# Patient Record
Sex: Female | Born: 1935 | Race: Black or African American | Hispanic: No | Smoking: Never smoker
Health system: Southern US, Community
[De-identification: ages and names within clinical notes are randomized; demographics above are authoritative.]

## PROBLEM LIST (undated history)

## (undated) ENCOUNTER — Emergency Department (HOSPITAL_COMMUNITY): Discharge status: Against Medical Advice | Payer: PRIVATE HEALTH INSURANCE

## (undated) DIAGNOSIS — I10 Essential (primary) hypertension: Secondary | ICD-10-CM

## (undated) DIAGNOSIS — I82409 Acute embolism and thrombosis of unspecified deep veins of unspecified lower extremity: Secondary | ICD-10-CM

## (undated) DIAGNOSIS — D638 Anemia in other chronic diseases classified elsewhere: Secondary | ICD-10-CM

## (undated) DIAGNOSIS — F0390 Unspecified dementia without behavioral disturbance: Secondary | ICD-10-CM

## (undated) DIAGNOSIS — Z992 Dependence on renal dialysis: Secondary | ICD-10-CM

## (undated) DIAGNOSIS — M109 Gout, unspecified: Secondary | ICD-10-CM

## (undated) DIAGNOSIS — J45909 Unspecified asthma, uncomplicated: Secondary | ICD-10-CM

## (undated) DIAGNOSIS — M1990 Unspecified osteoarthritis, unspecified site: Secondary | ICD-10-CM

## (undated) DIAGNOSIS — E785 Hyperlipidemia, unspecified: Secondary | ICD-10-CM

## (undated) DIAGNOSIS — N186 End stage renal disease: Secondary | ICD-10-CM

## (undated) HISTORY — PX: AV FISTULA PLACEMENT: SHX1204

## (undated) HISTORY — DX: Essential (primary) hypertension: I10

## (undated) HISTORY — PX: CATARACT EXTRACTION W/ INTRAOCULAR LENS  IMPLANT, BILATERAL: SHX1307

## (undated) HISTORY — PX: TUBAL LIGATION: SHX77

## (undated) HISTORY — DX: Hyperlipidemia, unspecified: E78.5

---

## 1994-02-08 DIAGNOSIS — I517 Cardiomegaly: Secondary | ICD-10-CM

## 2000-06-27 ENCOUNTER — Emergency Department (HOSPITAL_COMMUNITY): Admission: EM | Admit: 2000-06-27 | Discharge: 2000-06-27 | Payer: Self-pay | Admitting: Emergency Medicine

## 2001-03-27 ENCOUNTER — Encounter: Payer: Self-pay | Admitting: Family Medicine

## 2001-03-27 ENCOUNTER — Ambulatory Visit (HOSPITAL_COMMUNITY): Admission: RE | Admit: 2001-03-27 | Discharge: 2001-03-27 | Payer: Self-pay | Admitting: Family Medicine

## 2001-03-30 ENCOUNTER — Ambulatory Visit (HOSPITAL_COMMUNITY): Admission: RE | Admit: 2001-03-30 | Discharge: 2001-03-30 | Payer: Self-pay | Admitting: Family Medicine

## 2001-04-24 ENCOUNTER — Other Ambulatory Visit: Admission: RE | Admit: 2001-04-24 | Discharge: 2001-04-24 | Payer: Self-pay | Admitting: Family Medicine

## 2001-11-02 ENCOUNTER — Emergency Department (HOSPITAL_COMMUNITY): Admission: EM | Admit: 2001-11-02 | Discharge: 2001-11-02 | Payer: Self-pay | Admitting: Emergency Medicine

## 2002-10-15 ENCOUNTER — Encounter: Payer: Self-pay | Admitting: Nephrology

## 2002-10-15 ENCOUNTER — Encounter: Admission: RE | Admit: 2002-10-15 | Discharge: 2002-10-15 | Payer: Self-pay | Admitting: Nephrology

## 2002-11-09 ENCOUNTER — Encounter: Payer: Self-pay | Admitting: Emergency Medicine

## 2002-11-10 ENCOUNTER — Inpatient Hospital Stay (HOSPITAL_COMMUNITY): Admission: EM | Admit: 2002-11-10 | Discharge: 2002-11-12 | Payer: Self-pay | Admitting: Emergency Medicine

## 2002-11-10 ENCOUNTER — Encounter: Payer: Self-pay | Admitting: Cardiology

## 2003-09-29 ENCOUNTER — Encounter (HOSPITAL_COMMUNITY): Admission: RE | Admit: 2003-09-29 | Discharge: 2003-12-28 | Payer: Self-pay | Admitting: Nephrology

## 2004-01-02 ENCOUNTER — Encounter (HOSPITAL_COMMUNITY): Admission: RE | Admit: 2004-01-02 | Discharge: 2004-04-01 | Payer: Self-pay | Admitting: Nephrology

## 2004-04-09 ENCOUNTER — Encounter (HOSPITAL_COMMUNITY): Admission: RE | Admit: 2004-04-09 | Discharge: 2004-07-08 | Payer: Self-pay | Admitting: Nephrology

## 2004-08-13 ENCOUNTER — Encounter (HOSPITAL_COMMUNITY): Admission: RE | Admit: 2004-08-13 | Discharge: 2004-11-11 | Payer: Self-pay | Admitting: Nephrology

## 2004-10-27 ENCOUNTER — Ambulatory Visit: Payer: Self-pay | Admitting: Family Medicine

## 2004-11-19 ENCOUNTER — Encounter (HOSPITAL_COMMUNITY): Admission: RE | Admit: 2004-11-19 | Discharge: 2005-02-17 | Payer: Self-pay | Admitting: Nephrology

## 2005-02-18 ENCOUNTER — Encounter: Admission: RE | Admit: 2005-02-18 | Discharge: 2005-05-19 | Payer: Self-pay | Admitting: Nephrology

## 2005-05-24 ENCOUNTER — Encounter (HOSPITAL_COMMUNITY): Admission: RE | Admit: 2005-05-24 | Discharge: 2005-08-22 | Payer: Self-pay | Admitting: Nephrology

## 2005-07-01 ENCOUNTER — Ambulatory Visit: Payer: Self-pay | Admitting: Family Medicine

## 2005-08-01 ENCOUNTER — Ambulatory Visit (HOSPITAL_COMMUNITY): Admission: RE | Admit: 2005-08-01 | Discharge: 2005-08-01 | Payer: Self-pay | Admitting: Internal Medicine

## 2005-11-04 ENCOUNTER — Encounter (HOSPITAL_COMMUNITY): Admission: RE | Admit: 2005-11-04 | Discharge: 2006-02-02 | Payer: Self-pay | Admitting: Nephrology

## 2006-01-02 ENCOUNTER — Ambulatory Visit: Payer: Self-pay | Admitting: Internal Medicine

## 2006-02-17 ENCOUNTER — Encounter (HOSPITAL_COMMUNITY): Admission: RE | Admit: 2006-02-17 | Discharge: 2006-03-29 | Payer: Self-pay | Admitting: Nephrology

## 2006-03-02 ENCOUNTER — Ambulatory Visit: Payer: Self-pay | Admitting: Family Medicine

## 2006-03-08 ENCOUNTER — Ambulatory Visit: Payer: Self-pay | Admitting: Family Medicine

## 2006-04-05 ENCOUNTER — Ambulatory Visit: Payer: Self-pay | Admitting: Internal Medicine

## 2006-04-13 ENCOUNTER — Ambulatory Visit: Payer: Self-pay | Admitting: Family Medicine

## 2006-04-14 ENCOUNTER — Encounter (HOSPITAL_COMMUNITY): Admission: RE | Admit: 2006-04-14 | Discharge: 2006-07-13 | Payer: Self-pay | Admitting: Nephrology

## 2006-04-17 ENCOUNTER — Encounter (INDEPENDENT_AMBULATORY_CARE_PROVIDER_SITE_OTHER): Payer: Self-pay | Admitting: Specialist

## 2006-04-17 ENCOUNTER — Inpatient Hospital Stay (HOSPITAL_COMMUNITY): Admission: EM | Admit: 2006-04-17 | Discharge: 2006-04-20 | Payer: Self-pay | Admitting: Emergency Medicine

## 2006-04-17 ENCOUNTER — Ambulatory Visit: Payer: Self-pay | Admitting: Hospitalist

## 2006-04-18 ENCOUNTER — Encounter (INDEPENDENT_AMBULATORY_CARE_PROVIDER_SITE_OTHER): Payer: Self-pay | Admitting: *Deleted

## 2006-05-08 ENCOUNTER — Ambulatory Visit: Payer: Self-pay | Admitting: Family Medicine

## 2006-06-12 ENCOUNTER — Ambulatory Visit: Payer: Self-pay | Admitting: Family Medicine

## 2006-06-20 ENCOUNTER — Ambulatory Visit: Payer: Self-pay | Admitting: Family Medicine

## 2006-07-17 ENCOUNTER — Ambulatory Visit: Payer: Self-pay | Admitting: Family Medicine

## 2006-07-21 ENCOUNTER — Encounter (HOSPITAL_COMMUNITY): Admission: RE | Admit: 2006-07-21 | Discharge: 2006-10-19 | Payer: Self-pay | Admitting: Nephrology

## 2006-09-04 ENCOUNTER — Ambulatory Visit: Payer: Self-pay | Admitting: Vascular Surgery

## 2006-09-10 ENCOUNTER — Inpatient Hospital Stay (HOSPITAL_COMMUNITY): Admission: EM | Admit: 2006-09-10 | Discharge: 2006-09-15 | Payer: Self-pay | Admitting: Emergency Medicine

## 2006-11-07 ENCOUNTER — Encounter (HOSPITAL_COMMUNITY): Admission: RE | Admit: 2006-11-07 | Discharge: 2007-02-05 | Payer: Self-pay | Admitting: Nephrology

## 2006-11-18 DIAGNOSIS — J45901 Unspecified asthma with (acute) exacerbation: Secondary | ICD-10-CM | POA: Insufficient documentation

## 2006-11-18 DIAGNOSIS — N19 Unspecified kidney failure: Secondary | ICD-10-CM | POA: Insufficient documentation

## 2006-11-18 DIAGNOSIS — M109 Gout, unspecified: Secondary | ICD-10-CM

## 2006-11-18 DIAGNOSIS — I10 Essential (primary) hypertension: Secondary | ICD-10-CM

## 2006-11-18 DIAGNOSIS — D649 Anemia, unspecified: Secondary | ICD-10-CM | POA: Insufficient documentation

## 2006-11-18 DIAGNOSIS — K5730 Diverticulosis of large intestine without perforation or abscess without bleeding: Secondary | ICD-10-CM | POA: Insufficient documentation

## 2006-11-28 ENCOUNTER — Ambulatory Visit (HOSPITAL_COMMUNITY): Admission: RE | Admit: 2006-11-28 | Discharge: 2006-11-28 | Payer: Self-pay | Admitting: Vascular Surgery

## 2006-11-28 ENCOUNTER — Ambulatory Visit: Payer: Self-pay | Admitting: Vascular Surgery

## 2007-01-08 ENCOUNTER — Ambulatory Visit: Payer: Self-pay | Admitting: Vascular Surgery

## 2007-03-02 ENCOUNTER — Encounter (HOSPITAL_COMMUNITY): Admission: RE | Admit: 2007-03-02 | Discharge: 2007-05-16 | Payer: Self-pay | Admitting: Nephrology

## 2007-03-19 ENCOUNTER — Encounter (INDEPENDENT_AMBULATORY_CARE_PROVIDER_SITE_OTHER): Payer: Self-pay | Admitting: Family Medicine

## 2007-04-30 ENCOUNTER — Encounter (INDEPENDENT_AMBULATORY_CARE_PROVIDER_SITE_OTHER): Payer: Self-pay | Admitting: Family Medicine

## 2007-05-21 ENCOUNTER — Ambulatory Visit: Payer: Self-pay | Admitting: Family Medicine

## 2007-05-21 DIAGNOSIS — K219 Gastro-esophageal reflux disease without esophagitis: Secondary | ICD-10-CM

## 2007-07-02 ENCOUNTER — Encounter (INDEPENDENT_AMBULATORY_CARE_PROVIDER_SITE_OTHER): Payer: Self-pay | Admitting: Family Medicine

## 2007-07-13 ENCOUNTER — Telehealth (INDEPENDENT_AMBULATORY_CARE_PROVIDER_SITE_OTHER): Payer: Self-pay | Admitting: Internal Medicine

## 2007-07-16 ENCOUNTER — Telehealth (INDEPENDENT_AMBULATORY_CARE_PROVIDER_SITE_OTHER): Payer: Self-pay | Admitting: Family Medicine

## 2007-07-18 ENCOUNTER — Ambulatory Visit: Payer: Self-pay | Admitting: Family Medicine

## 2007-07-18 DIAGNOSIS — J040 Acute laryngitis: Secondary | ICD-10-CM | POA: Insufficient documentation

## 2007-09-07 ENCOUNTER — Ambulatory Visit: Payer: Self-pay | Admitting: Vascular Surgery

## 2007-11-21 ENCOUNTER — Ambulatory Visit: Payer: Self-pay | Admitting: Family Medicine

## 2007-11-21 DIAGNOSIS — J309 Allergic rhinitis, unspecified: Secondary | ICD-10-CM | POA: Insufficient documentation

## 2008-02-26 ENCOUNTER — Encounter: Admission: RE | Admit: 2008-02-26 | Discharge: 2008-02-26 | Payer: Self-pay | Admitting: Nephrology

## 2008-04-29 ENCOUNTER — Ambulatory Visit: Payer: Self-pay | Admitting: Family Medicine

## 2008-09-08 ENCOUNTER — Ambulatory Visit: Payer: Self-pay | Admitting: Family Medicine

## 2008-09-11 ENCOUNTER — Encounter (INDEPENDENT_AMBULATORY_CARE_PROVIDER_SITE_OTHER): Payer: Self-pay | Admitting: Family Medicine

## 2008-09-25 ENCOUNTER — Encounter (HOSPITAL_COMMUNITY): Admission: RE | Admit: 2008-09-25 | Discharge: 2008-12-24 | Payer: Self-pay | Admitting: Nephrology

## 2008-10-09 ENCOUNTER — Ambulatory Visit: Payer: Self-pay | Admitting: Vascular Surgery

## 2008-10-14 ENCOUNTER — Ambulatory Visit (HOSPITAL_COMMUNITY): Admission: RE | Admit: 2008-10-14 | Discharge: 2008-10-14 | Payer: Self-pay | Admitting: Vascular Surgery

## 2008-10-14 ENCOUNTER — Ambulatory Visit: Payer: Self-pay | Admitting: Vascular Surgery

## 2009-02-04 ENCOUNTER — Encounter (INDEPENDENT_AMBULATORY_CARE_PROVIDER_SITE_OTHER): Payer: Self-pay | Admitting: Internal Medicine

## 2009-02-27 ENCOUNTER — Encounter (HOSPITAL_COMMUNITY): Admission: RE | Admit: 2009-02-27 | Discharge: 2009-05-28 | Payer: Self-pay | Admitting: Nephrology

## 2009-03-23 ENCOUNTER — Encounter: Payer: Self-pay | Admitting: Physician Assistant

## 2009-05-01 DIAGNOSIS — E785 Hyperlipidemia, unspecified: Secondary | ICD-10-CM

## 2009-05-18 ENCOUNTER — Encounter (HOSPITAL_COMMUNITY): Admission: RE | Admit: 2009-05-18 | Discharge: 2009-05-19 | Payer: Self-pay | Admitting: Nephrology

## 2009-06-11 ENCOUNTER — Ambulatory Visit: Payer: Self-pay | Admitting: Vascular Surgery

## 2009-07-15 ENCOUNTER — Ambulatory Visit: Payer: Self-pay | Admitting: Vascular Surgery

## 2010-01-15 ENCOUNTER — Encounter: Payer: Self-pay | Admitting: Physician Assistant

## 2010-05-07 ENCOUNTER — Ambulatory Visit: Payer: Self-pay | Admitting: Vascular Surgery

## 2010-08-01 ENCOUNTER — Encounter: Payer: Self-pay | Admitting: Occupational Therapy

## 2010-08-01 ENCOUNTER — Encounter: Payer: Self-pay | Admitting: Family Medicine

## 2010-08-12 NOTE — Letter (Signed)
Summary: MAILED REQUESTED RECORDS TO [facility]  MAILED REQUESTED RECORDS TO [facility]   Imported By: Arta Bruce 01/15/2010 12:13:45  _____________________________________________________________________  External Attachment:    Type:   Image     Comment:   External Document

## 2010-08-12 NOTE — Letter (Signed)
Summary: [facility]  [facility]   Imported By: Arta Bruce 07/19/2010 16:39:47  _____________________________________________________________________  External Attachment:    Type:   Image     Comment:   External Document

## 2010-10-19 LAB — FERRITIN: Ferritin: 337 ng/mL — ABNORMAL HIGH (ref 10–291)

## 2010-10-19 LAB — POCT HEMOGLOBIN-HEMACUE: Hemoglobin: 11.4 g/dL — ABNORMAL LOW (ref 12.0–15.0)

## 2010-10-19 LAB — IRON AND TIBC: Iron: 58 ug/dL (ref 42–135)

## 2010-10-20 LAB — POCT I-STAT 4, (NA,K, GLUC, HGB,HCT)
Glucose, Bld: 91 mg/dL (ref 70–99)
Potassium: 3.7 mEq/L (ref 3.5–5.1)

## 2010-10-21 LAB — FERRITIN: Ferritin: 466 ng/mL — ABNORMAL HIGH (ref 10–291)

## 2010-10-21 LAB — POCT HEMOGLOBIN-HEMACUE: Hemoglobin: 10.7 g/dL — ABNORMAL LOW (ref 12.0–15.0)

## 2010-10-21 LAB — IRON AND TIBC: UIBC: 163 ug/dL

## 2010-11-11 ENCOUNTER — Ambulatory Visit (INDEPENDENT_AMBULATORY_CARE_PROVIDER_SITE_OTHER): Payer: PRIVATE HEALTH INSURANCE | Admitting: Vascular Surgery

## 2010-11-11 DIAGNOSIS — N186 End stage renal disease: Secondary | ICD-10-CM

## 2010-11-12 NOTE — Assessment & Plan Note (Signed)
OFFICE VISIT  [name], [name] DOB:  05-27-1933                                       11/11/2010 [id number]#:[id number]  CHIEF COMPLAINT:  Tortuosity of left upper arm AV fistula.  HISTORY OF PRESENT ILLNESS:  The patient is a 77 year old female who initially had a left brachiocephalic AV fistula placed by Dr. Arbie Cookey in May of 2008.  She has had side branches ligated on this in April of 2010.  She currently is not on dialysis.  She was recently seen by Dr. Arrie Aran and he wanted further evaluation of the fistula due to its tortuosity.  The patient denies any numbness or tingling in her hand.  REVIEW OF SYSTEMS:  She has no itchiness in her skin suggesting uremia. PULMONARY:  She has no shortness of breath but does have occasional wheezing. CARDIAC:  She denies any chest pain.  PHYSICAL EXAM:  Blood pressure is 144/80 in the right arm, heart rate 67 and regular, oxygen saturation is 98% on room air.  Left upper arm, there is an easily palpable thrill and audible bruit in the fistula.  It does have some tortuosity to it.  The fistula is approximately 10-12 mm in diameter.  Left hand is pink, warm and well-perfused.  In summary, the patient has a mature left brachiocephalic AV fistula. At this point it does have some tortuosity to it.  However, I do not believe that this should prevent them from cannulating the fistula.  It should be ready for use at any time.  She will follow up on an as-needed basis.  If the tortuosity is a problem there is not really any procedure we can do to straighten the fistula.  We would have to consider a different access.  However, I believe this should be a durable access for her.    Janetta Hora. Ashni Lonzo, MD Electronically Signed  CEF/MEDQ  D:  11/11/2010  T:  11/12/2010  Job:  [id number]  cc:   Terrial Rhodes, M.D.

## 2010-11-23 NOTE — Procedures (Signed)
VASCULAR LAB EXAM   INDICATION:  Evaluate stenosis versus curve in left AVF.   HISTORY:  Diabetes:  No  Cardiac:  No  Hypertension:  Yes   EXAM:  Duplex left upper extremity AVF.   See attached worksheet for all measurements.   IMPRESSION:  1. Patent arteriovenous fistula with elevated velocities of 633 cm/s.      at the anastomosis.  2. Diameter measurements in the cephalic vein change from 0.49 cm near      anastomosis to 1.28 cm slightly further from the anastomosis.  3. Cephalic vein appears somewhat tortuous.   Results were discussed with Dr. Edilia Bo and an appointment scheduled to  see Dr. Darrick Penna.   ___________________________________________  Di Kindle. Edilia Bo, M.D.   AS/MEDQ  D:  06/11/2009  T:  06/11/2009  Job:  [id number]

## 2010-11-23 NOTE — Assessment & Plan Note (Signed)
OFFICE VISIT   [name], [name]  DOB:  October 12, 1932                                       10/09/2008  HWEXH#:[id number]   The patient is a 78 year old female who had a left brachiocephalic AV  fistula placed by Dr. Arbie Cookey in May 2008.  She is currently not on  hemodialysis but is approaching need for that.  She is sent by Dr.  Arrie Aran for evaluation of her left AV fistula and consideration for  ligation of a competing branch.   The fistula has never been used previously.  On exam today blood  pressure 143/77 in the right arm, pulse is 65 and regular.  Left upper  extremity shows an easily palpable thrill in the fistula.  There is a  large competing side branch in the proximal third, and this branch is  visible extending down the left forearm.  On compression of the branch,  the thrill diminishes slightly in the fistula and becomes slightly more  pulsatile.  This large collateral may have developed from some disease  within the vein itself.  However, this was not visualized on duplex  ultrasound today.  I believe the best option for her is to ligate this  branch and hopefully the fistula will continue to mature and be ready  for use if she needs it.  However, it could be possible that if we  ligate this branch she could end up losing the fistula.  Since it is not  adequate at this point for use for cannulation, I believe the best  option is to ligate the branch and we will see what happens.  This is  scheduled for October 14, 2008.   Janetta Hora. Fields, MD  Electronically Signed   CEF/MEDQ  D:  10/09/2008  T:  10/13/2008  Job:  [id number]   cc:   Terrial Rhodes, M.D.

## 2010-11-23 NOTE — Op Note (Signed)
[name], [name]              ACCOUNT NO.:  [id number]   MEDICAL RECORD NO.:  [id number]          PATIENT TYPE:  AMB   LOCATION:  SDS                          FACILITY:  [facility]   PHYSICIAN:  Larina Earthly, M.D.    DATE OF BIRTH:  16-Jan-1933   DATE OF PROCEDURE:  11/28/2006  DATE OF DISCHARGE:                               OPERATIVE REPORT   PREOPERATIVE DIAGNOSIS:  Renal insufficiency.   POSTOPERATIVE DIAGNOSIS:  Renal insufficiency.   PROCEDURE:  Creation of left upper arm AV fistula.   SURGEON:  Larina Earthly, M.D.   ASSISTANT:  Nurse.   ANESTHESIA:  MAC.   COMPLICATIONS:  None.   DISPOSITION:  Recovery room stable.   PROCEDURE IN DETAIL:  The patient was taken operating room placed supine  position where the area the left arm prepped and draped usual sterile  fashion.  Incision made over the antecubital space, carried down to  isolate the cephalic vein which was of good caliber.  The brachial  artery was also good caliber.  Cephalic vein was ligated distally,  divided, mobilized level of the brachial artery.  The brachial artery  was occluded proximally and distally, opened with 11 blade extended  longitudinally with Potts scissors.  The vein was brought into  approximation brachial artery.  The artery was opened with 11 blade  extended longitudinally with Potts scissors.  The vein was divided  spatulated sewn end-to-side the artery with running 6-0 Prolene suture.  Clamps removed.  Good thrill was noted.  Wound was irrigated with  saline, hemostasis electrocautery.  Wound was closed 3-0 Vicryl in  subcutaneous subcuticular tissue.  Benzoin and Steri-Strips were  applied.      Larina Earthly, M.D.  Electronically Signed     TFE/MEDQ  D:  11/28/2006  T:  11/28/2006  Job:  [id number]

## 2010-11-23 NOTE — Assessment & Plan Note (Signed)
OFFICE VISIT   SERRA, YOUNAN  DOB:  05-06-1933                                       09/07/2007  QIONG#:[id number]   The patient presents today for followup of her left upper arm AV  fistula.  She had upper arm AV fistula creation by me on 11/28/2006.  She is still not on hemodialysis.  She reported a tingling feeling in  her upper arm and Dr. Arrie Aran has referred her for evaluation.  She  has no steal symptoms.  Her hand is well-perfused and she does not have  any aching or numbness in her left hand.  Her renal function, according  to the patient, does remain stable, and she is not approaching need for  dialysis.   PHYSICAL EXAM:  Well-developed, well-nourished black female appearing  stated age of 48.  Blood pressure is 145/76, pulse 78, respirations 18.  Her radial pulse on the left is 2+.  She does have a nicely maturing  left upper arm AV fistula.  This is quite large at the antecubital space  and is large up to the upper arm.  It does run slightly deep in the  proximal upper arm, and has some tortuosity in the mid upper arm.  This  does appear that it will be quite accessible for access if she  approaches hemodialysis.  She does have 1 prominent tributary branch  over the radial aspect of her left forearm below the antecubital space.  I explained that this would be typical and does not appear to be causing  any competitive flow.  She was reassured with this discussion and will  see [place] again on an as needed basis.   Larina Earthly, M.D.  Electronically Signed   TFE/MEDQ  D:  09/07/2007  T:  09/10/2007  Job:  [id number]   cc:   Terrial Rhodes, M.D.

## 2010-11-23 NOTE — Procedures (Signed)
VASCULAR LAB EXAM   INDICATION:  Not maturing AV fistula, history of renal insufficiency.   HISTORY:  Diabetes:  Cardiac:  Yes.  Hypertension:  Yes.   EXAM:  Duplex of the left arm AV fistula.   IMPRESSION:  Patent left arm AV fistula with a competing branch noted at  the proximal AV fistula.   ___________________________________________  Janetta Hora. Fields, MD   MG/MEDQ  D:  10/09/2008  T:  10/09/2008  Job:  [id number]

## 2010-11-23 NOTE — Assessment & Plan Note (Signed)
OFFICE VISIT   [name], [name]  DOB:  28-Oct-1932                                       01/08/2007  QQVZD#:[id number]   The patient presents today for followup of her left upper arm AV fistula  creation on 11/28/2006.  Her incision has healed quite nicely and she  has an excellent thrill and good early maturation of her upper arm  cephalic vein.  She does report some fatigue in her hand with a great  deal of use of this, but does not appear to be a severe steal.  She will  monitor this and let us know if she has any progressive problems.  I am  quite pleased with her early result after AV fistula creation.  She is  to continue to be monitored by Dr. Arrie Aran for her renal  insufficiency.  Hopefully, we will have an adequate maturation if and  when time comes for hemodialysis.  She will see [place] on a p.r.n. basis.   Larina Earthly, M.D.  Electronically Signed   TFE/MEDQ  D:  01/08/2007  T:  01/09/2007  Job:  [id number]   cc:   Terrial Rhodes, M.D.

## 2010-11-23 NOTE — Assessment & Plan Note (Signed)
OFFICE VISIT   SHELIAH, FIORILLO  DOB:  07-19-1932                                       07/15/2009  FAOZH#:[id number]   The patient is a 77 year old female referred by Dr. Arrie Aran for  evaluation of a left upper arm AV fistula for possible stenosis.  The  patient is currently not on dialysis.  She originally had the fistula  placed in May of 2008.  She underwent a branch ligation of this fistula  for competing branches in April of 2010.  She states she has an  occasional episode of numbness in her thumb but with rubbing her hand  this resolves.  She otherwise reports no episodes of steal.  She states  that she can hear audible thrill in it when she is sleeping at night.   CHRONIC MEDICAL PROBLEMS:  Include hypertension and elevated cholesterol  both of which are controlled and followed by Dr. Arrie Aran.   REVIEW OF SYSTEMS:  Remarkable for occasional episodes of asthma and her  renal dysfunction.   PHYSICAL EXAM:  Blood pressure is 132/79 in the right arm, heart rate is  73 and regular.  Temperature is 97.8.  Left upper extremity reveals a  well-healed antecubital incision.  There is a palpable thrill in the  left upper arm fistula.  The fistula is quite tortuous but has dilated  up significantly.  The branch ligation sites are all well-healed as  well.   She had a duplex ultrasound performed on December 2 which showed a  velocity of 633 cm/s across the anastomosis, however, the thrill across  this is easily palpable and this is most likely due to high flow through  the fistula rather than a narrowing.  The diameter of the fistula in the  area that would be used for cannulation is between 1.2 cm and 1 cm in  diameter.   Currently the patient is not on hemodialysis.  After branch ligation the  fistula has increased in size and is now essentially 1 cm or greater in  diameter throughout its course.  There is no obvious evidence of mid  fistula or  distal fistula narrowing by duplex.  There was increased  velocity proximally, however, I do not believe that this represents a  narrowing based on her clinical exam.  She is currently not on dialysis.  She will follow up on an as-needed basis.     Janetta Hora. Fields, MD  Electronically Signed   CEF/MEDQ  D:  07/15/2009  T:  07/16/2009  Job:  [id number]   cc:   Terrial Rhodes, M.D.

## 2010-11-23 NOTE — Op Note (Signed)
NAMEBLESSEN, [name]              ACCOUNT NO.:  [id number]   MEDICAL RECORD NO.:  [id number]          PATIENT TYPE:  AMB   LOCATION:  SDS                          FACILITY:  [facility]   PHYSICIAN:  Charles E. Fields, MD  DATE OF BIRTH:  Nov 14, 1932   DATE OF PROCEDURE:  10/14/2008  DATE OF DISCHARGE:  10/14/2008                               OPERATIVE REPORT   PROCEDURE:  Ligation of competing branches, left upper arm arteriovenous  fistula.   PREOPERATIVE DIAGNOSIS:  Nonmaturing arteriovenous fistula, left arm.   POSTOPERATIVE DIAGNOSIS:  Nonmaturing arteriovenous fistula, left arm.   ANESTHESIA:  Local with IV sedation.   ASSISTANT:  Nurse.   OPERATIVE FINDINGS:  Ligation of 2 competing branches, one in the  proximal third of the fistula and one in the middle third of the  fistula.   OPERATIVE DETAILS:  After obtaining informed consent, the patient was  taken to the operating room.  The patient was placed in supine position  on the operating room table.  After adequate sedation, the patient's  entire left upper extremity was prepped and draped in usual sterile  fashion.  Ultrasound was used to determine the precise location of 2  competing branches.  One of these was in the proximal third of the  fistula and one was in the middle third of the fistula.  These were  marked for identification.  Local anesthesia was then infiltrated over  the area of both of these.  A longitudinal incision was made in this  location and carried down through subcutaneous tissues and the side  branch was dissected free circumferentially.  This was then ligated and  between silk ties.  In the upper arm, middle third of the fistula, an  additional longitudinal incision was made and carried down through the  subcutaneous tissues down to level of the fistula.  Side branch was  dissected free circumferentially and ligated and divided between two 2-0  silk ties.  Wounds were thoroughly irrigated with normal  saline  solution.  Subcutaneous tissues were reapproximated and skin  reapproximated using a running 4-0 Vicryl subcuticular stitch.  Benzoin  and Steri-Strips were applied.  The patient tolerated the procedure  well, and there were no complications.  Instrument, sponge, and needle  counts were correct at the end of the case.  The patient had palpable  thrill in the fistula at the end of the case.      Janetta Hora. Fields, MD  Electronically Signed     CEF/MEDQ  D:  10/14/2008  T:  10/15/2008  Job:  [id number]

## 2010-11-23 NOTE — Procedures (Signed)
VASCULAR LAB EXAM   INDICATION:  Duplex of AV fistula, rule out venous outflow stenosis.   HISTORY:  Diabetes:  No.  Cardiac:  No.  Hypertension:  Yes.   EXAM:  Left upper extremity AV fistula duplex.   IMPRESSION:  1. Patent left brachial cephalic arteriovenous fistula with a velocity      of 656 cm/sec noted in the antecubital fossa level of the outflow      vein.  This increase in velocity appears to be due to the angle of      vessel takeoff.  2. Marked tortuosity of the left outflow vein with diffuse mild      chronic post-thrombotic scarring noted in the proximal to distal      brachium level outflow vein.  3. 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  The left radial artery is      antegrade.  4. No significant change in the Doppler velocities noted when compared      to the previous exam on 06/11/2009.   ___________________________________________  Larina Earthly, M.D.   CH/MEDQ  D:  05/10/2010  T:  05/10/2010  Job:  [id number]

## 2010-11-26 NOTE — Discharge Summary (Signed)
[name], [name]              ACCOUNT NO.:  [id number]   MEDICAL RECORD NO.:  [id number]          PATIENT TYPE:  INP   LOCATION:  6708                         FACILITY:  [facility]   PHYSICIAN:  Thereasa Solo, M.D.  DATE OF BIRTH:  09-02-1932   DATE OF ADMISSION:  04/17/2006  DATE OF DISCHARGE:  04/20/2006                                 DISCHARGE SUMMARY   DISCHARGE DIAGNOSES:  1. Acute on chronic renal failure - likely secondary to hypotension, with      ACE inhibitor.  2. Hypertension.  3. Anemia of chronic disease.  4. Chronic renal insufficiency.  5. Status post tubal ligation ETO 1973.  6. Status post cath in 2004 showed no significant coronary artery disease      with EF of 45-50%.   DISCHARGE MEDICATIONS:  1. Hectoral 1 mg p.o. q. a.m.  2. Tramadol 50 mg p.o. b.i.d. p.r.n. pain.  3. Prilosec 20 mg every day p.o.  4. Phenergan 25 mg p.o. b.i.d. p.r.n. nausea.  5. Albuterol inhaler 1-2 puffs p.r.n. dyspnea.   FOLLOWUP APPOINTMENTS:  1. With primary care Deepak Bless Dr. Luciana Axe at [facility].  The patient is      to follow up October 29th at 9:45 a.m.  2. With Dr. Arrie Aran at [facility], to follow up October 18th at      3:45 p.m.  On these follow up visits, please check a BMET and CBC.  The patient had  some marked creatinine increase during this admission, which was most likely  due to hypertension in the face of an ACE inhibitor.  Please also check  blood pressure to determine if it is stable at this time.  Please also check  hemoglobin.  The patient has some anemia of chronic disease.   PROCEDURES PERFORMED:  1. On October 8th, 2007, there was an ultrasound of the kidneys performed,      which showed:      a.     multiple bilateral renal cyst with hyperechoic kidneys.  If this       patient is on dialysis, this may represent change of dialysis.      b.     No hydronephrosis.  2. X-ray of bilateral feet taken October 8th, 2007.      a.     Mild soft tissue  prominence of both feet, without focal       collection to suggest abscess.  This may represent lower extremity       edema.      b.     Small bilateral plantar calcaneal spurs.  3. Cytology examination of urine for eosinophils.  No eosinophils were      noted.  4. October 9th, 2007 - 2D echocardiogram.  Summary:      a.     Overall left ventricular systolic function was normal.  Although       no diagnostic left ventricular regional wall motion abnormality was       identified, this possibly cannot be clearly excluded on the basis of       this study.  b.     The aortic valve was mildly calcified.      c.     There was mild-to-moderate pulmonic regurgitation.  (There was       no ejection fraction reported on this echocardiogram report.)   CONSULTATIONS:   Kidney with Dr. Arrie Aran and Dr. Darrick Penna.   ADMITTING HMP:  This is a 77 year old female with past medical history  significant for hypertension, anemia, and chronic renal insufficiency  presents to the Emergency Department complaining of bilateral foot pain,  with the left greater than right for the past 2 weeks.  The pain was  immediate onset upon awakening 2 weeks prior to admission, with worsening  pain.  The patient has been seen by her PCP, who prescribed a gout medicine  (which was later determined to be allopurinol), which did not seem to  decrease the patient's pain over the 3 days she took the pill.  The patient  is ambulating with a cane, which is normal for her, but she is having some  difficulty due to the bilateral feet pain.  The patient has also noticed  this pain before, 2 years prior, which self-resolved after 2 days.   The patient has also noticed some increased edema in her bilateral feet that  began 2 weeks prior to arrival and has improved slightly, but still worse  than baseline.  Some friends told her to decrease her fluid intake, which  would help her get rid of some of the fluid on her  lower extremities, and  she has decreased her fluid intake over the past 2 weeks.   No chest pain or shortness of breath.  No fever or chills.  No nausea,  vomiting, diarrhea, constipation.  No history of trauma.  No dysuria.  No  change in urination, except a slight decrease in frequency.   ALLERGIES:  NO KNOWN DRUG ALLERGIES.   HOME MEDICATIONS:  1. Tramadol 50 mg p.o. every 8 p.r.n.  2. Hectoral 1 mg p.o. q. a.m.  3. Prilosec 20 mg p.o. every day.  4. Phenergan 25 mg p.o. every 8 hours p.r.n.  5. Albuterol 1-2 puffs q.h.s. p.r.n.  6. Augmentin 875 mg every 8 hours for 10 days, which the patient claims      was for a upset stomach.  (This was later determined to be a possible      bout of diverticulosis.)  7. Epogen every month per renal.   SUBSTANCE HISTORY:  No smoking history.  No alcohol.  No cocaine.  No IV  drug use.   SOCIAL HISTORY:  The patient is divorced, with Medicaid.  Lives with her  daughter and 3 grandchildren here in [place].   FAMILY HISTORY:  The patient is unsure of family history, although there is  no history of heart disease, no history of cancer, no history of kidney  disease, per patient.   PHYSICAL EXAM:  VITAL SIGNS:  Temperature 97.4, blood pressure 82/56, pulse  95, respiratory 16, saturating 97% on room air.  GENERAL:  No acute distress.  The patient is comfortable and mentating  appropriating.  EYES:  Extraocular muscles intact.  Pupils equally round and reactive to  light and accommodation.  Sclerae anicteric.  ENT:  Oropharynx clear.  Moist mucous membranes.  NECK:  No JVD.  Full range of motion.  Supple.  RESPIRATORY:  Clear to auscultation bilaterally with good breath sounds.  No  rubs, rhonchi or wheezes.  CARDIOVASCULAR:  Regular rate and rhythm,  without murmur.  No JVD.  No  carotid bruits.  GI:  Soft, nontender, nondistended.  Positive bowel sounds.  No  hepatosplenomegaly. EXTREMITIES:  1+ edema bilaterally, 2+ pulses bilateral  low extremities.  Mild bruising on the dorsal surface of bilateral feet.  No warmth or  erythema noted on either of the first metatarsals.  SKIN:  No rash.  Skin is warm and dry.  LYMPH:  No supraclavicular or clavicular lymphadenopathy.  MUSCULOSKELETAL:  Full range of motion times all 4 extremities, except a  mild decrease in dorsiflexion on the left foot.  NEURO:  Cranial nerves II-XII clearly intact.  Sensation intact to light  touch in all 4 extremities, 5/5 strength in all 4 extremities.  Finger-to-  nose okay.  Gait untested due to low blood pressure.  PSYCH:  Alert and oriented to 3/3.  No suicidal or homicidal ideations.   LABS:  Sodium 136, potassium 4.0, chloride 108, BUN 82, creatinine 10.6,  glucose 82, white blood cell 8.3, hemoglobin 12.2, hematocrit 36, platelets  322.  Initial chest x-ray showed a mild cardiomegaly.  No edema.  Minor  bilateral basilar linear scarring/atelectasis.  Initial troponin was less  than 0.05.  UA:  Specific gravity 1.013, protein of 30, otherwise negative.  BNP 104, ferritin increased to 594, decreased iron at 41, decreased TIBC of  167.   PROBLEM:  1. Is the patient's chief complaint, bilateral foot pain, with the left      greater than right.  The pain has been lasting approximately 2 weeks      and worsening in severity without relief.  The patient was told by      primary care Reon Hunley that it may be gout.  Over the short duration of      medication that the patient did take, was not helpful in relieving the      patient's pain.  Her neuro exam is completely intact.  However,      musculoskeletal wise, there is some decreased range of motion in the      left foot as noted on physical exam.  There is no history of trauma,      and she does have 1+ edema in bilateral lower extremities.   We performed an x-ray of both of her feet and worked up edema on her as can  be seen on problem number 2.  For the x-ray of the feet, there was no   fracture or any other sign of trauma.  There was some signs of bone spurs,  which may be related to the patient's pain.   1. Elevated creatinine of 10.6.  Last known creatinine we have at time of      admission was 2.1, and another value of 2.5 on the last clinic visit      with [facility].  The patient is known to have a diagnosis of chronic      renal insufficiency due to the baseline creatinine in the mid to      superior.  She does Dr. Arrie Aran at [facility] and is followed      for this.  She does take Epogen once a month and is also on Hectoral      for secondary hypoparathyroidism.  We checked a FENA, urinalysis, urine      culture, ABG and BNP on this patient.  BNP was 98.  TSH 1.169.      Cortisol 11.7.  FENA was calculated to be 2.5.  There was also an      increased uric acid noted at 8.4.  At this point on admission, we are      considering several different options in our diagnosis.  However, the     most likely was a combination of the hypovolemia/hypotension, as      observed by the patient on admission, in combination with a recent use      of an ACE inhibitor, benazepril.  The combination of these 2 may have      caused an acute renal failure or acute tubal necrosis in this patient.      We also considered several other factors, even including AIN, acute      interstitial nephritis secondary to beta lactones due to the fact that      the patient was recently placed on amoxicillin for possible      diverticulosis.  We do not see any other causes as likely as these at      this time.   For treatment, we decided to hydrate the patient gently, after we found out  that the patient was not obstructed.  She did not have hydronephrosis  because we had considered the possibility of obstruction.  Therefore, we  started the patient on 75 mL an hour of D5 with 3 ounce bicarb.  This was  continued for approximately 24 hours, while the patient was maintaining good  p.o.  intake.  By the end of the second day of admission, the patient's  creatinine had dropped subsequently from 10.6 to 8.4 to 6.4 to 4.7 on the  day of discharge.  She was taking good p.o. intake, drinking plenty of  fluids, and she was feeling much better.   Thanks to [facility] for helping to see this patient while she was in the  hospital.  There were issues brought up about possible need for dialysis in  the future, and these will need to be considered on future follow up  appointments.   1. Hypotension.  Blood pressure was 82/56 on admission.  This subsequently      rose to 88/53, while in the Emergency Department.  By the next morning,      her blood pressure was 94/58, and by the time of discharge, the      patient's blood pressure had gone up to 114/62.  The patient was seen      by her primary care Verginia Toohey 4 days prior to admission and found to be      hypotensive there also, with approximately 80/50 blood pressure.  At      this time, she was discontinued on her benazepril.  We decided to wait      for renal ultrasound before hydrating this patient, to make sure she      was not obstructed, and once this was cleared, we began adequate      hydration on this patient, which also helped with her underlying kidney      disease that we were treating as well as noticed in problem number 2.      The echo did not reveal any abnormalities; however, it was not reported      to have an ejection fraction on the dictation.  With the patient's ABG,      she was slightly acidotic; however, this was most likely due to the      impaired kidney function, and it resolved subsequently over the course  of her stay with adequate hydration.   We did place the patient on stress dose cortisone at 50 mg IV every 6, which  was decreased gradually over the patient's stay.  She did have cortisol  measured at 11.7, which was a consideration considering there was a past use of steroids, namely prednisone, for  the gout episode.  We did not consider  that the patient was in any kind of septic shock at the moment and did not  place her on any antibiotics.   1. Hemoglobin of 12.2 on admission.  The patient was known to receive      Epogen once a month from her kidney doctors.  We did check iron      studies, which showed a decreased iron and decreased total iron-binding      capacity, with decreased ferritin and normal MCV, which was consistent      with anemia of chronic disease, most likely secondary to the patient's      impaired renal function.  Her hemoglobin did decrease during the course      of this stay to 10.4, then 10.0 and then back up to 10.2 on the day of      discharge.  We decided to follow her hemoglobin throughout her stay,      and this could be assessed more appropriately with the kidney doctors      on follow up soon.   1. Increased CBGs.  The patient did have some increased blood glucose      during this stay, with some in the 160s and 180s.  However, we believed      this was secondary to steroids, mainly the hydrocortisone, which was      administered during this stay for a stress dose.  She does not have a      history of diabetes; however, this may be something to follow up with      as an outpatient to determine if the patient does have elevated blood      sugars on a normal basis.   DISCHARGE LABS AND VITALS:  Sodium 143, potassium 3.4, (this was repleted  prior to patient discharge), chloride 108, bicarb 24, BUN 64, creatinine  4.7, glucose 96, white blood cell 13.5, (increased due to steroids),  hemoglobin 10.3, hematocrit 30.5, platelets 332, micro albumin and urine was  2.24, ferritin 612, PTH 132.4, calcium 9.5, potassium 5.1.  Urine culture  was negative.  Blood culture - I show no growth no date at time of  discharge.  Echo can be seen as above.      Thereasa Solo, M.D.  Electronically Signed     AS/MEDQ  D:  04/20/2006  T:  04/20/2006  Job:   [id number]   cc:   Jillyn Hidden A. Rankin, M.D.  Terrial Rhodes, M.D.

## 2010-11-26 NOTE — Discharge Summary (Signed)
[name], DELISI              ACCOUNT NO.:  [id number]   MEDICAL RECORD NO.:  [id number]          PATIENT TYPE:  INP   LOCATION:  6733                         FACILITY:  [facility]   PHYSICIAN:  Altha Harm, MDDATE OF BIRTH:  21-May-1933   DATE OF ADMISSION:  09/10/2006  DATE OF DISCHARGE:  09/15/2006                               DISCHARGE SUMMARY   DISPOSITION:  Home.   DISCHARGE DIAGNOSES:  1. Escherichia coli pyelonephritis.  2. Acute on chronic renal insufficiency.  3. End stage renal disease.  4. History of hypertension.  5. Anemia of chronic disease.  6. Status post tubal ligation.  7. Status post cardiac catheterization, with no intervention in 2004.   MEDICATIONS ON DISCHARGE:  1. Ciprofloxacin 500 mg daily x five days.  2. Hectorol 1 mcg p.o. daily.  3. Multivitamin one tablet p.o. daily.  4. Vicodin 5/500 one-half tablet p.o. every 4-6 hours p.r.n.  5. Lopressor 100 mg p.o. twice per day.   Please note that the patient's admission was on Prilosec; but, due to  her renal dysfunction, the Prilosec was discontinued by her admitting  physician.   CONSULTATIONS:  None.   PROCEDURES PERFORMED:  PICC line placement.   DIAGNOSTIC STUDIES:  Chest x-ray, portable, shows a PICC line tip in the  lower SVC.  No pneumothorax.   PRIMARY CARE PHYSICIAN:  Dr. Beverley Fiedler; nephrologist, Dr.  Arrie Aran.   CHIEF COMPLAINT:  Abdominal and back pain.   HISTORY OF PRESENT ILLNESS:  This is a 77 year old female with a history  of chronic renal insufficiency who presents with a one-day history of  right abdominal and back pain.  The patient also had fever, dysuria and  frequency of urination.  Please see history and physical dictated by Dr.  Luciana Axe for details of the History of the Present Illness.   HOSPITAL COURSE:  1. Pyelonephritis. The patient was admitted on started on      ciprofloxacin.  A urine was sent off for culture which revealed a      E. coli urinary  tract infection which was sensitive to      ciprofloxacin.  The patient will be continued on ciprofloxacin for      a total of 10 days for treatment of pyelonephritis.  2. Acute on chronic renal insufficiency:  The patient had an increase      in her creatinine from a baseline of 2.8 to a high of 4.95.  The      patient had her fluid restricted; and her creatinine is trending      downward progressively today, to a discharge creatinine of 2.61.      The patient is to continue her fluid restrictions and to follow up      with her nephrologist next week for further management of her renal      insufficiency.  Please note that the patient states that she had      been referred for placement of dialysis access, but did not follow      up with her appointment.  3. Arthritic pain:  The patient was  originally on Vicodin; however,      due to her increase of renal function, the Vicodin was      discontinued, and she was placed on Ultram.  The patient is being      discharged home with her usual dose of Vicodin one-half tablet      every 8 hours p.r.n.  4. Hypertension:  The patient had been on Prilosec for management of      hypertension.  However, it was discontinued by the admitting      physician.  During her hospitalization, the patient has had blood      pressures ranging from 129-136/73-75.  The patient's Prinivil will      be discontinued for now; and the patient will be started on      Lopressor 100 mg p.o. twice per day.   LABORATORY INVESTIGATIONS:  Pertinent laboratory studies on the day of  discharge:  The patient had a sodium of 141, potassium of 3.5, chloride  of 119, bicarbonate of 18, BUN of 26, creatinine of 2.61.  Please note  that her highest creatinine was 4.95 on March 3rd.  The patient had a  white blood cell count of 8.5, hemoglobin of 10.4, hematocrit of 30.5,  platelet count of 166,000.  The calcium was 8.7.   CONDITION ON DISCHARGE:  At the time of discharge, the  patient was  stable, vital signs were as follows:  Temperature 98, heart rate 79,  blood pressure 136/75, O2 saturations 97% on room air, respirations 15.  The patient's condition on discharge in stable.   FOLLOW-UP:  The patient is to follow up with her primary care physician  within seven days of discharge; and the patient is to follow up with her  nephrologist, Dr. Arrie Aran, early next week, either on Monday or  Tuesday.  I have recommended that the patient have her metabolic profile  reevaluated within four days to evaluate the trend of her renal  function.      Altha Harm, MD  Electronically Signed     MAM/MEDQ  D:  09/15/2006  T:  09/16/2006  Job:  [id number]   cc:   Fanny Dance. Rankins, M.D.

## 2010-11-26 NOTE — H&P (Signed)
[name], [name] NO.:  [id number]   MEDICAL RECORD NO.:  [id number]          PATIENT TYPE:  INP   LOCATION:  5731                         FACILITY:  [facility]   PHYSICIAN:  Gardiner Barefoot, MD    DATE OF BIRTH:  1933-05-22   DATE OF ADMISSION:  09/10/2006  DATE OF DISCHARGE:                              HISTORY & PHYSICAL   PRIMARY CARE PHYSICIAN:  Turkey R. Rankins, M.D.   CHIEF COMPLAINT:  Abdominal pain and back pain.   HISTORY OF PRESENT ILLNESS:  [name] is a 77 year old female with a  history of chronic renal insufficiency who presents here with a one day  history of right abdominal pain and back pain.  The patient states that  she has also had a fever at home, dysuria and increased frequency of  urination.  She also reports a decreased p.o. intake and decreased  appetite.  She was last hospitalized in October for acute renal failure  and her baseline creatinine has been in the 2 to 2.5 range.   PAST MEDICAL HISTORY:  1. Chronic renal insufficiency possibly secondary to ace inhibitor and      hypertension.  2. Hypertension.  3. Anemia of chronic disease.  4. Status post tubal ligation.  5. Status post cath with no intervention in 2004.   MEDICATIONS:  Hectorol 1 mg p.o. q.a.m.  Tramadol 50 mg p.o. b.i.d.  p.r.n.  Prilosec 20 mg p.o. daily.  Phenergan 25 mg p.o. b.i.d. p.r.n.  and Albuterol inhaler 1-2 puffs p.r.n.   ALLERGIES:  No known drug allergies.   SOCIAL HISTORY:  The patient has no history of alcohol, smoking or drug  use.   FAMILY HISTORY:  No known history of heart disease or kidney disease.   REVIEW OF SYSTEMS:  A complete 12 point review of systems was obtained  and was negative on the presenting history of present illness.   PHYSICAL EXAMINATION:  VITAL SIGNS:  Temperature 102.6, pulse 80,  respirations 16, blood pressure 80/55.  GENERAL:  The patient is awake, alert and oriented times 3 and appears  in no acute  distress.  CARDIOVASCULAR:  Regular rate and rhythm with no murmurs, rubs or  gallops.  LUNGS: Clear to auscultation bilaterally.  ABDOMEN:  Obese, mildly tender on the right side and flank CVA  tenderness and positive bowel sounds and no hepatosplenomegaly.  EXTREMITIES:  No cyanosis, clubbing or edema.  SKIN:  No rashes.   LABORATORY DATA:  Urinalysis is notable for positive leukocytes, 11/20  WBCs and many bacteria.  Negative nitrites.  Urine culture is pending.  CBC with a WBC of 9.9, neutrophils 92%.  Hemoglobin 12.2, platelets 231.  Sodium 143, potassium 3.4, chloride 114, glucose 165, BUN 36, creatinine  is 2.8.   ASSESSMENT:  1. Pyelonephritis, will have the patient admitted and start her on      Cipro and watch her blood pressure carefully.  We will follow up on      the urine sensitivities.  2. Hypotension, will continue the patient on intravenous fluids and      monitor  her blood pressure closely.  Will also hold any blood      pressure medicines for now.  3. Renal insufficiency.  We will gently hydrate her for now and      monitor her I&Os closely.  4. Anemia of chronic disease and the patient does get Epo injections      by her Nephrologist.   DISPOSITION:  The patient is a full code.      Gardiner Barefoot, MD  Electronically Signed     RWC/MEDQ  D:  09/10/2006  T:  09/11/2006  Job:  [id number]

## 2010-11-26 NOTE — Consult Note (Signed)
[name], [name]              ACCOUNT NO.:  [id number]   MEDICAL RECORD NO.:  [id number]          PATIENT TYPE:  INP   LOCATION:  6708                         FACILITY:  [facility]   PHYSICIAN:  James L. [name], M.D.DATE OF BIRTH:  1932-09-20   DATE OF CONSULTATION:  DATE OF DISCHARGE:                                   CONSULTATION   REQUESTING PHYSICIAN:  Teaching service.   REASON FOR CONSULTATION:  Acute on top of chronic kidney disease.   HISTORY OF PRESENT ILLNESS:  This is a 77 year old female with past medical  history significant for hypertension about 20-25 years, chronic kidney  disease with creatinine 2004 being 1.9-2.1, and in June 2007 was 2.6, and  there was a reported verbal value of 2.3 on April 06, 2006, but I do not  have that documented.  She has a history of feeling weak, and decreased  appetite about 2 weeks.  She had a flare up of foot pain, thought to be gout  on the left and was treated with a gout medicine for that.  Last Tuesday,  was not feeling good, that was stopped and benazepril was also stopped.  Her  blood pressure was relatively low.  Creatinine was 10 on Friday and was 10.6  today.  She stated she has felt weak, poor appetite, hungry but just does  not want to eat.  She has some chills and fevers but she has not taken her  temperatures.  She has noticed no itchings, she does have some muscle  cramping, she sleeps on 2 pillows, __________ PND, nocturia x1-2, no chest  pain, cough, back pain, hematuria, dysuria, no history of stones, history of  urinary tract infections, and denies non steroidal ingestion.   She has had no shortness of breath or cough.  No diarrhea or vomiting.  She  has no known drug allergies.   PAST MEDICAL HISTORY:  Her past medical history includes the above with  hypertension 20-25 years, asthma, gout, chronic kidney disease.   MEDICATIONS:  Medications include:  1. Albuterol 2 puffs p.r.n.  2. Tramadol 50 mg  p.r.n.  3. Benazepril 10 mg a day.  4. Skin cream each day.  5. Omeprazole 20 mg a day.  6. Hectorol 1 mcg a day.  7. Aranesp 100 mcg every other week.  8. Ketoconazole cream b.i.d.  9. Nystatin cream b.i.d.  10.Tandem 1 a day.  11.Triamcinolone 0.1% b.i.d. also.   FAMILY HISTORY:  Father died at age 51 of bone cancer.  Mother died at age  46 from skin cancer.  Has 2 brothers alive, 3 brothers who died, 1 from  cancer, 1 from motor vehicle accident, 1 shortly after World War II of  unclear etiology.  5 sisters, all alive.  Family history is significant for  hypertension, diabetes, and cancer.  No family history of renal disease.  She has 11 children, she is divorced.  One of her children was murdered.  She has no tobacco abuse history, some alcohol ingestion.  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  REVIEW OF SYSTEMS:  As listed above.  Just not feeling good, decreased  appetite, no headaches, visual difficulties, sores or dried mouth.  PULMONARY:  No coughs, sputum production, or wheezing.  GI:  No indigestion, heartburn, diarrhea.  No history of hepatitis, yellow  jaundice.  CARDIAC:  As above.  Negative.  SKIN:  She denies any problems.  MUSCULOSKELETAL:  She had pain over her left foot, on the top of her left  foot, over the 3rd and 4th toes.  NEUROLOGICAL:  Negative.   OBJECTIVE:  Physical exam:  Very pleasant, elderly female who appears  healthier than stated problems.  Blood pressure 82/56, temp 97.4, heart rate  95, respiratory rate of 16.  In general, she is no acute distress.  HEENT:  Is benign.  NECK:  Unremarkable.  No masses or thyromegaly.  LUNGS:  Reveal no rales, rhonchi, or wheezes.  No percussional __________ ,  normal breath sounds.  CARDIOVASCULAR:  Regular rhythm, grade 2/6 holosystolic murmur best heart at  left sternal border, PMI is 10 cm __________ .  ABDOMEN:  Positive bowel sounds.  Liver is down 2 centers, non  tender.  No  masses felt.  Somewhat obese.  No significant __________ , axillary,  supraclavicular or cervical adenopathy.  SKIN:  Without lesions.  MUSCULOSKELETAL:  Left foot is tender with 3rd and 4th metatarsals, some  edema in that area, and mild erythema and warmth.  NEUROLOGICAL EXAM:  Cranial nerves 2-12 grossly intact.  Motor is 5/5  symmetric, deep ten reflexes are 2+/4+.   LABORATORY DATA:  Hemoglobin 11.3, white count of 8300, platelets 322,000,  2% eosinophils in her differential.  Urinalysis 30 mg with __________ %  protein, 0-2 red cells, 0-2 white cells.  Chemistry:  Sodium 136, potassium  4.0, chloride 108, bi carb of 18, creatinine 10.6, BUN of 82, glucose of 82.   ASSESSMENT:  1. Chronic kidney disease with acute kidney injury of top of it:  The      etiology of the acute kidney injury is most likely hemodynamic.      Decreased blood pressure causing decreased renal blood flow.      Questionable other insults such as myocardial infarction, infection,      obstruction, or other types of insult.  Why the blood pressure is      decreased now is not real clear.  Need to increase the blood pressure      at this time, volume expander, see if her creatinine responds.  And      evaluate her for further causes.  Close to needing dialysis at this      point.  2. Anemia:  Getting EPO once a month.  3. __________ hyperparathyroidism:  On vitamin D.  4. Hypertension:  She is actually hypotensive.  5. History of gout:  Check uric acid.   PLAN:  1. Ultrasound.  2. Urine to sodium creatinine.  3. IV fluids.  4. EPO on iron.  5. Uric acid.          ______________________________  [name]. [name], M.D.    JLD/MEDQ  D:  04/17/2006  T:  04/18/2006  Job:  [id number]

## 2010-11-26 NOTE — Cardiovascular Report (Signed)
NAME:  [name], [name]                        ACCOUNT NO.:  [id number]   MEDICAL RECORD NO.:  [id number]                   PATIENT TYPE:  INP   LOCATION:  3743                                 FACILITY:  [facility]   PHYSICIAN:  Darlin Priestly, M.D.             DATE OF BIRTH:  [birth year]   DATE OF PROCEDURE:  11/11/2002  DATE OF DISCHARGE:                              CARDIAC CATHETERIZATION   PROCEDURES PERFORMED:  1. Left heart catheterization.  2. Coronary angiography.  3. Left ventriculogram.   CARDIOLOGIST:  Darlin Priestly, M.D.   COMPLICATIONS:  None.   INDICATIONS FOR PROCEDURE:  The patient is a 77 year old female, patient of  Dr. Yates Decamp and [facility] with a history of asthma and renal  insufficiency followed by Dr. Arrie Aran who was admitted on Nov 10, 2002  with chest pain and shortness of breath.  She is now referred for cardiac  catheterization to assess her coronary status.   DESCRIPTION OF PROCEDURE:  After obtaining informed consent the patient was  brought to the cardiac cath lab.  The right and left groins were shaved,  prepped and draped in the usual sterile fashion.  ECG monitoring was  established.  Using the modified Seldinger technique a #6 French arterial  sheath was inserted into the right femoral artery.  Six French diagnostic  catheters were used to perform diagnostic angiography.   RESULTS:  Left Main Coronary Artery:  The left main coronary artery showed  no significant disease.   Left Anterior Descending:  The LAD is a large vessel, which courses the apex  and becomes a small tapered vessel at the apex.  There is no significant in  the LAD.  The is one medium size diagonal branch with no significant  disease.   Circumflex Artery:  The left circumflex is a medium size vessel, which  courses the AV groove and gives rise to without obtuse marginal branches.  The AV groove circ has no significant disease.  The first and second obtuse  marginals are medium size vessels, which bifurcate in the mid segment and  has no significant disease.   Right Coronary Artery:  The right coronary artery is a medium size vessel,  which gives rise to the PDA and RV marginal branch.  There is catheter  dampening at the ostium of the RCA, but no visible lesion is noted and this  is probably secondary to small vessel size.  There is no significant disease  in the RCA or PDA.   LEFT VENTRICULOGRAM:  Left ventriculogram was performed using 8 mL of  contrast via hand injection secondary to the renal insufficiency.  This  reveals mildly depressed EF of 45-50%.   HEMODYNAMIC DATA:  Systemic arterial pressure 129/66.  LV systemic pressure  120/7.  LVEDP of 17.    CONCLUSION:  1. No significant coronary artery disease.  2. Low normal ejection fraction.  Darlin Priestly, M.D.    RHM/MEDQ  D:  11/11/2002  T:  11/11/2002  Job:  [id number]   cc:   Cristy Hilts. Jacinto Halim, M.D.  [address]  [place]  [place] [address]  Fax: [phone]   [facility]

## 2010-11-26 NOTE — Discharge Summary (Signed)
   NAME:  [name], [name]                        ACCOUNT NO.:  [id number]   MEDICAL RECORD NO.:  [id number]                   PATIENT TYPE:  INP   LOCATION:  3743                                 FACILITY:  [facility]   PHYSICIAN:  Cristy Hilts. Jacinto Halim, M.D.                  DATE OF BIRTH:  November 02, 1932   DATE OF ADMISSION:  11/10/2002  DATE OF DISCHARGE:  11/12/2002                                 DISCHARGE SUMMARY   DISCHARGE DIAGNOSES:  1. Chest pain consistent with unstable angina on admission, normal     coronaries at catheterization this admission.  2. Mild left ventricular dysfunction with an ejection fraction of 45-50%.  3. Renal insufficiency, creatinine 2.1 at discharge.  4. Anemia, hemoglobin 9.9 at discharge.  5. History of asthma.   HOSPITAL COURSE:  The patient is a 77 year old female, followed at  [facility], who was admitted, Nov 10, 2002, with chest pain.  She had been  on Lotensin 5 mg a day.  She has a history of asthma.  The patient was  admitted to telemetry and started on Lovenox.  CK-MB and troponins were  negative for an MI.  She was set up for diagnostic cath, Nov 11, 2002.  This  revealed essentially normal coronaries with an EF of 45-50%.  We feel she  can be discharged Nov 12, 2002.  She will need a BMP and CBC on Friday after  discharge.  She will see Dr. Cristy Hilts. Ganji in followup.  Dr. Darlin Priestly also suggested that at some point we may want to consider lower  extremity arterial Dopplers as an outpatient.  She was anemic.  B12, folate  and iron levels were obtained prior to discharge.   DISCHARGE MEDICATIONS:  Nexium 40 mg a day.  We did not discharge her on  beta blocker because of her asthma.  We did suggest she take a multivitamin  with iron.   FOLLOWUP:  She will follow up with Dr. Jacinto Halim, Nov 26, 2002, at 10:45.   LABORATORY DATA AT DISCHARGE:  White count 5.9, hemoglobin 9.9, hematocrit  28.8, platelets 204,000.  Sodium 138, potassium 4.1, BUN 24,  creatinine 2.0.  INR 1.1.  Liver functions are normal.  Total cholesterol 169, HDL 53, LDL  105.   EKG reveals sinus rhythm, right bundle branch block.   Chest x-ray shows cardiomegaly without evidence of acute disease.     Abelino Derrick, P.A.                      Cristy Hilts. Jacinto Halim, M.D.    Lenard Lance  D:  11/12/2002  T:  11/13/2002  Job:  [id number]   cc:   Dala Dock

## 2011-04-22 LAB — IRON AND TIBC
Iron: 69
TIBC: 204 — ABNORMAL LOW
UIBC: 135

## 2011-04-22 LAB — CBC
RBC: 4.2
WBC: 5.7

## 2011-04-22 LAB — FERRITIN: Ferritin: 509 — ABNORMAL HIGH (ref 10–291)

## 2011-04-25 LAB — CBC
HCT: 35.5 — ABNORMAL LOW
Platelets: 178
RDW: 14.9 — ABNORMAL HIGH

## 2011-04-27 LAB — HEMOGLOBIN AND HEMATOCRIT, BLOOD
HCT: 35.1 — ABNORMAL LOW
Hemoglobin: 11.9 — ABNORMAL LOW

## 2011-04-27 LAB — IRON AND TIBC
Saturation Ratios: 31
TIBC: 221 — ABNORMAL LOW

## 2011-11-03 ENCOUNTER — Other Ambulatory Visit (HOSPITAL_COMMUNITY): Payer: Self-pay | Admitting: *Deleted

## 2011-11-08 ENCOUNTER — Encounter (HOSPITAL_COMMUNITY)
Admission: RE | Admit: 2011-11-08 | Discharge: 2011-11-08 | Disposition: A | Discharge status: Home / Self Care | Payer: PRIVATE HEALTH INSURANCE | Source: Ambulatory Visit | Attending: Nephrology | Admitting: Nephrology

## 2011-11-08 ENCOUNTER — Encounter (HOSPITAL_COMMUNITY): Payer: PRIVATE HEALTH INSURANCE

## 2011-11-08 DIAGNOSIS — D649 Anemia, unspecified: Secondary | ICD-10-CM | POA: Insufficient documentation

## 2011-11-08 MED ORDER — SODIUM CHLORIDE 0.9 % IV SOLN: INTRAVENOUS | Status: DC

## 2011-11-08 MED ORDER — FERUMOXYTOL INJECTION 510 MG/17 ML
510.0000 mg | INTRAVENOUS | Status: DC
  Administered 2011-11-08: 510 mg via INTRAVENOUS
  Filled 2011-11-08: qty 17

## 2011-11-14 ENCOUNTER — Encounter (HOSPITAL_COMMUNITY)
Admission: RE | Admit: 2011-11-14 | Discharge: 2011-11-14 | Disposition: A | Discharge status: Home / Self Care | Payer: PRIVATE HEALTH INSURANCE | Source: Ambulatory Visit | Attending: Nephrology | Admitting: Nephrology

## 2011-11-14 DIAGNOSIS — D649 Anemia, unspecified: Secondary | ICD-10-CM | POA: Insufficient documentation

## 2011-11-14 MED ORDER — FERUMOXYTOL INJECTION 510 MG/17 ML
510.0000 mg | INTRAVENOUS | Status: AC
  Administered 2011-11-14: 510 mg via INTRAVENOUS
  Filled 2011-11-14: qty 17

## 2011-11-14 MED ORDER — SODIUM CHLORIDE 0.9 % IV SOLN
INTRAVENOUS | Status: AC
  Administered 2011-11-14: 10:00:00 via INTRAVENOUS

## 2012-02-07 ENCOUNTER — Other Ambulatory Visit (HOSPITAL_COMMUNITY): Payer: Self-pay | Admitting: *Deleted

## 2012-02-08 ENCOUNTER — Inpatient Hospital Stay (HOSPITAL_COMMUNITY): Admission: RE | Admit: 2012-02-08 | Payer: PRIVATE HEALTH INSURANCE | Source: Ambulatory Visit

## 2012-02-21 ENCOUNTER — Encounter (HOSPITAL_COMMUNITY)
Admission: RE | Admit: 2012-02-21 | Discharge: 2012-02-21 | Disposition: A | Discharge status: Home / Self Care | Payer: PRIVATE HEALTH INSURANCE | Source: Ambulatory Visit | Attending: Nephrology | Admitting: Nephrology

## 2012-02-21 DIAGNOSIS — D649 Anemia, unspecified: Secondary | ICD-10-CM | POA: Insufficient documentation

## 2012-02-21 LAB — POCT HEMOGLOBIN-HEMACUE: Hemoglobin: 10 g/dL — ABNORMAL LOW (ref 12.0–15.0)

## 2012-02-21 MED ORDER — EPOETIN ALFA 20000 UNIT/ML IJ SOLN
20000.0000 [IU] | INTRAMUSCULAR | Status: DC
  Administered 2012-02-21: 20000 [IU] via SUBCUTANEOUS

## 2012-02-21 MED ORDER — EPOETIN ALFA 20000 UNIT/ML IJ SOLN
INTRAMUSCULAR | Status: AC
  Filled 2012-02-21: qty 1

## 2012-03-05 ENCOUNTER — Other Ambulatory Visit (HOSPITAL_COMMUNITY): Payer: Self-pay | Admitting: *Deleted

## 2012-03-06 ENCOUNTER — Encounter (HOSPITAL_COMMUNITY)
Admission: RE | Admit: 2012-03-06 | Discharge: 2012-03-06 | Disposition: A | Discharge status: Home / Self Care | Payer: PRIVATE HEALTH INSURANCE | Source: Ambulatory Visit | Attending: Nephrology | Admitting: Nephrology

## 2012-03-06 LAB — POCT HEMOGLOBIN-HEMACUE: Hemoglobin: 9.9 g/dL — ABNORMAL LOW (ref 12.0–15.0)

## 2012-03-06 MED ORDER — EPOETIN ALFA 20000 UNIT/ML IJ SOLN
20000.0000 [IU] | INTRAMUSCULAR | Status: DC
  Administered 2012-03-06: 20000 [IU] via SUBCUTANEOUS

## 2012-03-06 MED ORDER — EPOETIN ALFA 20000 UNIT/ML IJ SOLN
INTRAMUSCULAR | Status: AC
  Filled 2012-03-06: qty 1

## 2012-03-20 ENCOUNTER — Encounter (HOSPITAL_COMMUNITY)
Admission: RE | Admit: 2012-03-20 | Discharge: 2012-03-20 | Disposition: A | Discharge status: Home / Self Care | Payer: PRIVATE HEALTH INSURANCE | Source: Ambulatory Visit | Attending: Nephrology | Admitting: Nephrology

## 2012-03-20 DIAGNOSIS — D649 Anemia, unspecified: Secondary | ICD-10-CM | POA: Insufficient documentation

## 2012-03-20 LAB — RENAL FUNCTION PANEL
Albumin: 3.9 g/dL (ref 3.5–5.2)
Chloride: 103 mEq/L (ref 96–112)
GFR calc Af Amer: 10 mL/min — ABNORMAL LOW (ref >90)
GFR calc non Af Amer: 9 mL/min — ABNORMAL LOW (ref >90)
Phosphorus: 4.2 mg/dL (ref 2.3–4.6)
Potassium: 3.3 mEq/L — ABNORMAL LOW (ref 3.5–5.1)
Sodium: 141 mEq/L (ref 135–145)

## 2012-03-20 LAB — URINALYSIS, MICROSCOPIC ONLY
Glucose, UA: NEGATIVE mg/dL
Ketones, ur: NEGATIVE mg/dL
Leukocytes, UA: NEGATIVE
Nitrite: NEGATIVE
Protein, ur: NEGATIVE mg/dL
Urobilinogen, UA: 1 mg/dL (ref 0.0–1.0)

## 2012-03-20 LAB — HEPATITIS B SURFACE ANTIGEN: Hepatitis B Surface Ag: NEGATIVE

## 2012-03-20 LAB — CBC
HCT: 31.9 % — ABNORMAL LOW (ref 36.0–46.0)
Hemoglobin: 10.7 g/dL — ABNORMAL LOW (ref 12.0–15.0)
MCH: 29.2 pg (ref 26.0–34.0)
RBC: 3.67 MIL/uL — ABNORMAL LOW (ref 3.87–5.11)

## 2012-03-20 MED ORDER — EPOETIN ALFA 20000 UNIT/ML IJ SOLN
20000.0000 [IU] | INTRAMUSCULAR | Status: DC
  Administered 2012-03-20: 20000 [IU] via SUBCUTANEOUS
  Filled 2012-03-20: qty 1

## 2012-04-03 ENCOUNTER — Encounter (HOSPITAL_COMMUNITY)
Admission: RE | Admit: 2012-04-03 | Discharge: 2012-04-03 | Disposition: A | Discharge status: Home / Self Care | Payer: PRIVATE HEALTH INSURANCE | Source: Ambulatory Visit | Attending: Nephrology | Admitting: Nephrology

## 2012-04-03 MED ORDER — EPOETIN ALFA 20000 UNIT/ML IJ SOLN
INTRAMUSCULAR | Status: AC
  Filled 2012-04-03: qty 1

## 2012-04-03 MED ORDER — EPOETIN ALFA 20000 UNIT/ML IJ SOLN
20000.0000 [IU] | INTRAMUSCULAR | Status: DC
  Administered 2012-04-03: 20000 [IU] via SUBCUTANEOUS

## 2012-04-17 ENCOUNTER — Encounter (HOSPITAL_COMMUNITY)
Admission: RE | Admit: 2012-04-17 | Discharge: 2012-04-17 | Disposition: A | Discharge status: Home / Self Care | Payer: PRIVATE HEALTH INSURANCE | Source: Ambulatory Visit | Attending: Nephrology | Admitting: Nephrology

## 2012-04-17 DIAGNOSIS — D649 Anemia, unspecified: Secondary | ICD-10-CM | POA: Insufficient documentation

## 2012-04-17 LAB — CBC
MCH: 28.9 pg (ref 26.0–34.0)
MCHC: 32.7 g/dL (ref 30.0–36.0)
MCV: 88.1 fL (ref 78.0–100.0)
Platelets: 206 10*3/uL (ref 150–400)
RDW: 15.4 % (ref 11.5–15.5)

## 2012-04-17 LAB — IRON AND TIBC: TIBC: 243 ug/dL — ABNORMAL LOW (ref 250–470)

## 2012-04-17 LAB — URINALYSIS, MICROSCOPIC ONLY
Hgb urine dipstick: NEGATIVE
Leukocytes, UA: NEGATIVE
Nitrite: NEGATIVE
Specific Gravity, Urine: 1.01 (ref 1.005–1.030)
Urobilinogen, UA: 0.2 mg/dL (ref 0.0–1.0)

## 2012-04-17 LAB — RENAL FUNCTION PANEL
Albumin: 4 g/dL (ref 3.5–5.2)
Chloride: 104 mEq/L (ref 96–112)
Creatinine, Ser: 3.54 mg/dL — ABNORMAL HIGH (ref 0.50–1.10)
GFR calc non Af Amer: 11 mL/min — ABNORMAL LOW (ref >90)

## 2012-04-17 MED ORDER — EPOETIN ALFA 20000 UNIT/ML IJ SOLN
20000.0000 [IU] | INTRAMUSCULAR | Status: DC
  Administered 2012-04-17: 20000 [IU] via SUBCUTANEOUS

## 2012-04-17 MED ORDER — EPOETIN ALFA 20000 UNIT/ML IJ SOLN
INTRAMUSCULAR | Status: AC
  Filled 2012-04-17: qty 1

## 2012-04-18 LAB — PTH, INTACT AND CALCIUM
Calcium, Total (PTH): 10.7 mg/dL — ABNORMAL HIGH (ref 8.4–10.5)
PTH: 235.9 pg/mL — ABNORMAL HIGH (ref 14.0–72.0)

## 2012-05-02 ENCOUNTER — Encounter (HOSPITAL_COMMUNITY)
Admission: RE | Admit: 2012-05-02 | Discharge: 2012-05-02 | Disposition: A | Discharge status: Home / Self Care | Payer: PRIVATE HEALTH INSURANCE | Source: Ambulatory Visit | Attending: Nephrology | Admitting: Nephrology

## 2012-05-02 MED ORDER — EPOETIN ALFA 20000 UNIT/ML IJ SOLN
INTRAMUSCULAR | Status: AC
  Filled 2012-05-02: qty 1

## 2012-05-02 MED ORDER — EPOETIN ALFA 20000 UNIT/ML IJ SOLN
20000.0000 [IU] | INTRAMUSCULAR | Status: DC
  Administered 2012-05-02: 20000 [IU] via SUBCUTANEOUS

## 2012-05-16 ENCOUNTER — Encounter (HOSPITAL_COMMUNITY): Payer: PRIVATE HEALTH INSURANCE

## 2012-05-17 ENCOUNTER — Encounter (HOSPITAL_COMMUNITY)
Admission: RE | Admit: 2012-05-17 | Discharge: 2012-05-17 | Disposition: A | Discharge status: Home / Self Care | Payer: PRIVATE HEALTH INSURANCE | Source: Ambulatory Visit | Attending: Nephrology | Admitting: Nephrology

## 2012-05-17 DIAGNOSIS — D649 Anemia, unspecified: Secondary | ICD-10-CM | POA: Insufficient documentation

## 2012-05-17 LAB — CBC
HCT: 35.8 % — ABNORMAL LOW (ref 36.0–46.0)
MCV: 88.6 fL (ref 78.0–100.0)
RBC: 4.04 MIL/uL (ref 3.87–5.11)
RDW: 15.7 % — ABNORMAL HIGH (ref 11.5–15.5)
WBC: 5.4 10*3/uL (ref 4.0–10.5)

## 2012-05-17 LAB — URINALYSIS, MICROSCOPIC ONLY
Bilirubin Urine: NEGATIVE
Glucose, UA: NEGATIVE mg/dL
Hgb urine dipstick: NEGATIVE
Ketones, ur: NEGATIVE mg/dL
Protein, ur: NEGATIVE mg/dL
Urobilinogen, UA: 0.2 mg/dL (ref 0.0–1.0)

## 2012-05-17 LAB — RENAL FUNCTION PANEL
BUN: 42 mg/dL — ABNORMAL HIGH (ref 6–23)
Calcium: 10.3 mg/dL (ref 8.4–10.5)
Glucose, Bld: 83 mg/dL (ref 70–99)
Phosphorus: 4 mg/dL (ref 2.3–4.6)

## 2012-05-17 MED ORDER — EPOETIN ALFA 20000 UNIT/ML IJ SOLN
20000.0000 [IU] | INTRAMUSCULAR | Status: DC
  Administered 2012-05-17: 20000 [IU] via SUBCUTANEOUS

## 2012-05-17 MED ORDER — EPOETIN ALFA 20000 UNIT/ML IJ SOLN
INTRAMUSCULAR | Status: AC
  Filled 2012-05-17: qty 1

## 2012-05-18 LAB — POCT HEMOGLOBIN-HEMACUE: Hemoglobin: 11.3 g/dL — ABNORMAL LOW (ref 12.0–15.0)

## 2012-05-30 ENCOUNTER — Other Ambulatory Visit (HOSPITAL_COMMUNITY): Payer: Self-pay | Admitting: *Deleted

## 2012-05-31 ENCOUNTER — Encounter (HOSPITAL_COMMUNITY)
Admission: RE | Admit: 2012-05-31 | Discharge: 2012-05-31 | Disposition: A | Discharge status: Home / Self Care | Payer: PRIVATE HEALTH INSURANCE | Source: Ambulatory Visit | Attending: Nephrology | Admitting: Nephrology

## 2012-05-31 LAB — POCT HEMOGLOBIN-HEMACUE: Hemoglobin: 12 g/dL (ref 12.0–15.0)

## 2012-05-31 MED ORDER — EPOETIN ALFA 20000 UNIT/ML IJ SOLN: 20000.0000 [IU] | INTRAMUSCULAR | Status: DC

## 2012-06-14 ENCOUNTER — Encounter (HOSPITAL_COMMUNITY): Payer: PRIVATE HEALTH INSURANCE

## 2012-06-15 ENCOUNTER — Encounter (HOSPITAL_COMMUNITY)
Admission: RE | Admit: 2012-06-15 | Discharge: 2012-06-15 | Disposition: A | Discharge status: Home / Self Care | Payer: PRIVATE HEALTH INSURANCE | Source: Ambulatory Visit | Attending: Nephrology | Admitting: Nephrology

## 2012-06-15 DIAGNOSIS — D649 Anemia, unspecified: Secondary | ICD-10-CM | POA: Insufficient documentation

## 2012-06-15 LAB — POCT HEMOGLOBIN-HEMACUE: Hemoglobin: 11.5 g/dL — ABNORMAL LOW (ref 12.0–15.0)

## 2012-06-15 MED ORDER — EPOETIN ALFA 20000 UNIT/ML IJ SOLN
INTRAMUSCULAR | Status: AC
  Administered 2012-06-15: 20000 [IU] via SUBCUTANEOUS
  Filled 2012-06-15: qty 1

## 2012-06-15 MED ORDER — EPOETIN ALFA 20000 UNIT/ML IJ SOLN
20000.0000 [IU] | INTRAMUSCULAR | Status: DC
  Administered 2012-06-15: 20000 [IU] via SUBCUTANEOUS

## 2012-06-29 ENCOUNTER — Encounter (HOSPITAL_COMMUNITY)
Admission: RE | Admit: 2012-06-29 | Discharge: 2012-06-29 | Disposition: A | Discharge status: Home / Self Care | Payer: PRIVATE HEALTH INSURANCE | Source: Ambulatory Visit | Attending: Nephrology | Admitting: Nephrology

## 2012-06-29 LAB — CBC
Hemoglobin: 11.8 g/dL — ABNORMAL LOW (ref 12.0–15.0)
MCH: 28.1 pg (ref 26.0–34.0)
MCV: 88.1 fL (ref 78.0–100.0)
RBC: 4.2 MIL/uL (ref 3.87–5.11)

## 2012-06-29 LAB — RENAL FUNCTION PANEL
Albumin: 3.6 g/dL (ref 3.5–5.2)
BUN: 53 mg/dL — ABNORMAL HIGH (ref 6–23)
Chloride: 105 mEq/L (ref 96–112)
Creatinine, Ser: 4.05 mg/dL — ABNORMAL HIGH (ref 0.50–1.10)
GFR calc non Af Amer: 10 mL/min — ABNORMAL LOW (ref >90)
Phosphorus: 4.9 mg/dL — ABNORMAL HIGH (ref 2.3–4.6)
Potassium: 3.1 mEq/L — ABNORMAL LOW (ref 3.5–5.1)

## 2012-06-29 LAB — URINALYSIS, MICROSCOPIC ONLY
Glucose, UA: NEGATIVE mg/dL
Leukocytes, UA: NEGATIVE
Nitrite: NEGATIVE
Specific Gravity, Urine: 1.012 (ref 1.005–1.030)
pH: 5.5 (ref 5.0–8.0)

## 2012-06-29 LAB — IRON AND TIBC
Iron: 61 ug/dL (ref 42–135)
TIBC: 231 ug/dL — ABNORMAL LOW (ref 250–470)

## 2012-06-29 LAB — FERRITIN: Ferritin: 436 ng/mL — ABNORMAL HIGH (ref 10–291)

## 2012-06-29 MED ORDER — EPOETIN ALFA 20000 UNIT/ML IJ SOLN
20000.0000 [IU] | INTRAMUSCULAR | Status: DC
  Administered 2012-06-29: 20000 [IU] via SUBCUTANEOUS

## 2012-06-29 MED ORDER — EPOETIN ALFA 20000 UNIT/ML IJ SOLN
INTRAMUSCULAR | Status: AC
  Filled 2012-06-29: qty 1

## 2012-07-13 ENCOUNTER — Encounter (HOSPITAL_COMMUNITY): Payer: PRIVATE HEALTH INSURANCE

## 2012-08-03 ENCOUNTER — Encounter (HOSPITAL_COMMUNITY)
Admission: RE | Admit: 2012-08-03 | Discharge: 2012-08-03 | Disposition: A | Discharge status: Home / Self Care | Payer: PRIVATE HEALTH INSURANCE | Source: Ambulatory Visit | Attending: Nephrology | Admitting: Nephrology

## 2012-08-03 DIAGNOSIS — D649 Anemia, unspecified: Secondary | ICD-10-CM | POA: Insufficient documentation

## 2012-08-03 LAB — CBC
HCT: 36 % (ref 36.0–46.0)
MCHC: 34.7 g/dL (ref 30.0–36.0)
RDW: 15 % (ref 11.5–15.5)

## 2012-08-03 LAB — URINALYSIS, MICROSCOPIC ONLY
Nitrite: NEGATIVE
Specific Gravity, Urine: 1.013 (ref 1.005–1.030)
pH: 5.5 (ref 5.0–8.0)

## 2012-08-03 LAB — RENAL FUNCTION PANEL
BUN: 54 mg/dL — ABNORMAL HIGH (ref 6–23)
CO2: 24 mEq/L (ref 19–32)
Chloride: 103 mEq/L (ref 96–112)
Creatinine, Ser: 3.82 mg/dL — ABNORMAL HIGH (ref 0.50–1.10)

## 2012-08-03 LAB — HEPATITIS B SURFACE ANTIGEN: Hepatitis B Surface Ag: NEGATIVE

## 2012-08-03 MED ORDER — EPOETIN ALFA 20000 UNIT/ML IJ SOLN: 20000.0000 [IU] | INTRAMUSCULAR | Status: DC

## 2012-08-04 LAB — URINE CULTURE: Colony Count: 10000

## 2012-08-06 LAB — PTH, INTACT AND CALCIUM: Calcium, Total (PTH): 10.3 mg/dL (ref 8.4–10.5)

## 2012-08-17 ENCOUNTER — Encounter (HOSPITAL_COMMUNITY)
Admission: RE | Admit: 2012-08-17 | Discharge: 2012-08-17 | Disposition: A | Discharge status: Home / Self Care | Payer: PRIVATE HEALTH INSURANCE | Source: Ambulatory Visit | Attending: Nephrology | Admitting: Nephrology

## 2012-08-17 DIAGNOSIS — D649 Anemia, unspecified: Secondary | ICD-10-CM | POA: Insufficient documentation

## 2012-08-17 MED ORDER — EPOETIN ALFA 20000 UNIT/ML IJ SOLN
INTRAMUSCULAR | Status: AC
  Administered 2012-08-17: 20000 [IU] via SUBCUTANEOUS
  Filled 2012-08-17: qty 1

## 2012-08-17 MED ORDER — EPOETIN ALFA 20000 UNIT/ML IJ SOLN
20000.0000 [IU] | INTRAMUSCULAR | Status: DC
  Administered 2012-08-17: 20000 [IU] via SUBCUTANEOUS

## 2012-08-30 ENCOUNTER — Other Ambulatory Visit (HOSPITAL_COMMUNITY): Payer: Self-pay | Admitting: *Deleted

## 2012-08-31 ENCOUNTER — Encounter (HOSPITAL_COMMUNITY)
Admission: RE | Admit: 2012-08-31 | Discharge: 2012-08-31 | Disposition: A | Discharge status: Home / Self Care | Payer: PRIVATE HEALTH INSURANCE | Source: Ambulatory Visit | Attending: Nephrology | Admitting: Nephrology

## 2012-08-31 LAB — RENAL FUNCTION PANEL
GFR calc Af Amer: 14 mL/min — ABNORMAL LOW (ref >90)
GFR calc non Af Amer: 12 mL/min — ABNORMAL LOW (ref >90)
Glucose, Bld: 92 mg/dL (ref 70–99)
Phosphorus: 4.3 mg/dL (ref 2.3–4.6)
Potassium: 3.6 mEq/L (ref 3.5–5.1)
Sodium: 140 mEq/L (ref 135–145)

## 2012-08-31 LAB — FERRITIN: Ferritin: 538 ng/mL — ABNORMAL HIGH (ref 10–291)

## 2012-08-31 LAB — URINALYSIS, MICROSCOPIC ONLY
Ketones, ur: NEGATIVE mg/dL
Leukocytes, UA: NEGATIVE
Nitrite: NEGATIVE
Protein, ur: NEGATIVE mg/dL

## 2012-08-31 LAB — CBC
HCT: 33.8 % — ABNORMAL LOW (ref 36.0–46.0)
Hemoglobin: 11.1 g/dL — ABNORMAL LOW (ref 12.0–15.0)
MCH: 29.1 pg (ref 26.0–34.0)
MCHC: 32.8 g/dL (ref 30.0–36.0)

## 2012-08-31 LAB — IRON AND TIBC
Iron: 77 ug/dL (ref 42–135)
UIBC: 162 ug/dL (ref 125–400)

## 2012-08-31 MED ORDER — EPOETIN ALFA 20000 UNIT/ML IJ SOLN
INTRAMUSCULAR | Status: AC
  Filled 2012-08-31: qty 1

## 2012-08-31 MED ORDER — EPOETIN ALFA 20000 UNIT/ML IJ SOLN
20000.0000 [IU] | INTRAMUSCULAR | Status: DC
  Administered 2012-08-31: 20000 [IU] via SUBCUTANEOUS

## 2012-09-14 ENCOUNTER — Inpatient Hospital Stay (HOSPITAL_COMMUNITY): Admission: RE | Admit: 2012-09-14 | Payer: PRIVATE HEALTH INSURANCE | Source: Ambulatory Visit

## 2012-09-28 ENCOUNTER — Encounter (HOSPITAL_COMMUNITY)
Admission: RE | Admit: 2012-09-28 | Discharge: 2012-09-28 | Disposition: A | Discharge status: Home / Self Care | Payer: PRIVATE HEALTH INSURANCE | Source: Ambulatory Visit | Attending: Nephrology | Admitting: Nephrology

## 2012-09-28 DIAGNOSIS — D638 Anemia in other chronic diseases classified elsewhere: Secondary | ICD-10-CM | POA: Insufficient documentation

## 2012-09-28 DIAGNOSIS — N184 Chronic kidney disease, stage 4 (severe): Secondary | ICD-10-CM | POA: Insufficient documentation

## 2012-09-28 LAB — RENAL FUNCTION PANEL
CO2: 24 mEq/L (ref 19–32)
Calcium: 10.4 mg/dL (ref 8.4–10.5)
GFR calc Af Amer: 11 mL/min — ABNORMAL LOW (ref >90)
Glucose, Bld: 94 mg/dL (ref 70–99)
Sodium: 141 mEq/L (ref 135–145)

## 2012-09-28 LAB — CBC
MCH: 29.4 pg (ref 26.0–34.0)
MCHC: 34.3 g/dL (ref 30.0–36.0)
Platelets: 188 10*3/uL (ref 150–400)
RBC: 3.77 MIL/uL — ABNORMAL LOW (ref 3.87–5.11)
RDW: 15.1 % (ref 11.5–15.5)

## 2012-09-28 LAB — URINALYSIS, MICROSCOPIC ONLY
Bilirubin Urine: NEGATIVE
Nitrite: NEGATIVE
Specific Gravity, Urine: 1.013 (ref 1.005–1.030)
pH: 5.5 (ref 5.0–8.0)

## 2012-09-28 LAB — HEPATITIS B SURFACE ANTIGEN: Hepatitis B Surface Ag: NEGATIVE

## 2012-09-28 MED ORDER — EPOETIN ALFA 20000 UNIT/ML IJ SOLN: 20000.0000 [IU] | INTRAMUSCULAR | Status: DC

## 2012-09-28 MED ORDER — EPOETIN ALFA 20000 UNIT/ML IJ SOLN
INTRAMUSCULAR | Status: AC
  Administered 2012-09-28: 20000 [IU] via SUBCUTANEOUS
  Filled 2012-09-28: qty 1

## 2012-10-12 ENCOUNTER — Encounter (HOSPITAL_COMMUNITY): Payer: PRIVATE HEALTH INSURANCE

## 2012-10-19 ENCOUNTER — Encounter (HOSPITAL_COMMUNITY)
Admission: RE | Admit: 2012-10-19 | Discharge: 2012-10-19 | Disposition: A | Discharge status: Home / Self Care | Payer: PRIVATE HEALTH INSURANCE | Source: Ambulatory Visit | Attending: Nephrology | Admitting: Nephrology

## 2012-10-19 DIAGNOSIS — D649 Anemia, unspecified: Secondary | ICD-10-CM | POA: Insufficient documentation

## 2012-10-19 DIAGNOSIS — I120 Hypertensive chronic kidney disease with stage 5 chronic kidney disease or end stage renal disease: Secondary | ICD-10-CM | POA: Insufficient documentation

## 2012-10-19 DIAGNOSIS — Z5181 Encounter for therapeutic drug level monitoring: Secondary | ICD-10-CM | POA: Insufficient documentation

## 2012-10-19 DIAGNOSIS — N186 End stage renal disease: Secondary | ICD-10-CM | POA: Insufficient documentation

## 2012-10-19 MED ORDER — EPOETIN ALFA 20000 UNIT/ML IJ SOLN
INTRAMUSCULAR | Status: AC
  Administered 2012-10-19: 20000 [IU] via SUBCUTANEOUS
  Filled 2012-10-19: qty 1

## 2012-10-19 MED ORDER — EPOETIN ALFA 20000 UNIT/ML IJ SOLN: 20000.0000 [IU] | INTRAMUSCULAR | Status: DC

## 2012-11-02 ENCOUNTER — Encounter (HOSPITAL_COMMUNITY)
Admission: RE | Admit: 2012-11-02 | Discharge: 2012-11-02 | Disposition: A | Discharge status: Home / Self Care | Payer: PRIVATE HEALTH INSURANCE | Source: Ambulatory Visit | Attending: Nephrology | Admitting: Nephrology

## 2012-11-02 LAB — CBC
MCH: 29 pg (ref 26.0–34.0)
MCHC: 33.8 g/dL (ref 30.0–36.0)
MCV: 85.8 fL (ref 78.0–100.0)
Platelets: 191 10*3/uL (ref 150–400)
RDW: 15 % (ref 11.5–15.5)
WBC: 5.8 10*3/uL (ref 4.0–10.5)

## 2012-11-02 LAB — URINALYSIS, MICROSCOPIC ONLY
Hgb urine dipstick: NEGATIVE
Specific Gravity, Urine: 1.013 (ref 1.005–1.030)
Urobilinogen, UA: 1 mg/dL (ref 0.0–1.0)

## 2012-11-02 LAB — RENAL FUNCTION PANEL
Calcium: 10.5 mg/dL (ref 8.4–10.5)
GFR calc Af Amer: 12 mL/min — ABNORMAL LOW (ref >90)
GFR calc non Af Amer: 11 mL/min — ABNORMAL LOW (ref >90)
Phosphorus: 3.6 mg/dL (ref 2.3–4.6)
Sodium: 140 mEq/L (ref 135–145)

## 2012-11-02 LAB — IRON AND TIBC: UIBC: 181 ug/dL (ref 125–400)

## 2012-11-02 MED ORDER — EPOETIN ALFA 20000 UNIT/ML IJ SOLN
INTRAMUSCULAR | Status: AC
  Filled 2012-11-02: qty 1

## 2012-11-02 MED ORDER — EPOETIN ALFA 20000 UNIT/ML IJ SOLN
20000.0000 [IU] | INTRAMUSCULAR | Status: DC
  Administered 2012-11-02: 20000 [IU] via SUBCUTANEOUS

## 2012-11-03 LAB — URINE CULTURE: Culture: NO GROWTH

## 2012-11-16 ENCOUNTER — Encounter (HOSPITAL_COMMUNITY)
Admission: RE | Admit: 2012-11-16 | Discharge: 2012-11-16 | Disposition: A | Discharge status: Home / Self Care | Payer: PRIVATE HEALTH INSURANCE | Source: Ambulatory Visit | Attending: Nephrology | Admitting: Nephrology

## 2012-11-16 DIAGNOSIS — N184 Chronic kidney disease, stage 4 (severe): Secondary | ICD-10-CM | POA: Insufficient documentation

## 2012-11-16 DIAGNOSIS — D638 Anemia in other chronic diseases classified elsewhere: Secondary | ICD-10-CM | POA: Insufficient documentation

## 2012-11-16 LAB — POCT HEMOGLOBIN-HEMACUE: Hemoglobin: 10.7 g/dL — ABNORMAL LOW (ref 12.0–15.0)

## 2012-11-16 MED ORDER — EPOETIN ALFA 20000 UNIT/ML IJ SOLN
INTRAMUSCULAR | Status: AC
  Filled 2012-11-16: qty 1

## 2012-11-16 MED ORDER — EPOETIN ALFA 20000 UNIT/ML IJ SOLN
20000.0000 [IU] | INTRAMUSCULAR | Status: DC
  Administered 2012-11-16: 20000 [IU] via SUBCUTANEOUS

## 2012-11-21 ENCOUNTER — Emergency Department (INDEPENDENT_AMBULATORY_CARE_PROVIDER_SITE_OTHER)
Admission: EM | Admit: 2012-11-21 | Discharge: 2012-11-21 | Disposition: A | Discharge status: Home / Self Care | Payer: PRIVATE HEALTH INSURANCE | Source: Home / Self Care | Attending: Emergency Medicine | Admitting: Emergency Medicine

## 2012-11-21 ENCOUNTER — Encounter (HOSPITAL_COMMUNITY): Payer: Self-pay | Admitting: Emergency Medicine

## 2012-11-21 DIAGNOSIS — M1711 Unilateral primary osteoarthritis, right knee: Secondary | ICD-10-CM

## 2012-11-21 DIAGNOSIS — M19079 Primary osteoarthritis, unspecified ankle and foot: Secondary | ICD-10-CM

## 2012-11-21 HISTORY — DX: Gout, unspecified: M10.9

## 2012-11-21 MED ORDER — CAPSAICIN 0.1 % EX CREA: TOPICAL_CREAM | CUTANEOUS | Status: DC

## 2012-11-21 NOTE — ED Notes (Signed)
Pt c/o right knee pain and right ankle pain onset 1 week Sx include: swelling and localized fever;  Pain increases when walking/pressure Denies: inj/truama; Hx of Gout  She is alert and oriented w/no signs of acute distress.

## 2012-11-21 NOTE — ED Provider Notes (Signed)
History     CSN: [id number]  Arrival date & time 11/21/12  1025   First MD Initiated Contact with Patient 11/21/12 1102      Chief Complaint  Patient presents with  . Knee Pain    (Consider location/radiation/quality/duration/timing/severity/associated sxs/prior treatment) HPI Comments: Patient presents urgent care describing for about a week she's been having pain and swelling on the anterior aspect of her right knee. And also right ankle. She suspected this is gout and she has had gout as well on her right foot and ankle before. Denies any recent injury trauma or falls. She has attempted to call her primary care doctor's office with has been unable to get responses for a appointment. She is aware that she should not take any over-the-counter medicines such as ibuprofen or Aleve as she is being monitored for her kidney function by her doctor and a local nephrologist.  Pain exacerbates with movement and when she walks on her right knee. In the past she has seen an orthopedic  provider and had an injection to her right knee that was very painful so she opted not to return.    Denies pain or swelling of her calf area.   Patient is a 79 y.o. female presenting with knee pain. The history is provided by the patient.  Knee Pain Location:  Knee Injury: no   Knee location:  R knee Pain details:    Quality:  Aching and sharp   Radiates to:  Does not radiate   Severity:  Moderate   Onset quality:  Gradual   Timing:  Constant   Progression:  Waxing and waning Chronicity:  Recurrent Dislocation: no   Tetanus status:  Up to date Prior injury to area:  No Relieved by:  Nothing Worsened by:  Activity, bearing weight, adduction and rotation Associated symptoms: decreased ROM and swelling   Associated symptoms: no back pain, no fatigue, no fever, no itching, no muscle weakness, no neck pain, no stiffness and no tingling   Risk factors: no frequent fractures     Past Medical History    Diagnosis Date  . Gout     Past Surgical History  Procedure Laterality Date  . Tubal ligation      No family history on file.  History  Substance Use Topics  . Smoking status: Never Smoker   . Smokeless tobacco: Not on file  . Alcohol Use: No    OB History   Grav Para Term Preterm Abortions TAB SAB Ect Mult Living                  Review of Systems  Constitutional: Negative for fever, chills, activity change, appetite change and fatigue.  HENT: Negative for neck pain.   Respiratory: Negative for shortness of breath.   Musculoskeletal: Negative for back pain, joint swelling, arthralgias, gait problem and stiffness.  Skin: Negative for color change, itching, pallor, rash and wound.  Neurological: Negative for dizziness.    Allergies  Nsaids  Home Medications   Current Outpatient Rx  Name  Route  Sig  Dispense  Refill  . aspirin 81 MG tablet   Oral   Take 81 mg by mouth daily.         . Capsaicin 0.1 % CREA      Apply to affected area 3 times per day x 1 -2 weeks   43 g   0   . doxercalciferol (HECTOROL) 0.5 MCG capsule   Oral   Take  1 mcg by mouth daily.         . furosemide (LASIX) 20 MG tablet   Oral   Take 20 mg by mouth 2 (two) times daily.         . metoprolol succinate (TOPROL-XL) 25 MG 24 hr tablet   Oral   Take 25 mg by mouth daily.         . Nitroglycerin (NITROSTAT SL)   Sublingual   Place under the tongue.         . Rosuvastatin Calcium (CRESTOR PO)   Oral   Take by mouth.           BP 130/69  Pulse 75  Temp(Src) 97.8 F (36.6 C) (Oral)  Resp 18  SpO2 99%  Physical Exam  Vitals reviewed. Constitutional: She appears well-developed and well-nourished.  Eyes: Conjunctivae are normal.  Neck: Neck supple. No JVD present.  Pulmonary/Chest: Effort normal and breath sounds normal.  Abdominal: She exhibits no distension. There is no tenderness.  Musculoskeletal: She exhibits tenderness. She exhibits no edema.        Legs: Lymphadenopathy:    She has no cervical adenopathy.  Neurological: She is alert.  Skin: No rash noted. No erythema. No pallor.    ED Course  Procedures (including critical care time)  Labs Reviewed - No data to display No results found.   1. Arthritis of knee, right   2. Ankle arthritis       MDM  Severe knee arthritis-with degenerative changes. History of gout. Patient to be monitored for her kidney function will be prescribed a local treatment have advised her to talk to her primary care Dr. to return to her orthopedic doctor to consider local treatment such as localized injections with steroids and anesthesia. Patient has been prescribed CAPSAICIN to use in the interim, she agrees with treatment plan have also discussed with her to use ice packs applications for 20 minutes at a time 2-3 times a day. Elevation at night with mild compression or an Ace wrap at home      Jimmie Molly, MD 11/21/12 1200

## 2012-11-29 ENCOUNTER — Other Ambulatory Visit (HOSPITAL_COMMUNITY): Payer: Self-pay | Admitting: *Deleted

## 2012-11-30 ENCOUNTER — Encounter (HOSPITAL_COMMUNITY)
Admission: RE | Admit: 2012-11-30 | Discharge: 2012-11-30 | Disposition: A | Discharge status: Home / Self Care | Payer: PRIVATE HEALTH INSURANCE | Source: Ambulatory Visit | Attending: Nephrology | Admitting: Nephrology

## 2012-11-30 DIAGNOSIS — D638 Anemia in other chronic diseases classified elsewhere: Secondary | ICD-10-CM | POA: Diagnosis not present

## 2012-11-30 LAB — RENAL FUNCTION PANEL
Albumin: 3.5 g/dL (ref 3.5–5.2)
Chloride: 104 mEq/L (ref 96–112)
GFR calc Af Amer: 12 mL/min — ABNORMAL LOW (ref >90)
GFR calc non Af Amer: 11 mL/min — ABNORMAL LOW (ref >90)
Phosphorus: 4.1 mg/dL (ref 2.3–4.6)
Potassium: 3.4 mEq/L — ABNORMAL LOW (ref 3.5–5.1)
Sodium: 141 mEq/L (ref 135–145)

## 2012-11-30 LAB — URINALYSIS W MICROSCOPIC + REFLEX CULTURE
Glucose, UA: NEGATIVE mg/dL
Hgb urine dipstick: NEGATIVE
Ketones, ur: NEGATIVE mg/dL
Protein, ur: NEGATIVE mg/dL

## 2012-11-30 LAB — CBC
Platelets: 333 10*3/uL (ref 150–400)
RBC: 3.67 MIL/uL — ABNORMAL LOW (ref 3.87–5.11)
RDW: 14.8 % (ref 11.5–15.5)
WBC: 6.7 10*3/uL (ref 4.0–10.5)

## 2012-11-30 MED ORDER — EPOETIN ALFA 20000 UNIT/ML IJ SOLN: 20000.0000 [IU] | INTRAMUSCULAR | Status: DC

## 2012-11-30 MED ORDER — EPOETIN ALFA 20000 UNIT/ML IJ SOLN
INTRAMUSCULAR | Status: AC
  Administered 2012-11-30: 20000 [IU] via SUBCUTANEOUS
  Filled 2012-11-30: qty 1

## 2012-12-01 LAB — URINE CULTURE: Colony Count: 85000

## 2012-12-14 ENCOUNTER — Encounter (HOSPITAL_COMMUNITY)
Admission: RE | Admit: 2012-12-14 | Discharge: 2012-12-14 | Disposition: A | Discharge status: Home / Self Care | Payer: PRIVATE HEALTH INSURANCE | Source: Ambulatory Visit | Attending: Nephrology | Admitting: Nephrology

## 2012-12-14 DIAGNOSIS — D649 Anemia, unspecified: Secondary | ICD-10-CM | POA: Insufficient documentation

## 2012-12-14 MED ORDER — EPOETIN ALFA 20000 UNIT/ML IJ SOLN
INTRAMUSCULAR | Status: AC
  Filled 2012-12-14: qty 1

## 2012-12-14 MED ORDER — EPOETIN ALFA 20000 UNIT/ML IJ SOLN
20000.0000 [IU] | INTRAMUSCULAR | Status: DC
  Administered 2012-12-14: 20000 [IU] via SUBCUTANEOUS

## 2012-12-28 ENCOUNTER — Encounter (HOSPITAL_COMMUNITY)
Admission: RE | Admit: 2012-12-28 | Discharge: 2012-12-28 | Disposition: A | Discharge status: Home / Self Care | Payer: PRIVATE HEALTH INSURANCE | Source: Ambulatory Visit | Attending: Nephrology | Admitting: Nephrology

## 2012-12-28 LAB — URINALYSIS W MICROSCOPIC + REFLEX CULTURE
Bilirubin Urine: NEGATIVE
Nitrite: NEGATIVE
Protein, ur: NEGATIVE mg/dL
Specific Gravity, Urine: 1.013 (ref 1.005–1.030)
Urobilinogen, UA: 1 mg/dL (ref 0.0–1.0)

## 2012-12-28 LAB — RENAL FUNCTION PANEL
CO2: 22 mEq/L (ref 19–32)
Calcium: 10.2 mg/dL (ref 8.4–10.5)
Creatinine, Ser: 4.08 mg/dL — ABNORMAL HIGH (ref 0.50–1.10)
Glucose, Bld: 98 mg/dL (ref 70–99)

## 2012-12-28 LAB — CBC
MCH: 28.4 pg (ref 26.0–34.0)
Platelets: 231 10*3/uL (ref 150–400)
RBC: 4.02 MIL/uL (ref 3.87–5.11)
RDW: 15.4 % (ref 11.5–15.5)
WBC: 5.2 10*3/uL (ref 4.0–10.5)

## 2012-12-28 LAB — IRON AND TIBC
Saturation Ratios: 32 % (ref 20–55)
UIBC: 152 ug/dL (ref 125–400)

## 2012-12-28 LAB — HEPATITIS B SURFACE ANTIGEN: Hepatitis B Surface Ag: NEGATIVE

## 2012-12-28 MED ORDER — EPOETIN ALFA 20000 UNIT/ML IJ SOLN
INTRAMUSCULAR | Status: AC
  Filled 2012-12-28: qty 1

## 2012-12-28 MED ORDER — EPOETIN ALFA 20000 UNIT/ML IJ SOLN
20000.0000 [IU] | INTRAMUSCULAR | Status: DC
  Administered 2012-12-28 (×2): 20000 [IU] via SUBCUTANEOUS

## 2012-12-29 LAB — URINE CULTURE: Colony Count: >=100000

## 2013-01-10 ENCOUNTER — Encounter (HOSPITAL_COMMUNITY)
Admission: RE | Admit: 2013-01-10 | Discharge: 2013-01-10 | Disposition: A | Discharge status: Home / Self Care | Payer: PRIVATE HEALTH INSURANCE | Source: Ambulatory Visit | Attending: Nephrology | Admitting: Nephrology

## 2013-01-10 DIAGNOSIS — D649 Anemia, unspecified: Secondary | ICD-10-CM | POA: Insufficient documentation

## 2013-01-10 LAB — POCT HEMOGLOBIN-HEMACUE: Hemoglobin: 10.1 g/dL — ABNORMAL LOW (ref 12.0–15.0)

## 2013-01-10 MED ORDER — EPOETIN ALFA 20000 UNIT/ML IJ SOLN
INTRAMUSCULAR | Status: AC
  Administered 2013-01-10: 20000 [IU] via SUBCUTANEOUS
  Filled 2013-01-10: qty 1

## 2013-01-10 MED ORDER — EPOETIN ALFA 20000 UNIT/ML IJ SOLN: 20000.0000 [IU] | INTRAMUSCULAR | Status: DC

## 2013-01-14 ENCOUNTER — Encounter: Payer: Self-pay | Admitting: Gastroenterology

## 2013-01-25 ENCOUNTER — Encounter (HOSPITAL_COMMUNITY)
Admission: RE | Admit: 2013-01-25 | Discharge: 2013-01-25 | Disposition: A | Discharge status: Home / Self Care | Payer: PRIVATE HEALTH INSURANCE | Source: Ambulatory Visit | Attending: Nephrology | Admitting: Nephrology

## 2013-01-25 LAB — RENAL FUNCTION PANEL
CO2: 24 mEq/L (ref 19–32)
Chloride: 104 mEq/L (ref 96–112)
GFR calc Af Amer: 11 mL/min — ABNORMAL LOW (ref >90)
GFR calc non Af Amer: 10 mL/min — ABNORMAL LOW (ref >90)
Glucose, Bld: 107 mg/dL — ABNORMAL HIGH (ref 70–99)
Sodium: 141 mEq/L (ref 135–145)

## 2013-01-25 LAB — URINALYSIS, ROUTINE W REFLEX MICROSCOPIC
Ketones, ur: NEGATIVE mg/dL
Nitrite: NEGATIVE
Specific Gravity, Urine: 1.012 (ref 1.005–1.030)
pH: 5.5 (ref 5.0–8.0)

## 2013-01-25 LAB — CBC
HCT: 30.6 % — ABNORMAL LOW (ref 36.0–46.0)
Hemoglobin: 10 g/dL — ABNORMAL LOW (ref 12.0–15.0)
MCHC: 32.7 g/dL (ref 30.0–36.0)
RBC: 3.53 MIL/uL — ABNORMAL LOW (ref 3.87–5.11)
WBC: 6.3 10*3/uL (ref 4.0–10.5)

## 2013-01-25 LAB — HEPATITIS B SURFACE ANTIGEN: Hepatitis B Surface Ag: NEGATIVE

## 2013-01-25 LAB — URINE MICROSCOPIC-ADD ON

## 2013-01-25 MED ORDER — EPOETIN ALFA 20000 UNIT/ML IJ SOLN: 20000.0000 [IU] | INTRAMUSCULAR | Status: DC

## 2013-01-25 MED ORDER — EPOETIN ALFA 20000 UNIT/ML IJ SOLN
INTRAMUSCULAR | Status: AC
  Administered 2013-01-25: 20000 [IU] via SUBCUTANEOUS
  Filled 2013-01-25: qty 1

## 2013-01-26 LAB — URINE CULTURE: Culture: NO GROWTH

## 2013-01-30 ENCOUNTER — Ambulatory Visit: Payer: PRIVATE HEALTH INSURANCE | Admitting: Gastroenterology

## 2013-02-08 ENCOUNTER — Encounter (HOSPITAL_COMMUNITY)
Admission: RE | Admit: 2013-02-08 | Discharge: 2013-02-08 | Disposition: A | Discharge status: Home / Self Care | Payer: PRIVATE HEALTH INSURANCE | Source: Ambulatory Visit | Attending: Nephrology | Admitting: Nephrology

## 2013-02-08 DIAGNOSIS — N189 Chronic kidney disease, unspecified: Secondary | ICD-10-CM | POA: Insufficient documentation

## 2013-02-08 DIAGNOSIS — D638 Anemia in other chronic diseases classified elsewhere: Secondary | ICD-10-CM | POA: Insufficient documentation

## 2013-02-08 LAB — POCT HEMOGLOBIN-HEMACUE: Hemoglobin: 9.2 g/dL — ABNORMAL LOW (ref 12.0–15.0)

## 2013-02-08 MED ORDER — EPOETIN ALFA 20000 UNIT/ML IJ SOLN
INTRAMUSCULAR | Status: AC
  Filled 2013-02-08: qty 1

## 2013-02-08 MED ORDER — EPOETIN ALFA 10000 UNIT/ML IJ SOLN
INTRAMUSCULAR | Status: AC
  Filled 2013-02-08: qty 1

## 2013-02-08 MED ORDER — EPOETIN ALFA 20000 UNIT/ML IJ SOLN
20000.0000 [IU] | INTRAMUSCULAR | Status: DC
  Administered 2013-02-08: 20000 [IU] via SUBCUTANEOUS

## 2013-02-21 ENCOUNTER — Other Ambulatory Visit (HOSPITAL_COMMUNITY): Payer: Self-pay

## 2013-02-22 ENCOUNTER — Encounter (HOSPITAL_COMMUNITY)
Admission: RE | Admit: 2013-02-22 | Discharge: 2013-02-22 | Disposition: A | Discharge status: Home / Self Care | Payer: PRIVATE HEALTH INSURANCE | Source: Ambulatory Visit | Attending: Nephrology | Admitting: Nephrology

## 2013-02-22 LAB — POCT HEMOGLOBIN-HEMACUE: Hemoglobin: 9.7 g/dL — ABNORMAL LOW (ref 12.0–15.0)

## 2013-02-22 MED ORDER — EPOETIN ALFA 20000 UNIT/ML IJ SOLN
INTRAMUSCULAR | Status: AC
  Filled 2013-02-22: qty 1

## 2013-02-22 MED ORDER — EPOETIN ALFA 20000 UNIT/ML IJ SOLN
20000.0000 [IU] | INTRAMUSCULAR | Status: DC
  Administered 2013-02-22: 20000 [IU] via SUBCUTANEOUS

## 2013-03-08 ENCOUNTER — Encounter (HOSPITAL_COMMUNITY)
Admission: RE | Admit: 2013-03-08 | Discharge: 2013-03-08 | Disposition: A | Discharge status: Home / Self Care | Payer: PRIVATE HEALTH INSURANCE | Source: Ambulatory Visit | Attending: Nephrology | Admitting: Nephrology

## 2013-03-08 LAB — IRON AND TIBC
Saturation Ratios: 28 % (ref 20–55)
UIBC: 156 ug/dL (ref 125–400)

## 2013-03-08 MED ORDER — EPOETIN ALFA 20000 UNIT/ML IJ SOLN
INTRAMUSCULAR | Status: AC
  Administered 2013-03-08: 20000 [IU] via SUBCUTANEOUS
  Filled 2013-03-08: qty 1

## 2013-03-08 MED ORDER — EPOETIN ALFA 20000 UNIT/ML IJ SOLN: 20000.0000 [IU] | INTRAMUSCULAR | Status: DC

## 2013-03-13 ENCOUNTER — Ambulatory Visit (INDEPENDENT_AMBULATORY_CARE_PROVIDER_SITE_OTHER): Payer: PRIVATE HEALTH INSURANCE | Admitting: Gastroenterology

## 2013-03-13 ENCOUNTER — Other Ambulatory Visit (INDEPENDENT_AMBULATORY_CARE_PROVIDER_SITE_OTHER): Payer: PRIVATE HEALTH INSURANCE

## 2013-03-13 ENCOUNTER — Encounter: Payer: Self-pay | Admitting: Gastroenterology

## 2013-03-13 VITALS — BP 118/60 | HR 60 | Ht 64.0 in | Wt 154.0 lb

## 2013-03-13 DIAGNOSIS — R634 Abnormal weight loss: Secondary | ICD-10-CM

## 2013-03-13 LAB — CBC WITH DIFFERENTIAL/PLATELET
Eosinophils Absolute: 0.1 10*3/uL (ref 0.0–0.7)
Eosinophils Relative: 1.3 % (ref 0.0–5.0)
MCHC: 33.5 g/dL (ref 30.0–36.0)
MCV: 86.5 fl (ref 78.0–100.0)
Monocytes Absolute: 0.4 10*3/uL (ref 0.1–1.0)
Neutrophils Relative %: 54 % (ref 43.0–77.0)
Platelets: 225 10*3/uL (ref 150.0–400.0)
WBC: 5.2 10*3/uL (ref 4.5–10.5)

## 2013-03-13 LAB — COMPREHENSIVE METABOLIC PANEL
AST: 13 U/L (ref 0–37)
Albumin: 4 g/dL (ref 3.5–5.2)
Alkaline Phosphatase: 30 U/L — ABNORMAL LOW (ref 39–117)
Potassium: 3.4 mEq/L — ABNORMAL LOW (ref 3.5–5.1)
Sodium: 139 mEq/L (ref 135–145)
Total Protein: 7.1 g/dL (ref 6.0–8.3)

## 2013-03-13 NOTE — Progress Notes (Signed)
History of Present Illness: 79 year old Afro-American female with history of renal insufficiency and hypertension referred for evaluation a weight loss.  Over the past year she has lost approximately 70 pounds.  She complains of anorexia and early satiety.  She denies abdominal pain, nausea, pyrosis or change in bowel habits.  There is no history of rectal bleeding or melena.  Recent hemoglobin was 9.9 and ferritin 494.  In April, 2014 creatinine was 3.71    Past Medical History  Diagnosis Date  . Gout   . Renal insufficiency   . Hypertension   . Hyperlipemia    Past Surgical History  Procedure Laterality Date  . Tubal ligation     family history is negative for Colon cancer. Current Outpatient Prescriptions  Medication Sig Dispense Refill  . amLODipine (NORVASC) 5 MG tablet Take 5 mg by mouth daily.      Marland Kitchen aspirin 81 MG tablet Take 81 mg by mouth daily.      . Capsaicin 0.1 % CREA Apply to affected area 3 times per day x 1 -2 weeks  43 g  0  . doxercalciferol (HECTOROL) 0.5 MCG capsule Take 1 mcg by mouth daily.      . furosemide (LASIX) 20 MG tablet Take 20 mg by mouth 2 (two) times daily.      Marland Kitchen HYDROcodone-acetaminophen (NORCO/VICODIN) 5-325 MG per tablet Take 1 tablet by mouth every 6 (six) hours as needed for pain.      . metoprolol succinate (TOPROL-XL) 25 MG 24 hr tablet Take 25 mg by mouth daily.      . Nitroglycerin (NITROSTAT SL) Place under the tongue.      . Rosuvastatin Calcium (CRESTOR PO) Take by mouth.       No current facility-administered medications for this visit.   Allergies as of 03/13/2013 - Review Complete 03/13/2013  Allergen Reaction Noted  . Nsaids  04/29/2008    reports that she has never smoked. Her smokeless tobacco use includes Snuff. She reports that she does not drink alcohol or use illicit drugs.     Review of Systems: Pertinent positive and negative review of systems were noted in the above HPI section. All other review of systems were  otherwise negative.  Vital signs were reviewed in today's medical record Physical Exam: General: Well developed , well nourished, no acute distress Skin: anicteric Head: Normocephalic and atraumatic Eyes:  sclerae anicteric, EOMI Ears: Normal auditory acuity Mouth: No deformity or lesions Neck: Supple, no masses or thyromegaly Lungs: Clear throughout to auscultation Heart: Regular rate and rhythm; no murmurs, rubs or bruits Abdomen: Soft, non tender and non distended. No masses, hepatosplenomegaly or hernias noted. Normal Bowel sounds Rectal:deferred Musculoskeletal: Symmetrical with no gross deformities  Skin: No lesions on visible extremities Pulses:  Normal pulses noted Extremities: No clubbing, cyanosis, edema or deformities noted Neurological: Alert oriented x 4, grossly nonfocal Cervical Nodes:  No significant cervical adenopathy Inguinal Nodes: No significant inguinal adenopathy Psychological:  Alert and cooperative. Normal mood and affect

## 2013-03-13 NOTE — Patient Instructions (Addendum)
Your physician has requested that you go to the basement for the following lab work before leaving today:  It has been recommended to you by your physician that you have a(n) EGD completed. Per your request, we did not schedule the procedure(s) today. Please contact our office at [phone] should you decide to have the procedure completed.

## 2013-03-13 NOTE — Assessment & Plan Note (Addendum)
One year history of profound weight loss, anorexia and early satiety.  Upper GI malignancy should be ruled out.  Occult malignancy, anorexia  related to worsening renal function are other considerations.  There are no symptoms suggestive of hyperthyroidism.  Recommendations #1 check comprehensive metabolic profile and Hemoccults #2 upper endoscopy #3 CT of the abdomen and pelvis if endoscopy is not diagnostic #4 patient has never had a colonoscopy.  Would consider this pending results of above

## 2013-03-22 ENCOUNTER — Encounter (HOSPITAL_COMMUNITY)
Admission: RE | Admit: 2013-03-22 | Discharge: 2013-03-22 | Disposition: A | Discharge status: Home / Self Care | Payer: PRIVATE HEALTH INSURANCE | Source: Ambulatory Visit | Attending: Nephrology | Admitting: Nephrology

## 2013-03-22 DIAGNOSIS — I129 Hypertensive chronic kidney disease with stage 1 through stage 4 chronic kidney disease, or unspecified chronic kidney disease: Secondary | ICD-10-CM | POA: Insufficient documentation

## 2013-03-22 DIAGNOSIS — N184 Chronic kidney disease, stage 4 (severe): Secondary | ICD-10-CM | POA: Insufficient documentation

## 2013-03-22 DIAGNOSIS — D638 Anemia in other chronic diseases classified elsewhere: Secondary | ICD-10-CM | POA: Insufficient documentation

## 2013-03-22 MED ORDER — EPOETIN ALFA 20000 UNIT/ML IJ SOLN
INTRAMUSCULAR | Status: AC
  Administered 2013-03-22: 20000 [IU] via SUBCUTANEOUS
  Filled 2013-03-22: qty 1

## 2013-03-22 MED ORDER — EPOETIN ALFA 20000 UNIT/ML IJ SOLN: 20000.0000 [IU] | INTRAMUSCULAR | Status: DC

## 2013-04-05 ENCOUNTER — Encounter (HOSPITAL_COMMUNITY)
Admission: RE | Admit: 2013-04-05 | Discharge: 2013-04-05 | Disposition: A | Discharge status: Home / Self Care | Payer: PRIVATE HEALTH INSURANCE | Source: Ambulatory Visit | Attending: Nephrology | Admitting: Nephrology

## 2013-04-05 LAB — IRON AND TIBC
Iron: 65 ug/dL (ref 42–135)
TIBC: 235 ug/dL — ABNORMAL LOW (ref 250–470)

## 2013-04-05 MED ORDER — EPOETIN ALFA 20000 UNIT/ML IJ SOLN
INTRAMUSCULAR | Status: AC
  Administered 2013-04-05: 20000 [IU] via SUBCUTANEOUS
  Filled 2013-04-05: qty 1

## 2013-04-05 MED ORDER — EPOETIN ALFA 20000 UNIT/ML IJ SOLN: 20000.0000 [IU] | INTRAMUSCULAR | Status: DC

## 2013-04-15 ENCOUNTER — Other Ambulatory Visit: Payer: Self-pay | Admitting: *Deleted

## 2013-04-15 DIAGNOSIS — T82598A Other mechanical complication of other cardiac and vascular devices and implants, initial encounter: Secondary | ICD-10-CM

## 2013-04-19 ENCOUNTER — Encounter (HOSPITAL_COMMUNITY)
Admission: RE | Admit: 2013-04-19 | Discharge: 2013-04-19 | Disposition: A | Discharge status: Home / Self Care | Payer: PRIVATE HEALTH INSURANCE | Source: Ambulatory Visit | Attending: Nephrology | Admitting: Nephrology

## 2013-04-19 DIAGNOSIS — D638 Anemia in other chronic diseases classified elsewhere: Secondary | ICD-10-CM | POA: Insufficient documentation

## 2013-04-19 DIAGNOSIS — N184 Chronic kidney disease, stage 4 (severe): Secondary | ICD-10-CM | POA: Insufficient documentation

## 2013-04-19 LAB — POCT HEMOGLOBIN-HEMACUE: Hemoglobin: 11.3 g/dL — ABNORMAL LOW (ref 12.0–15.0)

## 2013-04-19 MED ORDER — EPOETIN ALFA 20000 UNIT/ML IJ SOLN
INTRAMUSCULAR | Status: AC
  Filled 2013-04-19: qty 1

## 2013-04-19 MED ORDER — EPOETIN ALFA 20000 UNIT/ML IJ SOLN
20000.0000 [IU] | INTRAMUSCULAR | Status: DC
  Administered 2013-04-19: 09:00:00 20000 [IU] via SUBCUTANEOUS

## 2013-05-03 ENCOUNTER — Encounter (HOSPITAL_COMMUNITY)
Admission: RE | Admit: 2013-05-03 | Discharge: 2013-05-03 | Disposition: A | Discharge status: Home / Self Care | Payer: PRIVATE HEALTH INSURANCE | Source: Ambulatory Visit | Attending: Nephrology | Admitting: Nephrology

## 2013-05-03 LAB — RENAL FUNCTION PANEL
Albumin: 3.8 g/dL (ref 3.5–5.2)
BUN: 65 mg/dL — ABNORMAL HIGH (ref 6–23)
CO2: 22 mEq/L (ref 19–32)
Chloride: 105 mEq/L (ref 96–112)
GFR calc Af Amer: 10 mL/min — ABNORMAL LOW (ref >90)
Glucose, Bld: 88 mg/dL (ref 70–99)
Potassium: 2.9 mEq/L — ABNORMAL LOW (ref 3.5–5.1)
Sodium: 143 mEq/L (ref 135–145)

## 2013-05-03 LAB — FERRITIN: Ferritin: 332 ng/mL — ABNORMAL HIGH (ref 10–291)

## 2013-05-03 LAB — IRON AND TIBC
Iron: 42 ug/dL (ref 42–135)
TIBC: 201 ug/dL — ABNORMAL LOW (ref 250–470)
UIBC: 159 ug/dL (ref 125–400)

## 2013-05-03 MED ORDER — EPOETIN ALFA 20000 UNIT/ML IJ SOLN: 20000.0000 [IU] | INTRAMUSCULAR | Status: DC

## 2013-05-06 ENCOUNTER — Encounter: Payer: Self-pay | Admitting: Vascular Surgery

## 2013-05-07 ENCOUNTER — Ambulatory Visit (INDEPENDENT_AMBULATORY_CARE_PROVIDER_SITE_OTHER): Payer: PRIVATE HEALTH INSURANCE | Admitting: Vascular Surgery

## 2013-05-07 ENCOUNTER — Encounter (INDEPENDENT_AMBULATORY_CARE_PROVIDER_SITE_OTHER): Payer: Self-pay

## 2013-05-07 ENCOUNTER — Encounter: Payer: Self-pay | Admitting: Vascular Surgery

## 2013-05-07 ENCOUNTER — Ambulatory Visit (HOSPITAL_COMMUNITY)
Admission: RE | Admit: 2013-05-07 | Discharge: 2013-05-07 | Disposition: A | Discharge status: Home / Self Care | Payer: PRIVATE HEALTH INSURANCE | Source: Ambulatory Visit | Attending: Vascular Surgery | Admitting: Vascular Surgery

## 2013-05-07 VITALS — BP 113/55 | HR 57 | Resp 16 | Ht 64.0 in | Wt 151.0 lb

## 2013-05-07 DIAGNOSIS — N186 End stage renal disease: Secondary | ICD-10-CM

## 2013-05-07 DIAGNOSIS — T82598A Other mechanical complication of other cardiac and vascular devices and implants, initial encounter: Secondary | ICD-10-CM

## 2013-05-07 DIAGNOSIS — Y849 Medical procedure, unspecified as the cause of abnormal reaction of the patient, or of later complication, without mention of misadventure at the time of the procedure: Secondary | ICD-10-CM | POA: Insufficient documentation

## 2013-05-07 NOTE — Progress Notes (Signed)
The patient is seen today for evaluation of left upper arm AV fistula. She has a long history of chronic renal insufficiency and has never had hemodialysis. She had a left upper arm AV fistula placed by myself in 2008. This has had the patency and a good size maturation over the course of 6 years. Recently she was noted to have a more pulsatile nature of her fistula then a thrill and she is seen today for further evaluation. The patient does report some occasional soreness in the area of the fistula. There has been no erythema or inflammation.  Past Medical History  Diagnosis Date  . Gout   . Renal insufficiency   . Hypertension   . Hyperlipemia     History  Substance Use Topics  . Smoking status: Never Smoker   . Smokeless tobacco: Never Used  . Alcohol Use: No    Family History  Problem Relation Age of Onset  . Colon cancer Neg Hx     Allergies  Allergen Reactions  . Nsaids     REACTION: Avoid NSAIDS(renal failure)    Current outpatient prescriptions:amLODipine (NORVASC) 5 MG tablet, Take 5 mg by mouth daily., Disp: , Rfl: ;  aspirin 81 MG tablet, Take 81 mg by mouth daily., Disp: , Rfl: ;  Capsaicin 0.1 % CREA, Apply to affected area 3 times per day x 1 -2 weeks, Disp: 43 g, Rfl: 0;  doxercalciferol (HECTOROL) 0.5 MCG capsule, Take 1 mcg by mouth daily., Disp: , Rfl: ;  furosemide (LASIX) 20 MG tablet, Take 20 mg by mouth 2 (two) times daily., Disp: , Rfl:  HYDROcodone-acetaminophen (NORCO/VICODIN) 5-325 MG per tablet, Take 1 tablet by mouth every 6 (six) hours as needed for pain., Disp: , Rfl: ;  metoprolol succinate (TOPROL-XL) 25 MG 24 hr tablet, Take 25 mg by mouth daily., Disp: , Rfl: ;  Nitroglycerin (NITROSTAT SL), Place under the tongue., Disp: , Rfl: ;  Rosuvastatin Calcium (CRESTOR PO), Take by mouth., Disp: , Rfl:   BP 113/55  Pulse 57  Resp 16  Ht 5\' 4"  (1.626 m)  Wt 151 lb (68.493 kg)  BMI 25.91 kg/m2  Body mass index is 25.91 kg/(m^2).       Review of  systems positive for asthma and some numbness in her legs otherwise review of systems are negative  Visible exam well-developed well-nourished female in no acute distress Neurologically she is grossly intact 2+ radial pulses bilaterally  Skin without ulcers or rashes She does have a very large upper arm cephalic vein fistula on the left with some tortuosity. This is more pulsatile but she does have a bruit present. Duplex of this area in our office today reveal patency of the fistula. There is suggestion of a stenosis as the cephalic vein enters the subclavian vein with a markedly elevated velocities.  Impression and plan left upper arm AV fistula is patent for 6 years. Has had not been used for hemodialysis today. She does have stable stage 4-5 renal insufficiency. I have recommended a shuntogram with a minimal contrast used to image this and probable angioplasty of her stenosis at the confluence of her cephalic vein and subclavian vein. I do feel that this puts her fistula at risk if it is not treated. She understands we will schedule this as an outpatient at her convenience

## 2013-05-17 ENCOUNTER — Encounter (HOSPITAL_COMMUNITY)
Admission: RE | Admit: 2013-05-17 | Discharge: 2013-05-17 | Disposition: A | Discharge status: Home / Self Care | Payer: PRIVATE HEALTH INSURANCE | Source: Ambulatory Visit | Attending: Nephrology | Admitting: Nephrology

## 2013-05-17 DIAGNOSIS — I120 Hypertensive chronic kidney disease with stage 5 chronic kidney disease or end stage renal disease: Secondary | ICD-10-CM | POA: Insufficient documentation

## 2013-05-17 DIAGNOSIS — D649 Anemia, unspecified: Secondary | ICD-10-CM | POA: Insufficient documentation

## 2013-05-17 DIAGNOSIS — N186 End stage renal disease: Secondary | ICD-10-CM | POA: Insufficient documentation

## 2013-05-17 MED ORDER — EPOETIN ALFA 20000 UNIT/ML IJ SOLN: 20000.0000 [IU] | INTRAMUSCULAR | Status: DC

## 2013-05-17 MED ORDER — EPOETIN ALFA 20000 UNIT/ML IJ SOLN
INTRAMUSCULAR | Status: AC
  Administered 2013-05-17: 09:00:00 20000 [IU] via SUBCUTANEOUS
  Filled 2013-05-17: qty 1

## 2013-05-20 LAB — PTH, INTACT AND CALCIUM: Calcium, Total (PTH): 10.5 mg/dL (ref 8.4–10.5)

## 2013-05-30 ENCOUNTER — Other Ambulatory Visit (HOSPITAL_COMMUNITY): Payer: Self-pay | Admitting: *Deleted

## 2013-05-31 ENCOUNTER — Encounter (HOSPITAL_COMMUNITY)
Admission: RE | Admit: 2013-05-31 | Discharge: 2013-05-31 | Disposition: A | Discharge status: Home / Self Care | Payer: PRIVATE HEALTH INSURANCE | Source: Ambulatory Visit | Attending: Nephrology | Admitting: Nephrology

## 2013-05-31 LAB — POCT HEMOGLOBIN-HEMACUE: Hemoglobin: 12 g/dL (ref 12.0–15.0)

## 2013-05-31 LAB — IRON AND TIBC
Saturation Ratios: 21 % (ref 20–55)
TIBC: 218 ug/dL — ABNORMAL LOW (ref 250–470)
UIBC: 172 ug/dL (ref 125–400)

## 2013-05-31 LAB — FERRITIN: Ferritin: 339 ng/mL — ABNORMAL HIGH (ref 10–291)

## 2013-05-31 MED ORDER — EPOETIN ALFA 20000 UNIT/ML IJ SOLN: 20000.0000 [IU] | INTRAMUSCULAR | Status: DC

## 2013-06-14 ENCOUNTER — Encounter (HOSPITAL_COMMUNITY)
Admission: RE | Admit: 2013-06-14 | Discharge: 2013-06-14 | Disposition: A | Discharge status: Home / Self Care | Payer: PRIVATE HEALTH INSURANCE | Source: Ambulatory Visit | Attending: Nephrology | Admitting: Nephrology

## 2013-06-14 DIAGNOSIS — N186 End stage renal disease: Secondary | ICD-10-CM | POA: Insufficient documentation

## 2013-06-14 DIAGNOSIS — D631 Anemia in chronic kidney disease: Secondary | ICD-10-CM | POA: Diagnosis not present

## 2013-06-14 DIAGNOSIS — D649 Anemia, unspecified: Secondary | ICD-10-CM | POA: Diagnosis present

## 2013-06-14 DIAGNOSIS — I120 Hypertensive chronic kidney disease with stage 5 chronic kidney disease or end stage renal disease: Secondary | ICD-10-CM | POA: Insufficient documentation

## 2013-06-14 LAB — POCT HEMOGLOBIN-HEMACUE: Hemoglobin: 12.1 g/dL (ref 12.0–15.0)

## 2013-06-14 MED ORDER — EPOETIN ALFA 20000 UNIT/ML IJ SOLN: 20000.0000 [IU] | INTRAMUSCULAR | Status: DC

## 2013-06-28 ENCOUNTER — Encounter (HOSPITAL_COMMUNITY)
Admission: RE | Admit: 2013-06-28 | Discharge: 2013-06-28 | Disposition: A | Discharge status: Home / Self Care | Payer: PRIVATE HEALTH INSURANCE | Source: Ambulatory Visit | Attending: Nephrology | Admitting: Nephrology

## 2013-06-28 DIAGNOSIS — D631 Anemia in chronic kidney disease: Secondary | ICD-10-CM | POA: Diagnosis not present

## 2013-06-28 LAB — RENAL FUNCTION PANEL
Albumin: 3.9 g/dL (ref 3.5–5.2)
BUN: 63 mg/dL — ABNORMAL HIGH (ref 6–23)
Calcium: 10 mg/dL (ref 8.4–10.5)
Chloride: 103 mEq/L (ref 96–112)
Creatinine, Ser: 4.34 mg/dL — ABNORMAL HIGH (ref 0.50–1.10)
GFR calc non Af Amer: 9 mL/min — ABNORMAL LOW (ref >90)

## 2013-06-28 LAB — FERRITIN: Ferritin: 453 ng/mL — ABNORMAL HIGH (ref 10–291)

## 2013-06-28 LAB — POCT HEMOGLOBIN-HEMACUE: Hemoglobin: 10.8 g/dL — ABNORMAL LOW (ref 12.0–15.0)

## 2013-06-28 LAB — IRON AND TIBC: TIBC: 248 ug/dL — ABNORMAL LOW (ref 250–470)

## 2013-06-28 MED ORDER — EPOETIN ALFA 20000 UNIT/ML IJ SOLN
20000.0000 [IU] | INTRAMUSCULAR | Status: DC
  Administered 2013-06-28: 20000 [IU] via SUBCUTANEOUS

## 2013-06-28 MED ORDER — EPOETIN ALFA 20000 UNIT/ML IJ SOLN
INTRAMUSCULAR | Status: AC
  Filled 2013-06-28: qty 1

## 2013-07-12 ENCOUNTER — Encounter (HOSPITAL_COMMUNITY): Payer: PRIVATE HEALTH INSURANCE

## 2013-07-18 ENCOUNTER — Encounter (HOSPITAL_COMMUNITY)
Admission: RE | Admit: 2013-07-18 | Discharge: 2013-07-18 | Disposition: A | Discharge status: Home / Self Care | Payer: PRIVATE HEALTH INSURANCE | Source: Ambulatory Visit | Attending: Nephrology | Admitting: Nephrology

## 2013-07-18 DIAGNOSIS — N184 Chronic kidney disease, stage 4 (severe): Secondary | ICD-10-CM | POA: Insufficient documentation

## 2013-07-18 DIAGNOSIS — D638 Anemia in other chronic diseases classified elsewhere: Secondary | ICD-10-CM | POA: Insufficient documentation

## 2013-07-18 LAB — POCT HEMOGLOBIN-HEMACUE: Hemoglobin: 10.1 g/dL — ABNORMAL LOW (ref 12.0–15.0)

## 2013-07-18 MED ORDER — EPOETIN ALFA 20000 UNIT/ML IJ SOLN
20000.0000 [IU] | INTRAMUSCULAR | Status: DC
  Administered 2013-07-18: 20000 [IU] via SUBCUTANEOUS

## 2013-07-18 MED ORDER — EPOETIN ALFA 20000 UNIT/ML IJ SOLN
INTRAMUSCULAR | Status: AC
  Filled 2013-07-18: qty 1

## 2013-08-01 ENCOUNTER — Encounter (HOSPITAL_COMMUNITY)
Admission: RE | Admit: 2013-08-01 | Discharge: 2013-08-01 | Disposition: A | Discharge status: Home / Self Care | Payer: PRIVATE HEALTH INSURANCE | Source: Ambulatory Visit | Attending: Nephrology | Admitting: Nephrology

## 2013-08-01 LAB — IRON AND TIBC
Iron: 69 ug/dL (ref 42–135)
Saturation Ratios: 31 % (ref 20–55)
TIBC: 224 ug/dL — ABNORMAL LOW (ref 250–470)
UIBC: 155 ug/dL (ref 125–400)

## 2013-08-01 LAB — POCT HEMOGLOBIN-HEMACUE: HEMOGLOBIN: 10.8 g/dL — AB (ref 12.0–15.0)

## 2013-08-01 LAB — FERRITIN: Ferritin: 737 ng/mL — ABNORMAL HIGH (ref 10–291)

## 2013-08-01 MED ORDER — EPOETIN ALFA 20000 UNIT/ML IJ SOLN
INTRAMUSCULAR | Status: AC
  Filled 2013-08-01: qty 1

## 2013-08-01 MED ORDER — EPOETIN ALFA 20000 UNIT/ML IJ SOLN
20000.0000 [IU] | INTRAMUSCULAR | Status: DC
  Administered 2013-08-01: 20000 [IU] via SUBCUTANEOUS

## 2013-08-15 ENCOUNTER — Encounter (HOSPITAL_COMMUNITY): Payer: PRIVATE HEALTH INSURANCE

## 2013-08-16 ENCOUNTER — Ambulatory Visit (HOSPITAL_COMMUNITY)
Admission: RE | Admit: 2013-08-16 | Discharge: 2013-08-16 | Disposition: A | Discharge status: Home / Self Care | Payer: PRIVATE HEALTH INSURANCE | Source: Ambulatory Visit | Attending: Internal Medicine | Admitting: Internal Medicine

## 2013-08-16 ENCOUNTER — Other Ambulatory Visit (HOSPITAL_COMMUNITY): Payer: Self-pay | Admitting: Internal Medicine

## 2013-08-16 DIAGNOSIS — M810 Age-related osteoporosis without current pathological fracture: Secondary | ICD-10-CM | POA: Insufficient documentation

## 2013-08-16 DIAGNOSIS — T148XXA Other injury of unspecified body region, initial encounter: Secondary | ICD-10-CM

## 2013-08-16 DIAGNOSIS — S92009A Unspecified fracture of unspecified calcaneus, initial encounter for closed fracture: Secondary | ICD-10-CM | POA: Insufficient documentation

## 2013-08-16 DIAGNOSIS — W19XXXA Unspecified fall, initial encounter: Secondary | ICD-10-CM | POA: Insufficient documentation

## 2013-08-22 ENCOUNTER — Emergency Department (HOSPITAL_COMMUNITY)
Admission: EM | Admit: 2013-08-22 | Discharge: 2013-08-23 | Disposition: A | Discharge status: Home / Self Care | Payer: PRIVATE HEALTH INSURANCE | Attending: Emergency Medicine | Admitting: Emergency Medicine

## 2013-08-22 ENCOUNTER — Other Ambulatory Visit (HOSPITAL_COMMUNITY): Payer: Self-pay | Admitting: *Deleted

## 2013-08-22 ENCOUNTER — Encounter (HOSPITAL_COMMUNITY): Payer: Self-pay | Admitting: Emergency Medicine

## 2013-08-22 DIAGNOSIS — Z862 Personal history of diseases of the blood and blood-forming organs and certain disorders involving the immune mechanism: Secondary | ICD-10-CM | POA: Insufficient documentation

## 2013-08-22 DIAGNOSIS — K5900 Constipation, unspecified: Secondary | ICD-10-CM | POA: Insufficient documentation

## 2013-08-22 DIAGNOSIS — I10 Essential (primary) hypertension: Secondary | ICD-10-CM | POA: Insufficient documentation

## 2013-08-22 DIAGNOSIS — Z79899 Other long term (current) drug therapy: Secondary | ICD-10-CM | POA: Insufficient documentation

## 2013-08-22 DIAGNOSIS — E785 Hyperlipidemia, unspecified: Secondary | ICD-10-CM | POA: Insufficient documentation

## 2013-08-22 DIAGNOSIS — K5641 Fecal impaction: Secondary | ICD-10-CM | POA: Insufficient documentation

## 2013-08-22 DIAGNOSIS — Z7982 Long term (current) use of aspirin: Secondary | ICD-10-CM | POA: Insufficient documentation

## 2013-08-22 DIAGNOSIS — Z87448 Personal history of other diseases of urinary system: Secondary | ICD-10-CM | POA: Insufficient documentation

## 2013-08-22 DIAGNOSIS — K5649 Other impaction of intestine: Secondary | ICD-10-CM

## 2013-08-22 DIAGNOSIS — Z8639 Personal history of other endocrine, nutritional and metabolic disease: Secondary | ICD-10-CM | POA: Insufficient documentation

## 2013-08-22 DIAGNOSIS — R011 Cardiac murmur, unspecified: Secondary | ICD-10-CM | POA: Insufficient documentation

## 2013-08-22 MED ORDER — FLEET ENEMA 7-19 GM/118ML RE ENEM
1.0000 | ENEMA | Freq: Once | RECTAL | Status: AC
  Administered 2013-08-22: 1 via RECTAL
  Filled 2013-08-22: qty 1

## 2013-08-22 NOTE — ED Notes (Signed)
Pt reports she does not have to have bowel movement right now. Will re-assess shortly. Pt reports her pain is gone.

## 2013-08-22 NOTE — ED Notes (Signed)
Pt reports having constipation x 2 days. Called dr and was told to drink laxatives, which she did x 2. Now reports having small amounts of diarrhea, which is constant but still feels like she is possibly impacted.

## 2013-08-22 NOTE — ED Provider Notes (Signed)
Medical screening examination/treatment/procedure(s) were conducted as a shared visit with non-physician practitioner(s) and myself.  I personally evaluated the patient during the encounter.  EKG Interpretation   None       [name] with CC of constipation.  She has had no abdominal pain, but reports some rectal pain when straining to have BM.  On my exam, well appearing, nontoxic, normal respiratory effort, normal mentation and perfusion.  Abdomen soft and nontender.  No r/r/g.  PA Duke SalviaLackey has disimpacted a large fecal impaction.  I do not think she needs any abdominal imaging given her history and exam.  Plan continued outpatient management.    Clinical Impression: 1. Constipation   2. Impaction of the bowels       [name] ChurnJohn David Myer Bohlman III, MD 08/23/13 [phone]

## 2013-08-22 NOTE — ED Provider Notes (Signed)
CSN: [id number]     Arrival date & time 08/22/13  1544 History   First MD Initiated Contact with Patient 08/22/13 2033     Chief Complaint  Patient presents with  . Constipation     (Consider location/radiation/quality/duration/timing/severity/associated sxs/prior Treatment) HPI 80 yo female presents with constipation that started on Monday. Patient started having lower abdomen pain this morning. Patient states pain comes in waves. Pain described as crampy. Pain radiates to rectum. Admits to pain with BM. Denies fever, N/V/D, bloody stools, or any urinary sxs. Patient admits to "large" BM  this morning but feels like she is still constipated. Patient states that her "butt hurts when I sit down".  Past Medical History  Diagnosis Date  . Gout   . Renal insufficiency   . Hypertension   . Hyperlipemia    Past Surgical History  Procedure Laterality Date  . Tubal ligation    . Av fistula placement     Family History  Problem Relation Age of Onset  . Colon cancer Neg Hx    History  Substance Use Topics  . Smoking status: Never Smoker   . Smokeless tobacco: Never Used  . Alcohol Use: No   OB History   Grav Para Term Preterm Abortions TAB SAB Ect Mult Living                 Review of Systems  All other systems reviewed and are negative.      Allergies  Nsaids  Home Medications   Current Outpatient Rx  Name  Route  Sig  Dispense  Refill  . albuterol (PROAIR HFA) 108 (90 BASE) MCG/ACT inhaler   Inhalation   Inhale 1 puff into the lungs at bedtime.         Marland Kitchen. amLODipine (NORVASC) 5 MG tablet   Oral   Take 5 mg by mouth daily.         Marland Kitchen. aspirin EC 81 MG tablet   Oral   Take 81 mg by mouth daily.         . furosemide (LASIX) 20 MG tablet   Oral   Take 20 mg by mouth daily.          Marland Kitchen. HYDROcodone-acetaminophen (NORCO/VICODIN) 5-325 MG per tablet   Oral   Take 1 tablet by mouth daily as needed (pain).          . nitroGLYCERIN (NITROSTAT) 0.4 MG SL  tablet   Sublingual   Place 0.4 mg under the tongue every 5 (five) minutes as needed for chest pain.         . rosuvastatin (CRESTOR) 10 MG tablet   Oral   Take 10 mg by mouth at bedtime.         . polyethylene glycol powder (GLYCOLAX/MIRALAX) powder   Oral   Take 17 g by mouth 2 (two) times daily. Until daily soft stools  OTC   255 g   0    BP 120/58  Pulse 52  Temp(Src) 97.8 F (36.6 C) (Oral)  Resp 20  SpO2 99% Physical Exam  Nursing note and vitals reviewed. Constitutional: She is oriented to person, place, and time. She appears well-developed and well-nourished. No distress.  HENT:  Head: Normocephalic and atraumatic.  Eyes: Conjunctivae are normal.  Neck: No tracheal deviation present.  Cardiovascular: Normal rate.   Murmur (Patient has hx of pulmonic and mitral regurgitation.) heard. Pulmonary/Chest: Effort normal. No respiratory distress.  Abdominal: Soft. Normal appearance and bowel sounds are  normal. She exhibits no distension. There is no hepatosplenomegaly. There is no tenderness. There is no rigidity, no rebound, no guarding, no tenderness at McBurney's point and negative Murphy's sign. No hernia.  Genitourinary: Rectal exam shows no internal hemorrhoid and no fissure.  No gross blood noted on exam.  No obvious hemorrhoids noted.  Rectum impacted with stool.      Musculoskeletal: Normal range of motion. She exhibits no edema.  Neurological: She is alert and oriented to person, place, and time.  Skin: Skin is warm and dry. She is not diaphoretic.  Psychiatric: She has a normal mood and affect. Her behavior is normal.    ED Course  Fecal disimpaction Date/Time: 08/23/2013 1:10 PM Performed by: Rudene Anda Authorized by: Rudene Anda Consent: Verbal consent obtained. Risks and benefits: risks, benefits and alternatives were discussed Consent given by: patient Patient understanding: patient states understanding of the procedure being  performed Patient identity confirmed: verbally with patient and arm band Time out: Immediately prior to procedure a "time out" was called to verify the correct patient, procedure, equipment, support staff and site/side marked as required. Preparation: Patient was prepped and draped in the usual sterile fashion. Local anesthesia used: no Patient sedated: no Patient tolerance: Patient tolerated the procedure well with no immediate complications. Comments: Patient states her rectal pain has resolved after manual disimpaction of rectum. No evidence of bleeding was noted throughout procedure.    (including critical care time) Labs Review Labs Reviewed - No data to display Imaging Review No results found.  EKG Interpretation   None       MDM   Final diagnoses:  Constipation  Impaction of the bowels   Patient pain improved post fecal disimpaction procedure. Patient states she is ready to go home. Plan to have patient take Miralax twice daily until patient has normal soft regular BMs. Patient advised follow up with PCP in 2 days. Return precautions given should abdominal pain worsen, develops fever/chills, or rectal bleeding. Patient confirms understanding. Discharged in good condition.   Meds given in ED:  Medications  sodium phosphate (FLEET) 7-19 GM/118ML enema 1 enema (1 enema Rectal Given 08/22/13 2317)    Discharge Medication List as of 08/23/2013 12:20 AM    START taking these medications   Details  polyethylene glycol powder (GLYCOLAX/MIRALAX) powder Take 17 g by mouth 2 (two) times daily. Until daily soft stools  OTC, Starting 08/23/2013, Until Discontinued, Print           Rudene Anda, [place] 08/23/13 1318

## 2013-08-23 ENCOUNTER — Encounter (HOSPITAL_COMMUNITY)
Admission: RE | Admit: 2013-08-23 | Discharge: 2013-08-23 | Disposition: A | Discharge status: Home / Self Care | Payer: PRIVATE HEALTH INSURANCE | Source: Ambulatory Visit | Attending: Nephrology | Admitting: Nephrology

## 2013-08-23 DIAGNOSIS — N184 Chronic kidney disease, stage 4 (severe): Secondary | ICD-10-CM | POA: Insufficient documentation

## 2013-08-23 DIAGNOSIS — D638 Anemia in other chronic diseases classified elsewhere: Secondary | ICD-10-CM | POA: Insufficient documentation

## 2013-08-23 LAB — POCT HEMOGLOBIN-HEMACUE: Hemoglobin: 9.3 g/dL — ABNORMAL LOW (ref 12.0–15.0)

## 2013-08-23 LAB — RENAL FUNCTION PANEL
ALBUMIN: 3.3 g/dL — AB (ref 3.5–5.2)
BUN: 68 mg/dL — ABNORMAL HIGH (ref 6–23)
CALCIUM: 9.7 mg/dL (ref 8.4–10.5)
CO2: 28 mEq/L (ref 19–32)
Chloride: 97 mEq/L (ref 96–112)
Creatinine, Ser: 3.92 mg/dL — ABNORMAL HIGH (ref 0.50–1.10)
GFR, EST AFRICAN AMERICAN: 12 mL/min — AB (ref >90)
GFR, EST NON AFRICAN AMERICAN: 10 mL/min — AB (ref >90)
Glucose, Bld: 167 mg/dL — ABNORMAL HIGH (ref 70–99)
PHOSPHORUS: 2.9 mg/dL (ref 2.3–4.6)
Potassium: 3.3 mEq/L — ABNORMAL LOW (ref 3.7–5.3)
SODIUM: 139 meq/L (ref 137–147)

## 2013-08-23 MED ORDER — CLONIDINE HCL 0.1 MG PO TABS: 0.1000 mg | ORAL_TABLET | ORAL | Status: DC | PRN

## 2013-08-23 MED ORDER — EPOETIN ALFA 20000 UNIT/ML IJ SOLN: 20000.0000 [IU] | INTRAMUSCULAR | Status: DC

## 2013-08-23 MED ORDER — POLYETHYLENE GLYCOL 3350 17 GM/SCOOP PO POWD: 17.0000 g | Freq: Two times a day (BID) | ORAL | Status: DC

## 2013-08-23 MED ORDER — EPOETIN ALFA 20000 UNIT/ML IJ SOLN
INTRAMUSCULAR | Status: AC
  Administered 2013-08-23: 10:00:00 20000 [IU] via SUBCUTANEOUS
  Filled 2013-08-23: qty 1

## 2013-08-23 NOTE — ED Notes (Signed)
PA at bedside performing decompaction.

## 2013-08-23 NOTE — ED Provider Notes (Signed)
Medical screening examination/treatment/procedure(s) were conducted as a shared visit with non-physician practitioner(s) and myself.  I personally evaluated the patient during the encounter.   Please see my separate note.     [name] ChurnJohn David Elorah Dewing III, MD 08/23/13 [phone]

## 2013-08-23 NOTE — Discharge Instructions (Signed)
Follow up with your primary care provider in 2 days. Take Miralax 2 times daily until you have regular soft bowel movements. Eat plenty of fruits and vegetable and other high fiber foods. Drink plenty of fluids to maintain hydration status. Return to Emergency department if you develop worsening abdominal pain or fever/chills.

## 2013-09-06 ENCOUNTER — Encounter (HOSPITAL_COMMUNITY)
Admission: RE | Admit: 2013-09-06 | Discharge: 2013-09-06 | Disposition: A | Discharge status: Home / Self Care | Payer: PRIVATE HEALTH INSURANCE | Source: Ambulatory Visit | Attending: Nephrology | Admitting: Nephrology

## 2013-09-06 LAB — IRON AND TIBC
IRON: 73 ug/dL (ref 42–135)
SATURATION RATIOS: 28 % (ref 20–55)
TIBC: 259 ug/dL (ref 250–470)
UIBC: 186 ug/dL (ref 125–400)

## 2013-09-06 LAB — POCT HEMOGLOBIN-HEMACUE: Hemoglobin: 9.9 g/dL — ABNORMAL LOW (ref 12.0–15.0)

## 2013-09-06 LAB — FERRITIN: Ferritin: 599 ng/mL — ABNORMAL HIGH (ref 10–291)

## 2013-09-06 MED ORDER — EPOETIN ALFA 20000 UNIT/ML IJ SOLN
INTRAMUSCULAR | Status: AC
  Filled 2013-09-06: qty 1

## 2013-09-06 MED ORDER — EPOETIN ALFA 20000 UNIT/ML IJ SOLN
20000.0000 [IU] | INTRAMUSCULAR | Status: DC
  Administered 2013-09-06: 20000 [IU] via SUBCUTANEOUS

## 2013-09-06 MED ORDER — CLONIDINE HCL 0.1 MG PO TABS: 0.1000 mg | ORAL_TABLET | ORAL | Status: DC | PRN

## 2013-09-20 ENCOUNTER — Encounter (HOSPITAL_COMMUNITY)
Admission: RE | Admit: 2013-09-20 | Discharge: 2013-09-20 | Disposition: A | Discharge status: Home / Self Care | Payer: PRIVATE HEALTH INSURANCE | Source: Ambulatory Visit | Attending: Nephrology | Admitting: Nephrology

## 2013-09-20 DIAGNOSIS — N184 Chronic kidney disease, stage 4 (severe): Secondary | ICD-10-CM | POA: Diagnosis not present

## 2013-09-20 DIAGNOSIS — D638 Anemia in other chronic diseases classified elsewhere: Secondary | ICD-10-CM | POA: Insufficient documentation

## 2013-09-20 LAB — POCT HEMOGLOBIN-HEMACUE: Hemoglobin: 9.1 g/dL — ABNORMAL LOW (ref 12.0–15.0)

## 2013-09-20 MED ORDER — CLONIDINE HCL 0.1 MG PO TABS: 0.1000 mg | ORAL_TABLET | ORAL | Status: DC | PRN

## 2013-09-20 MED ORDER — EPOETIN ALFA 20000 UNIT/ML IJ SOLN
INTRAMUSCULAR | Status: AC
  Filled 2013-09-20: qty 1

## 2013-09-20 MED ORDER — EPOETIN ALFA 20000 UNIT/ML IJ SOLN
20000.0000 [IU] | INTRAMUSCULAR | Status: DC
  Administered 2013-09-20: 20000 [IU] via SUBCUTANEOUS

## 2013-10-04 ENCOUNTER — Encounter (HOSPITAL_COMMUNITY)
Admission: RE | Admit: 2013-10-04 | Discharge: 2013-10-04 | Disposition: A | Discharge status: Home / Self Care | Payer: PRIVATE HEALTH INSURANCE | Source: Ambulatory Visit | Attending: Nephrology | Admitting: Nephrology

## 2013-10-04 DIAGNOSIS — D638 Anemia in other chronic diseases classified elsewhere: Secondary | ICD-10-CM | POA: Diagnosis not present

## 2013-10-04 LAB — IRON AND TIBC
Iron: 59 ug/dL (ref 42–135)
Saturation Ratios: 29 % (ref 20–55)
TIBC: 202 ug/dL — AB (ref 250–470)
UIBC: 143 ug/dL (ref 125–400)

## 2013-10-04 LAB — POCT HEMOGLOBIN-HEMACUE: HEMOGLOBIN: 9.9 g/dL — AB (ref 12.0–15.0)

## 2013-10-04 LAB — FERRITIN: FERRITIN: 447 ng/mL — AB (ref 10–291)

## 2013-10-04 MED ORDER — EPOETIN ALFA 20000 UNIT/ML IJ SOLN
INTRAMUSCULAR | Status: AC
  Filled 2013-10-04: qty 1

## 2013-10-04 MED ORDER — EPOETIN ALFA 20000 UNIT/ML IJ SOLN
20000.0000 [IU] | INTRAMUSCULAR | Status: DC
  Administered 2013-10-04: 20000 [IU] via SUBCUTANEOUS

## 2013-10-18 ENCOUNTER — Encounter (HOSPITAL_COMMUNITY)
Admission: RE | Admit: 2013-10-18 | Discharge: 2013-10-18 | Disposition: A | Discharge status: Home / Self Care | Payer: PRIVATE HEALTH INSURANCE | Source: Ambulatory Visit | Attending: Nephrology | Admitting: Nephrology

## 2013-10-18 DIAGNOSIS — D649 Anemia, unspecified: Secondary | ICD-10-CM | POA: Insufficient documentation

## 2013-10-18 LAB — RENAL FUNCTION PANEL
Albumin: 3.5 g/dL (ref 3.5–5.2)
BUN: 64 mg/dL — AB (ref 6–23)
CHLORIDE: 105 meq/L (ref 96–112)
CO2: 20 mEq/L (ref 19–32)
Calcium: 10.3 mg/dL (ref 8.4–10.5)
Creatinine, Ser: 3.9 mg/dL — ABNORMAL HIGH (ref 0.50–1.10)
GFR calc Af Amer: 12 mL/min — ABNORMAL LOW (ref >90)
GFR calc non Af Amer: 10 mL/min — ABNORMAL LOW (ref >90)
GLUCOSE: 83 mg/dL (ref 70–99)
POTASSIUM: 3.3 meq/L — AB (ref 3.7–5.3)
Phosphorus: 4.3 mg/dL (ref 2.3–4.6)
Sodium: 144 mEq/L (ref 137–147)

## 2013-10-18 LAB — POCT HEMOGLOBIN-HEMACUE: HEMOGLOBIN: 9.4 g/dL — AB (ref 12.0–15.0)

## 2013-10-18 MED ORDER — EPOETIN ALFA 20000 UNIT/ML IJ SOLN
INTRAMUSCULAR | Status: AC
  Filled 2013-10-18: qty 1

## 2013-10-18 MED ORDER — CLONIDINE HCL 0.1 MG PO TABS: 0.1000 mg | ORAL_TABLET | ORAL | Status: DC | PRN

## 2013-10-18 MED ORDER — EPOETIN ALFA 20000 UNIT/ML IJ SOLN
20000.0000 [IU] | INTRAMUSCULAR | Status: DC
  Administered 2013-10-18: 20000 [IU] via SUBCUTANEOUS

## 2013-10-21 LAB — PTH, INTACT AND CALCIUM
CALCIUM TOTAL (PTH): 10 mg/dL (ref 8.4–10.5)
PTH: 492.9 pg/mL — AB (ref 14.0–72.0)

## 2013-11-01 ENCOUNTER — Encounter (HOSPITAL_COMMUNITY)
Admission: RE | Admit: 2013-11-01 | Discharge: 2013-11-01 | Disposition: A | Discharge status: Home / Self Care | Payer: PRIVATE HEALTH INSURANCE | Source: Ambulatory Visit | Attending: Nephrology | Admitting: Nephrology

## 2013-11-01 LAB — IRON AND TIBC
IRON: 55 ug/dL (ref 42–135)
SATURATION RATIOS: 24 % (ref 20–55)
TIBC: 226 ug/dL — AB (ref 250–470)
UIBC: 171 ug/dL (ref 125–400)

## 2013-11-01 LAB — FERRITIN: Ferritin: 464 ng/mL — ABNORMAL HIGH (ref 10–291)

## 2013-11-01 LAB — POCT HEMOGLOBIN-HEMACUE: Hemoglobin: 9.8 g/dL — ABNORMAL LOW (ref 12.0–15.0)

## 2013-11-01 MED ORDER — EPOETIN ALFA 20000 UNIT/ML IJ SOLN: 20000.0000 [IU] | INTRAMUSCULAR | Status: DC

## 2013-11-01 MED ORDER — EPOETIN ALFA 20000 UNIT/ML IJ SOLN
INTRAMUSCULAR | Status: AC
  Administered 2013-11-01: 20000 [IU] via SUBCUTANEOUS
  Filled 2013-11-01: qty 1

## 2013-11-01 MED ORDER — CLONIDINE HCL 0.1 MG PO TABS: 0.1000 mg | ORAL_TABLET | ORAL | Status: DC | PRN

## 2013-11-15 ENCOUNTER — Encounter (HOSPITAL_COMMUNITY)
Admission: RE | Admit: 2013-11-15 | Discharge: 2013-11-15 | Disposition: A | Discharge status: Home / Self Care | Payer: PRIVATE HEALTH INSURANCE | Source: Ambulatory Visit | Attending: Nephrology | Admitting: Nephrology

## 2013-11-15 DIAGNOSIS — D649 Anemia, unspecified: Secondary | ICD-10-CM | POA: Insufficient documentation

## 2013-11-15 LAB — POCT HEMOGLOBIN-HEMACUE: Hemoglobin: 9.3 g/dL — ABNORMAL LOW (ref 12.0–15.0)

## 2013-11-15 MED ORDER — EPOETIN ALFA 20000 UNIT/ML IJ SOLN
20000.0000 [IU] | INTRAMUSCULAR | Status: DC
  Administered 2013-11-15: 20000 [IU] via SUBCUTANEOUS

## 2013-11-15 MED ORDER — EPOETIN ALFA 20000 UNIT/ML IJ SOLN
INTRAMUSCULAR | Status: AC
  Administered 2013-11-15: 20000 [IU] via SUBCUTANEOUS
  Filled 2013-11-15: qty 1

## 2013-11-29 ENCOUNTER — Encounter (HOSPITAL_COMMUNITY)
Admission: RE | Admit: 2013-11-29 | Discharge: 2013-11-29 | Disposition: A | Discharge status: Home / Self Care | Payer: PRIVATE HEALTH INSURANCE | Source: Ambulatory Visit | Attending: Nephrology | Admitting: Nephrology

## 2013-11-29 LAB — IRON AND TIBC
IRON: 37 ug/dL — AB (ref 42–135)
Saturation Ratios: 17 % — ABNORMAL LOW (ref 20–55)
TIBC: 219 ug/dL — ABNORMAL LOW (ref 250–470)
UIBC: 182 ug/dL (ref 125–400)

## 2013-11-29 LAB — POCT HEMOGLOBIN-HEMACUE: Hemoglobin: 10 g/dL — ABNORMAL LOW (ref 12.0–15.0)

## 2013-11-29 LAB — FERRITIN: FERRITIN: 375 ng/mL — AB (ref 10–291)

## 2013-11-29 MED ORDER — CLONIDINE HCL 0.1 MG PO TABS: 0.1000 mg | ORAL_TABLET | ORAL | Status: DC | PRN

## 2013-11-29 MED ORDER — EPOETIN ALFA 20000 UNIT/ML IJ SOLN
INTRAMUSCULAR | Status: AC
  Filled 2013-11-29: qty 1

## 2013-11-29 MED ORDER — EPOETIN ALFA 20000 UNIT/ML IJ SOLN
20000.0000 [IU] | INTRAMUSCULAR | Status: DC
  Administered 2013-11-29: 20000 [IU] via SUBCUTANEOUS

## 2013-12-11 ENCOUNTER — Other Ambulatory Visit (HOSPITAL_COMMUNITY): Payer: Self-pay | Admitting: *Deleted

## 2013-12-12 ENCOUNTER — Encounter (HOSPITAL_COMMUNITY)
Admission: RE | Admit: 2013-12-12 | Discharge: 2013-12-12 | Disposition: A | Discharge status: Home / Self Care | Payer: PRIVATE HEALTH INSURANCE | Source: Ambulatory Visit | Attending: Nephrology | Admitting: Nephrology

## 2013-12-12 DIAGNOSIS — D649 Anemia, unspecified: Secondary | ICD-10-CM | POA: Insufficient documentation

## 2013-12-19 ENCOUNTER — Encounter (HOSPITAL_COMMUNITY)
Admission: RE | Admit: 2013-12-19 | Discharge: 2013-12-19 | Disposition: A | Discharge status: Home / Self Care | Payer: PRIVATE HEALTH INSURANCE | Source: Ambulatory Visit | Attending: Nephrology | Admitting: Nephrology

## 2013-12-19 MED ORDER — CLONIDINE HCL 0.1 MG PO TABS: 0.1000 mg | ORAL_TABLET | ORAL | Status: DC | PRN

## 2013-12-19 MED ORDER — EPOETIN ALFA 20000 UNIT/ML IJ SOLN
20000.0000 [IU] | INTRAMUSCULAR | Status: DC
  Administered 2013-12-19: 20000 [IU] via SUBCUTANEOUS

## 2013-12-19 MED ORDER — EPOETIN ALFA 20000 UNIT/ML IJ SOLN
INTRAMUSCULAR | Status: AC
  Administered 2013-12-19: 20000 [IU] via SUBCUTANEOUS
  Filled 2013-12-19: qty 1

## 2013-12-20 LAB — POCT HEMOGLOBIN-HEMACUE: Hemoglobin: 10.4 g/dL — ABNORMAL LOW (ref 12.0–15.0)

## 2014-01-02 ENCOUNTER — Encounter (HOSPITAL_COMMUNITY)
Admission: RE | Admit: 2014-01-02 | Discharge: 2014-01-02 | Disposition: A | Discharge status: Home / Self Care | Payer: PRIVATE HEALTH INSURANCE | Source: Ambulatory Visit | Attending: Nephrology | Admitting: Nephrology

## 2014-01-02 LAB — IRON AND TIBC
Iron: 56 ug/dL (ref 42–135)
Saturation Ratios: 26 % (ref 20–55)
TIBC: 217 ug/dL — AB (ref 250–470)
UIBC: 161 ug/dL (ref 125–400)

## 2014-01-02 LAB — FERRITIN: FERRITIN: 359 ng/mL — AB (ref 10–291)

## 2014-01-02 LAB — POCT HEMOGLOBIN-HEMACUE: Hemoglobin: 10.8 g/dL — ABNORMAL LOW (ref 12.0–15.0)

## 2014-01-02 MED ORDER — CLONIDINE HCL 0.1 MG PO TABS: 0.1000 mg | ORAL_TABLET | ORAL | Status: DC | PRN

## 2014-01-02 MED ORDER — EPOETIN ALFA 20000 UNIT/ML IJ SOLN
20000.0000 [IU] | INTRAMUSCULAR | Status: DC
  Administered 2014-01-02: 20000 [IU] via SUBCUTANEOUS

## 2014-01-02 MED ORDER — EPOETIN ALFA 20000 UNIT/ML IJ SOLN
INTRAMUSCULAR | Status: AC
  Filled 2014-01-02: qty 1

## 2014-01-16 ENCOUNTER — Encounter (HOSPITAL_COMMUNITY)
Admission: RE | Admit: 2014-01-16 | Discharge: 2014-01-16 | Disposition: A | Discharge status: Home / Self Care | Payer: PRIVATE HEALTH INSURANCE | Source: Ambulatory Visit | Attending: Nephrology | Admitting: Nephrology

## 2014-01-16 DIAGNOSIS — D649 Anemia, unspecified: Secondary | ICD-10-CM | POA: Insufficient documentation

## 2014-01-16 LAB — RENAL FUNCTION PANEL
ANION GAP: 19 — AB (ref 5–15)
Albumin: 3.8 g/dL (ref 3.5–5.2)
BUN: 57 mg/dL — AB (ref 6–23)
CHLORIDE: 103 meq/L (ref 96–112)
CO2: 21 mEq/L (ref 19–32)
Calcium: 10.2 mg/dL (ref 8.4–10.5)
Creatinine, Ser: 3.83 mg/dL — ABNORMAL HIGH (ref 0.50–1.10)
GFR calc Af Amer: 12 mL/min — ABNORMAL LOW (ref >90)
GFR calc non Af Amer: 10 mL/min — ABNORMAL LOW (ref >90)
Glucose, Bld: 82 mg/dL (ref 70–99)
Phosphorus: 4.3 mg/dL (ref 2.3–4.6)
Potassium: 3.5 mEq/L — ABNORMAL LOW (ref 3.7–5.3)
Sodium: 143 mEq/L (ref 137–147)

## 2014-01-16 LAB — POCT HEMOGLOBIN-HEMACUE: Hemoglobin: 10.5 g/dL — ABNORMAL LOW (ref 12.0–15.0)

## 2014-01-16 MED ORDER — CLONIDINE HCL 0.1 MG PO TABS: 0.1000 mg | ORAL_TABLET | ORAL | Status: DC | PRN

## 2014-01-16 MED ORDER — EPOETIN ALFA 20000 UNIT/ML IJ SOLN
20000.0000 [IU] | INTRAMUSCULAR | Status: DC
  Administered 2014-01-16: 20000 [IU] via SUBCUTANEOUS

## 2014-01-16 MED ORDER — EPOETIN ALFA 20000 UNIT/ML IJ SOLN
INTRAMUSCULAR | Status: AC
  Filled 2014-01-16: qty 1

## 2014-01-30 ENCOUNTER — Encounter (HOSPITAL_COMMUNITY)
Admission: RE | Admit: 2014-01-30 | Discharge: 2014-01-30 | Disposition: A | Discharge status: Home / Self Care | Payer: PRIVATE HEALTH INSURANCE | Source: Ambulatory Visit | Attending: Nephrology | Admitting: Nephrology

## 2014-01-30 DIAGNOSIS — D649 Anemia, unspecified: Secondary | ICD-10-CM | POA: Diagnosis not present

## 2014-01-30 LAB — IRON AND TIBC
Iron: 26 ug/dL — ABNORMAL LOW (ref 42–135)
Saturation Ratios: 12 % — ABNORMAL LOW (ref 20–55)
TIBC: 213 ug/dL — ABNORMAL LOW (ref 250–470)
UIBC: 187 ug/dL (ref 125–400)

## 2014-01-30 LAB — FERRITIN: Ferritin: 454 ng/mL — ABNORMAL HIGH (ref 10–291)

## 2014-01-30 LAB — POCT HEMOGLOBIN-HEMACUE: HEMOGLOBIN: 10.9 g/dL — AB (ref 12.0–15.0)

## 2014-01-30 MED ORDER — EPOETIN ALFA 20000 UNIT/ML IJ SOLN
20000.0000 [IU] | INTRAMUSCULAR | Status: DC
  Administered 2014-01-30: 20000 [IU] via SUBCUTANEOUS

## 2014-01-30 MED ORDER — EPOETIN ALFA 20000 UNIT/ML IJ SOLN
INTRAMUSCULAR | Status: AC
  Filled 2014-01-30: qty 1

## 2014-01-30 MED ORDER — CLONIDINE HCL 0.1 MG PO TABS: 0.1000 mg | ORAL_TABLET | ORAL | Status: DC | PRN

## 2014-02-12 ENCOUNTER — Other Ambulatory Visit (HOSPITAL_COMMUNITY): Payer: Self-pay

## 2014-02-13 ENCOUNTER — Encounter (HOSPITAL_COMMUNITY)
Admission: RE | Admit: 2014-02-13 | Discharge: 2014-02-13 | Disposition: A | Discharge status: Home / Self Care | Payer: PRIVATE HEALTH INSURANCE | Source: Ambulatory Visit | Attending: Nephrology | Admitting: Nephrology

## 2014-02-13 DIAGNOSIS — D649 Anemia, unspecified: Secondary | ICD-10-CM | POA: Diagnosis not present

## 2014-02-13 LAB — POCT HEMOGLOBIN-HEMACUE: HEMOGLOBIN: 10.9 g/dL — AB (ref 12.0–15.0)

## 2014-02-13 MED ORDER — SODIUM CHLORIDE 0.9 % IV SOLN
1020.0000 mg | Freq: Once | INTRAVENOUS | Status: AC
  Administered 2014-02-13: 1020 mg via INTRAVENOUS
  Filled 2014-02-13: qty 34

## 2014-02-13 MED ORDER — EPOETIN ALFA 20000 UNIT/ML IJ SOLN
20000.0000 [IU] | INTRAMUSCULAR | Status: DC
  Administered 2014-02-13: 20000 [IU] via SUBCUTANEOUS

## 2014-02-13 MED ORDER — CLONIDINE HCL 0.1 MG PO TABS: 0.1000 mg | ORAL_TABLET | ORAL | Status: DC | PRN

## 2014-02-13 MED ORDER — EPOETIN ALFA 20000 UNIT/ML IJ SOLN
INTRAMUSCULAR | Status: AC
  Filled 2014-02-13: qty 1

## 2014-02-27 ENCOUNTER — Encounter (HOSPITAL_COMMUNITY)
Admission: RE | Admit: 2014-02-27 | Discharge: 2014-02-27 | Disposition: A | Discharge status: Home / Self Care | Payer: PRIVATE HEALTH INSURANCE | Source: Ambulatory Visit | Attending: Nephrology | Admitting: Nephrology

## 2014-02-27 DIAGNOSIS — D649 Anemia, unspecified: Secondary | ICD-10-CM | POA: Diagnosis not present

## 2014-02-27 LAB — RENAL FUNCTION PANEL
Albumin: 3.6 g/dL (ref 3.5–5.2)
Anion gap: 18 — ABNORMAL HIGH (ref 5–15)
BUN: 74 mg/dL — ABNORMAL HIGH (ref 6–23)
CO2: 19 mEq/L (ref 19–32)
Calcium: 10.2 mg/dL (ref 8.4–10.5)
Chloride: 105 mEq/L (ref 96–112)
Creatinine, Ser: 4.21 mg/dL — ABNORMAL HIGH (ref 0.50–1.10)
GFR, EST AFRICAN AMERICAN: 11 mL/min — AB (ref >90)
GFR, EST NON AFRICAN AMERICAN: 9 mL/min — AB (ref >90)
GLUCOSE: 86 mg/dL (ref 70–99)
PHOSPHORUS: 4.4 mg/dL (ref 2.3–4.6)
Potassium: 3.2 mEq/L — ABNORMAL LOW (ref 3.7–5.3)
SODIUM: 142 meq/L (ref 137–147)

## 2014-02-27 LAB — FERRITIN: Ferritin: 823 ng/mL — ABNORMAL HIGH (ref 10–291)

## 2014-02-27 LAB — IRON AND TIBC
Iron: 75 ug/dL (ref 42–135)
Saturation Ratios: 36 % (ref 20–55)
TIBC: 207 ug/dL — AB (ref 250–470)
UIBC: 132 ug/dL (ref 125–400)

## 2014-02-27 LAB — POCT HEMOGLOBIN-HEMACUE: Hemoglobin: 11.2 g/dL — ABNORMAL LOW (ref 12.0–15.0)

## 2014-02-27 MED ORDER — EPOETIN ALFA 20000 UNIT/ML IJ SOLN
20000.0000 [IU] | INTRAMUSCULAR | Status: DC
  Administered 2014-02-27: 20000 [IU] via SUBCUTANEOUS

## 2014-02-27 MED ORDER — EPOETIN ALFA 20000 UNIT/ML IJ SOLN
INTRAMUSCULAR | Status: AC
  Filled 2014-02-27: qty 1

## 2014-02-27 MED ORDER — CLONIDINE HCL 0.1 MG PO TABS: 0.1000 mg | ORAL_TABLET | ORAL | Status: DC | PRN

## 2014-03-13 ENCOUNTER — Encounter (HOSPITAL_COMMUNITY)
Admission: RE | Admit: 2014-03-13 | Discharge: 2014-03-13 | Disposition: A | Discharge status: Home / Self Care | Payer: PRIVATE HEALTH INSURANCE | Source: Ambulatory Visit | Attending: Nephrology | Admitting: Nephrology

## 2014-03-13 DIAGNOSIS — D649 Anemia, unspecified: Secondary | ICD-10-CM | POA: Diagnosis not present

## 2014-03-13 LAB — POCT HEMOGLOBIN-HEMACUE: HEMOGLOBIN: 10.6 g/dL — AB (ref 12.0–15.0)

## 2014-03-13 MED ORDER — EPOETIN ALFA 20000 UNIT/ML IJ SOLN
INTRAMUSCULAR | Status: AC
  Administered 2014-03-13: 20000 [IU] via SUBCUTANEOUS
  Filled 2014-03-13: qty 1

## 2014-03-13 MED ORDER — CLONIDINE HCL 0.1 MG PO TABS: 0.1000 mg | ORAL_TABLET | ORAL | Status: DC | PRN

## 2014-03-13 MED ORDER — EPOETIN ALFA 20000 UNIT/ML IJ SOLN
20000.0000 [IU] | INTRAMUSCULAR | Status: DC
  Administered 2014-03-13: 20000 [IU] via SUBCUTANEOUS

## 2014-03-27 ENCOUNTER — Encounter (HOSPITAL_COMMUNITY)
Admission: RE | Admit: 2014-03-27 | Discharge: 2014-03-27 | Disposition: A | Discharge status: Home / Self Care | Payer: PRIVATE HEALTH INSURANCE | Source: Ambulatory Visit | Attending: Nephrology | Admitting: Nephrology

## 2014-03-27 DIAGNOSIS — D649 Anemia, unspecified: Secondary | ICD-10-CM | POA: Diagnosis not present

## 2014-03-27 LAB — IRON AND TIBC
IRON: 42 ug/dL (ref 42–135)
Saturation Ratios: 22 % (ref 20–55)
TIBC: 194 ug/dL — ABNORMAL LOW (ref 250–470)
UIBC: 152 ug/dL (ref 125–400)

## 2014-03-27 LAB — POCT HEMOGLOBIN-HEMACUE: Hemoglobin: 10.2 g/dL — ABNORMAL LOW (ref 12.0–15.0)

## 2014-03-27 MED ORDER — EPOETIN ALFA 20000 UNIT/ML IJ SOLN
INTRAMUSCULAR | Status: AC
  Administered 2014-03-27: 20000 [IU] via SUBCUTANEOUS
  Filled 2014-03-27: qty 1

## 2014-03-27 MED ORDER — EPOETIN ALFA 20000 UNIT/ML IJ SOLN: 20000.0000 [IU] | INTRAMUSCULAR | Status: DC

## 2014-03-27 MED ORDER — CLONIDINE HCL 0.1 MG PO TABS: 0.1000 mg | ORAL_TABLET | ORAL | Status: DC | PRN

## 2014-03-28 LAB — FERRITIN: FERRITIN: 633 ng/mL — AB (ref 10–291)

## 2014-04-09 ENCOUNTER — Other Ambulatory Visit (HOSPITAL_COMMUNITY): Payer: Self-pay | Admitting: *Deleted

## 2014-04-10 ENCOUNTER — Encounter (HOSPITAL_COMMUNITY)
Admission: RE | Admit: 2014-04-10 | Discharge: 2014-04-10 | Disposition: A | Discharge status: Home / Self Care | Payer: PRIVATE HEALTH INSURANCE | Source: Ambulatory Visit | Attending: Nephrology | Admitting: Nephrology

## 2014-04-10 DIAGNOSIS — N184 Chronic kidney disease, stage 4 (severe): Secondary | ICD-10-CM | POA: Diagnosis not present

## 2014-04-10 DIAGNOSIS — D631 Anemia in chronic kidney disease: Secondary | ICD-10-CM | POA: Insufficient documentation

## 2014-04-10 LAB — POCT HEMOGLOBIN-HEMACUE: Hemoglobin: 10.3 g/dL — ABNORMAL LOW (ref 12.0–15.0)

## 2014-04-10 MED ORDER — SODIUM CHLORIDE 0.9 % IV SOLN
1020.0000 mg | Freq: Once | INTRAVENOUS | Status: AC
  Administered 2014-04-10: 1020 mg via INTRAVENOUS
  Filled 2014-04-10: qty 34

## 2014-04-10 MED ORDER — EPOETIN ALFA 20000 UNIT/ML IJ SOLN: 20000.0000 [IU] | INTRAMUSCULAR | Status: DC

## 2014-04-10 MED ORDER — EPOETIN ALFA 20000 UNIT/ML IJ SOLN
INTRAMUSCULAR | Status: AC
  Administered 2014-04-10: 20000 [IU] via SUBCUTANEOUS
  Filled 2014-04-10: qty 1

## 2014-04-10 MED ORDER — CLONIDINE HCL 0.1 MG PO TABS: 0.1000 mg | ORAL_TABLET | ORAL | Status: DC | PRN

## 2014-04-11 LAB — PTH, INTACT AND CALCIUM
Calcium, Total (PTH): 9.6 mg/dL (ref 8.4–10.5)
PTH: 474 pg/mL — ABNORMAL HIGH (ref 14–64)

## 2014-04-24 ENCOUNTER — Encounter (HOSPITAL_COMMUNITY): Payer: PRIVATE HEALTH INSURANCE

## 2014-05-01 ENCOUNTER — Encounter (HOSPITAL_COMMUNITY)
Admission: RE | Admit: 2014-05-01 | Discharge: 2014-05-01 | Disposition: A | Discharge status: Home / Self Care | Payer: PRIVATE HEALTH INSURANCE | Source: Ambulatory Visit | Attending: Nephrology | Admitting: Nephrology

## 2014-05-01 DIAGNOSIS — D631 Anemia in chronic kidney disease: Secondary | ICD-10-CM | POA: Diagnosis not present

## 2014-05-01 LAB — POCT HEMOGLOBIN-HEMACUE: Hemoglobin: 10.7 g/dL — ABNORMAL LOW (ref 12.0–15.0)

## 2014-05-01 LAB — IRON AND TIBC
IRON: 35 ug/dL — AB (ref 42–135)
Saturation Ratios: 19 % — ABNORMAL LOW (ref 20–55)
TIBC: 189 ug/dL — AB (ref 250–470)
UIBC: 154 ug/dL (ref 125–400)

## 2014-05-01 LAB — FERRITIN: Ferritin: 776 ng/mL — ABNORMAL HIGH (ref 10–291)

## 2014-05-01 MED ORDER — EPOETIN ALFA 20000 UNIT/ML IJ SOLN
INTRAMUSCULAR | Status: AC
  Filled 2014-05-01: qty 1

## 2014-05-01 MED ORDER — EPOETIN ALFA 20000 UNIT/ML IJ SOLN
20000.0000 [IU] | INTRAMUSCULAR | Status: DC
  Administered 2014-05-01: 20000 [IU] via SUBCUTANEOUS

## 2014-05-01 MED ORDER — CLONIDINE HCL 0.1 MG PO TABS: 0.1000 mg | ORAL_TABLET | ORAL | Status: DC | PRN

## 2014-05-09 ENCOUNTER — Emergency Department (HOSPITAL_COMMUNITY)
Admission: EM | Admit: 2014-05-09 | Discharge: 2014-05-10 | Disposition: A | Discharge status: Home / Self Care | Payer: PRIVATE HEALTH INSURANCE | Attending: Emergency Medicine | Admitting: Emergency Medicine

## 2014-05-09 ENCOUNTER — Encounter (HOSPITAL_COMMUNITY): Payer: Self-pay | Admitting: Emergency Medicine

## 2014-05-09 DIAGNOSIS — Z7982 Long term (current) use of aspirin: Secondary | ICD-10-CM | POA: Diagnosis not present

## 2014-05-09 DIAGNOSIS — I10 Essential (primary) hypertension: Secondary | ICD-10-CM | POA: Insufficient documentation

## 2014-05-09 DIAGNOSIS — M2550 Pain in unspecified joint: Secondary | ICD-10-CM | POA: Diagnosis present

## 2014-05-09 DIAGNOSIS — M109 Gout, unspecified: Secondary | ICD-10-CM | POA: Insufficient documentation

## 2014-05-09 DIAGNOSIS — Z79899 Other long term (current) drug therapy: Secondary | ICD-10-CM | POA: Insufficient documentation

## 2014-05-09 DIAGNOSIS — Z8639 Personal history of other endocrine, nutritional and metabolic disease: Secondary | ICD-10-CM | POA: Insufficient documentation

## 2014-05-09 DIAGNOSIS — N19 Unspecified kidney failure: Secondary | ICD-10-CM | POA: Insufficient documentation

## 2014-05-09 LAB — COMPREHENSIVE METABOLIC PANEL
ALT: 13 U/L (ref 0–35)
AST: 16 U/L (ref 0–37)
Albumin: 3.1 g/dL — ABNORMAL LOW (ref 3.5–5.2)
Alkaline Phosphatase: 95 U/L (ref 39–117)
Anion gap: 25 — ABNORMAL HIGH (ref 5–15)
BILIRUBIN TOTAL: 0.4 mg/dL (ref 0.3–1.2)
BUN: 84 mg/dL — ABNORMAL HIGH (ref 6–23)
CALCIUM: 9.9 mg/dL (ref 8.4–10.5)
CO2: 12 meq/L — AB (ref 19–32)
CREATININE: 6.18 mg/dL — AB (ref 0.50–1.10)
Chloride: 99 mEq/L (ref 96–112)
GFR calc non Af Amer: 6 mL/min — ABNORMAL LOW (ref >90)
GFR, EST AFRICAN AMERICAN: 7 mL/min — AB (ref >90)
Glucose, Bld: 145 mg/dL — ABNORMAL HIGH (ref 70–99)
Potassium: 3.1 mEq/L — ABNORMAL LOW (ref 3.7–5.3)
SODIUM: 136 meq/L — AB (ref 137–147)
Total Protein: 7.8 g/dL (ref 6.0–8.3)

## 2014-05-09 LAB — CBC WITH DIFFERENTIAL/PLATELET
BASOS PCT: 0 % (ref 0–1)
Basophils Absolute: 0 10*3/uL (ref 0.0–0.1)
EOS ABS: 0 10*3/uL (ref 0.0–0.7)
Eosinophils Relative: 0 % (ref 0–5)
HEMATOCRIT: 35 % — AB (ref 36.0–46.0)
HEMOGLOBIN: 11.7 g/dL — AB (ref 12.0–15.0)
Lymphocytes Relative: 7 % — ABNORMAL LOW (ref 12–46)
Lymphs Abs: 1 10*3/uL (ref 0.7–4.0)
MCH: 29.5 pg (ref 26.0–34.0)
MCHC: 33.4 g/dL (ref 30.0–36.0)
MCV: 88.2 fL (ref 78.0–100.0)
MONO ABS: 0.7 10*3/uL (ref 0.1–1.0)
Monocytes Relative: 5 % (ref 3–12)
Neutro Abs: 13.1 10*3/uL — ABNORMAL HIGH (ref 1.7–7.7)
Neutrophils Relative %: 88 % — ABNORMAL HIGH (ref 43–77)
Platelets: 301 10*3/uL (ref 150–400)
RBC: 3.97 MIL/uL (ref 3.87–5.11)
RDW: 15.6 % — ABNORMAL HIGH (ref 11.5–15.5)
WBC: 14.8 10*3/uL — ABNORMAL HIGH (ref 4.0–10.5)

## 2014-05-09 NOTE — ED Notes (Signed)
Pt. reports lower legs swelling for several days ,  Joints pain ( wrists and knees ) with swelling onset 2 days ago , denies fall or injury/ respirations unlabored .

## 2014-05-10 ENCOUNTER — Emergency Department (HOSPITAL_COMMUNITY): Payer: PRIVATE HEALTH INSURANCE

## 2014-05-10 DIAGNOSIS — M109 Gout, unspecified: Secondary | ICD-10-CM | POA: Diagnosis not present

## 2014-05-10 LAB — URIC ACID: Uric Acid, Serum: 12.4 mg/dL — ABNORMAL HIGH (ref 2.4–7.0)

## 2014-05-10 MED ORDER — PREDNISONE 20 MG PO TABS: 40.0000 mg | ORAL_TABLET | Freq: Every day | ORAL | Status: DC

## 2014-05-10 MED ORDER — PREDNISONE 20 MG PO TABS
40.0000 mg | ORAL_TABLET | Freq: Once | ORAL | Status: AC
Start: 2014-05-10 — End: 2014-05-10
  Administered 2014-05-10: 40 mg via ORAL
  Filled 2014-05-10: qty 2

## 2014-05-10 MED ORDER — HYDROCODONE-ACETAMINOPHEN 5-325 MG PO TABS
1.0000 | ORAL_TABLET | ORAL | Status: DC | PRN
  Administered 2014-05-10: 1 via ORAL
  Filled 2014-05-10: qty 1

## 2014-05-10 MED ORDER — HYDROCODONE-ACETAMINOPHEN 5-325 MG PO TABS: 1.0000 | ORAL_TABLET | ORAL | Status: DC | PRN

## 2014-05-10 NOTE — ED Provider Notes (Signed)
CSN: [id number]     Arrival date & time 05/09/14  1910 History   First MD Initiated Contact with Patient 05/10/14 0001     Chief Complaint  Patient presents with  . Joint Pain  . Leg Swelling     (Consider location/radiation/quality/duration/timing/severity/associated sxs/prior Treatment) HPI 80 year old female presents to the emergency department with complaint of multiple joint swelling and pain.  Patient reports she has history of gout, and swelling/pain feels like a gout flare.  Patient also has history of renal insufficiency, receives Neupogen shots and is followed monthly by nephrology.  She denies any current chest pain or shortness of breath.  She is not currently on any pain medications.  Daughter's report the patient has had difficulties getting around secondary to pain. Past Medical History  Diagnosis Date  . Gout   . Renal insufficiency   . Hypertension   . Hyperlipemia    Past Surgical History  Procedure Laterality Date  . Tubal ligation    . Av fistula placement     Family History  Problem Relation Age of Onset  . Colon cancer Neg Hx    History  Substance Use Topics  . Smoking status: Never Smoker   . Smokeless tobacco: Never Used  . Alcohol Use: No   OB History   Grav Para Term Preterm Abortions TAB SAB Ect Mult Living                 Review of Systems   See History of Present Illness; otherwise all other systems are reviewed and negative  Allergies  Nsaids  Home Medications   Prior to Admission medications   Medication Sig Start Date End Date Taking? Authorizing Provider  albuterol (PROAIR HFA) 108 (90 BASE) MCG/ACT inhaler Inhale 1 puff into the lungs at bedtime.   Yes Historical Provider, MD  amLODipine (NORVASC) 5 MG tablet Take 5 mg by mouth daily.   Yes Historical Provider, MD  aspirin EC 81 MG tablet Take 81 mg by mouth daily.   Yes Historical Provider, MD  cilostazol (PLETAL) 50 MG tablet Take 50 mg by mouth 2 (two) times daily.   Yes  Historical Provider, MD  furosemide (LASIX) 20 MG tablet Take 20 mg by mouth daily.    Yes Historical Provider, MD  metoprolol succinate (TOPROL-XL) 25 MG 24 hr tablet Take 25 mg by mouth daily.   Yes Historical Provider, MD  nitroGLYCERIN (NITROSTAT) 0.4 MG SL tablet Place 0.4 mg under the tongue every 5 (five) minutes as needed for chest pain.   Yes Historical Provider, MD   BP 104/62  Pulse 78  Temp(Src) 98.3 F (36.8 C) (Oral)  Resp 14  SpO2 100% Physical Exam  Nursing note and vitals reviewed. Constitutional: She is oriented to person, place, and time. She appears well-developed and well-nourished.  HENT:  Head: Normocephalic and atraumatic.  Nose: Nose normal.  Mouth/Throat: Oropharynx is clear and moist.  Eyes: Conjunctivae and EOM are normal. Pupils are equal, round, and reactive to light.  Neck: Normal range of motion. Neck supple. No JVD present. No tracheal deviation present. No thyromegaly present.  Cardiovascular: Normal rate, regular rhythm, normal heart sounds and intact distal pulses.  Exam reveals no gallop and no friction rub.   No murmur heard. Pulmonary/Chest: Effort normal and breath sounds normal. No stridor. No respiratory distress. She has no wheezes. She has no rales. She exhibits no tenderness.  Abdominal: Soft. Bowel sounds are normal. She exhibits no distension and no mass. There  is no tenderness. There is no rebound and no guarding.  Musculoskeletal: Normal range of motion. She exhibits edema and tenderness.  Patient has edema and warmth to right wrist and bilateral knees  Lymphadenopathy:    She has no cervical adenopathy.  Neurological: She is alert and oriented to person, place, and time. She displays normal reflexes. She exhibits normal muscle tone. Coordination normal.  Skin: Skin is warm and dry. No rash noted. No erythema. No pallor.  Psychiatric: She has a normal mood and affect. Her behavior is normal. Judgment and thought content normal.    ED  Course  Procedures (including critical care time) Labs Review Labs Reviewed  CBC WITH DIFFERENTIAL - Abnormal; Notable for the following:    WBC 14.8 (*)    Hemoglobin 11.7 (*)    HCT 35.0 (*)    RDW 15.6 (*)    Neutrophils Relative % 88 (*)    Lymphocytes Relative 7 (*)    Neutro Abs 13.1 (*)    All other components within normal limits  COMPREHENSIVE METABOLIC PANEL - Abnormal; Notable for the following:    Sodium 136 (*)    Potassium 3.1 (*)    CO2 12 (*)    Glucose, Bld 145 (*)    BUN 84 (*)    Creatinine, Ser 6.18 (*)    Albumin 3.1 (*)    GFR calc non Af Amer 6 (*)    GFR calc Af Amer 7 (*)    Anion gap 25 (*)    All other components within normal limits  URIC ACID - Abnormal; Notable for the following:    Uric Acid, Serum 12.4 (*)    All other components within normal limits  URINALYSIS, ROUTINE W REFLEX MICROSCOPIC    Imaging Review No results found.   EKG Interpretation None      MDM   Final diagnoses:  Renal failure  Acute gout of multiple sites, unspecified cause    80 year old female with what appears to be a gout flare of multiple joints.  Patient's creatinine is 6.18 which is elevated from her last ED value of 4.21.  Patient does not appear to be fluid overloaded.  We'll check a chest x-ray.  We'll touch base with [facility].  Will treat for gout flare.  We'll have her follow-up with her primary care doctor on Monday.  3:07 AM Case discussed with oncall nephrology who recommends close f/u in office this week for lab recheck and possible initiation of dialysis.  They will let Dr Arrie Aranoladonato know of pt's status.  Olivia Mackielga M Madalene Mickler, MD 05/10/14 [phone]

## 2014-05-10 NOTE — Discharge Instructions (Signed)
Increase your fluid intake over the weekend.  Take medications as prescribed.  Contact your kidney doctor on Monday.  Your kidney function is worsening, and you may need to start on dialysis soon.  Return to the ER for worsening condition or new concerning symptoms.     End-Stage Kidney Disease The kidneys are two organs that lie on either side of the spine between the middle of the back and the front of the abdomen. The kidneys:   Remove wastes and extra water from the blood.   Produce important hormones. These help keep bones strong, regulate blood pressure, and help create red blood cells.   Balance the fluids and chemicals in the blood and tissues. End-stage kidney disease occurs when the kidneys are so damaged that they cannot do their job. When the kidneys cannot do their job, life-threatening problems occur. The body cannot stay clean and strong without the help of the kidneys. In end-stage kidney disease, the kidneys cannot get better.You need a new kidney or treatments to do some of the work healthy kidneys do in order to stay alive. CAUSES  End-stage kidney disease usually occurs when a long-lasting (chronic) kidney disease gets worse. It may also occur after the kidneys are suddenly damaged (acute kidney injury).  SYMPTOMS   Swelling (edema) of the legs, ankles, or feet.   Tiredness (lethargy).   Nausea or vomiting.   Confusion.   Problems with urination, such as:   Decreased urine production.   Frequent urination, especially at night.   Frequent accidents in children who are potty trained.   Muscle twitches and cramps.   Persistent itchiness.   Loss of appetite.   Headaches.   Abnormally dark or light skin.   Numbness in the hands or feet.   Easy bruising.   Frequent hiccups.   Menstruation stops. DIAGNOSIS  Your health care provider will measure your blood pressure and take some tests. These may include:   Urine tests.   Blood  tests.   Imaging tests, such as:   An ultrasound exam.   Computed tomography (CT).  A kidney biopsy. TREATMENT  There are two treatments for end-stage kidney disease:   A procedure that removes toxic wastes from the body (dialysis).   Receiving a new kidney (kidney transplant). Both of these treatments have serious risks and consequences. Your health care provider will help you determine which treatment is best for you based on your health, age, and other factors. In addition to having dialysis or a kidney transplant, you may need to take medicines to control high blood pressure (hypertension) and cholesterol and to decrease phosphorus levels in your blood.  HOME CARE INSTRUCTIONS  Follow your prescribed diet.   Take medicines only as directed by your health care provider.   Do not take any new medicines (prescription, over-the-counter, or nutritional supplements) unless approved by your health care provider. Many medicines can worsen your kidney damage or need to have the dose adjusted.   Keep all follow-up visits as directed by your health care provider. MAKE SURE YOU:  Understand these instructions.  Will watch your condition.  Will get help right away if you are not doing well or get worse. Document Released: 09/17/2003 Document Revised: 11/11/2013 Document Reviewed: 02/24/2012 [facility] Patient Information 2015 [place], [place]. This information is not intended to replace advice given to you by your health care provider. Make sure you discuss any questions you have with your health care provider.  Gout Gout is an inflammatory arthritis  caused by a buildup of uric acid crystals in the joints. Uric acid is a chemical that is normally present in the blood. When the level of uric acid in the blood is too high it can form crystals that deposit in your joints and tissues. This causes joint redness, soreness, and swelling (inflammation). Repeat attacks are common. Over time,  uric acid crystals can form into masses (tophi) near a joint, destroying bone and causing disfigurement. Gout is treatable and often preventable. CAUSES  The disease begins with elevated levels of uric acid in the blood. Uric acid is produced by your body when it breaks down a naturally found substance called purines. Certain foods you eat, such as meats and fish, contain high amounts of purines. Causes of an elevated uric acid level include:  Being passed down from parent to child (heredity).  Diseases that cause increased uric acid production (such as obesity, psoriasis, and certain cancers).  Excessive alcohol use.  Diet, especially diets rich in meat and seafood.  Medicines, including certain cancer-fighting medicines (chemotherapy), water pills (diuretics), and aspirin.  Chronic kidney disease. The kidneys are no longer able to remove uric acid well.  Problems with metabolism. Conditions strongly associated with gout include:  Obesity.  High blood pressure.  High cholesterol.  Diabetes. Not everyone with elevated uric acid levels gets gout. It is not understood why some people get gout and others do not. Surgery, joint injury, and eating too much of certain foods are some of the factors that can lead to gout attacks. SYMPTOMS   An attack of gout comes on quickly. It causes intense pain with redness, swelling, and warmth in a joint.  Fever can occur.  Often, only one joint is involved. Certain joints are more commonly involved:  Base of the big toe.  Knee.  Ankle.  Wrist.  Finger. Without treatment, an attack usually goes away in a few days to weeks. Between attacks, you usually will not have symptoms, which is different from many other forms of arthritis. DIAGNOSIS  Your caregiver will suspect gout based on your symptoms and exam. In some cases, tests may be recommended. The tests may include:  Blood tests.  Urine tests.  X-rays.  Joint fluid exam. This exam  requires a needle to remove fluid from the joint (arthrocentesis). Using a microscope, gout is confirmed when uric acid crystals are seen in the joint fluid. TREATMENT  There are two phases to gout treatment: treating the sudden onset (acute) attack and preventing attacks (prophylaxis).  Treatment of an Acute Attack.  Medicines are used. These include anti-inflammatory medicines or steroid medicines.  An injection of steroid medicine into the affected joint is sometimes necessary.  The painful joint is rested. Movement can worsen the arthritis.  You may use warm or cold treatments on painful joints, depending which works best for you.  Treatment to Prevent Attacks.  If you suffer from frequent gout attacks, your caregiver may advise preventive medicine. These medicines are started after the acute attack subsides. These medicines either help your kidneys eliminate uric acid from your body or decrease your uric acid production. You may need to stay on these medicines for a very long time.  The early phase of treatment with preventive medicine can be associated with an increase in acute gout attacks. For this reason, during the first few months of treatment, your caregiver may also advise you to take medicines usually used for acute gout treatment. Be sure you understand your caregiver's directions. Your  caregiver may make several adjustments to your medicine dose before these medicines are effective.  Discuss dietary treatment with your caregiver or dietitian. Alcohol and drinks high in sugar and fructose and foods such as meat, poultry, and seafood can increase uric acid levels. Your caregiver or dietitian can advise you on drinks and foods that should be limited. HOME CARE INSTRUCTIONS   Do not take aspirin to relieve pain. This raises uric acid levels.  Only take over-the-counter or prescription medicines for pain, discomfort, or fever as directed by your caregiver.  Rest the joint as  much as possible. When in bed, keep sheets and blankets off painful areas.  Keep the affected joint raised (elevated).  Apply warm or cold treatments to painful joints. Use of warm or cold treatments depends on which works best for you.  Use crutches if the painful joint is in your leg.  Drink enough fluids to keep your urine clear or pale yellow. This helps your body get rid of uric acid. Limit alcohol, sugary drinks, and fructose drinks.  Follow your dietary instructions. Pay careful attention to the amount of protein you eat. Your daily diet should emphasize fruits, vegetables, whole grains, and fat-free or low-fat milk products. Discuss the use of coffee, vitamin C, and cherries with your caregiver or dietitian. These may be helpful in lowering uric acid levels.  Maintain a healthy body weight. SEEK MEDICAL CARE IF:   You develop diarrhea, vomiting, or any side effects from medicines.  You do not feel better in 24 hours, or you are getting worse. SEEK IMMEDIATE MEDICAL CARE IF:   Your joint becomes suddenly more tender, and you have chills or a fever. MAKE SURE YOU:   Understand these instructions.  Will watch your condition.  Will get help right away if you are not doing well or get worse. Document Released: 06/24/2000 Document Revised: 11/11/2013 Document Reviewed: 02/08/2012 [facility] Patient Information 2015 [place], [place]. This information is not intended to replace advice given to you by your health care provider. Make sure you discuss any questions you have with your health care provider.   Food Basics for Chronic Kidney Disease When your kidneys are not working well, they cannot remove waste and excess substances from your blood as effectively as they did before. This can lead to a buildup and imbalance of these substances, which can affect how your body functions. This buildup can also make your kidneys work harder, causing even more damage. You may need to eat less of  certain foods that can lead to the buildup of these substances in your body. By making the changes to your diet that are recommended by your dietitian or health care provider, you could possibly help prevent further kidney damage and delay or prevent the need for dialysis. The following information can help give you a basic understanding of these substances and how they affect your bodily functions. The information also gives examples of foods that contain the highest amounts of these substances. WHAT DO I NEED TO KNOW ABOUT SUBSTANCES IN MY FOOD THAT I MAY NEED TO ADJUST? Food adjustments will be different for each person with chronic kidney disease. It is important that you see a dietitian who can help you determine the specific adjustments that you will need to make for each of the following substances: Potassium Potassium affects how steadily your heart beats. If too much potassium builds up in your blood, it can cause an irregular heartbeat or even a heart attack. Examples of  foods rich in potassium include:  Milk.  Fruits.  Vegetables. Phosphorus Phosphorus is a mineral found in your bones. A balance between calcium and phosphorous is needed to build and maintain healthy bones. Too much phosphorus pulls calcium from your bones. This can make your bones weak and more likely to break. Too much phosphorus can also make your skin itch. Examples of foods rich in phosphorus include:  Milk and cheese.  Dried beans.  Peas.  Colas.  Nuts and peanut butter. Animal Protein Animal protein helps you make and keep muscle. It also helps in the repair of your body's cells and tissues. One of the natural breakdown products of protein is a waste product called urea. When your kidneys are not working properly, they cannot remove wastes such as urea like they did before you developed chronic kidney disease. You will likely need to limit the amount of protein you eat to help prevent a buildup of urea in  your blood. Examples of animal protein include:  Meat (all types).  Fish and seafood.  Poultry.  Eggs. Sodium Sodium, which is found in salt, helps maintain a healthy balance of fluids in your body. Too much sodium can increase your blood pressure level and have a negative affect on the function of your heart and lungs. Too much sodium also can cause your body to retain too much fluid, making your kidneys work harder. Examples of foods with high levels of sodium include:  Salt seasonings.  Soy sauce.  Cured and processed meats.  Salted crackers and snack foods.  Fast food.  Canned soups and most canned foods. Glucose Glucose provides energy for your body. If you have diabetes mellitus that is not properly controlled, you have too much glucose in your blood. Too much glucose in your blood can worsen the function of your kidneys by damaging small blood vessels. This prevents enough blood flow to your kidneys to give them what they need to work. If you have diabetes mellitus and chronic kidney disease, it is important to maintain your blood glucose at a level recommended by your health care provider. SHOULD I TAKE A VITAMIN AND MINERAL SUPPLEMENT? Because you may need to avoid eating certain foods, you may not get all of the vitamins and minerals that would normally come from those foods. Your health care provider or dietitian may recommend that you take a supplement to ensure that you get all of the vitamins and minerals that your body needs.  Document Released: 09/17/2002 Document Revised: 11/11/2013 Document Reviewed: 05/24/2013 [facility] Patient Information 2015 [place], [place]. This information is not intended to replace advice given to you by your health care provider. Make sure you discuss any questions you have with your health care provider.

## 2014-05-15 ENCOUNTER — Encounter (HOSPITAL_COMMUNITY)
Admission: RE | Admit: 2014-05-15 | Discharge: 2014-05-15 | Disposition: A | Discharge status: Home / Self Care | Payer: PRIVATE HEALTH INSURANCE | Source: Ambulatory Visit | Attending: Nephrology | Admitting: Nephrology

## 2014-05-15 DIAGNOSIS — D631 Anemia in chronic kidney disease: Secondary | ICD-10-CM | POA: Diagnosis not present

## 2014-05-15 DIAGNOSIS — N184 Chronic kidney disease, stage 4 (severe): Secondary | ICD-10-CM | POA: Diagnosis not present

## 2014-05-15 LAB — RENAL FUNCTION PANEL
Albumin: 3 g/dL — ABNORMAL LOW (ref 3.5–5.2)
Anion gap: 24 — ABNORMAL HIGH (ref 5–15)
BUN: 129 mg/dL — ABNORMAL HIGH (ref 6–23)
CO2: 14 mEq/L — ABNORMAL LOW (ref 19–32)
Calcium: 10.3 mg/dL (ref 8.4–10.5)
Chloride: 101 mEq/L (ref 96–112)
Creatinine, Ser: 6.1 mg/dL — ABNORMAL HIGH (ref 0.50–1.10)
GFR calc non Af Amer: 6 mL/min — ABNORMAL LOW (ref >90)
GFR, EST AFRICAN AMERICAN: 7 mL/min — AB (ref >90)
GLUCOSE: 116 mg/dL — AB (ref 70–99)
PHOSPHORUS: 7.8 mg/dL — AB (ref 2.3–4.6)
Potassium: 3.6 mEq/L — ABNORMAL LOW (ref 3.7–5.3)
SODIUM: 139 meq/L (ref 137–147)

## 2014-05-15 LAB — POCT HEMOGLOBIN-HEMACUE: HEMOGLOBIN: 12.5 g/dL (ref 12.0–15.0)

## 2014-05-15 MED ORDER — CLONIDINE HCL 0.1 MG PO TABS: 0.1000 mg | ORAL_TABLET | ORAL | Status: DC | PRN

## 2014-05-15 MED ORDER — EPOETIN ALFA 20000 UNIT/ML IJ SOLN: 20000.0000 [IU] | INTRAMUSCULAR | Status: DC

## 2014-05-29 ENCOUNTER — Inpatient Hospital Stay (HOSPITAL_COMMUNITY): Admission: RE | Admit: 2014-05-29 | Payer: PRIVATE HEALTH INSURANCE | Source: Ambulatory Visit

## 2014-06-04 ENCOUNTER — Inpatient Hospital Stay (HOSPITAL_COMMUNITY)
Admission: EM | Admit: 2014-06-04 | Discharge: 2014-06-09 | DRG: 314 | Disposition: A | Discharge status: Home Health | Payer: PRIVATE HEALTH INSURANCE | Attending: Internal Medicine | Admitting: Internal Medicine

## 2014-06-04 ENCOUNTER — Encounter (HOSPITAL_COMMUNITY): Payer: Self-pay | Admitting: *Deleted

## 2014-06-04 ENCOUNTER — Emergency Department (HOSPITAL_COMMUNITY): Payer: PRIVATE HEALTH INSURANCE

## 2014-06-04 DIAGNOSIS — F0390 Unspecified dementia without behavioral disturbance: Secondary | ICD-10-CM | POA: Insufficient documentation

## 2014-06-04 DIAGNOSIS — D649 Anemia, unspecified: Secondary | ICD-10-CM

## 2014-06-04 DIAGNOSIS — F0391 Unspecified dementia with behavioral disturbance: Secondary | ICD-10-CM | POA: Diagnosis present

## 2014-06-04 DIAGNOSIS — Z79899 Other long term (current) drug therapy: Secondary | ICD-10-CM

## 2014-06-04 DIAGNOSIS — Z7982 Long term (current) use of aspirin: Secondary | ICD-10-CM

## 2014-06-04 DIAGNOSIS — Z992 Dependence on renal dialysis: Secondary | ICD-10-CM

## 2014-06-04 DIAGNOSIS — N2581 Secondary hyperparathyroidism of renal origin: Secondary | ICD-10-CM | POA: Diagnosis present

## 2014-06-04 DIAGNOSIS — B9620 Unspecified Escherichia coli [E. coli] as the cause of diseases classified elsewhere: Secondary | ICD-10-CM | POA: Diagnosis present

## 2014-06-04 DIAGNOSIS — F411 Generalized anxiety disorder: Secondary | ICD-10-CM

## 2014-06-04 DIAGNOSIS — E785 Hyperlipidemia, unspecified: Secondary | ICD-10-CM | POA: Diagnosis present

## 2014-06-04 DIAGNOSIS — R0602 Shortness of breath: Secondary | ICD-10-CM

## 2014-06-04 DIAGNOSIS — I82409 Acute embolism and thrombosis of unspecified deep veins of unspecified lower extremity: Secondary | ICD-10-CM | POA: Diagnosis present

## 2014-06-04 DIAGNOSIS — J45909 Unspecified asthma, uncomplicated: Secondary | ICD-10-CM | POA: Diagnosis present

## 2014-06-04 DIAGNOSIS — I959 Hypotension, unspecified: Secondary | ICD-10-CM

## 2014-06-04 DIAGNOSIS — Z23 Encounter for immunization: Secondary | ICD-10-CM

## 2014-06-04 DIAGNOSIS — R197 Diarrhea, unspecified: Secondary | ICD-10-CM | POA: Diagnosis present

## 2014-06-04 DIAGNOSIS — I82403 Acute embolism and thrombosis of unspecified deep veins of lower extremity, bilateral: Secondary | ICD-10-CM | POA: Diagnosis present

## 2014-06-04 DIAGNOSIS — I824Z2 Acute embolism and thrombosis of unspecified deep veins of left distal lower extremity: Secondary | ICD-10-CM | POA: Diagnosis present

## 2014-06-04 DIAGNOSIS — N19 Unspecified kidney failure: Secondary | ICD-10-CM

## 2014-06-04 DIAGNOSIS — Z7952 Long term (current) use of systemic steroids: Secondary | ICD-10-CM

## 2014-06-04 DIAGNOSIS — N390 Urinary tract infection, site not specified: Secondary | ICD-10-CM

## 2014-06-04 DIAGNOSIS — N186 End stage renal disease: Secondary | ICD-10-CM | POA: Diagnosis present

## 2014-06-04 DIAGNOSIS — D631 Anemia in chronic kidney disease: Secondary | ICD-10-CM | POA: Diagnosis present

## 2014-06-04 DIAGNOSIS — G9340 Encephalopathy, unspecified: Secondary | ICD-10-CM | POA: Diagnosis present

## 2014-06-04 DIAGNOSIS — I120 Hypertensive chronic kidney disease with stage 5 chronic kidney disease or end stage renal disease: Secondary | ICD-10-CM | POA: Diagnosis present

## 2014-06-04 DIAGNOSIS — Z79891 Long term (current) use of opiate analgesic: Secondary | ICD-10-CM

## 2014-06-04 DIAGNOSIS — M109 Gout, unspecified: Secondary | ICD-10-CM | POA: Diagnosis present

## 2014-06-04 DIAGNOSIS — R410 Disorientation, unspecified: Secondary | ICD-10-CM

## 2014-06-04 LAB — COMPREHENSIVE METABOLIC PANEL
ALK PHOS: 60 U/L (ref 39–117)
ALT: 9 U/L (ref 0–35)
AST: 17 U/L (ref 0–37)
Albumin: 2.2 g/dL — ABNORMAL LOW (ref 3.5–5.2)
Anion gap: 14 (ref 5–15)
BUN: 5 mg/dL — ABNORMAL LOW (ref 6–23)
CALCIUM: 8.7 mg/dL (ref 8.4–10.5)
CO2: 25 mEq/L (ref 19–32)
Chloride: 96 mEq/L (ref 96–112)
Creatinine, Ser: 1.66 mg/dL — ABNORMAL HIGH (ref 0.50–1.10)
GFR calc non Af Amer: 28 mL/min — ABNORMAL LOW (ref >90)
GFR, EST AFRICAN AMERICAN: 33 mL/min — AB (ref >90)
GLUCOSE: 84 mg/dL (ref 70–99)
Potassium: 4 mEq/L (ref 3.7–5.3)
SODIUM: 135 meq/L — AB (ref 137–147)
TOTAL PROTEIN: 6.2 g/dL (ref 6.0–8.3)
Total Bilirubin: 0.6 mg/dL (ref 0.3–1.2)

## 2014-06-04 LAB — I-STAT CHEM 8, ED
CALCIUM ION: 1.03 mmol/L — AB (ref 1.13–1.30)
Chloride: 95 mEq/L — ABNORMAL LOW (ref 96–112)
Creatinine, Ser: 1.7 mg/dL — ABNORMAL HIGH (ref 0.50–1.10)
GLUCOSE: 85 mg/dL (ref 70–99)
HCT: 32 % — ABNORMAL LOW (ref 36.0–46.0)
Hemoglobin: 10.9 g/dL — ABNORMAL LOW (ref 12.0–15.0)
POTASSIUM: 3.8 meq/L (ref 3.7–5.3)
Sodium: 133 mEq/L — ABNORMAL LOW (ref 137–147)
TCO2: 27 mmol/L (ref 0–100)

## 2014-06-04 LAB — CBC
HCT: 30.3 % — ABNORMAL LOW (ref 36.0–46.0)
HEMOGLOBIN: 9.4 g/dL — AB (ref 12.0–15.0)
MCH: 28.3 pg (ref 26.0–34.0)
MCHC: 31 g/dL (ref 30.0–36.0)
MCV: 91.3 fL (ref 78.0–100.0)
PLATELETS: 199 10*3/uL (ref 150–400)
RBC: 3.32 MIL/uL — AB (ref 3.87–5.11)
RDW: 15 % (ref 11.5–15.5)
WBC: 6.7 10*3/uL (ref 4.0–10.5)

## 2014-06-04 LAB — I-STAT TROPONIN, ED: Troponin i, poc: 0.01 ng/mL (ref 0.00–0.08)

## 2014-06-04 LAB — I-STAT CG4 LACTIC ACID, ED: Lactic Acid, Venous: 2.17 mmol/L (ref 0.5–2.2)

## 2014-06-04 MED ORDER — SODIUM CHLORIDE 0.9 % IV BOLUS (SEPSIS)
500.0000 mL | Freq: Once | INTRAVENOUS | Status: AC
  Administered 2014-06-04: 500 mL via INTRAVENOUS

## 2014-06-04 NOTE — ED Provider Notes (Signed)
CSN: [id number]     Arrival date & time 06/04/14  2237 History   First MD Initiated Contact with Patient 06/04/14 2254     Chief Complaint  Patient presents with  . Hypotension  . Shortness of Breath     (Consider location/radiation/quality/duration/timing/severity/associated sxs/prior Treatment) HPI Comments: The patient is a 80 year old female, she has a history of worsening renal failure requiring dialysis which she started over the last 2 weeks, she has had for sessions of dialysis. She presents with altered mental status and increase shortness of breath. The family states that she has been persistently short of breath since starting dialysis but in the evening hours after dialysis she becomes very confused, disoriented, cries out but they're unsure what she is asking for and when paramedics found the patient tonight she had a blood pressure of 60/40 and was acutely confused. She did have dialysis today. The symptoms are intermittent, they seem to be gradually worsening, they are not associated with fevers, vomiting or diarrhea area the patient still makes urine.  Patient is a 80 y.o. female presenting with shortness of breath. The history is provided by a relative and medical records.  Shortness of Breath   Past Medical History  Diagnosis Date  . Gout   . Renal insufficiency   . Hypertension   . Hyperlipemia    Past Surgical History  Procedure Laterality Date  . Tubal ligation    . Av fistula placement     Family History  Problem Relation Age of Onset  . Colon cancer Neg Hx    History  Substance Use Topics  . Smoking status: Never Smoker   . Smokeless tobacco: Never Used  . Alcohol Use: No   OB History    No data available     Review of Systems  Respiratory: Positive for shortness of breath.   All other systems reviewed and are negative.     Allergies  Nsaids  Home Medications   Prior to Admission medications   Medication Sig Start Date End Date Taking?  Authorizing Provider  cilostazol (PLETAL) 50 MG tablet Take 50 mg by mouth 2 (two) times daily.   Yes Historical Provider, MD  multivitamin (RENA-VIT) TABS tablet Take 1 tablet by mouth daily.   Yes Historical Provider, MD  nitroGLYCERIN (NITROSTAT) 0.4 MG SL tablet Place 0.4 mg under the tongue every 5 (five) minutes as needed for chest pain.   Yes Historical Provider, MD  albuterol (PROAIR HFA) 108 (90 BASE) MCG/ACT inhaler Inhale 1 puff into the lungs at bedtime.    Historical Provider, MD  amLODipine (NORVASC) 5 MG tablet Take 5 mg by mouth daily.    Historical Provider, MD  aspirin EC 81 MG tablet Take 81 mg by mouth daily.    Historical Provider, MD  furosemide (LASIX) 20 MG tablet Take 20 mg by mouth daily.     Historical Provider, MD  HYDROcodone-acetaminophen (NORCO/VICODIN) 5-325 MG per tablet Take 1-2 tablets by mouth every 4 (four) hours as needed for moderate pain or severe pain. Patient not taking: Reported on 06/04/2014 05/10/14   Olivia Mackie, MD  metoprolol succinate (TOPROL-XL) 25 MG 24 hr tablet Take 25 mg by mouth daily.    Historical Provider, MD  predniSONE (DELTASONE) 20 MG tablet Take 2 tablets (40 mg total) by mouth daily. Patient not taking: Reported on 06/04/2014 05/10/14   Olivia Mackie, MD   BP 91/49 mmHg  Pulse 106  Temp(Src) 97.7 F (36.5 C) (Oral)  Resp 22  Wt 125 lb (56.7 kg)  SpO2 100% Physical Exam  Constitutional: She appears well-developed and well-nourished. She appears distressed.  HENT:  Head: Normocephalic and atraumatic.  Mouth/Throat: Oropharynx is clear and moist. No oropharyngeal exudate.  Eyes: Conjunctivae and EOM are normal. Pupils are equal, round, and reactive to light. Right eye exhibits no discharge. Left eye exhibits no discharge. No scleral icterus.  Neck: Normal range of motion. Neck supple. No JVD present. No thyromegaly present.  Cardiovascular: Regular rhythm, normal heart sounds and intact distal pulses.  Exam reveals no gallop and  no friction rub.   No murmur heard. Tachycardic to 110  Pulmonary/Chest: Breath sounds normal. No respiratory distress. She has no wheezes. She has no rales.  Mild tachypnea, decreased lung sounds at the bases  Abdominal: Soft. Bowel sounds are normal. She exhibits no distension and no mass. There is no tenderness.  Musculoskeletal: Normal range of motion. She exhibits no edema or tenderness.  Lymphadenopathy:    She has no cervical adenopathy.  Neurological: She is alert. Coordination normal.  She follows occasional commands, she does not answer questions appropriately, she moves all 4 extremities to painful stimuli, she is able to grip to command but is unable to lift her legs to command. She denies back pain or leg pain  Skin: Skin is warm and dry. No rash noted. No erythema.  Psychiatric: She has a normal mood and affect. Her behavior is normal.  Nursing note and vitals reviewed.   ED Course  Procedures (including critical care time) Labs Review Labs Reviewed  CBC - Abnormal; Notable for the following:    RBC 3.32 (*)    Hemoglobin 9.4 (*)    HCT 30.3 (*)    All other components within normal limits  COMPREHENSIVE METABOLIC PANEL - Abnormal; Notable for the following:    Sodium 135 (*)    BUN 5 (*)    Creatinine, Ser 1.66 (*)    Albumin 2.2 (*)    GFR calc non Af Amer 28 (*)    GFR calc Af Amer 33 (*)    All other components within normal limits  URINALYSIS, ROUTINE W REFLEX MICROSCOPIC - Abnormal; Notable for the following:    APPearance CLOUDY (*)    Hgb urine dipstick SMALL (*)    Protein, ur 100 (*)    Leukocytes, UA LARGE (*)    All other components within normal limits  URINE MICROSCOPIC-ADD ON - Abnormal; Notable for the following:    Squamous Epithelial / LPF FEW (*)    Bacteria, UA FEW (*)    All other components within normal limits  I-STAT CHEM 8, ED - Abnormal; Notable for the following:    Sodium 133 (*)    Chloride 95 (*)    BUN <3 (*)    Creatinine,  Ser 1.70 (*)    Calcium, Ion 1.03 (*)    Hemoglobin 10.9 (*)    HCT 32.0 (*)    All other components within normal limits  CULTURE, BLOOD (ROUTINE X 2)  CULTURE, BLOOD (ROUTINE X 2)  URINE CULTURE  I-STAT CG4 LACTIC ACID, ED  Rosezena SensorI-STAT TROPOININ, ED    Imaging Review Dg Chest Port 1 View  06/04/2014   CLINICAL DATA:  Asthma and hypertension.  Shortness of breath  EXAM: PORTABLE CHEST - 1 VIEW  COMPARISON:  05/10/2014  FINDINGS: Normal heart size. No pleural effusion or edema. Lung volumes are low. No airspace consolidation. Coarsened interstitial markings are identified bilaterally.  IMPRESSION: 1. Low lung volumes. 2. No acute findings noted.   Electronically Signed   By: Signa Kellaylor  Stroud M.D.   On: 06/04/2014 23:45      MDM   Final diagnoses:  SOB (shortness of breath)  Hypotension, unspecified hypotension type  Anemia, unspecified anemia type  Renal failure  UTI (lower urinary tract infection)    The patient has vital signs which are persistently hypotensive. Initially she had a blood pressure of 60/40 at paramedic arrival, she is now 95 systolic. Her heart rate is 110, she has no peripheral edema or asymmetry, she doesn't have any significant abnormal lung sounds but given her symptoms are related to dialysis suspect that she is having postdialysis hypotensive episodes. We'll check other causes including EKG, labs to rule out anemia, urinary tract infection or pneumonia.  The patient has had persistent hypotension despite fluid resuscitation, she will receive antibiotics for urinary tract infection, I have discussed the patient's care with the hospitalist who will admit to the hospital. The patient will go to a stepdown bed due to persistent hypotension, critical care delivered for this patient likely has early sepsis. That being said I would consider that for dialysis is taking off too much fluid as the patient is still making urine and able to diuresis small amount.  CRITICAL  CARE Performed by: Vida RollerMILLER,Kayton Ripp D Total critical care time: 30 Critical care time was exclusive of separately billable procedures and treating other patients. Critical care was necessary to treat or prevent imminent or life-threatening deterioration. Critical care was time spent personally by me on the following activities: development of treatment plan with patient and/or surrogate as well as nursing, discussions with consultants, evaluation of patient's response to treatment, examination of patient, obtaining history from patient or surrogate, ordering and performing treatments and interventions, ordering and review of laboratory studies, ordering and review of radiographic studies, pulse oximetry and re-evaluation of patient's condition.  Meds given in ED:  Medications  cefTRIAXone (ROCEPHIN) 1 g in dextrose 5 % 50 mL IVPB (1 g Intravenous New Bag/Given 06/05/14 0101)  sodium chloride 0.9 % bolus 500 mL (0 mLs Intravenous Stopped 06/05/14 0101)      Vida RollerBrian D Scott Fix, MD 06/05/14 0107

## 2014-06-04 NOTE — ED Notes (Signed)
MD at bedside. 

## 2014-06-04 NOTE — ED Notes (Signed)
Pt arrives via [facility] from home c/o SOB every night x last week. Pt has been experiencing intermittent confusion at night for past month. Pt arrived home from dialysis and became SOB upon EMS arrival she was hypotensive. 60/40.

## 2014-06-05 ENCOUNTER — Encounter (HOSPITAL_COMMUNITY): Payer: Self-pay | Admitting: Internal Medicine

## 2014-06-05 ENCOUNTER — Inpatient Hospital Stay (HOSPITAL_COMMUNITY): Payer: PRIVATE HEALTH INSURANCE

## 2014-06-05 DIAGNOSIS — N390 Urinary tract infection, site not specified: Secondary | ICD-10-CM | POA: Diagnosis present

## 2014-06-05 DIAGNOSIS — Z79899 Other long term (current) drug therapy: Secondary | ICD-10-CM | POA: Diagnosis not present

## 2014-06-05 DIAGNOSIS — B9620 Unspecified Escherichia coli [E. coli] as the cause of diseases classified elsewhere: Secondary | ICD-10-CM | POA: Diagnosis present

## 2014-06-05 DIAGNOSIS — R197 Diarrhea, unspecified: Secondary | ICD-10-CM | POA: Diagnosis present

## 2014-06-05 DIAGNOSIS — N186 End stage renal disease: Secondary | ICD-10-CM | POA: Diagnosis present

## 2014-06-05 DIAGNOSIS — I82403 Acute embolism and thrombosis of unspecified deep veins of lower extremity, bilateral: Secondary | ICD-10-CM | POA: Diagnosis present

## 2014-06-05 DIAGNOSIS — I959 Hypotension, unspecified: Principal | ICD-10-CM

## 2014-06-05 DIAGNOSIS — N2581 Secondary hyperparathyroidism of renal origin: Secondary | ICD-10-CM | POA: Diagnosis present

## 2014-06-05 DIAGNOSIS — M109 Gout, unspecified: Secondary | ICD-10-CM | POA: Diagnosis present

## 2014-06-05 DIAGNOSIS — I824Z2 Acute embolism and thrombosis of unspecified deep veins of left distal lower extremity: Secondary | ICD-10-CM | POA: Diagnosis present

## 2014-06-05 DIAGNOSIS — Z7982 Long term (current) use of aspirin: Secondary | ICD-10-CM | POA: Diagnosis not present

## 2014-06-05 DIAGNOSIS — Z992 Dependence on renal dialysis: Secondary | ICD-10-CM | POA: Diagnosis not present

## 2014-06-05 DIAGNOSIS — Z23 Encounter for immunization: Secondary | ICD-10-CM | POA: Diagnosis not present

## 2014-06-05 DIAGNOSIS — I120 Hypertensive chronic kidney disease with stage 5 chronic kidney disease or end stage renal disease: Secondary | ICD-10-CM | POA: Diagnosis present

## 2014-06-05 DIAGNOSIS — R0602 Shortness of breath: Secondary | ICD-10-CM | POA: Diagnosis present

## 2014-06-05 DIAGNOSIS — E785 Hyperlipidemia, unspecified: Secondary | ICD-10-CM | POA: Diagnosis present

## 2014-06-05 DIAGNOSIS — I369 Nonrheumatic tricuspid valve disorder, unspecified: Secondary | ICD-10-CM

## 2014-06-05 DIAGNOSIS — F0391 Unspecified dementia with behavioral disturbance: Secondary | ICD-10-CM | POA: Diagnosis present

## 2014-06-05 DIAGNOSIS — F411 Generalized anxiety disorder: Secondary | ICD-10-CM | POA: Diagnosis present

## 2014-06-05 DIAGNOSIS — Z7952 Long term (current) use of systemic steroids: Secondary | ICD-10-CM | POA: Diagnosis not present

## 2014-06-05 DIAGNOSIS — J45909 Unspecified asthma, uncomplicated: Secondary | ICD-10-CM | POA: Diagnosis present

## 2014-06-05 DIAGNOSIS — Z79891 Long term (current) use of opiate analgesic: Secondary | ICD-10-CM | POA: Diagnosis not present

## 2014-06-05 DIAGNOSIS — D631 Anemia in chronic kidney disease: Secondary | ICD-10-CM | POA: Diagnosis present

## 2014-06-05 DIAGNOSIS — G9340 Encephalopathy, unspecified: Secondary | ICD-10-CM | POA: Diagnosis present

## 2014-06-05 LAB — COMPREHENSIVE METABOLIC PANEL
ALT: 8 U/L (ref 0–35)
AST: 16 U/L (ref 0–37)
Albumin: 2.1 g/dL — ABNORMAL LOW (ref 3.5–5.2)
Alkaline Phosphatase: 55 U/L (ref 39–117)
Anion gap: 10 (ref 5–15)
BUN: 7 mg/dL (ref 6–23)
CALCIUM: 8.6 mg/dL (ref 8.4–10.5)
CO2: 29 mEq/L (ref 19–32)
Chloride: 96 mEq/L (ref 96–112)
Creatinine, Ser: 1.87 mg/dL — ABNORMAL HIGH (ref 0.50–1.10)
GFR calc Af Amer: 28 mL/min — ABNORMAL LOW (ref >90)
GFR calc non Af Amer: 24 mL/min — ABNORMAL LOW (ref >90)
Glucose, Bld: 79 mg/dL (ref 70–99)
Potassium: 3.9 mEq/L (ref 3.7–5.3)
Sodium: 135 mEq/L — ABNORMAL LOW (ref 137–147)
TOTAL PROTEIN: 5.8 g/dL — AB (ref 6.0–8.3)
Total Bilirubin: 0.4 mg/dL (ref 0.3–1.2)

## 2014-06-05 LAB — CBC WITH DIFFERENTIAL/PLATELET
Basophils Absolute: 0 10*3/uL (ref 0.0–0.1)
Basophils Relative: 0 % (ref 0–1)
Eosinophils Absolute: 0 10*3/uL (ref 0.0–0.7)
Eosinophils Relative: 0 % (ref 0–5)
HCT: 30.3 % — ABNORMAL LOW (ref 36.0–46.0)
HEMOGLOBIN: 9.6 g/dL — AB (ref 12.0–15.0)
LYMPHS ABS: 1 10*3/uL (ref 0.7–4.0)
Lymphocytes Relative: 15 % (ref 12–46)
MCH: 29.8 pg (ref 26.0–34.0)
MCHC: 31.7 g/dL (ref 30.0–36.0)
MCV: 94.1 fL (ref 78.0–100.0)
MONOS PCT: 11 % (ref 3–12)
Monocytes Absolute: 0.7 10*3/uL (ref 0.1–1.0)
NEUTROS PCT: 74 % (ref 43–77)
Neutro Abs: 4.7 10*3/uL (ref 1.7–7.7)
Platelets: 180 10*3/uL (ref 150–400)
RBC: 3.22 MIL/uL — AB (ref 3.87–5.11)
RDW: 14.7 % (ref 11.5–15.5)
WBC: 6.4 10*3/uL (ref 4.0–10.5)

## 2014-06-05 LAB — URINALYSIS, ROUTINE W REFLEX MICROSCOPIC
BILIRUBIN URINE: NEGATIVE
GLUCOSE, UA: NEGATIVE mg/dL
Ketones, ur: NEGATIVE mg/dL
Nitrite: NEGATIVE
PH: 8 (ref 5.0–8.0)
Protein, ur: 100 mg/dL — AB
SPECIFIC GRAVITY, URINE: 1.007 (ref 1.005–1.030)
UROBILINOGEN UA: 1 mg/dL (ref 0.0–1.0)

## 2014-06-05 LAB — TSH: TSH: 1.35 u[IU]/mL (ref 0.350–4.500)

## 2014-06-05 LAB — PROCALCITONIN: Procalcitonin: 0.75 ng/mL

## 2014-06-05 LAB — TROPONIN I

## 2014-06-05 LAB — LACTIC ACID, PLASMA
LACTIC ACID, VENOUS: 1.8 mmol/L (ref 0.5–2.2)
LACTIC ACID, VENOUS: 1.2 mmol/L (ref 0.5–2.2)

## 2014-06-05 LAB — URINE MICROSCOPIC-ADD ON

## 2014-06-05 LAB — MRSA PCR SCREENING: MRSA BY PCR: NEGATIVE

## 2014-06-05 LAB — D-DIMER, QUANTITATIVE: D-Dimer, Quant: 7.97 ug/mL-FEU — ABNORMAL HIGH (ref 0.00–0.48)

## 2014-06-05 LAB — AMMONIA: AMMONIA: 13 umol/L (ref 11–60)

## 2014-06-05 LAB — CORTISOL: Cortisol, Plasma: 16.4 ug/dL

## 2014-06-05 MED ORDER — SODIUM CHLORIDE 0.9 % IV BOLUS (SEPSIS)
200.0000 mL | Freq: Once | INTRAVENOUS | Status: AC
  Administered 2014-06-05: 200 mL via INTRAVENOUS

## 2014-06-05 MED ORDER — CINACALCET HCL 30 MG PO TABS
30.0000 mg | ORAL_TABLET | Freq: Every day | ORAL | Status: DC
  Administered 2014-06-05 – 2014-06-09 (×4): 30 mg via ORAL
  Filled 2014-06-05 (×6): qty 1

## 2014-06-05 MED ORDER — ACETAMINOPHEN 325 MG PO TABS: 650.0000 mg | ORAL_TABLET | Freq: Four times a day (QID) | ORAL | Status: DC | PRN

## 2014-06-05 MED ORDER — CALCITRIOL 0.5 MCG PO CAPS
0.5000 ug | ORAL_CAPSULE | ORAL | Status: DC
  Filled 2014-06-05: qty 1

## 2014-06-05 MED ORDER — SEVELAMER CARBONATE 800 MG PO TABS
800.0000 mg | ORAL_TABLET | Freq: Three times a day (TID) | ORAL | Status: DC
  Administered 2014-06-05 – 2014-06-09 (×10): 800 mg via ORAL
  Filled 2014-06-05 (×15): qty 1

## 2014-06-05 MED ORDER — CETYLPYRIDINIUM CHLORIDE 0.05 % MT LIQD
7.0000 mL | Freq: Two times a day (BID) | OROMUCOSAL | Status: DC
Start: 2014-06-05 — End: 2014-06-09
  Administered 2014-06-05 – 2014-06-09 (×8): 7 mL via OROMUCOSAL

## 2014-06-05 MED ORDER — ONDANSETRON HCL 4 MG/2ML IJ SOLN: 4.0000 mg | Freq: Four times a day (QID) | INTRAMUSCULAR | Status: DC | PRN

## 2014-06-05 MED ORDER — MIDODRINE HCL 5 MG PO TABS
5.0000 mg | ORAL_TABLET | Freq: Three times a day (TID) | ORAL | Status: DC
  Administered 2014-06-05 – 2014-06-06 (×4): 5 mg via ORAL
  Filled 2014-06-05 (×7): qty 1

## 2014-06-05 MED ORDER — SODIUM CHLORIDE 0.9 % IV BOLUS (SEPSIS)
250.0000 mL | Freq: Once | INTRAVENOUS | Status: AC
  Administered 2014-06-05: 250 mL via INTRAVENOUS

## 2014-06-05 MED ORDER — RENA-VITE PO TABS
1.0000 | ORAL_TABLET | Freq: Every day | ORAL | Status: DC
  Administered 2014-06-05 – 2014-06-08 (×4): 1 via ORAL
  Filled 2014-06-05 (×5): qty 1

## 2014-06-05 MED ORDER — ALBUTEROL SULFATE (2.5 MG/3ML) 0.083% IN NEBU
3.0000 mL | INHALATION_SOLUTION | Freq: Every day | RESPIRATORY_TRACT | Status: DC
  Administered 2014-06-05 – 2014-06-08 (×4): 3 mL via RESPIRATORY_TRACT
  Filled 2014-06-05 (×4): qty 3

## 2014-06-05 MED ORDER — ACETAMINOPHEN 650 MG RE SUPP: 650.0000 mg | Freq: Four times a day (QID) | RECTAL | Status: DC | PRN

## 2014-06-05 MED ORDER — PNEUMOCOCCAL VAC POLYVALENT 25 MCG/0.5ML IJ INJ
0.5000 mL | INJECTION | INTRAMUSCULAR | Status: DC
  Filled 2014-06-05: qty 0.5

## 2014-06-05 MED ORDER — RENA-VITE PO TABS: 1.0000 | ORAL_TABLET | Freq: Every day | ORAL | Status: DC

## 2014-06-05 MED ORDER — NITROGLYCERIN 0.4 MG SL SUBL: 0.4000 mg | SUBLINGUAL_TABLET | SUBLINGUAL | Status: DC | PRN

## 2014-06-05 MED ORDER — DEXTROSE 5 % IV SOLN
1.0000 g | Freq: Once | INTRAVENOUS | Status: AC
  Administered 2014-06-05: 1 g via INTRAVENOUS
  Filled 2014-06-05: qty 10

## 2014-06-05 MED ORDER — SODIUM CHLORIDE 0.9 % IV SOLN
125.0000 mg | INTRAVENOUS | Status: DC
  Administered 2014-06-07: 125 mg via INTRAVENOUS
  Filled 2014-06-05 (×2): qty 10

## 2014-06-05 MED ORDER — CILOSTAZOL 50 MG PO TABS
50.0000 mg | ORAL_TABLET | Freq: Two times a day (BID) | ORAL | Status: DC
  Administered 2014-06-05: 50 mg via ORAL
  Filled 2014-06-05 (×2): qty 1

## 2014-06-05 MED ORDER — ENOXAPARIN SODIUM 30 MG/0.3ML ~~LOC~~ SOLN
30.0000 mg | SUBCUTANEOUS | Status: DC
  Administered 2014-06-05 – 2014-06-06 (×2): 30 mg via SUBCUTANEOUS
  Filled 2014-06-05 (×2): qty 0.3

## 2014-06-05 MED ORDER — DARBEPOETIN ALFA 40 MCG/0.4ML IJ SOSY
40.0000 ug | PREFILLED_SYRINGE | INTRAMUSCULAR | Status: DC
  Administered 2014-06-07: 40 ug via INTRAVENOUS
  Filled 2014-06-05: qty 0.4

## 2014-06-05 MED ORDER — HYDROCODONE-ACETAMINOPHEN 5-325 MG PO TABS: 1.0000 | ORAL_TABLET | ORAL | Status: DC | PRN

## 2014-06-05 MED ORDER — CEFTRIAXONE SODIUM IN DEXTROSE 20 MG/ML IV SOLN
1.0000 g | INTRAVENOUS | Status: DC
  Administered 2014-06-05 – 2014-06-07 (×3): 1 g via INTRAVENOUS
  Filled 2014-06-05 (×5): qty 50

## 2014-06-05 MED ORDER — ASPIRIN EC 81 MG PO TBEC
81.0000 mg | DELAYED_RELEASE_TABLET | Freq: Every day | ORAL | Status: DC
  Administered 2014-06-05 – 2014-06-06 (×2): 81 mg via ORAL
  Filled 2014-06-05 (×2): qty 1

## 2014-06-05 MED ORDER — ONDANSETRON HCL 4 MG PO TABS: 4.0000 mg | ORAL_TABLET | Freq: Four times a day (QID) | ORAL | Status: DC | PRN

## 2014-06-05 MED ORDER — SODIUM CHLORIDE 0.9 % IJ SOLN
3.0000 mL | Freq: Two times a day (BID) | INTRAMUSCULAR | Status: DC
  Administered 2014-06-05 – 2014-06-08 (×5): 3 mL via INTRAVENOUS

## 2014-06-05 NOTE — Consult Note (Addendum)
HPI: [id number] F with hx of ESRD, [facility]. Pt unable to provide much meaningful history due to confusion. PCCM asked to evaluate for hypotension. There has been no documented fever. She presently denies dyspnea and all specific symptoms. She is oriented to person but not date, place or circumstance  PMH: Past Medical History  Diagnosis Date  . Gout   . Renal insufficiency   . Hypertension   . Hyperlipemia     No current facility-administered medications on file prior to encounter.   Current Outpatient Prescriptions on File Prior to Encounter  Medication Sig Dispense Refill  . cilostazol (PLETAL) 50 MG tablet Take 50 mg by mouth 2 (two) times daily.    . nitroGLYCERIN (NITROSTAT) 0.4 MG SL tablet Place 0.4 mg under the tongue every 5 (five) minutes as needed for chest pain.    Marland Kitchen. albuterol (PROAIR HFA) 108 (90 BASE) MCG/ACT inhaler Inhale 1 puff into the lungs at bedtime.    Marland Kitchen. amLODipine (NORVASC) 5 MG tablet Take 5 mg by mouth daily.    Marland Kitchen. aspirin EC 81 MG tablet Take 81 mg by mouth daily.    . furosemide (LASIX) 20 MG tablet Take 20 mg by mouth daily.     Marland Kitchen. HYDROcodone-acetaminophen (NORCO/VICODIN) 5-325 MG per tablet Take 1-2 tablets by mouth every 4 (four) hours as needed for moderate pain or severe pain. (Patient not taking: Reported on 06/04/2014) 30 tablet 0  . metoprolol succinate (TOPROL-XL) 25 MG 24 hr tablet Take 25 mg by mouth daily.    . predniSONE (DELTASONE) 20 MG tablet Take 2 tablets (40 mg total) by mouth daily. (Patient not taking: Reported on 06/04/2014) 10 tablet 0   History   Social History  . Marital Status: Divorced    Spouse Name: N/A    Number of Children: N/A  . Years of Education: N/A   Occupational History  . Not on file.   Social History Main Topics  . Smoking status: Never Smoker   . Smokeless tobacco: Never Used  . Alcohol Use: No  . Drug Use: No  . Sexual Activity: Not on file   Other Topics Concern  . Not on  file   Social History Narrative   Family History  Problem Relation Age of Onset  . Colon cancer Neg Hx   . Diabetes Mellitus II Neg Hx     Physical Exam  Constitutional: No distress.  HENT:  Head: Normocephalic and atraumatic.  Eyes: Conjunctivae and EOM are normal. Pupils are equal, round, and reactive to light.  Neck: Normal range of motion. Neck supple. No JVD present. No thyromegaly present.  Cardiovascular: Normal rate, regular rhythm and normal heart sounds.   No murmur heard. Pulmonary/Chest: Effort normal and breath sounds normal. No respiratory distress.  Abdominal: Soft. Bowel sounds are normal. She exhibits no distension. There is no tenderness. There is no guarding.  Musculoskeletal: Normal range of motion. She exhibits no edema or tenderness.  Lymphadenopathy:    She has no cervical adenopathy.  Neurological: She is alert. She has normal reflexes. No cranial nerve deficit.  Poorly oriented  Skin: Skin is warm and dry. No rash noted. No erythema.  Psychiatric: She has a normal mood and affect.    I have reviewed all of today's lab results. Relevant abnormalities are discussed in the A/P section  CXR: NACPD  IMPRESSION/PLAN: Asymptomatic hypotension  Amlodipine, metoprolol PTA - note long T1/2 of amlodipine  Also on Pletal which  has side effect of hypotension listed  Recent course of prednisone but does not appear to be on chronically  Might be relatively hypovolemic (No findings on exam or CXR to suspect volume overload)  Dyspnea - appears resolved as she is very comfortable on RA. CXR entirely normal. Doubt PE  Confusion of one month's duration - appears to have dementia  Pyuria - doubt this is septic shock   REC: Check serum cortisol   If low, consider formal testing for adrenal insuff or empiric hydrocortisone Check procalcitonin DC Pletal  Cont holding anti-hypertensives Agree with Echocardiogram V/Q scan and LE duplex scanning ordered by primary  team Antibiotics per primary team  Billy Fischeravid Neizan Debruhl, MD ; [facility] service Mobile [phone].  After 5:30 PM or weekends, call [phone]

## 2014-06-05 NOTE — Progress Notes (Signed)
PATIENT DETAILS Name: [name] Age: 80 y.o. Sex: female Date of Birth: 23-Apr-1933 Admit Date: 06/04/2014 Admitting Physician Eduard Clos, MD ZOX:[id number],UJWJX A, MD  Subjective: Confused. Answers only a few questions appropriately  Assessment/Plan: Principal Problem:  Hypotension:suspect multifactorial-recently started on Dialysis-not sure what her dry weight is supposed to be, does have a UTI that could be contributing. Lactate within normal limits. Received 500 cc bolus on admit, BP still low-apart from encephalopathy-no other symptoms. Will start Midodrine, continue to hold all anti-hypertensive medications. If continues to be hypotensive-will bolus 200 cc. May need to transient pressor support if this continues. Will check Echo-but doubt cardiogenic etiology.  Active Problems:  Reported hx of Shortness of breath:not hypoxic on my exam this am-O2 saturation in the mid 90's on room air. CXR neg for infiltrates. Confusion makes hx of SOB unreliable. Doubt PE-although D-Dimer significantly elevated. Will check Lower ext Doppler, VQ Scan ordered by admitting MD.  BJY:NWGNFAOZ Rocephin. Await Cultures. Apart from encephalopathy-no other symptoms. Afebrile and without any leukocytosis.  Encephalopathy:?hx of mild dementia/cognitive dysfunction. CT head negative for acute abnormalities.Could be from UTI, given just started on Dialysis-?Dialysis Dysequilibrium Syndrome. Await Nephrology eval, continue IV Abx, supportive care  Reported hx of Diarrhea:none witnessed this am. Await Stool C Diff PCR.  Anemia:secondary to CKD. Monitor periodically  HYQ:MVHQI hypotension-all antihypertensives on hold  Asthma:lungs clear on exam.   Disposition: Remain inpatient  Antibiotics:  See below  Anti-infectives    Start     Dose/Rate Route Frequency Ordered Stop   06/05/14 2200  cefTRIAXone (ROCEPHIN) 1 g in dextrose 5 % 50 mL IVPB - Premix     1 g100 mL/hr over 30  Minutes Intravenous Every 24 hours 06/05/14 0225     06/05/14 0100  cefTRIAXone (ROCEPHIN) 1 g in dextrose 5 % 50 mL IVPB     1 g100 mL/hr over 30 Minutes Intravenous  Once 06/05/14 0053 06/05/14 0131      DVT Prophylaxis: Prophylactic Lovenox  Code Status: Full code  Family Communication None at bedside, will await arrival of family members  Procedures:  None  CONSULTS:  nephrology  Time spent 40 minutes-which includes 50% of the time with face-to-face with patient/ family and coordinating care related to the above assessment and plan.  MEDICATIONS: Scheduled Meds: . albuterol  3 mL Inhalation QHS  . antiseptic oral rinse  7 mL Mouth Rinse BID  . aspirin EC  81 mg Oral Daily  . cefTRIAXone (ROCEPHIN)  IV  1 g Intravenous Q24H  . cilostazol  50 mg Oral BID  . enoxaparin (LOVENOX) injection  30 mg Subcutaneous Q24H  . midodrine  5 mg Oral TID WC  . multivitamin  1 tablet Oral QHS  . sodium chloride  200 mL Intravenous Once  . sodium chloride  3 mL Intravenous Q12H   Continuous Infusions:  PRN Meds:.acetaminophen **OR** acetaminophen, HYDROcodone-acetaminophen, nitroGLYCERIN, ondansetron **OR** ondansetron (ZOFRAN) IV    PHYSICAL EXAM: Vital signs in last 24 hours: Filed Vitals:   06/05/14 0615 06/05/14 0630 06/05/14 0645 06/05/14 0700  BP: 83/58 75/35 73/33  74/35  Pulse: 100  91 72  Temp:    98.3 F (36.8 C)  TempSrc:    Oral  Resp: 23 15 13 18   Height:      Weight:      SpO2: 99%  100% 100%    Weight change:  Filed Weights   06/04/14 2245 06/05/14 0217  Weight: 56.7  kg (125 lb) 55.6 kg (122 lb 9.2 oz)   Body mass index is 23.94 kg/(m^2).   Gen Exam: Awake, confused, with clear speech.   Neck: Supple, No JVD.   Chest: B/L Clear.   CVS: S1 S2 Regular, no murmurs.  Abdomen: soft, BS +, non tender, non distended.  Extremities: no edema, lower extremities warm to touch. Neurologic: Non Focal.   Skin: No Rash.   Wounds: N/A.    Intake/Output from  previous day:  Intake/Output Summary (Last 24 hours) at 06/05/14 0919 Last data filed at 06/05/14 0230  Gross per 24 hour  Intake    553 ml  Output      0 ml  Net    553 ml     LAB RESULTS: CBC  Recent Labs Lab 06/04/14 2207 06/04/14 2313 06/05/14 0317  WBC 6.7  --  6.4  HGB 9.4* 10.9* 9.6*  HCT 30.3* 32.0* 30.3*  PLT 199  --  180  MCV 91.3  --  94.1  MCH 28.3  --  29.8  MCHC 31.0  --  31.7  RDW 15.0  --  14.7  LYMPHSABS  --   --  1.0  MONOABS  --   --  0.7  EOSABS  --   --  0.0  BASOSABS  --   --  0.0    Chemistries   Recent Labs Lab 06/04/14 2207 06/04/14 2313 06/05/14 0317  NA 135* 133* 135*  K 4.0 3.8 3.9  CL 96 95* 96  CO2 25  --  29  GLUCOSE 84 85 79  BUN 5* <3* 7  CREATININE 1.66* 1.70* 1.87*  CALCIUM 8.7  --  8.6    CBG: No results for input(s): GLUCAP in the last 168 hours.  GFR Estimated Creatinine Clearance: 19.1 mL/min (by C-G formula based on Cr of 1.87).  Coagulation profile No results for input(s): INR, PROTIME in the last 168 hours.  Cardiac Enzymes  Recent Labs Lab 06/05/14 0317  TROPONINI <0.30    Invalid input(s): POCBNP  Recent Labs  06/05/14 0317  DDIMER 7.97*   No results for input(s): HGBA1C in the last 72 hours. No results for input(s): CHOL, HDL, LDLCALC, TRIG, CHOLHDL, LDLDIRECT in the last 72 hours. No results for input(s): TSH, T4TOTAL, T3FREE, THYROIDAB in the last 72 hours.  Invalid input(s): FREET3 No results for input(s): VITAMINB12, FOLATE, FERRITIN, TIBC, IRON, RETICCTPCT in the last 72 hours. No results for input(s): LIPASE, AMYLASE in the last 72 hours.  Urine Studies No results for input(s): UHGB, CRYS in the last 72 hours.  Invalid input(s): UACOL, UAPR, USPG, UPH, UTP, UGL, UKET, UBIL, UNIT, UROB, ULEU, UEPI, UWBC, URBC, UBAC, CAST, UCOM, BILUA  MICROBIOLOGY: Recent Results (from the past 240 hour(s))  MRSA PCR Screening     Status: None   Collection Time: 06/05/14  2:09 AM  Result Value  Ref Range Status   MRSA by PCR NEGATIVE NEGATIVE Final    Comment:        The GeneXpert MRSA Assay (FDA approved for NASAL specimens only), is one component of a comprehensive MRSA colonization surveillance program. It is not intended to diagnose MRSA infection nor to guide or monitor treatment for MRSA infections.     RADIOLOGY STUDIES/RESULTS: Dg Chest 2 View  05/10/2014   CLINICAL DATA:  Lower extremity swelling for several days, joint pain for 2 days. Chronic shortness of breath. Renal insufficiency.  EXAM: CHEST  2 VIEW  COMPARISON:  Chest radiograph  February 26, 2008  FINDINGS: The cardiac silhouette appears moderately enlarged, unchanged. Tortuous calcified aorta. Mild chronic interstitial changes without pleural effusions or focal consolidation. No pneumothorax. Patient is osteopenic.  IMPRESSION: Stable cardiomegaly, no acute pulmonary process.   Electronically Signed   By: Awilda Metroourtnay  Bloomer   On: 05/10/2014 01:41   Ct Head Wo Contrast  06/05/2014   CLINICAL DATA:  Intermittent confusion associated with hypertension.  EXAM: CT HEAD WITHOUT CONTRAST  TECHNIQUE: Contiguous axial images were obtained from the base of the skull through the vertex without intravenous contrast.  COMPARISON:  None.  FINDINGS: Skull and Sinuses:Negative for fracture or destructive process. The mastoids, middle ears, and imaged paranasal sinuses are clear.  Orbits: No acute abnormality.  Brain: No evidence of acute abnormality, such as acute infarction, hemorrhage, hydrocephalus, or mass lesion/mass effect. There is generalized volume loss consistent with atrophy. Mild chronic small vessel disease with ischemic gliosis noted around the frontal horn of the left lateral ventricle.  IMPRESSION: 1. No acute intracranial disease. 2. Brain atrophy and mild chronic small vessel ischemia.   Electronically Signed   By: Tiburcio PeaJonathan  Watts M.D.   On: 06/05/2014 03:17   Dg Chest Port 1 View  06/04/2014   CLINICAL DATA:   Asthma and hypertension.  Shortness of breath  EXAM: PORTABLE CHEST - 1 VIEW  COMPARISON:  05/10/2014  FINDINGS: Normal heart size. No pleural effusion or edema. Lung volumes are low. No airspace consolidation. Coarsened interstitial markings are identified bilaterally.  IMPRESSION: 1. Low lung volumes. 2. No acute findings noted.   Electronically Signed   By: Signa Kellaylor  Stroud M.D.   On: 06/04/2014 23:45    Jeoffrey MassedGHIMIRE,SHANKER, MD  [facility] Pager:[phone]  If 7PM-7AM, please contact night-coverage [email or web address] Password [email or web address] 06/05/2014, 9:19 AM   LOS: 1 day

## 2014-06-05 NOTE — Plan of Care (Signed)
Problem: Problem: Skin/Wound Progression Goal: HEALING SURGICAL WOUND Outcome: Not Applicable Date Met:  11/94/17 Goal: VAC APPROVAL Outcome: Not Applicable Date Met:  40/81/44 Goal: TRANSFER FOR RECONSTRUCTION/TX AT OTHER FACILITY Outcome: Not Applicable Date Met:  81/85/63 Goal: READY FOR RECONSTRUCTION AT [facility] Outcome: Not Applicable Date Met:  14/97/02 Goal: DECREASED LOS R/T RECONSTRUCTION Outcome: Not Applicable Date Met:  63/78/58

## 2014-06-05 NOTE — Progress Notes (Addendum)
Patient's blood pressure 74/36, paged MD Toniann FailKakrakandy.  No new orders at this time.   Ivery QualeJackson, Joanette Silveria A, RN

## 2014-06-05 NOTE — Progress Notes (Signed)
Pt hyptotensive. BP ranging from 60's-80's. MD made aware. 2 Boluses given for this shift totaling 450cc. Pt current BP at this time is 93/24(48). Patient has been asymptomatic with these hypotensive episodes and states she "feels just fine". Will continue to monitor BP closely and monitor for symptoms. Rise PaganiniURRY, Helayna Dun R, RN

## 2014-06-05 NOTE — Consult Note (Signed)
Indication for Consultation:  Management of ESRD/hemodialysis; anemia, hypertension/volume and secondary hyperparathyroidism  HPI: [name] is a 80 y.o. female who presented to the ED yesterday via EMS with complaints of SOB. She receives HD TTS @ [facility], recently started 11/17, has had 6 outpt treatments, history of HTN and gout. She has been hypotensive outpt, at her first HD she was 108/62 pre HD . She was told to stop her BP meds but per records it appears she continued to take. She is confused and unable to give any history, she reports feeling well, has no complaints. UA was positive for UTI. Primary team is working up hypotension, she has received NS bolus, ECHO pending. Troponin is negative, Ddimer elevated. Will arrange HD on TTS schedule while in the hospital.   Past Medical History  Diagnosis Date  . Gout   . Renal insufficiency   . Hypertension   . Hyperlipemia    Past Surgical History  Procedure Laterality Date  . Tubal ligation    . Av fistula placement     Family History  Problem Relation Age of Onset  . Colon cancer Neg Hx   . Diabetes Mellitus II Neg Hx    Social History:  reports that she has never smoked. She has never used smokeless tobacco. She reports that she does not drink alcohol or use illicit drugs. Allergies  Allergen Reactions  . Nsaids     REACTION: Avoid NSAIDS(renal failure)   Prior to Admission medications   Medication Sig Start Date End Date Taking? Authorizing Provider  cilostazol (PLETAL) 50 MG tablet Take 50 mg by mouth 2 (two) times daily.   Yes Historical Provider, MD  multivitamin (RENA-VIT) TABS tablet Take 1 tablet by mouth daily.   Yes Historical Provider, MD  nitroGLYCERIN (NITROSTAT) 0.4 MG SL tablet Place 0.4 mg under the tongue every 5 (five) minutes as needed for chest pain.   Yes Historical Provider, MD  albuterol (PROAIR HFA) 108 (90 BASE) MCG/ACT inhaler Inhale 1 puff into the lungs at bedtime.    Historical Provider, MD   amLODipine (NORVASC) 5 MG tablet Take 5 mg by mouth daily.    Historical Provider, MD  aspirin EC 81 MG tablet Take 81 mg by mouth daily.    Historical Provider, MD  furosemide (LASIX) 20 MG tablet Take 20 mg by mouth daily.     Historical Provider, MD  HYDROcodone-acetaminophen (NORCO/VICODIN) 5-325 MG per tablet Take 1-2 tablets by mouth every 4 (four) hours as needed for moderate pain or severe pain. Patient not taking: Reported on 06/04/2014 05/10/14   Olivia Mackie, MD  metoprolol succinate (TOPROL-XL) 25 MG 24 hr tablet Take 25 mg by mouth daily.    Historical Provider, MD  predniSONE (DELTASONE) 20 MG tablet Take 2 tablets (40 mg total) by mouth daily. Patient not taking: Reported on 06/04/2014 05/10/14   Olivia Mackie, MD   Current Facility-Administered Medications  Medication Dose Route Frequency Provider Last Rate Last Dose  . acetaminophen (TYLENOL) tablet 650 mg  650 mg Oral Q6H PRN Eduard Clos, MD       Or  . acetaminophen (TYLENOL) suppository 650 mg  650 mg Rectal Q6H PRN Eduard Clos, MD      . albuterol (PROVENTIL) (2.5 MG/3ML) 0.083% nebulizer solution 3 mL  3 mL Inhalation QHS Eduard Clos, MD      . antiseptic oral rinse (CPC / CETYLPYRIDINIUM CHLORIDE 0.05%) solution 7 mL  7 mL Mouth Rinse  BID Eduard Clos, MD   7 mL at 06/05/14 1000  . aspirin EC tablet 81 mg  81 mg Oral Daily Eduard Clos, MD   81 mg at 06/05/14 0850  . cefTRIAXone (ROCEPHIN) 1 g in dextrose 5 % 50 mL IVPB - Premix  1 g Intravenous Q24H Eduard Clos, MD      . cilostazol (PLETAL) tablet 50 mg  50 mg Oral BID Eduard Clos, MD   50 mg at 06/05/14 0850  . enoxaparin (LOVENOX) injection 30 mg  30 mg Subcutaneous Q24H Eduard Clos, MD   30 mg at 06/05/14 0851  . HYDROcodone-acetaminophen (NORCO/VICODIN) 5-325 MG per tablet 1-2 tablet  1-2 tablet Oral Q4H PRN Eduard Clos, MD      . midodrine (PROAMATINE) tablet 5 mg  5 mg Oral TID WC Shanker Levora Dredge, MD   5 mg at 06/05/14 1117  . multivitamin (RENA-VIT) tablet 1 tablet  1 tablet Oral QHS Eduard Clos, MD      . ondansetron [facility]) tablet 4 mg  4 mg Oral Q6H PRN Eduard Clos, MD       Or  . ondansetron [facility]) injection 4 mg  4 mg Intravenous Q6H PRN Eduard Clos, MD      . Melene Muller ON 06/06/2014] pneumococcal 23 valent vaccine (PNU-IMMUNE) injection 0.5 mL  0.5 mL Intramuscular Tomorrow-1000 Shanker Levora Dredge, MD      . sodium chloride 0.9 % injection 3 mL  3 mL Intravenous Q12H Eduard Clos, MD   3 mL at 06/05/14 9604    Review of Systems: Unable to obtain d/t confusion/ Pt has no complaints. Denies sob  Physical Exam: Filed Vitals:   06/05/14 0900 06/05/14 0956 06/05/14 1000 06/05/14 1131  BP: 67/32 80/43 99/47  82/37  Pulse: 101 90 98 80  Temp:    98.2 F (36.8 C)  TempSrc:    Oral  Resp: 18 14 18 16   Height:      Weight:      SpO2: 100% 100% 100% 100%     General: Thin, in no acute distress. Confused and Pleasant  Head: Normocephalic, atraumatic, sclera non-icteric, mucus membranes are moist Neck: Supple. JVD not elevated.  Lungs: Clear bilaterally to auscultation without wheezes, rales, or rhonchi. Breathing is unlabored. Heart: irregular Abdomen: Soft, non-tender, non-distended with normoactive bowel sounds. No rebound/guarding. No obvious abdominal masses. Healed old midline incision M-S:  Strength and tone appear normal for age. Lower extremities:without edema or ischemic changes, no open wounds  Neuro: Alert and oriented to person only. Moves all extremities spontaneously. Psych:  Does not answer questions appropriately  Dialysis Access: L AVF +b/t bruised, s/p infiltrate 11/17   Labs: Basic Metabolic Panel:  Recent Labs Lab 06/04/14 2207 06/04/14 2313 06/05/14 0317  NA 135* 133* 135*  K 4.0 3.8 3.9  CL 96 95* 96  CO2 25  --  29  GLUCOSE 84 85 79  BUN 5* <3* 7  CREATININE 1.66* 1.70* 1.87*  CALCIUM 8.7  --  8.6    Liver Function Tests:  Recent Labs Lab 06/04/14 2207 06/05/14 0317  AST 17 16  ALT 9 8  ALKPHOS 60 55  BILITOT 0.6 0.4  PROT 6.2 5.8*  ALBUMIN 2.2* 2.1*   No results for input(s): LIPASE, AMYLASE in the last 168 hours.  Recent Labs Lab 06/05/14 0317  AMMONIA 13   CBC:  Recent Labs Lab 06/04/14 2207 06/04/14 2313 06/05/14 5409  WBC 6.7  --  6.4  NEUTROABS  --   --  4.7  HGB 9.4* 10.9* 9.6*  HCT 30.3* 32.0* 30.3*  MCV 91.3  --  94.1  PLT 199  --  180   Cardiac Enzymes:  Recent Labs Lab 06/05/14 0317  TROPONINI <0.30    Studies/Results: Ct Head Wo Contrast  06/05/2014   CLINICAL DATA:  Intermittent confusion associated with hypertension.  EXAM: CT HEAD WITHOUT CONTRAST  TECHNIQUE: Contiguous axial images were obtained from the base of the skull through the vertex without intravenous contrast.  COMPARISON:  None.  FINDINGS: Skull and Sinuses:Negative for fracture or destructive process. The mastoids, middle ears, and imaged paranasal sinuses are clear.  Orbits: No acute abnormality.  Brain: No evidence of acute abnormality, such as acute infarction, hemorrhage, hydrocephalus, or mass lesion/mass effect. There is generalized volume loss consistent with atrophy. Mild chronic small vessel disease with ischemic gliosis noted around the frontal horn of the left lateral ventricle.  IMPRESSION: 1. No acute intracranial disease. 2. Brain atrophy and mild chronic small vessel ischemia.   Electronically Signed   By: Tiburcio PeaJonathan  Watts M.D.   On: 06/05/2014 03:17   Dg Chest Port 1 View  06/04/2014   CLINICAL DATA:  Asthma and hypertension.  Shortness of breath  EXAM: PORTABLE CHEST - 1 VIEW  COMPARISON:  05/10/2014  FINDINGS: Normal heart size. No pleural effusion or edema. Lung volumes are low. No airspace consolidation. Coarsened interstitial markings are identified bilaterally.  IMPRESSION: 1. Low lung volumes. 2. No acute findings noted.   Electronically Signed   By: Signa Kellaylor   Stroud M.D.   On: 06/04/2014 23:45   Dialysis Orders:  TTS [facility] 4hr  56kgs   4K/2.25Ca    5700u Heparin  L AVF   400/800    Calcitriol 0.145mcg      No Aranesp    Venofer 100mg s x6 stop 12/10    Assessment/Plan: 1.  hypotension-  Per outpt HD records has been hypotensive since starting HD. Continued to take BP meds so midodrine was not started outpt but started yesterday. ECHO pending. TSH pending Cortisol pending 2. UTI- rocephin per primary. Culture pending. afebrile 3. diarrhea- cdiff pending 4. SOB- d dimer elevated- LE dopplers pending. Chest xray clear. sats 100% on RA 5. AMS- per outpt HD notes pt has been confused. Head CT with no acute findings 6.  ESRD -  TTS [facility]. Next HD sat. K+3.9 On 4K+bath.  7.  Hypertension/volume  - no volume excess, edw just increased 1 kg yesterday 8.  Anemia  - hgb 9.6, no oupt Aranesp, start low dose on saturday. Fe bolus started for tsat 19 (11/19)- watch CBC 9.  Metabolic bone disease -  Ca+8.6/10.1 corrected. Phos 7/pth 839 11/19- cont renvela, sensipar and calcitriol  10.  Nutrition - Alb 2.1 renal diet. renal vit  Jetty DuhamelBridget Whelan, NP [facility] Alvino ChapelBeeper [phone] 06/05/2014, 12:13 PM   I have seen and examined this patient and agree with plan and assessment in the above note. . 80 yo AAF with ESRD. New to dialysis (about 6 treatments).  Came into dialysis her very first treatment with low BP (90-100) and has been low (as low as 70's-80's pre-dialysis) since starting, independent of any volume changes. According to records from her outpt dialysis center she was advised to stop BP meds but continued to take.  BP meds currently not being given, primary team has appropriately given her some fluid boluses (flat neck veins, no  edema) and started midodrine..  Workup should include ECHO, TSH, cortisol, ACTH stim test if cortisol low, holding BP meds, Agree with addition of midodrine.    As for her MS, interview seemed more suggestive of dementia  than a delerium. No family in when I saw patient - will try to get more history from them as to her baseline.This is not dialysis disequilibrium as initiation of dialysis is done via short initial treatments that are slowly increased in terms of time/BFR to avoid that problem.  Her initial treatment times were appropriately reduced.  Will continue to provide HD on her usual TTS schedule.    Her AVF appears to have been infiltrated in upper aspect and has a fairly substantial hematoma and bruit somewhat diminutive in area of hematoma.  May require a fistulagram, particulary if cannulation issues.  Camille Balynthia Xzavion Doswell, MD [facility] [phone] Pager 06/05/2014, 7:27 PM

## 2014-06-05 NOTE — H&P (Signed)
[facility] History and Physical  HARSHITHA FRETZ OZH:[id number] DOB: [birth year] DOA: 06/04/2014  Referring physician: ER physician. PCP: Dorrene German, MD  Chief Complaint: Shortness of breath.  History obtained from patient's family. Patient appears confused.  HPI: [name] is a 80 y.o. female with history of ESRD on hemodialysis, hypertension and asthma was brought to the ER after patient was complaining of shortness of breath. As per patient's family patient has been confused off and on for the last 1 week. Patient was just recently started on hemodialysis last Saturday and has had 3 dialysis sessions so far and the last one was yesterday. Following yesterday's session patient was complaining of shortness of breath and was brought to the ER. In the ER patient was found to be hypotensive but not febrile. UA showing features consistent with UTI. Patient was alert and awake appears mildly confused but follows commands and moves all extremities. Patient's family states that she has been confused over the last week and a half and there was felt to be secondary to patient's hypotensive spells. Patient also has been having diarrhea and was recently on antibiotics for UTI. Patient had at least 2-3 episodes yesterday. On exam patient is not in distress and abdomen appears benign.   Review of Systems: As presented in the history of presenting illness, rest negative.  Past Medical History  Diagnosis Date  . Gout   . Renal insufficiency   . Hypertension   . Hyperlipemia    Past Surgical History  Procedure Laterality Date  . Tubal ligation    . Av fistula placement     Social History:  reports that she has never smoked. She has never used smokeless tobacco. She reports that she does not drink alcohol or use illicit drugs. Where does patient live  lives at home. Can patient participate in ADLs? not sure. Allergies  Allergen Reactions  . Nsaids     REACTION: Avoid  NSAIDS(renal failure)    Family History:  Family History  Problem Relation Age of Onset  . Colon cancer Neg Hx   . Diabetes Mellitus II Neg Hx     Prior to Admission medications   Medication Sig Start Date End Date Taking? Authorizing Provider  cilostazol (PLETAL) 50 MG tablet Take 50 mg by mouth 2 (two) times daily.   Yes Historical Provider, MD  multivitamin (RENA-VIT) TABS tablet Take 1 tablet by mouth daily.   Yes Historical Provider, MD  nitroGLYCERIN (NITROSTAT) 0.4 MG SL tablet Place 0.4 mg under the tongue every 5 (five) minutes as needed for chest pain.   Yes Historical Provider, MD  albuterol (PROAIR HFA) 108 (90 BASE) MCG/ACT inhaler Inhale 1 puff into the lungs at bedtime.    Historical Provider, MD  amLODipine (NORVASC) 5 MG tablet Take 5 mg by mouth daily.    Historical Provider, MD  aspirin EC 81 MG tablet Take 81 mg by mouth daily.    Historical Provider, MD  furosemide (LASIX) 20 MG tablet Take 20 mg by mouth daily.     Historical Provider, MD  HYDROcodone-acetaminophen (NORCO/VICODIN) 5-325 MG per tablet Take 1-2 tablets by mouth every 4 (four) hours as needed for moderate pain or severe pain. Patient not taking: Reported on 06/04/2014 05/10/14   Olivia Mackie, MD  metoprolol succinate (TOPROL-XL) 25 MG 24 hr tablet Take 25 mg by mouth daily.    Historical Provider, MD  predniSONE (DELTASONE) 20 MG tablet Take 2 tablets (40 mg total) by mouth daily.  Patient not taking: Reported on 06/04/2014 05/10/14   [name], MD    Physical Exam: Filed Vitals:   06/04/14 2345 06/05/14 0015 06/05/14 0100 06/05/14 0115  BP: 95/50 91/49 91/70  81/50  Pulse:      Temp:      TempSrc:      Resp:   31 21  Weight:      SpO2:         General:  Poorly developed and nourished.  Eyes: Anicteric mild pallor.  ENT: No discharge from the ears eyes nose and mouth.  Neck: No mass felt.  Cardiovascular: S1-S2 heard.  Respiratory: No rhonchi or crepitations.  Abdomen: Soft  nontender bowel sounds present. No guarding or rigidity.  Skin: No rash.  Musculoskeletal: No edema.  Psychiatric: Appears confused.  Neurologic: Appears confused but follows commands and oriented to her name. Moves all extremities 5 x 5.  Labs on Admission:  Basic Metabolic Panel:  Recent Labs Lab 06/04/14 2207 06/04/14 2313  NA 135* 133*  K 4.0 3.8  CL 96 95*  CO2 25  --   GLUCOSE 84 85  BUN 5* <3*  CREATININE 1.66* 1.70*  CALCIUM 8.7  --    Liver Function Tests:  Recent Labs Lab 06/04/14 2207  AST 17  ALT 9  ALKPHOS 60  BILITOT 0.6  PROT 6.2  ALBUMIN 2.2*   No results for input(s): LIPASE, AMYLASE in the last 168 hours. No results for input(s): AMMONIA in the last 168 hours. CBC:  Recent Labs Lab 06/04/14 2207 06/04/14 2313  WBC 6.7  --   HGB 9.4* 10.9*  HCT 30.3* 32.0*  MCV 91.3  --   PLT 199  --    Cardiac Enzymes: No results for input(s): CKTOTAL, CKMB, CKMBINDEX, TROPONINI in the last 168 hours.  BNP (last 3 results) No results for input(s): PROBNP in the last 8760 hours. CBG: No results for input(s): GLUCAP in the last 168 hours.  Radiological Exams on Admission: Dg Chest Port 1 View  06/04/2014   CLINICAL DATA:  Asthma and hypertension.  Shortness of breath  EXAM: PORTABLE CHEST - 1 VIEW  COMPARISON:  05/10/2014  FINDINGS: Normal heart size. No pleural effusion or edema. Lung volumes are low. No airspace consolidation. Coarsened interstitial markings are identified bilaterally.  IMPRESSION: 1. Low lung volumes. 2. No acute findings noted.   Electronically Signed   By: Signa Kellaylor  Stroud M.D.   On: 06/04/2014 23:45     Assessment/Plan Principal Problem:   SOB (shortness of breath) Active Problems:   ESRD on dialysis   Hypotension   UTI (lower urinary tract infection)   Diarrhea   1. Hypotension - patient does not appear to be septic but does have UTI for which patient has been placed on ceftriaxone. Hypotension probably from fluids  balance and also diarrhea. Patient did receive 500 mL bolus in the ER and will continue to monitor. Hold antihypertensives. 2. Shortness of breath - patient does have a history of asthma but on exam patient is not wheezing and at the time of my exam patient is not in respiratory distress. Since patient is tachycardic we will check d-dimer and if positive will get a VQ scan. 3. Acute encephalopathy - cause not clear. Check CT head. Ammonia levels. Probably could be from UTI and metabolic changes. 4. ESRD on hemodialysis on Monday Wednesday and Friday - patient had dialysis yesterday. Consult nephrologist in a.m. for further dialysis. Plan is to schedule patient for next  week Tuesday Thursday and Saturday as per the family. 5. Asthma - patient is not wheezing. 6. History of hypertension - hold antihypertensives. 7. Diarrhea - check C. difficile as patient was recently on antibiotics. 8. UTI - patient placed on ceftriaxone and follow cultures. 9. Anemia probably from kidney disease - follow CBC.    Code Status: Full code.  Family Communication: Family at the bedside.  Disposition Plan: Admit to inpatient.    Aashritha Miedema N. [facility] Pager [phone].  If 7PM-7AM, please contact night-coverage [email or web address] Password [email or web address] 06/05/2014, 2:26 AM

## 2014-06-06 DIAGNOSIS — R609 Edema, unspecified: Secondary | ICD-10-CM

## 2014-06-06 DIAGNOSIS — R410 Disorientation, unspecified: Secondary | ICD-10-CM

## 2014-06-06 DIAGNOSIS — F0390 Unspecified dementia without behavioral disturbance: Secondary | ICD-10-CM | POA: Insufficient documentation

## 2014-06-06 DIAGNOSIS — I9589 Other hypotension: Secondary | ICD-10-CM

## 2014-06-06 DIAGNOSIS — I959 Hypotension, unspecified: Secondary | ICD-10-CM

## 2014-06-06 LAB — BASIC METABOLIC PANEL
Anion gap: 11 (ref 5–15)
BUN: 13 mg/dL (ref 6–23)
CALCIUM: 8.9 mg/dL (ref 8.4–10.5)
CO2: 27 meq/L (ref 19–32)
Chloride: 97 mEq/L (ref 96–112)
Creatinine, Ser: 3.22 mg/dL — ABNORMAL HIGH (ref 0.50–1.10)
GFR calc Af Amer: 15 mL/min — ABNORMAL LOW (ref >90)
GFR, EST NON AFRICAN AMERICAN: 13 mL/min — AB (ref >90)
Glucose, Bld: 67 mg/dL — ABNORMAL LOW (ref 70–99)
Potassium: 3.7 mEq/L (ref 3.7–5.3)
Sodium: 135 mEq/L — ABNORMAL LOW (ref 137–147)

## 2014-06-06 LAB — GLUCOSE, CAPILLARY: Glucose-Capillary: 76 mg/dL (ref 70–99)

## 2014-06-06 LAB — CBC
HEMATOCRIT: 26.3 % — AB (ref 36.0–46.0)
HEMOGLOBIN: 8.1 g/dL — AB (ref 12.0–15.0)
MCH: 28.7 pg (ref 26.0–34.0)
MCHC: 30.8 g/dL (ref 30.0–36.0)
MCV: 93.3 fL (ref 78.0–100.0)
Platelets: 196 10*3/uL (ref 150–400)
RBC: 2.82 MIL/uL — AB (ref 3.87–5.11)
RDW: 15.4 % (ref 11.5–15.5)
WBC: 6.4 10*3/uL (ref 4.0–10.5)

## 2014-06-06 LAB — CORTISOL: Cortisol, Plasma: 16.9 ug/dL

## 2014-06-06 MED ORDER — SODIUM CHLORIDE 0.9 % IV SOLN: INTRAVENOUS | Status: DC

## 2014-06-06 MED ORDER — MIDODRINE HCL 5 MG PO TABS
10.0000 mg | ORAL_TABLET | Freq: Three times a day (TID) | ORAL | Status: DC
  Administered 2014-06-06 – 2014-06-09 (×9): 10 mg via ORAL
  Filled 2014-06-06 (×12): qty 2

## 2014-06-06 MED ORDER — NEPRO/CARBSTEADY PO LIQD
237.0000 mL | Freq: Two times a day (BID) | ORAL | Status: DC
  Administered 2014-06-06 – 2014-06-09 (×4): 237 mL via ORAL
  Filled 2014-06-06 (×4): qty 237

## 2014-06-06 MED ORDER — HEPARIN (PORCINE) IN NACL 100-0.45 UNIT/ML-% IJ SOLN
1200.0000 [IU]/h | INTRAMUSCULAR | Status: AC
  Administered 2014-06-06: 700 [IU]/h via INTRAVENOUS
  Administered 2014-06-07: 950 [IU]/h via INTRAVENOUS
  Filled 2014-06-06 (×5): qty 250

## 2014-06-06 NOTE — Progress Notes (Signed)
[facility] Rounding Note Subjective:  Remains confused Knows her name  Knows hospital but has no idea which one Wants to know "why she can't put her panties on" Does not have any idea why she is here Does not remember seeing me from yesterday Does not know the current year. Thinks she was born in [id number] (records show [id number])  Objective Vital signs in last 24 hours: Filed Vitals:   06/06/14 0500 06/06/14 0600 06/06/14 0800 06/06/14 0900  BP: 84/40 87/34 75/40  92/41  Pulse: 75 73 86 84  Temp:   98.6 F (37 C)   TempSrc:   Oral   Resp: 14 16 18 18   Height:      Weight:      SpO2: 98% 97% 100% 100%   Weight change: -0.6 kg (-1 lb 5.2 oz)  Intake/Output Summary (Last 24 hours) at 06/06/14 0936 Last data filed at 06/06/14 0900  Gross per 24 hour  Intake    480 ml  Output    650 ml  Net   -170 ml   Physical Exam:  Blood pressure 92/41, pulse 84, temperature 98.6 F (37 C), temperature source Oral, resp. rate 18, height 5' (1.524 m), weight 56.1 kg (123 lb 10.9 oz), SpO2 100 %. General: Thin elderly BF. NAD. Lying in bed. Awake and alert, answers questions, but remains confused Neck: Supple. No JVD   Lungs: Clear bilaterally to auscultation without wheezes, rales, or rhonchi. Heart: Regular today in the 80's  S1S2 No S3   Abdomen: Soft, non-tender, non-distended with normoactive bowel sounds. No rebound/guarding. No obvious abdominal masses. Healed old midline incision "I don't know what that came from" Lower extremities: No edema or ischemic changes  Neuro: Alert and oriented to person only.  Psych: Does not answer questions appropriately  Dialysis Access: L AVF +b/t bruised, s/p infiltrate 11/17 (large hematoma and ecchymosis) Basic Metabolic Panel:  Recent Labs Lab 06/04/14 2207 06/04/14 2313 06/05/14 0317 06/06/14 0225  NA 135* 133* 135* 135*  K 4.0 3.8 3.9 3.7  CL 96 95* 96 97  CO2 25  --  29 27  GLUCOSE 84 85 79 67*  BUN 5* <3* 7 13  CREATININE  1.66* 1.70* 1.87* 3.22*  CALCIUM 8.7  --  8.6 8.9    Recent Labs Lab 06/04/14 2207 06/05/14 0317  AST 17 16  ALT 9 8  ALKPHOS 60 55  BILITOT 0.6 0.4  PROT 6.2 5.8*  ALBUMIN 2.2* 2.1*   No results for input(s): LIPASE, AMYLASE in the last 168 hours.  Recent Labs Lab 06/05/14 0317  AMMONIA 13     Recent Labs Lab 06/04/14 2207 06/04/14 2313 06/05/14 0317 06/06/14 0225  WBC 6.7  --  6.4 6.4  NEUTROABS  --   --  4.7  --   HGB 9.4* 10.9* 9.6* 8.1*  HCT 30.3* 32.0* 30.3* 26.3*  MCV 91.3  --  94.1 93.3  PLT 199  --  180 196   Recent Labs Lab 06/05/14 0317  TROPONINI <0.30  MRSA PCR neg Urine culture + E Coli Sensitivities pending  Recent Labs Lab 06/06/14 0818  GLUCAP 76   Studies/Results: Ct Head Wo Contrast  06/05/2014   CLINICAL DATA:  Intermittent confusion associated with hypertension.  EXAM: CT HEAD WITHOUT CONTRAST  TECHNIQUE: Contiguous axial images were obtained from the base of the skull through the vertex without intravenous contrast.  COMPARISON:  None.  FINDINGS: Skull and Sinuses:Negative for fracture or destructive process. The mastoids,  middle ears, and imaged paranasal sinuses are clear.  Orbits: No acute abnormality.  Brain: No evidence of acute abnormality, such as acute infarction, hemorrhage, hydrocephalus, or mass lesion/mass effect. There is generalized volume loss consistent with atrophy. Mild chronic small vessel disease with ischemic gliosis noted around the frontal horn of the left lateral ventricle.  IMPRESSION: 1. No acute intracranial disease. 2. Brain atrophy and mild chronic small vessel ischemia.   Electronically Signed   By: Tiburcio Pea M.D.   On: 06/05/2014 03:17   Dg Chest Port 1 View  06/04/2014   CLINICAL DATA:  Asthma and hypertension.  Shortness of breath  EXAM: PORTABLE CHEST - 1 VIEW  COMPARISON:  05/10/2014  FINDINGS: Normal heart size. No pleural effusion or edema. Lung volumes are low. No airspace consolidation.  Coarsened interstitial markings are identified bilaterally.  IMPRESSION: 1. Low lung volumes. 2. No acute findings noted.   Electronically Signed   By: Signa Kell M.D.   On: 06/04/2014 23:45   Medications: . sodium chloride 10 mL/hr at 06/05/14 1900   . albuterol  3 mL Inhalation QHS  . antiseptic oral rinse  7 mL Mouth Rinse BID  . aspirin EC  81 mg Oral Daily  . [START ON 06/07/2014] calcitRIOL  0.5 mcg Oral Q T,Th,Sa-HD  . cefTRIAXone (ROCEPHIN)  IV  1 g Intravenous Q24H  . cinacalcet  30 mg Oral Q breakfast  . [START ON 06/07/2014] darbepoetin (ARANESP) injection - DIALYSIS  40 mcg Intravenous Q Sat-HD  . enoxaparin (LOVENOX) injection  30 mg Subcutaneous Q24H  . [START ON 06/07/2014] ferric gluconate (FERRLECIT/NULECIT) IV  125 mg Intravenous Q T,Th,Sa-HD  . midodrine  10 mg Oral TID WC  . multivitamin  1 tablet Oral QHS  . pneumococcal 23 valent vaccine  0.5 mL Intramuscular Tomorrow-1000  . sevelamer carbonate  800 mg Oral TID WC  . sodium chloride  3 mL Intravenous Q12H   I  have reviewed scheduled and prn medications.  Dialysis Orders: TTS [facility] 4hr 56kgs 4K/2.25Ca 5700u Heparin L AVF 400/800  Calcitriol 0.56mcg No Aranesp started this admission 40 mcg QSat Venofer 100mg s x6 stop 12/10   Assessment/Plan: 1. Hypotension- Per outpt HD records has been hypotensive since starting HD (walked in for her very first treatment with low BP and despite being advised to stop, kept taking BP meds so midodrine was not started outpt but has been started since hospital admission).  Off BP meds now. Cortisol 16.9. TSH 1.35 normal. ECHO 11/26 EF not mentioned but does state "no wall motion abnormalities" and no valvular issues, in particular aortic valve OK.  Midodrine now at 10 mg TID. BP's still soft. 2. UTI- rocephin per primary. Culture + for [facility] with pending sensitivities 3. Diarrhea- Cdiff ordered but no stool sent (no diarrhea) 4. AMS- per outpt HD notes pt has  been confused. Head CT with no acute findings.  Seems more like a dementia with decompensation to me. No family here 5. SOB- d dimer elevated- LE dopplers still pending. Chest xray clear. sats 100% on RA 6. ESRD - TTS [facility]. Next HD sat. K+3.7 4K+bath. No volume off (AT EDW of 56 at this time) 7. Anemia - Substantial drop in Hb last 24 hours to 8.1 Fe bolus started for tsat 19 (11/19). Starting Aranesp with Saturday HD. Check stools. 8. Metabolic bone disease - Ca+8.6/10.1 corrected. Phos 7/pth 839 11/19- cont renvela, sensipar and calcitriol  9. Nutrition - Alb 2.1 renal diet. renal vit  Camille Balynthia Tawanna Funk, MD [facility] [phone] pager 06/06/2014, 9:36 AM

## 2014-06-06 NOTE — Progress Notes (Signed)
INITIAL NUTRITION ASSESSMENT  DOCUMENTATION CODES Per approved criteria  -Not Applicable   INTERVENTION: Nepro Shake po BID, each supplement provides 425 kcal and 19 grams protein  NUTRITION DIAGNOSIS: Increased nutrient needs related to wound healing, ESRD on HD as evidenced by estimated needs.   Goal: Pt will meet >90% of estimated nutritional needs  Monitor:  PO/supplement intake, labs, weight changes, I/O's  Reason for Assessment: MST=2  80 y.o. female  Admitting Dx: SOB (shortness of breath)  80 year old female with history of ESRD-HD who had history of hypertension but has been hypotensive ever since starting dialysis as outpatient. Presents to the hospital with SOB and found to be hypotensive. PCCM called on consultation for that. Denies fever or signs of infection.  ASSESSMENT: Pt admitted with SOB.  Pt unable to give reliable hx due to dementia. Nutrition-focused physical exam deferred due to pt bathing at time of visit.  Pt intake has been poor, noted 20% meal completion.  Wt hx reveals a progressive wt loss over the past several years, including a 18.5% wt loss x 1 year.  No supplement orders noted. RD to add supplement due to poor PO intake, wt loss, and increased nutrient needs due to wound healing and HD.  Labs reviewed. Na: 135, Creat: 3.22, Glucose: 67, CBGS: 76.   Height: Ht Readings from Last 1 Encounters:  06/06/14 5' (1.524 m)    Weight: Wt Readings from Last 1 Encounters:  06/06/14 123 lb 10.9 oz (56.1 kg)    Ideal Body Weight: 100#  % Ideal Body Weight: 123%  Wt Readings from Last 10 Encounters:  06/06/14 123 lb 10.9 oz (56.1 kg)  04/10/14 125 lb (56.7 kg)  05/07/13 151 lb (68.493 kg)  03/13/13 154 lb (69.854 kg)  11/14/11 180 lb (81.647 kg)  11/08/11 180 lb (81.647 kg)  09/08/08 208 lb (94.348 kg)  04/29/08 209 lb (94.802 kg)  11/21/07 213 lb (96.616 kg)  07/18/07 217 lb (98.431 kg)    Usual Body Weight: 154#  % Usual Body  Weight: 80%  BMI:  Body mass index is 24.15 kg/(m^2). Normal weight range  Estimated Nutritional Needs: Kcal: 1700-1900 Protein: 84-94 grams Fluid: 1.7-1.9 L  Skin: stage II pressure ulcer on sacrum  Diet Order: Diet renal W/123900mL fluid restriction  EDUCATION NEEDS: -Education not appropriate at this time   Intake/Output Summary (Last 24 hours) at 06/06/14 0934 Last data filed at 06/06/14 0900  Gross per 24 hour  Intake    480 ml  Output    650 ml  Net   -170 ml    Last BM: 06/03/14  Labs:   Recent Labs Lab 06/04/14 2207 06/04/14 2313 06/05/14 0317 06/06/14 0225  NA 135* 133* 135* 135*  K 4.0 3.8 3.9 3.7  CL 96 95* 96 97  CO2 25  --  29 27  BUN 5* <3* 7 13  CREATININE 1.66* 1.70* 1.87* 3.22*  CALCIUM 8.7  --  8.6 8.9  GLUCOSE 84 85 79 67*    CBG (last 3)   Recent Labs  06/06/14 0818  GLUCAP 76    Scheduled Meds: . albuterol  3 mL Inhalation QHS  . antiseptic oral rinse  7 mL Mouth Rinse BID  . aspirin EC  81 mg Oral Daily  . [START ON 06/07/2014] calcitRIOL  0.5 mcg Oral Q T,Th,Sa-HD  . cefTRIAXone (ROCEPHIN)  IV  1 g Intravenous Q24H  . cinacalcet  30 mg Oral Q breakfast  . [START ON  06/07/2014] darbepoetin (ARANESP) injection - DIALYSIS  40 mcg Intravenous Q Sat-HD  . enoxaparin (LOVENOX) injection  30 mg Subcutaneous Q24H  . [START ON 06/07/2014] ferric gluconate (FERRLECIT/NULECIT) IV  125 mg Intravenous Q T,Th,Sa-HD  . midodrine  10 mg Oral TID WC  . multivitamin  1 tablet Oral QHS  . pneumococcal 23 valent vaccine  0.5 mL Intramuscular Tomorrow-1000  . sevelamer carbonate  800 mg Oral TID WC  . sodium chloride  3 mL Intravenous Q12H    Continuous Infusions: . sodium chloride 10 mL/hr at 06/05/14 1900    Past Medical History  Diagnosis Date  . Gout   . Renal insufficiency   . Hypertension   . Hyperlipemia     Past Surgical History  Procedure Laterality Date  . Tubal ligation    . Av fistula placement      Alsie Younes A.  Mayford KnifeWilliams, RD, LDN Pager: [phone] After hours Pager: [phone]

## 2014-06-06 NOTE — Progress Notes (Signed)
*  Preliminary Results* Bilateral lower extremity venous duplex completed. The right lower extremity is negative for deep vein thrombosis. The left lower extremity is positive for acute deep vein thrombosis involving the left saphenofemoral junction, common femoral, and profunda femoral veins. There is evidence of a small left Baker's cyst.  Preliminary results discussed with Corliss Marcuseal, RN.  06/06/2014 3:22 PM  Gertie FeyMichelle Avigayil Ton, RVT, RDCS, RDMS

## 2014-06-06 NOTE — Progress Notes (Signed)
Name: [name] MRN: [id number] DOB: 26-Jul-1932    ADMISSION DATE:  06/04/2014 CONSULTATION DATE:  06/05/2014  REFERRING MD :  TRH  CHIEF COMPLAINT:  Hypotension  BRIEF PATIENT DESCRIPTION:  80 year old female with history of ESRD-HD who had history of hypertension but has been hypotensive ever since starting dialysis as outpatient.  Presents to the hospital with SOB and found to be hypotensive.  PCCM called on consultation for that.  Denies fever or signs of infection.  SIGNIFICANT EVENTS  11/26 borderline BP and confusion  STUDIES:  Echo 11/26 with grade 1-2 diastolic dysfunction  SUBJECTIVE: Marginal BP overnight but no other complaints  VITAL SIGNS: Temp:  [97.8 F (36.6 C)-99.3 F (37.4 C)] 98.6 F (37 C) (11/27 0800) Pulse Rate:  [63-108] 97 (11/27 1000) Resp:  [12-29] 24 (11/27 1000) BP: (69-130)/(24-56) 93/39 mmHg (11/27 1000) SpO2:  [97 %-100 %] 100 % (11/27 1000) Weight:  [56.1 kg (123 lb 10.9 oz)] 56.1 kg (123 lb 10.9 oz) (11/27 0433)  PHYSICAL EXAMINATION: General:  Chronically ill appearing female, NAD. Neuro:  Alert and interactive.  Moving all ext to command. HEENT:  [place]/AT, PERRL, EOM-I and MMM. Cardiovascular:  RRR, Nl S1/S2, -M/R/G. Lungs:  CTA bilaterally. Abdomen:  Soft, NT, ND and +BS. Musculoskeletal:  -edema and -tenderness. Skin:  Intact   Recent Labs Lab 06/04/14 2207 06/04/14 2313 06/05/14 0317 06/06/14 0225  NA 135* 133* 135* 135*  K 4.0 3.8 3.9 3.7  CL 96 95* 96 97  CO2 25  --  29 27  BUN 5* <3* 7 13  CREATININE 1.66* 1.70* 1.87* 3.22*  GLUCOSE 84 85 79 67*    Recent Labs Lab 06/04/14 2207 06/04/14 2313 06/05/14 0317 06/06/14 0225  HGB 9.4* 10.9* 9.6* 8.1*  HCT 30.3* 32.0* 30.3* 26.3*  WBC 6.7  --  6.4 6.4  PLT 199  --  180 196   Ct Head Wo Contrast  06/05/2014   CLINICAL DATA:  Intermittent confusion associated with hypertension.  EXAM: CT HEAD WITHOUT CONTRAST  TECHNIQUE: Contiguous axial images were obtained  from the base of the skull through the vertex without intravenous contrast.  COMPARISON:  None.  FINDINGS: Skull and Sinuses:Negative for fracture or destructive process. The mastoids, middle ears, and imaged paranasal sinuses are clear.  Orbits: No acute abnormality.  Brain: No evidence of acute abnormality, such as acute infarction, hemorrhage, hydrocephalus, or mass lesion/mass effect. There is generalized volume loss consistent with atrophy. Mild chronic small vessel disease with ischemic gliosis noted around the frontal horn of the left lateral ventricle.  IMPRESSION: 1. No acute intracranial disease. 2. Brain atrophy and mild chronic small vessel ischemia.   Electronically Signed   By: Tiburcio PeaJonathan  Watts M.D.   On: 06/05/2014 03:17   Dg Chest Port 1 View  06/04/2014   CLINICAL DATA:  Asthma and hypertension.  Shortness of breath  EXAM: PORTABLE CHEST - 1 VIEW  COMPARISON:  05/10/2014  FINDINGS: Normal heart size. No pleural effusion or edema. Lung volumes are low. No airspace consolidation. Coarsened interstitial markings are identified bilaterally.  IMPRESSION: 1. Low lung volumes. 2. No acute findings noted.   Electronically Signed   By: Signa Kellaylor  Stroud M.D.   On: 06/04/2014 23:45    ASSESSMENT / PLAN:  Asymptomatic hypotension Amlodipine, metoprolol PTA - note long T1/2 of amlodipine Also on Pletal which has side effect of hypotension listed Recent course of prednisone but does not appear to be on chronically Might be relatively  hypovolemic (No findings on exam or CXR to suspect volume overload)  Cortisol level of 16.9.  Dyspnea - appears resolved as she is very comfortable on RA. CXR entirely normal. Doubt PE  Confusion of one month's duration - appears to have dementia  Pyuria - doubt this is septic shock  REC: Would trial stress dose steroids and see if BP will improve, if not then would d/c as the level <20 is acceptable if not under  stress DC Pletal  Cont holding anti-hypertensives Echo with diastolic dysfunction V/Q scan and LE duplex scanning ordered by primary team, not impressed by PE suspicion here. Antibiotics per primary team Recommend involvement of palliative care. As long as awake and interactive (recognizing the dementia component here) would treat symptomatically not the numbers. Continue midodrine.   PCCM will sign off, please call back if needed.  Alyson ReedyWesam G. Charlotte Fidalgo, M.D. [facility]. Pager: [phone]. After hours pager: [phone].   06/06/2014, 10:22 AM

## 2014-06-06 NOTE — Progress Notes (Signed)
ANTICOAGULATION CONSULT NOTE - Initial Consult  Pharmacy Consult:  Heparin Indication:  New DVT  Allergies  Allergen Reactions  . Nsaids     REACTION: Avoid NSAIDS(renal failure)    Patient Measurements: Height: 5' (152.4 cm) Weight: 123 lb 10.9 oz (56.1 kg) IBW/kg (Calculated) : 45.5 Heparin Dosing Weight: 56 kg  Vital Signs: Temp: 97.7 F (36.5 C) (11/27 1206) Temp Source: Oral (11/27 0800) BP: 92/57 mmHg (11/27 1400) Pulse Rate: 67 (11/27 1400)  Labs:  Recent Labs  06/04/14 2207 06/04/14 2313 06/05/14 0317 06/06/14 0225  HGB 9.4* 10.9* 9.6* 8.1*  HCT 30.3* 32.0* 30.3* 26.3*  PLT 199  --  180 196  CREATININE 1.66* 1.70* 1.87* 3.22*  TROPONINI  --   --  <0.30  --     Estimated Creatinine Clearance: 11.1 mL/min (by C-G formula based on Cr of 3.22).   Medical History: Past Medical History  Diagnosis Date  . Gout   . Renal insufficiency   . Hypertension   . Hyperlipemia       Assessment: [age] YOF with new DVT to start IV heparin.  Noted patient received Lovenox this AM.  Baseline labs and home meds reviewed.   Goal of Therapy:  Heparin level 0.3-0.7 units/ml Monitor platelets by anticoagulation protocol: Yes    Plan:  - Heparin gtt at 700 units/hr, no bolus since Lovenox given this AM - Check 8 hr HL - Daily HL / CBC - F/U V/Q scan - F/U oral AC when possible    Hriday Stai D. Laney Potashang, PharmD, BCPS Pager:  [phone] 06/06/2014, 3:54 PM

## 2014-06-06 NOTE — Progress Notes (Addendum)
PATIENT DETAILS Name: [name] Age: 80 y.o. Sex: female Date of Birth: [birth year] Admit Date: 06/04/2014 Admitting Physician Eduard ClosArshad N Kakrakandy, MD RUE:[id number],XBJYNPCP:[name],[name], MD  Subjective: BP some what better, remains confused, but answers some questions appropriately  Assessment/Plan: Principal Problem:  Hypotension:suspect multifactorial-recently started on HD, Still taking BP meds when not supposed to and ?from UTI. Given IVF boluses and started on Midodrine-with some improvement. Echo neg for pericardial effusion. Cortisol ok. Increase Midodrine to 10 mg TID and continue to follow. Appreciate CCM and Renal assistance.   Active Problems:  Reported hx of Shortness of breath:not hypoxic, no SOB while hospitalized. Since D Dimer significantly elevated, will check Doppler legs, if negative will not pursue any further tests as low probability for PE.    Addendum:Doppler positive for DVT. No RV Strain seen on Echo, doubt hypotension from PE . Start Heparin gtt per pharmacy and follow.  WGN:FAOZHYQMTI:continue Rocephin. Urine Culture positive for E Coli, sensitivity pending. Apart from encephalopathy-no other symptoms. Afebrile and without any leukocytosis.  Encephalopathy:suspect hx of mild dementia/cognitive dysfunction. CT head negative for acute abnormalities.Worsened by UTI, dsundowning. Renal doubts Dialysis Dysequilibrium Syndrome. Continue IV Abx, supportive care  Anemia:secondary to IVF dilution and anemia of CKD. No overt bleeding evident. Monitor, Erythroppoetin/IV Fe per renal  Reported hx of Diarrhea:none witnessed this am. Await Stool C Diff PCR.  Anemia:secondary to CKD. Monitor periodically  VHQ:IONGEHTN:given hypotension-all antihypertensives on hold  Asthma:lungs clear on exam.   Disposition: Remain inpatient-suspect should be stable to transfer to Telemetry  Antibiotics:  See below  Anti-infectives    Start     Dose/Rate Route Frequency Ordered Stop   06/05/14  2200  cefTRIAXone (ROCEPHIN) 1 g in dextrose 5 % 50 mL IVPB - Premix     1 g100 mL/hr over 30 Minutes Intravenous Every 24 hours 06/05/14 0225     06/05/14 0100  cefTRIAXone (ROCEPHIN) 1 g in dextrose 5 % 50 mL IVPB     1 g100 mL/hr over 30 Minutes Intravenous  Once 06/05/14 0053 06/05/14 0131      DVT Prophylaxis: Prophylactic Lovenox  Code Status: Full code  Family Communication Pharmacist, hospitalMichelle Sill-daughter-over the phone  Procedures:  None  CONSULTS:  nephrology   PCCM  Time spent 40 minutes-which includes 50% of the time with face-to-face with patient/ family and coordinating care related to the above assessment and plan.  MEDICATIONS: Scheduled Meds: . albuterol  3 mL Inhalation QHS  . antiseptic oral rinse  7 mL Mouth Rinse BID  . aspirin EC  81 mg Oral Daily  . [START ON 06/07/2014] calcitRIOL  0.5 mcg Oral Q T,Th,Sa-HD  . cefTRIAXone (ROCEPHIN)  IV  1 g Intravenous Q24H  . cinacalcet  30 mg Oral Q breakfast  . [START ON 06/07/2014] darbepoetin (ARANESP) injection - DIALYSIS  40 mcg Intravenous Q Sat-HD  . enoxaparin (LOVENOX) injection  30 mg Subcutaneous Q24H  . [START ON 06/07/2014] ferric gluconate (FERRLECIT/NULECIT) IV  125 mg Intravenous Q T,Th,Sa-HD  . midodrine  10 mg Oral TID WC  . multivitamin  1 tablet Oral QHS  . pneumococcal 23 valent vaccine  0.5 mL Intramuscular Tomorrow-1000  . sevelamer carbonate  800 mg Oral TID WC  . sodium chloride  3 mL Intravenous Q12H   Continuous Infusions: . sodium chloride 10 mL/hr at 06/05/14 1900   PRN Meds:.acetaminophen **OR** acetaminophen, HYDROcodone-acetaminophen, ondansetron **OR** ondansetron (ZOFRAN) IV    PHYSICAL EXAM: Vital signs in last  24 hours: Filed Vitals:   06/06/14 0433 06/06/14 0500 06/06/14 0600 06/06/14 0800  BP:  84/40 87/34 75/40   Pulse:  75 73 86  Temp:    98.6 F (37 C)  TempSrc:    Oral  Resp:  14 16 18   Height: 5' (1.524 m)     Weight: 56.1 kg (123 lb 10.9 oz)     SpO2:  98%  97% 100%    Weight change: -0.6 kg (-1 lb 5.2 oz) Filed Weights   06/04/14 2245 06/05/14 0217 06/06/14 0433  Weight: 56.7 kg (125 lb) 55.6 kg (122 lb 9.2 oz) 56.1 kg (123 lb 10.9 oz)   Body mass index is 24.15 kg/(m^2).   Gen Exam: Awake,pleasant confused, with clear speech.  Actually follows some commands and answers some questions appropriately today Neck: Supple, No JVD.   Chest: B/L Clear.   CVS: S1 S2 Regular, no murmurs.  Abdomen: soft, BS +, non tender, non distended.  Extremities: no edema, lower extremities warm to touch. Neurologic: Non Focal.   Skin: No Rash.   Wounds: N/A.    Intake/Output from previous day:  Intake/Output Summary (Last 24 hours) at 06/06/14 0858 Last data filed at 06/06/14 0600  Gross per 24 hour  Intake    360 ml  Output    650 ml  Net   -290 ml     LAB RESULTS: CBC  Recent Labs Lab 06/04/14 2207 06/04/14 2313 06/05/14 0317 06/06/14 0225  WBC 6.7  --  6.4 6.4  HGB 9.4* 10.9* 9.6* 8.1*  HCT 30.3* 32.0* 30.3* 26.3*  PLT 199  --  180 196  MCV 91.3  --  94.1 93.3  MCH 28.3  --  29.8 28.7  MCHC 31.0  --  31.7 30.8  RDW 15.0  --  14.7 15.4  LYMPHSABS  --   --  1.0  --   MONOABS  --   --  0.7  --   EOSABS  --   --  0.0  --   BASOSABS  --   --  0.0  --     Chemistries   Recent Labs Lab 06/04/14 2207 06/04/14 2313 06/05/14 0317 06/06/14 0225  NA 135* 133* 135* 135*  K 4.0 3.8 3.9 3.7  CL 96 95* 96 97  CO2 25  --  29 27  GLUCOSE 84 85 79 67*  BUN 5* <3* 7 13  CREATININE 1.66* 1.70* 1.87* 3.22*  CALCIUM 8.7  --  8.6 8.9    CBG:  Recent Labs Lab 06/06/14 0818  GLUCAP 76    GFR Estimated Creatinine Clearance: 11.1 mL/min (by C-G formula based on Cr of 3.22).  Coagulation profile No results for input(s): INR, PROTIME in the last 168 hours.  Cardiac Enzymes  Recent Labs Lab 06/05/14 0317  TROPONINI <0.30    Invalid input(s): POCBNP  Recent Labs  06/05/14 0317  DDIMER 7.97*   No results for input(s):  HGBA1C in the last 72 hours. No results for input(s): CHOL, HDL, LDLCALC, TRIG, CHOLHDL, LDLDIRECT in the last 72 hours.  Recent Labs  06/05/14 1230  TSH 1.350   No results for input(s): VITAMINB12, FOLATE, FERRITIN, TIBC, IRON, RETICCTPCT in the last 72 hours. No results for input(s): LIPASE, AMYLASE in the last 72 hours.  Urine Studies No results for input(s): UHGB, CRYS in the last 72 hours.  Invalid input(s): UACOL, UAPR, USPG, UPH, UTP, UGL, UKET, UBIL, UNIT, UROB, ULEU, UEPI, UWBC, URBC, UBAC, CAST, UCOM,  BILUA  MICROBIOLOGY: Recent Results (from the past 240 hour(s))  Urine culture     Status: None (Preliminary result)   Collection Time: 06/05/14 12:14 AM  Result Value Ref Range Status   Specimen Description URINE, CATHETERIZED  Final   Special Requests NONE  Final   Culture  Setup Time   Final    06/05/2014 05:41 Performed at [facility]   Final    30,000 COLONIES/ML Performed at [facility]    Culture   Final    ESCHERICHIA COLI Performed at [facility]    Report Status PENDING  Incomplete  MRSA PCR Screening     Status: None   Collection Time: 06/05/14  2:09 AM  Result Value Ref Range Status   MRSA by PCR NEGATIVE NEGATIVE Final    Comment:        The GeneXpert MRSA Assay (FDA approved for NASAL specimens only), is one component of a comprehensive MRSA colonization surveillance program. It is not intended to diagnose MRSA infection nor to guide or monitor treatment for MRSA infections.     RADIOLOGY STUDIES/RESULTS: Dg Chest 2 View  05/10/2014   CLINICAL DATA:  Lower extremity swelling for several days, joint pain for 2 days. Chronic shortness of breath. Renal insufficiency.  EXAM: CHEST  2 VIEW  COMPARISON:  Chest radiograph February 26, 2008  FINDINGS: The cardiac silhouette appears moderately enlarged, unchanged. Tortuous calcified aorta. Mild chronic interstitial changes without pleural effusions or focal  consolidation. No pneumothorax. Patient is osteopenic.  IMPRESSION: Stable cardiomegaly, no acute pulmonary process.   Electronically Signed   By: Awilda Metro   On: 05/10/2014 01:41   Ct Head Wo Contrast  06/05/2014   CLINICAL DATA:  Intermittent confusion associated with hypertension.  EXAM: CT HEAD WITHOUT CONTRAST  TECHNIQUE: Contiguous axial images were obtained from the base of the skull through the vertex without intravenous contrast.  COMPARISON:  None.  FINDINGS: Skull and Sinuses:Negative for fracture or destructive process. The mastoids, middle ears, and imaged paranasal sinuses are clear.  Orbits: No acute abnormality.  Brain: No evidence of acute abnormality, such as acute infarction, hemorrhage, hydrocephalus, or mass lesion/mass effect. There is generalized volume loss consistent with atrophy. Mild chronic small vessel disease with ischemic gliosis noted around the frontal horn of the left lateral ventricle.  IMPRESSION: 1. No acute intracranial disease. 2. Brain atrophy and mild chronic small vessel ischemia.   Electronically Signed   By: Tiburcio Pea M.D.   On: 06/05/2014 03:17   Dg Chest Port 1 View  06/04/2014   CLINICAL DATA:  Asthma and hypertension.  Shortness of breath  EXAM: PORTABLE CHEST - 1 VIEW  COMPARISON:  05/10/2014  FINDINGS: Normal heart size. No pleural effusion or edema. Lung volumes are low. No airspace consolidation. Coarsened interstitial markings are identified bilaterally.  IMPRESSION: 1. Low lung volumes. 2. No acute findings noted.   Electronically Signed   By: Signa Kell M.D.   On: 06/04/2014 23:45    Jeoffrey Massed, MD  [facility] Pager:[phone]  If 7PM-7AM, please contact night-coverage [email or web address] Password [email or web address] 06/06/2014, 8:58 AM   LOS: 2 days

## 2014-06-06 NOTE — Progress Notes (Signed)
NURSING PROGRESS NOTE  [name] DouglasMattie M Page [id number] Transfer Data: 06/06/2014 6:50 PM Attending Provider: Maretta BeesShanker M Ghimire, MD OZH:[id number],ONGEXPCP:[name],[name], MD Code Status: FULL  [name] DouglasMattie M Page is a 80 y.o. female patient transferred from [facility]  -No acute distress noted.  -No complaints of shortness of breath.  -No complaints of chest pain.     Blood pressure 118/62, pulse 75, temperature 99.6 F (37.6 C), temperature source Oral, resp. rate 20, height 5' (1.524 m), weight 56.1 kg (123 lb 10.9 oz), SpO2 100 %.   IV Fluids:  IV in place,KVo and [facility] Allergies:  Nsaids  Past Medical History:   has a past medical history of Gout; Renal insufficiency; Hypertension; and Hyperlipemia.  Past Surgical History:   has past surgical history that includes Tubal ligation and AV fistula placement.  Social History:   reports that she has never smoked. She has never used smokeless tobacco. She reports that she does not drink alcohol or use illicit drugs.  Patient/Family orientated to room. Information packet given to patient/family. Admission inpatient armband information verified with patient/family to include name and date of birth and placed on patient arm. Side rails up x 2, fall assessment and education completed with patient/family. Patient/family able to verbalize understanding of risk associated with falls and verbalized understanding to call for assistance before getting out of bed. Call light within reach. Patient/family able to voice and demonstrate understanding of unit orientation instructions.    Will continue to evaluate and treat per MD orders.

## 2014-06-07 DIAGNOSIS — I82402 Acute embolism and thrombosis of unspecified deep veins of left lower extremity: Secondary | ICD-10-CM

## 2014-06-07 LAB — RENAL FUNCTION PANEL
ALBUMIN: 2.1 g/dL — AB (ref 3.5–5.2)
Anion gap: 13 (ref 5–15)
BUN: 21 mg/dL (ref 6–23)
CHLORIDE: 97 meq/L (ref 96–112)
CO2: 25 mEq/L (ref 19–32)
CREATININE: 4.18 mg/dL — AB (ref 0.50–1.10)
Calcium: 8.3 mg/dL — ABNORMAL LOW (ref 8.4–10.5)
GFR calc Af Amer: 11 mL/min — ABNORMAL LOW (ref >90)
GFR, EST NON AFRICAN AMERICAN: 9 mL/min — AB (ref >90)
Glucose, Bld: 72 mg/dL (ref 70–99)
POTASSIUM: 3.8 meq/L (ref 3.7–5.3)
Phosphorus: 2.5 mg/dL (ref 2.3–4.6)
SODIUM: 135 meq/L — AB (ref 137–147)

## 2014-06-07 LAB — CBC
HCT: 27.5 % — ABNORMAL LOW (ref 36.0–46.0)
HEMATOCRIT: 29.3 % — AB (ref 36.0–46.0)
HEMOGLOBIN: 8.9 g/dL — AB (ref 12.0–15.0)
Hemoglobin: 8.5 g/dL — ABNORMAL LOW (ref 12.0–15.0)
MCH: 28.3 pg (ref 26.0–34.0)
MCH: 28.4 pg (ref 26.0–34.0)
MCHC: 30.4 g/dL (ref 30.0–36.0)
MCHC: 30.9 g/dL (ref 30.0–36.0)
MCV: 93.3 fL (ref 78.0–100.0)
MCV: 92 fL (ref 78.0–100.0)
PLATELETS: 181 10*3/uL (ref 150–400)
Platelets: 237 10*3/uL (ref 150–400)
RBC: 3.14 MIL/uL — ABNORMAL LOW (ref 3.87–5.11)
RBC: 2.99 MIL/uL — ABNORMAL LOW (ref 3.87–5.11)
RDW: 15.2 % (ref 11.5–15.5)
RDW: 15.4 % (ref 11.5–15.5)
WBC: 6.6 10*3/uL (ref 4.0–10.5)
WBC: 7.4 10*3/uL (ref 4.0–10.5)

## 2014-06-07 LAB — HEPARIN LEVEL (UNFRACTIONATED)
Heparin Unfractionated: <0.1 IU/mL — ABNORMAL LOW (ref 0.30–0.70)
Heparin Unfractionated: 0.2 IU/mL — ABNORMAL LOW (ref 0.30–0.70)
Heparin Unfractionated: 0.33 IU/mL (ref 0.30–0.70)

## 2014-06-07 LAB — URINE CULTURE

## 2014-06-07 LAB — PROTIME-INR
INR: 1.11 (ref 0.00–1.49)
Prothrombin Time: 14.4 seconds (ref 11.6–15.2)

## 2014-06-07 MED ORDER — WARFARIN SODIUM 5 MG PO TABS
5.0000 mg | ORAL_TABLET | Freq: Once | ORAL | Status: AC
  Administered 2014-06-07: 5 mg via ORAL
  Filled 2014-06-07: qty 1

## 2014-06-07 MED ORDER — WARFARIN SODIUM 2.5 MG PO TABS
2.5000 mg | ORAL_TABLET | Freq: Once | ORAL | Status: DC
Start: 2014-06-07 — End: 2014-06-07
  Filled 2014-06-07: qty 1

## 2014-06-07 MED ORDER — HEPARIN BOLUS VIA INFUSION
800.0000 [IU] | Freq: Once | INTRAVENOUS | Status: AC
  Administered 2014-06-07: 800 [IU] via INTRAVENOUS
  Filled 2014-06-07: qty 800

## 2014-06-07 MED ORDER — WARFARIN - PHARMACIST DOSING INPATIENT
Freq: Every day | Status: DC
  Administered 2014-06-07: 1

## 2014-06-07 MED ORDER — MIDODRINE HCL 5 MG PO TABS
ORAL_TABLET | ORAL | Status: AC
  Administered 2014-06-07: 10 mg via ORAL
  Filled 2014-06-07: qty 2

## 2014-06-07 MED ORDER — PNEUMOCOCCAL VAC POLYVALENT 25 MCG/0.5ML IJ INJ
0.5000 mL | INJECTION | INTRAMUSCULAR | Status: DC
  Filled 2014-06-07: qty 0.5

## 2014-06-07 MED ORDER — DARBEPOETIN ALFA 40 MCG/0.4ML IJ SOSY
PREFILLED_SYRINGE | INTRAMUSCULAR | Status: AC
  Administered 2014-06-07: 40 ug via INTRAVENOUS
  Filled 2014-06-07: qty 0.4

## 2014-06-07 MED ORDER — HEPARIN BOLUS VIA INFUSION
1500.0000 [IU] | Freq: Once | INTRAVENOUS | Status: AC
  Administered 2014-06-07: 1500 [IU] via INTRAVENOUS
  Filled 2014-06-07: qty 1500

## 2014-06-07 NOTE — Progress Notes (Addendum)
PATIENT DETAILS Name: [name] Age: 80 y.o. Sex: female Date of Birth: 12/27/1932 Admit Date: 06/04/2014 Admitting Physician Eduard Clos, MD ZOX:WRUEAVW,UJWJX A, MD  Brief Summary: 80 year old female with PMHx stage V CKD who was recently started on HD, admitted on 06/05/14 after she presented to the ED with worsening confusion, reported hx of SOB and hypotension. She was apparently still taking her anti-hypertensive's inspite of them being discontinued. She was found to have a UTI. Further inpatient work up has revealed a DVT. BP much better with initiation of Midodrine.  Subjective: BP much better. Confused-but pleasantly   Assessment/Plan: Principal Problem:  Hypotension:suspect multifactorial-recently started on HD, Still taking BP meds when not supposed to and ?from UTI. Given IVF boluses and started on Midodrine. Now improved, BP stable. Echo neg for pericardial effusion. Cortisol ok.Continue Midodrine to 10 mg TID. Appreciate CCM and Renal assistance.   Active Problems:  Reported hx of Shortness of breath:not hypoxic, no SOB while hospitalized. Since D Dimer significantly elevated,  Doppler legs were done, which was positive for a DVT. No RV strain seen on Echo, even if patient has a PE (although doubt)-suspect very small, and obtaining a VQ Scan/CT Angio would not change management. Would plan 6 months of anticoagulation.   Left Lower Ext DVT: Started on IV Heparin, start coumadin per pharmacy. Rest as above.Per daughter, patient approximately a month ago had a gout flare and was non ambulatory for a week.Per daughter no hx of falls or GI bleeding in the past.   BJY:NWGNFAOZ Rocephin. Urine Culture positive for E Coli, sensitivity pending. Apart from encephalopathy-no other symptoms. Afebrile and without any leukocytosis.  Encephalopathy:suspect hx of mild dementia/cognitive dysfunction. CT head negative for acute abnormalities.Worsened by UTI, sundowning.  Renal doubts Dialysis Dysequilibrium Syndrome. Continue IV Abx, supportive care  Anemia:secondary to IVF dilution and anemia of CKD. No overt bleeding evident. Monitor, Erythroppoetin/IV Fe per renal  Reported hx of Diarrhea:none witnessed since hospitalization-will d/c C Diff PCR.  Anemia:secondary to CKD. Monitor periodically  HYQ:MVHQI hypotension-all antihypertensives on hold  Asthma:lungs clear on exam.   Disposition: Remain inpatient-PT eval to see if she needs to go SNF.   Antibiotics:  See below  Anti-infectives    Start     Dose/Rate Route Frequency Ordered Stop   06/05/14 2200  cefTRIAXone (ROCEPHIN) 1 g in dextrose 5 % 50 mL IVPB - Premix     1 g100 mL/hr over 30 Minutes Intravenous Every 24 hours 06/05/14 0225     06/05/14 0100  cefTRIAXone (ROCEPHIN) 1 g in dextrose 5 % 50 mL IVPB     1 g100 mL/hr over 30 Minutes Intravenous  Once 06/05/14 0053 06/05/14 0131      DVT Prophylaxis: Heparin gtt/coumadin  Code Status: Full code  Family Communication Pharmacist, hospital the phone  Procedures:  None  CONSULTS:  nephrology   PCCM  Time spent 40 minutes-which includes 50% of the time with face-to-face with patient/ family and coordinating care related to the above assessment and plan.  MEDICATIONS: Scheduled Meds: . albuterol  3 mL Inhalation QHS  . antiseptic oral rinse  7 mL Mouth Rinse BID  . calcitRIOL  0.5 mcg Oral Q T,Th,Sa-HD  . cefTRIAXone (ROCEPHIN)  IV  1 g Intravenous Q24H  . cinacalcet  30 mg Oral Q breakfast  . darbepoetin (ARANESP) injection - DIALYSIS  40 mcg Intravenous Q Sat-HD  . feeding supplement (NEPRO CARB STEADY)  237  mL Oral BID BM  . ferric gluconate (FERRLECIT/NULECIT) IV  125 mg Intravenous Q T,Th,Sa-HD  . midodrine  10 mg Oral TID WC  . multivitamin  1 tablet Oral QHS  . pneumococcal 23 valent vaccine  0.5 mL Intramuscular Tomorrow-1000  . sevelamer carbonate  800 mg Oral TID WC  . sodium chloride  3 mL  Intravenous Q12H   Continuous Infusions: . sodium chloride 10 mL/hr at 06/05/14 1900  . heparin 850 Units/hr (06/07/14 0453)   PRN Meds:.acetaminophen **OR** acetaminophen, HYDROcodone-acetaminophen, ondansetron **OR** ondansetron (ZOFRAN) IV   PHYSICAL EXAM: Vital signs in last 24 hours: Filed Vitals:   06/06/14 1933 06/06/14 2052 06/06/14 2222 06/07/14 0641  BP: 93/53  109/56 116/70  Pulse: 89  79 90  Temp:   98 F (36.7 C) 98.3 F (36.8 C)  TempSrc:   Oral   Resp:   16 18  Height:      Weight:    56.3 kg (124 lb 1.9 oz)  SpO2: 100% 100% 100% 100%    Weight change: 0.2 kg (7.1 oz) Filed Weights   06/05/14 0217 06/06/14 0433 06/07/14 0641  Weight: 55.6 kg (122 lb 9.2 oz) 56.1 kg (123 lb 10.9 oz) 56.3 kg (124 lb 1.9 oz)   Body mass index is 24.24 kg/(m^2).   Gen Exam: Awake,pleasant confused, with clear speech.  Actually follows some commands and answers some questions appropriately today Neck: Supple, No JVD.   Chest: B/L Clear.  No rales or rhonchi CVS: S1 S2 Regular, no murmurs.  Abdomen: soft, BS +, non tender, non distended.  Extremities: no edema, lower extremities warm to touch. Neurologic: Non Focal.   Skin: No Rash.   Wounds: N/A.    Intake/Output from previous day:  Intake/Output Summary (Last 24 hours) at 06/07/14 0803 Last data filed at 06/07/14 0644  Gross per 24 hour  Intake  336.8 ml  Output    800 ml  Net -463.2 ml     LAB RESULTS: CBC  Recent Labs Lab 06/04/14 2207 06/04/14 2313 06/05/14 0317 06/06/14 0225 06/07/14 0049  WBC 6.7  --  6.4 6.4 7.4  HGB 9.4* 10.9* 9.6* 8.1* 8.9*  HCT 30.3* 32.0* 30.3* 26.3* 29.3*  PLT 199  --  180 196 237  MCV 91.3  --  94.1 93.3 93.3  MCH 28.3  --  29.8 28.7 28.3  MCHC 31.0  --  31.7 30.8 30.4  RDW 15.0  --  14.7 15.4 15.4  LYMPHSABS  --   --  1.0  --   --   MONOABS  --   --  0.7  --   --   EOSABS  --   --  0.0  --   --   BASOSABS  --   --  0.0  --   --     Chemistries   Recent Labs Lab  06/04/14 2207 06/04/14 2313 06/05/14 0317 06/06/14 0225  NA 135* 133* 135* 135*  K 4.0 3.8 3.9 3.7  CL 96 95* 96 97  CO2 25  --  29 27  GLUCOSE 84 85 79 67*  BUN 5* <3* 7 13  CREATININE 1.66* 1.70* 1.87* 3.22*  CALCIUM 8.7  --  8.6 8.9    CBG:  Recent Labs Lab 06/06/14 0818  GLUCAP 76    GFR Estimated Creatinine Clearance: 11.1 mL/min (by C-G formula based on Cr of 3.22).  Coagulation profile No results for input(s): INR, PROTIME in the last 168 hours.  Cardiac Enzymes  Recent Labs Lab 06/05/14 0317  TROPONINI <0.30    Invalid input(s): POCBNP  Recent Labs  06/05/14 0317  DDIMER 7.97*   No results for input(s): HGBA1C in the last 72 hours. No results for input(s): CHOL, HDL, LDLCALC, TRIG, CHOLHDL, LDLDIRECT in the last 72 hours.  Recent Labs  06/05/14 1230  TSH 1.350   No results for input(s): VITAMINB12, FOLATE, FERRITIN, TIBC, IRON, RETICCTPCT in the last 72 hours. No results for input(s): LIPASE, AMYLASE in the last 72 hours.  Urine Studies No results for input(s): UHGB, CRYS in the last 72 hours.  Invalid input(s): UACOL, UAPR, USPG, UPH, UTP, UGL, UKET, UBIL, UNIT, UROB, ULEU, UEPI, UWBC, URBC, UBAC, CAST, UCOM, BILUA  MICROBIOLOGY: Recent Results (from the past 240 hour(s))  Urine culture     Status: None (Preliminary result)   Collection Time: 06/05/14 12:14 AM  Result Value Ref Range Status   Specimen Description URINE, CATHETERIZED  Final   Special Requests NONE  Final   Culture  Setup Time   Final    06/05/2014 05:41 Performed at [facility]   Final    30,000 COLONIES/ML Performed at [facility]    Culture   Final    ESCHERICHIA COLI Performed at [facility]    Report Status PENDING  Incomplete  MRSA PCR Screening     Status: None   Collection Time: 06/05/14  2:09 AM  Result Value Ref Range Status   MRSA by PCR NEGATIVE NEGATIVE Final    Comment:        The GeneXpert MRSA Assay  (FDA approved for NASAL specimens only), is one component of a comprehensive MRSA colonization surveillance program. It is not intended to diagnose MRSA infection nor to guide or monitor treatment for MRSA infections.     RADIOLOGY STUDIES/RESULTS: Dg Chest 2 View  05/10/2014   CLINICAL DATA:  Lower extremity swelling for several days, joint pain for 2 days. Chronic shortness of breath. Renal insufficiency.  EXAM: CHEST  2 VIEW  COMPARISON:  Chest radiograph February 26, 2008  FINDINGS: The cardiac silhouette appears moderately enlarged, unchanged. Tortuous calcified aorta. Mild chronic interstitial changes without pleural effusions or focal consolidation. No pneumothorax. Patient is osteopenic.  IMPRESSION: Stable cardiomegaly, no acute pulmonary process.   Electronically Signed   By: Awilda Metro   On: 05/10/2014 01:41   Ct Head Wo Contrast  06/05/2014   CLINICAL DATA:  Intermittent confusion associated with hypertension.  EXAM: CT HEAD WITHOUT CONTRAST  TECHNIQUE: Contiguous axial images were obtained from the base of the skull through the vertex without intravenous contrast.  COMPARISON:  None.  FINDINGS: Skull and Sinuses:Negative for fracture or destructive process. The mastoids, middle ears, and imaged paranasal sinuses are clear.  Orbits: No acute abnormality.  Brain: No evidence of acute abnormality, such as acute infarction, hemorrhage, hydrocephalus, or mass lesion/mass effect. There is generalized volume loss consistent with atrophy. Mild chronic small vessel disease with ischemic gliosis noted around the frontal horn of the left lateral ventricle.  IMPRESSION: 1. No acute intracranial disease. 2. Brain atrophy and mild chronic small vessel ischemia.   Electronically Signed   By: Tiburcio Pea M.D.   On: 06/05/2014 03:17   Dg Chest Port 1 View  06/04/2014   CLINICAL DATA:  Asthma and hypertension.  Shortness of breath  EXAM: PORTABLE CHEST - 1 VIEW  COMPARISON:  05/10/2014   FINDINGS: Normal heart size. No  pleural effusion or edema. Lung volumes are low. No airspace consolidation. Coarsened interstitial markings are identified bilaterally.  IMPRESSION: 1. Low lung volumes. 2. No acute findings noted.   Electronically Signed   By: Signa Kellaylor  Stroud M.D.   On: 06/04/2014 23:45    Jeoffrey MassedGHIMIRE,SHANKER, MD  [facility] Pager:[phone]  If 7PM-7AM, please contact night-coverage [email or web address] Password [email or web address] 06/07/2014, 8:03 AM   LOS: 3 days

## 2014-06-07 NOTE — Progress Notes (Signed)
Hemodialysis- Spoke with pharmacy regarding heparin gtt. Order to bolus 800units and then increase rate to 9.5. Done in dialysis.

## 2014-06-07 NOTE — Plan of Care (Signed)
Problem: Discharge Progression Outcomes Goal: Pain controlled with appropriate interventions Outcome: Progressing     

## 2014-06-07 NOTE — Procedures (Signed)
I have personally attended this patient's dialysis session.   AVF cannulated without issues. 4K bath. No volume off with treatment today  Camille Balynthia Ladarrian Asencio, MD [facility] [phone] Pager 06/07/2014, 11:45 AM

## 2014-06-07 NOTE — Progress Notes (Addendum)
ANTICOAGULATION CONSULT NOTE - Follow Up Consult  Pharmacy Consult for heparin and warfarin Indication: new LLE DVT  Allergies  Allergen Reactions  . Nsaids     REACTION: Avoid NSAIDS(renal failure)    Patient Measurements: Height: 5' (152.4 cm) Weight: 122 lb 9.2 oz (55.6 kg) IBW/kg (Calculated) : 45.5  Vital Signs: Temp: 97 F (36.1 C) (11/28 1051) Temp Source: Oral (11/28 1051) BP: 102/62 mmHg (11/28 1200) Pulse Rate: 71 (11/28 1200)  Labs:  Recent Labs  06/05/14 0317 06/06/14 0225 06/07/14 0049 06/07/14 0924 06/07/14 1107 06/07/14 1201  HGB 9.6* 8.1* 8.9*  --  8.5*  --   HCT 30.3* 26.3* 29.3*  --  27.5*  --   PLT 180 196 237  --  181  --   LABPROT  --   --   --  14.4  --   --   INR  --   --   --  1.11  --   --   HEPARINUNFRC  --   --  <0.10*  --   --  0.20*  CREATININE 1.87* 3.22*  --   --  4.18*  --   TROPONINI <0.30  --   --   --   --   --     Estimated Creatinine Clearance: 8.5 mL/min (by C-G formula based on Cr of 4.18).   Assessment: 80 y/o female who was recently started on HD found to have an acute LLE DVT. She continues on IV heparin bridge to warfarin. Heparin level is SUBtherapeutic at 0.2. INR is 1.11 at baseline which is normal. No bleeding noted, Hb is low at 8.5 but fairly stable, platelets are normal.  Today is day 1 of IV heparin to warfarin. Bridge to continue a minimum of 5 days AND until INR >= 2 for 24 hours.  Goal of Therapy:  INR 2-3 Heparin level 0.3-0.7 units/ml Monitor platelets by anticoagulation protocol: Yes   Plan:  - Heparin 800 units IV bolus then increase to 950 units/hr - 8 hr heparin level (needs to be 4 hrs post HD) - Warfarin 5 mg PO tonight - Daily heparin level, CBC, and INR - Monitor for bleeding  [facility] LLCJennifer Bat[place], Pharm.D., BCPS Clinical Pharmacist Pager: [phone] 06/07/2014 12:36 PM    Addendum: Heparin level is therapeutic on the low end of normal at 0.33 on 950 units/hr. No bleeding noted.  Increase  heparin drip to 1000 units/hr to keep heparin level >0.3 Heparin level in am  [facility] CenterJennifer [place], ChupaderoPharm.D., BCPS Clinical Pharmacist Pager: [phone] 06/07/2014 10:04 PM

## 2014-06-07 NOTE — Progress Notes (Signed)
ANTICOAGULATION CONSULT NOTE - Follow Up Consult  Pharmacy Consult for Heparin  Indication: DVT, new onset, LLE  Allergies  Allergen Reactions  . Nsaids     REACTION: Avoid NSAIDS(renal failure)    Patient Measurements: Height: 5' (152.4 cm) Weight: 123 lb 10.9 oz (56.1 kg) IBW/kg (Calculated) : 45.5  Vital Signs: Temp: 98 F (36.7 C) (11/27 2222) Temp Source: Oral (11/27 2222) BP: 109/56 mmHg (11/27 2222) Pulse Rate: 79 (11/27 2222)  Labs:  Recent Labs  06/04/14 2313 06/05/14 0317 06/06/14 0225 06/07/14 0049  HGB 10.9* 9.6* 8.1* 8.9*  HCT 32.0* 30.3* 26.3* 29.3*  PLT  --  180 196 237  HEPARINUNFRC  --   --   --  <0.10*  CREATININE 1.70* 1.87* 3.22*  --   TROPONINI  --  <0.30  --   --     Estimated Creatinine Clearance: 11.1 mL/min (by C-G formula based on Cr of 3.22).   Assessment: Sub-therapeutic heparin level x 1, Hgb 8.9, Scr increasing, no issues per RN.   Goal of Therapy:  Heparin level 0.3-0.7 units/ml Monitor platelets by anticoagulation protocol: Yes   Plan:  -Heparin 1500 units BOLUS -Increase heparin drip to 850 units/hr -1230 HL -Daily CBC/HL -Monitor for bleeding  Abran DukeLedford, Rylan Kaufmann 06/07/2014,4:33 AM

## 2014-06-07 NOTE — Progress Notes (Signed)
[facility] Progress Note  Assessment/Plan: 1. Hypotension- Per outpt HD records has been hypotensive since starting HD (walked in for her very first treatment with low BP and despite being advised to stop, kept taking BP meds so midodrine was not started outpt but has been started since hospital admission). Off BP meds now. Cortisol 16.9. TSH 1.35 normal. ECHO 11/26 EF not mentioned but does state "no wall motion abnormalities" and no valvular issues, in particular aortic valve OK. Midodrine now at 10 mg TID. BP's still soft but trending up some. Review of outpt office notes prior to admission show chronic low BP prior to starting HD 2. UTI- rocephin per primary. Culture + for 30 KEColi with pending sensitivities 3. Diarrhea- Cdiff ordered but no stool sent (no diarrhea)- precautions discontinued 4. AMS- per outpt HD notes pt has been confused. Head CT with no acute findings. Seems more like a dementia with decompensation to me. No family here. I think she needs 24/7 supervision.I think underlying dementia 5. LLE DVT - d dimer elevated had SOB-  Chest xray clear. sats 100% on RA - to start on heparin and coumadin per pharmacy- should only be on this if good family supervision; need to arrange for PCP [facility] Contras( [name]) to follow after d/c 6. ESRD - TTS [facility]. Next HD today. K+3.7 4K+bath. No volume off (AT EDW of 56 at this time) 7. Anemia - Substantial drop in Hb  to 9.6 to  8.1 up to 8.9 today Fe bolus started for tsat 19 (11/19). Starting Aranesp with Saturday HD. Check stools. 8. Metabolic bone disease - Corr Ca 10.4 11/27 - repeat renal today - may need higher dose of sensipar pth 839 11/19- cont renvela, sensipar and calcitriol    9.       Nutrition - Alb 2.1 renal diet. renal vit- intake poor - didn't eat any breakfast - change to regular diet and add nepro 10.      Preventative health - received pneumococcal 23 valent vaccine - need to notify HD center at d/c   Sheffield SliderMartha B  Bergman, PA-C [facility] Beeper [phone] 06/07/2014,10:07 AM  LOS: 3 days   I have seen and examined this patient and agree with plan and assessment in the note of MBergman PA. Pt on dialysis at the present time. Anticoagulation now for DVT. Remains confused and I think very typical for underlying dementia.  No real etiology found for low BP's. On midodrine. Zorina Mallin B,MD 06/07/2014 11:40 AM  Subjective:   "I haven't seen you for a long time." Cannot tell me where she is. Though [facility] [place]  Tells me she lives in her own crib, but her daughter can stay and help out. No appetite, no pain. Remembers seeing me from yesterday and the day before but doesn't know who I am - "you do some kinda job here"  Objective Filed Vitals:   06/06/14 1933 06/06/14 2052 06/06/14 2222 06/07/14 0641  BP: 93/53  109/56 116/70  Pulse: 89  79 90  Temp:   98 F (36.7 C) 98.3 F (36.8 C)  TempSrc:   Oral   Resp:   16 18  Height:      Weight:    56.3 kg (124 lb 1.9 oz)  SpO2: 100% 100% 100% 100%   Physical Exam General: NAD in bed, though clearly confused Heart: RRR Lungs: no rales Abdomen: soft NT Extremities: no LE edema Dialysis Access:  Left upper  AVF +bruit- quite large given new to HD has hematoma and old bruising s/p prior infiltration  Dialysis Orders: TTS [facility] 4hr 56kgs 4K/2.25Ca 5700u Heparin L AVF 400/800  Calcitriol 0.645mcg No Aranesp started this admission 40 mcg QSat Venofer 100mg s x6 stop 12/10   Additional Objective Labs: Basic Metabolic Panel:  Recent Labs Lab 06/04/14 2207 06/04/14 2313 06/05/14 0317 06/06/14 0225  NA 135* 133* 135* 135*  K 4.0 3.8 3.9 3.7  CL 96 95* 96 97  CO2 25  --  29 27  GLUCOSE 84 85 79 67*  BUN 5* <3* 7 13  CREATININE 1.66* 1.70* 1.87* 3.22*  CALCIUM 8.7  --  8.6 8.9   Liver Function Tests:  Recent Labs Lab 06/04/14 2207 06/05/14 0317  AST 17 16  ALT 9 8  ALKPHOS 60 55   BILITOT 0.6 0.4  PROT 6.2 5.8*  ALBUMIN 2.2* 2.1*   CBC:  Recent Labs Lab 06/04/14 2207  06/05/14 0317 06/06/14 0225 06/07/14 0049  WBC 6.7  --  6.4 6.4 7.4  NEUTROABS  --   --  4.7  --   --   HGB 9.4*  < > 9.6* 8.1* 8.9*  HCT 30.3*  < > 30.3* 26.3* 29.3*  MCV 91.3  --  94.1 93.3 93.3  PLT 199  --  180 196 237  < > = values in this interval not displayed. Blood Culture    Component Value Date/Time   SDES URINE, CATHETERIZED 06/05/2014 0014   SPECREQUEST NONE 06/05/2014 0014   CULT  06/05/2014 0014    ESCHERICHIA COLI Performed at [facility]    REPTSTATUS PENDING 06/05/2014 0014    Cardiac Enzymes:  Recent Labs Lab 06/05/14 0317  TROPONINI <0.30   CBG:  Recent Labs Lab 06/06/14 0818  GLUCAP 76  Medications: . sodium chloride 10 mL/hr at 06/05/14 1900  . heparin 850 Units/hr (06/07/14 0453)   . albuterol  3 mL Inhalation QHS  . antiseptic oral rinse  7 mL Mouth Rinse BID  . calcitRIOL  0.5 mcg Oral Q T,Th,Sa-HD  . cefTRIAXone (ROCEPHIN)  IV  1 g Intravenous Q24H  . cinacalcet  30 mg Oral Q breakfast  . darbepoetin (ARANESP) injection - DIALYSIS  40 mcg Intravenous Q Sat-HD  . feeding supplement (NEPRO CARB STEADY)  237 mL Oral BID BM  . ferric gluconate (FERRLECIT/NULECIT) IV  125 mg Intravenous Q T,Th,Sa-HD  . midodrine  10 mg Oral TID WC  . multivitamin  1 tablet Oral QHS  . pneumococcal 23 valent vaccine  0.5 mL Intramuscular Tomorrow-1000  . sevelamer carbonate  800 mg Oral TID WC  . sodium chloride  3 mL Intravenous Q12H

## 2014-06-08 DIAGNOSIS — I952 Hypotension due to drugs: Secondary | ICD-10-CM

## 2014-06-08 LAB — HEPARIN LEVEL (UNFRACTIONATED)
HEPARIN UNFRACTIONATED: 0.5 [IU]/mL (ref 0.30–0.70)
Heparin Unfractionated: 0.32 IU/mL (ref 0.30–0.70)

## 2014-06-08 LAB — PROTIME-INR
INR: 1.29 (ref 0.00–1.49)
Prothrombin Time: 16.2 seconds — ABNORMAL HIGH (ref 11.6–15.2)

## 2014-06-08 LAB — CBC
HCT: 29 % — ABNORMAL LOW (ref 36.0–46.0)
Hemoglobin: 8.6 g/dL — ABNORMAL LOW (ref 12.0–15.0)
MCH: 28 pg (ref 26.0–34.0)
MCHC: 29.7 g/dL — ABNORMAL LOW (ref 30.0–36.0)
MCV: 94.5 fL (ref 78.0–100.0)
PLATELETS: 234 10*3/uL (ref 150–400)
RBC: 3.07 MIL/uL — ABNORMAL LOW (ref 3.87–5.11)
RDW: 15.5 % (ref 11.5–15.5)
WBC: 7.5 10*3/uL (ref 4.0–10.5)

## 2014-06-08 MED ORDER — COUMADIN BOOK
Freq: Once | Status: AC
  Administered 2014-06-08: 1
  Filled 2014-06-08: qty 1

## 2014-06-08 MED ORDER — WARFARIN VIDEO
Freq: Once | Status: AC
  Administered 2014-06-08: 1

## 2014-06-08 MED ORDER — LORAZEPAM 2 MG/ML IJ SOLN: 0.5000 mg | Freq: Four times a day (QID) | INTRAMUSCULAR | Status: DC | PRN

## 2014-06-08 MED ORDER — WARFARIN SODIUM 5 MG PO TABS
5.0000 mg | ORAL_TABLET | Freq: Once | ORAL | Status: AC
  Administered 2014-06-08: 5 mg via ORAL
  Filled 2014-06-08: qty 1

## 2014-06-08 NOTE — Progress Notes (Signed)
[facility] Progress Note   Subjective:    Eating breakfast Quite pleasant but no idea what day it is Today knows this is a hospital "I've seen you before - you come check on us"  Objective Filed Vitals:   06/07/14 1459 06/07/14 2145 06/07/14 2212 06/08/14 0542  BP: 94/65  101/58 95/55  Pulse: 82  86 80  Temp: 97.1 F (36.2 C)  98.4 F (36.9 C) 97.8 F (36.6 C)  TempSrc: Oral  Oral Oral  Resp: 20  18 18   Height:      Weight: 55.6 kg (122 lb 9.2 oz)   55.6 kg (122 lb 9.2 oz)  SpO2: 99% 100% 100% 100%   Physical Exam General: NAD Slight older BF. Animated, pleasant, but obviously confused if questioned about specifics of location, date, time, and even situation  Heart: Regular S1S2 No S3 Lungs: no rales Abdomen: soft NT Extremities: no LE edema Dialysis Access:  Left upper AVF +bruit- quite large given new to HD has hematoma and old bruising s/p prior infiltration. Patent.    Additional Objective Labs: No new labs today Basic Metabolic Panel:  Recent Labs Lab 06/05/14 0317 06/06/14 0225 06/07/14 1107  NA 135* 135* 135*  K 3.9 3.7 3.8  CL 96 97 97  CO2 29 27 25   GLUCOSE 79 67* 72  BUN 7 13 21   CREATININE 1.87* 3.22* 4.18*  CALCIUM 8.6 8.9 8.3*  PHOS  --   --  2.5     Recent Labs Lab 06/04/14 2207 06/05/14 0317 06/07/14 1107  AST 17 16  --   ALT 9 8  --   ALKPHOS 60 55  --   BILITOT 0.6 0.4  --   PROT 6.2 5.8*  --   ALBUMIN 2.2* 2.1* 2.1*   CBC:  Recent Labs Lab 06/05/14 0317 06/06/14 0225 06/07/14 0049 06/07/14 1107 06/08/14 0415  WBC 6.4 6.4 7.4 6.6 7.5  NEUTROABS 4.7  --   --   --   --   HGB 9.6* 8.1* 8.9* 8.5* 8.6*  HCT 30.3* 26.3* 29.3* 27.5* 29.0*  MCV 94.1 93.3 93.3 92.0 94.5  PLT 180 196 237 181 234   Blood Culture    Component Value Date/Time   SDES URINE, CATHETERIZED 06/05/2014 0014   SPECREQUEST NONE 06/05/2014 0014   CULT  06/05/2014 0014    ESCHERICHIA COLI Performed at [facility]    REPTSTATUS 06/07/2014 FINAL 06/05/2014 0014   Recent Labs Lab 06/05/14 0317  TROPONINI <0.30   CBG:  Recent Labs Lab 06/06/14 0818  GLUCAP 76    Medications: . sodium chloride 10 mL/hr at 06/05/14 1900  . heparin 1,000 Units/hr (06/07/14 2204)   . albuterol  3 mL Inhalation QHS  . antiseptic oral rinse  7 mL Mouth Rinse BID  . calcitRIOL  0.5 mcg Oral Q T,Th,Sa-HD  . cefTRIAXone (ROCEPHIN)  IV  1 g Intravenous Q24H  . cinacalcet  30 mg Oral Q breakfast  . darbepoetin (ARANESP) injection - DIALYSIS  40 mcg Intravenous Q Sat-HD  . feeding supplement (NEPRO CARB STEADY)  237 mL Oral BID BM  . ferric gluconate (FERRLECIT/NULECIT) IV  125 mg Intravenous Q T,Th,Sa-HD  . midodrine  10 mg Oral TID WC  . multivitamin  1 tablet Oral QHS  . pneumococcal 23 valent vaccine  0.5 mL Intramuscular Tomorrow-1000  . pneumococcal 23 valent vaccine  0.5 mL Intramuscular Tomorrow-1000  . sevelamer carbonate  800 mg Oral TID WC  .  sodium chloride  3 mL Intravenous Q12H  . Warfarin - Pharmacist Dosing Inpatient   Does not apply q1800    Dialysis Orders: TTS [facility] 4hr 56kgs 4K/2.25Ca 5700u Heparin L AVF 400/800  Calcitriol 0.565mcg No Aranesp started this admission 40 mcg QSat Venofer 100mg s x6 stop 12/10   Assessment/Plan: 1. Hypotension - CHRONIC - Review of outpt office notes prior to admission show chronic low BP prior to starting HD AND per outpt HD records has been hypotensive since starting HD (walked in for her very first treatment with low BP 90's and despite being advised to stop, kept taking BP meds so midodrine was not started outpt but has been started since hospital admission). Off BP meds now. Cortisol 16.9. TSH 1.35 normal. ECHO 11/26 EF not mentioned but does state "no wall motion abnormalities" and no valvular issues, in particular aortic valve OK. Midodrine now at 10 mg TID. BP's still soft but pt asymptomatic and tolerated HD yesterday (no volume was removed).   2. UTI- rocephin per primary. Culture + for 30 KEColi 3. Diarrhea- Cdiff ordered but no stool sent (no diarrhea)- precautions discontinued 4. AMS- probable underlying dementia. Head CT with no acute findings. Seems more like a dementia with decompensation to me. Needs 24/7 supervision. 5. LLE DVT - d dimer elevated (done d/t pt c/o SOB) Chest xray clear. sats 100% on RA. Duplex + RLE DVT. Heparin/coumadin started per pharmacy.  Should only be on this if good family supervision; need to arrange for PCP [facility]( [name]) to follow after d/c 6. ESRD - TTS [facility].  4K+bath. EDW 56. Next HD on Tuesday. 7. Anemia - Substantial drop in Hb  to 9.6 to  8.1 up to 8.6 yesterday. Fe bolus started for tsat 19 (11/19). Started Aranesp 40 with 11/28 HD. Check stools. 8. Metabolic bone disease -  pth 839 11/19- cont renvela, sensipar and calcitriol    9.       Nutrition - Alb 2.1 renal diet. renal vit- intake poor - didn't eat any breakfast - change to regular diet and add nepro 10.      Preventative health - received pneumococcal 23 valent vaccine - need to notify HD center at d/c   [name] Balynthia Latona Krichbaum, MD [facility] [phone] Pager 06/08/2014, 9:11 AM

## 2014-06-08 NOTE — Progress Notes (Addendum)
ANTICOAGULATION CONSULT NOTE - Follow Up Consult  Pharmacy Consult for heparin and warfarin Indication: new LLE DVT  Allergies  Allergen Reactions  . Nsaids     REACTION: Avoid NSAIDS(renal failure)    Patient Measurements: Height: 5' (152.4 cm) Weight: 122 lb 9.2 oz (55.6 kg) IBW/kg (Calculated) : 45.5  Vital Signs: Temp: 97.8 F (36.6 C) (11/29 0542) Temp Source: Oral (11/29 0542) BP: 95/55 mmHg (11/29 0542) Pulse Rate: 80 (11/29 0542)  Labs:  Recent Labs  06/06/14 0225  06/07/14 0049 06/07/14 0924 06/07/14 1107 06/07/14 1201 06/07/14 2112 06/08/14 0415  HGB 8.1*  --  8.9*  --  8.5*  --   --  8.6*  HCT 26.3*  --  29.3*  --  27.5*  --   --  29.0*  PLT 196  --  237  --  181  --   --  234  LABPROT  --   --   --  14.4  --   --   --  16.2*  INR  --   --   --  1.11  --   --   --  1.29  HEPARINUNFRC  --   < > <0.10*  --   --  0.20* 0.33 0.32  CREATININE 3.22*  --   --   --  4.18*  --   --   --   < > = values in this interval not displayed.  Estimated Creatinine Clearance: 8.5 mL/min (by C-G formula based on Cr of 4.18).   Assessment: 80 y/o female who was recently started on HD found to have an acute LLE DVT. She continues on IV heparin bridge to warfarin. Heparin level is therapeutic at 0.32 but on the lower end. INR is 1.11 at baseline which is normal.  INR today is SUBtherapeutic at 1.29.  No bleeding noted, Hb is low at 8.6 but fairly stable, platelets are normal.  Patient gets HD TTS.    Today is day 2 of IV heparin to warfarin. Bridge to continue a minimum of 5 days AND until INR >= 2 for 24 hours.  Goal of Therapy:  INR 2-3 Heparin level 0.3-0.7 units/ml Monitor platelets by anticoagulation protocol: Yes   Plan:  - Increase Heparin to 1050 units/hr - Warfarin 5 mg PO x 1 tonight - F/U heparin level, CBC, and INR - Monitor for bleeding  Red ChristiansSamson Lee, Pharm. D. Clinical Pharmacy Resident Pager: [phone] Ph: [phone] 06/08/2014 12:43  PM   Addendum: Heparin level is therapeutic at 0.5 on 1050 units/hr. No bleeding noted. Continue this rate. Will f/u in am.  Loura BackJennifer Haskell, Pharm.D., BCPS Clinical Pharmacist Pager: [phone] 06/08/2014 9:35 PM

## 2014-06-08 NOTE — Progress Notes (Signed)
Patient is complaining of shortness of breath and is breathing irregularly.  More disoriented than earlier today.  Vitals taken and within range.BP-90/55, HR - 98, O2- 100 with 2L O2, R-16,

## 2014-06-08 NOTE — Plan of Care (Signed)
Problem: Discharge Progression Outcomes Goal: Discharge plan in place and appropriate Outcome: Progressing     

## 2014-06-08 NOTE — Progress Notes (Signed)
PATIENT DETAILS Name: [name] Age: [age] y.o. Sex: female Date of Birth: [birth year] Admit Date: 06/04/2014 Admitting Physician Eduard ClosArshad N Kakrakandy, MD NWG:NFAOZHY,QMVHQPCP:[name],[name], MD  Brief Summary: 80 year old female with PMHx stage V CKD who was recently started on HD, admitted on 06/05/14 after she presented to the ED with worsening confusion, reported hx of SOB and hypotension. She was apparently still taking her anti-hypertensive's inspite of them being discontinued. She was found to have a UTI. Further inpatient work up has revealed a left leg DVT. BP much better with initiation of Midodrine.  Subjective: Sleepy. Confused. No complaints.   Assessment/Plan: Principal Problem:  Hypotension:suspect multifactorial-recently started on HD, Still taking BP meds when not supposed to and ?from UTI. Given IVF boluses and started on Midodrine. Now improved, BP stable. Echo neg for pericardial effusion. Cortisol ok.Continue Midodrine to 10 mg TID. Appreciate CCM and Renal assistance. No volume removed with dialysis yesterday.   Active Problems:  Reported hx of Shortness of breath:not hypoxic, no SOB while hospitalized. Since D Dimer significantly elevated,  Doppler legs were done, which was positive for a DVT. No RV strain seen on Echo, even if patient has a PE (although doubt)-suspect very small, and obtaining a VQ Scan/CT Angio would not change management. Plan 6 months of anticoagulation.   Left Lower Ext DVT: Started on IV Heparin, start coumadin per pharmacy. Rest as above.Per daughter, patient approximately a month ago had a gout flare and was non ambulatory for a week.Per daughter no hx of falls or GI bleeding in the past.   UTI: Urine Culture positive for E Coli sensitive to Rocephin which she has been receiving- has received 3 days- will d/c today. Apart from encephalopathy-no other symptoms. Afebrile and without any leukocytosis.  Encephalopathy:suspect hx of mild  dementia/cognitive dysfunction. CT head negative for acute abnormalities.Worsened by UTI, sundowning. Renal doubts Dialysis Dysequilibrium Syndrome.   Anemia:secondary to IVF dilution and anemia of CKD. No overt bleeding evident. Monitor, Erythroppoetin/IV Fe per renal  Reported hx of Diarrhea:none witnessed since hospitalization-will d/c C Diff PCR.  Asthma:lungs clear on exam.   Disposition: Remain inpatient-PT eval pending    Antibiotics:  See below  Anti-infectives    Start     Dose/Rate Route Frequency Ordered Stop   06/05/14 2200  cefTRIAXone (ROCEPHIN) 1 g in dextrose 5 % 50 mL IVPB - Premix     1 g100 mL/hr over 30 Minutes Intravenous Every 24 hours 06/05/14 0225     06/05/14 0100  cefTRIAXone (ROCEPHIN) 1 g in dextrose 5 % 50 mL IVPB     1 g100 mL/hr over 30 Minutes Intravenous  Once 06/05/14 0053 06/05/14 0131      DVT Prophylaxis: Heparin gtt/coumadin  Code Status: Full code  Family Communication   Procedures:  None  CONSULTS:  nephrology   PCCM  Time spent 30 minutes  MEDICATIONS: Scheduled Meds: . albuterol  3 mL Inhalation QHS  . antiseptic oral rinse  7 mL Mouth Rinse BID  . calcitRIOL  0.5 mcg Oral Q T,Th,Sa-HD  . cefTRIAXone (ROCEPHIN)  IV  1 g Intravenous Q24H  . cinacalcet  30 mg Oral Q breakfast  . darbepoetin (ARANESP) injection - DIALYSIS  40 mcg Intravenous Q Sat-HD  . feeding supplement (NEPRO CARB STEADY)  237 mL Oral BID BM  . ferric gluconate (FERRLECIT/NULECIT) IV  125 mg Intravenous Q T,Th,Sa-HD  . midodrine  10 mg Oral TID WC  .  multivitamin  1 tablet Oral QHS  . pneumococcal 23 valent vaccine  0.5 mL Intramuscular Tomorrow-1000  . pneumococcal 23 valent vaccine  0.5 mL Intramuscular Tomorrow-1000  . sevelamer carbonate  800 mg Oral TID WC  . sodium chloride  3 mL Intravenous Q12H  . Warfarin - Pharmacist Dosing Inpatient   Does not apply q1800   Continuous Infusions: . sodium chloride 10 mL/hr at 06/05/14 1900  . heparin  1,000 Units/hr (06/07/14 2204)   PRN Meds:.acetaminophen **OR** acetaminophen, HYDROcodone-acetaminophen, ondansetron **OR** ondansetron (ZOFRAN) IV   PHYSICAL EXAM: Vital signs in last 24 hours: Filed Vitals:   06/07/14 1459 06/07/14 2145 06/07/14 2212 06/08/14 0542  BP: 94/65  101/58 95/55  Pulse: 82  86 80  Temp: 97.1 F (36.2 C)  98.4 F (36.9 C) 97.8 F (36.6 C)  TempSrc: Oral  Oral Oral  Resp: 20  18 18   Height:      Weight: 55.6 kg (122 lb 9.2 oz)   55.6 kg (122 lb 9.2 oz)  SpO2: 99% 100% 100% 100%    Weight change: -0.7 kg (-1 lb 8.7 oz) Filed Weights   06/07/14 1051 06/07/14 1459 06/08/14 0542  Weight: 55.6 kg (122 lb 9.2 oz) 55.6 kg (122 lb 9.2 oz) 55.6 kg (122 lb 9.2 oz)   Body mass index is 23.94 kg/(m^2).   Gen Exam: Awake,pleasant confused, with clear speech.  Neck: Supple, No JVD.   Chest: B/L Clear.  No rales or rhonchi CVS: S1 S2 Regular, no murmurs.  Abdomen: soft, BS +, non tender, non distended.  Extremities: no edema, lower extremities warm to touch. Neurologic: Non Focal.   Skin: No Rash.   Wounds: N/A.    Intake/Output from previous day:  Intake/Output Summary (Last 24 hours) at 06/08/14 1223 Last data filed at 06/07/14 1758  Gross per 24 hour  Intake      0 ml  Output    249 ml  Net   -249 ml     LAB RESULTS: CBC  Recent Labs Lab 06/05/14 0317 06/06/14 0225 06/07/14 0049 06/07/14 1107 06/08/14 0415  WBC 6.4 6.4 7.4 6.6 7.5  HGB 9.6* 8.1* 8.9* 8.5* 8.6*  HCT 30.3* 26.3* 29.3* 27.5* 29.0*  PLT 180 196 237 181 234  MCV 94.1 93.3 93.3 92.0 94.5  MCH 29.8 28.7 28.3 28.4 28.0  MCHC 31.7 30.8 30.4 30.9 29.7*  RDW 14.7 15.4 15.4 15.2 15.5  LYMPHSABS 1.0  --   --   --   --   MONOABS 0.7  --   --   --   --   EOSABS 0.0  --   --   --   --   BASOSABS 0.0  --   --   --   --     Chemistries   Recent Labs Lab 06/04/14 2207 06/04/14 2313 06/05/14 0317 06/06/14 0225 06/07/14 1107  NA 135* 133* 135* 135* 135*  K 4.0 3.8 3.9 3.7  3.8  CL 96 95* 96 97 97  CO2 25  --  29 27 25   GLUCOSE 84 85 79 67* 72  BUN 5* <3* 7 13 21   CREATININE 1.66* 1.70* 1.87* 3.22* 4.18*  CALCIUM 8.7  --  8.6 8.9 8.3*    CBG:  Recent Labs Lab 06/06/14 0818  GLUCAP 76    GFR Estimated Creatinine Clearance: 8.5 mL/min (by C-G formula based on Cr of 4.18).  Coagulation profile  Recent Labs Lab 06/07/14 0924 06/08/14 0415  INR 1.11  1.29    Cardiac Enzymes  Recent Labs Lab 06/05/14 0317  TROPONINI <0.30    Invalid input(s): POCBNP No results for input(s): DDIMER in the last 72 hours. No results for input(s): HGBA1C in the last 72 hours. No results for input(s): CHOL, HDL, LDLCALC, TRIG, CHOLHDL, LDLDIRECT in the last 72 hours.  Recent Labs  06/05/14 1230  TSH 1.350   No results for input(s): VITAMINB12, FOLATE, FERRITIN, TIBC, IRON, RETICCTPCT in the last 72 hours. No results for input(s): LIPASE, AMYLASE in the last 72 hours.  Urine Studies No results for input(s): UHGB, CRYS in the last 72 hours.  Invalid input(s): UACOL, UAPR, USPG, UPH, UTP, UGL, UKET, UBIL, UNIT, UROB, ULEU, UEPI, UWBC, URBC, UBAC, CAST, UCOM, BILUA  MICROBIOLOGY: Recent Results (from the past 240 hour(s))  Urine culture     Status: None   Collection Time: 06/05/14 12:14 AM  Result Value Ref Range Status   Specimen Description URINE, CATHETERIZED  Final   Special Requests NONE  Final   Culture  Setup Time   Final    06/05/2014 05:41 Performed at [facility] Count   Final    30,000 COLONIES/ML Performed at [facility]    Culture   Final    ESCHERICHIA COLI Performed at [facility]    Report Status 06/07/2014 FINAL  Final   Organism ID, Bacteria ESCHERICHIA COLI  Final      Susceptibility   Escherichia coli - MIC*    AMPICILLIN 16 INTERMEDIATE Intermediate     CEFAZOLIN <=4 SENSITIVE Sensitive     CEFTRIAXONE <=1 SENSITIVE Sensitive     CIPROFLOXACIN <=0.25 SENSITIVE Sensitive      GENTAMICIN <=1 SENSITIVE Sensitive     LEVOFLOXACIN <=0.12 SENSITIVE Sensitive     NITROFURANTOIN <=16 SENSITIVE Sensitive     TOBRAMYCIN <=1 SENSITIVE Sensitive     TRIMETH/SULFA <=20 SENSITIVE Sensitive     PIP/TAZO <=4 SENSITIVE Sensitive     * ESCHERICHIA COLI  MRSA PCR Screening     Status: None   Collection Time: 06/05/14  2:09 AM  Result Value Ref Range Status   MRSA by PCR NEGATIVE NEGATIVE Final    Comment:        The GeneXpert MRSA Assay (FDA approved for NASAL specimens only), is one component of a comprehensive MRSA colonization surveillance program. It is not intended to diagnose MRSA infection nor to guide or monitor treatment for MRSA infections.     RADIOLOGY STUDIES/RESULTS: Dg Chest 2 View  05/10/2014   CLINICAL DATA:  Lower extremity swelling for several days, joint pain for 2 days. Chronic shortness of breath. Renal insufficiency.  EXAM: CHEST  2 VIEW  COMPARISON:  Chest radiograph February 26, 2008  FINDINGS: The cardiac silhouette appears moderately enlarged, unchanged. Tortuous calcified aorta. Mild chronic interstitial changes without pleural effusions or focal consolidation. No pneumothorax. Patient is osteopenic.  IMPRESSION: Stable cardiomegaly, no acute pulmonary process.   Electronically Signed   By: Awilda Metro   On: 05/10/2014 01:41   Ct Head Wo Contrast  06/05/2014   CLINICAL DATA:  Intermittent confusion associated with hypertension.  EXAM: CT HEAD WITHOUT CONTRAST  TECHNIQUE: Contiguous axial images were obtained from the base of the skull through the vertex without intravenous contrast.  COMPARISON:  None.  FINDINGS: Skull and Sinuses:Negative for fracture or destructive process. The mastoids, middle ears, and imaged paranasal sinuses are clear.  Orbits: No acute abnormality.  Brain: No evidence of  acute abnormality, such as acute infarction, hemorrhage, hydrocephalus, or mass lesion/mass effect. There is generalized volume loss consistent with  atrophy. Mild chronic small vessel disease with ischemic gliosis noted around the frontal horn of the left lateral ventricle.  IMPRESSION: 1. No acute intracranial disease. 2. Brain atrophy and mild chronic small vessel ischemia.   Electronically Signed   By: Tiburcio PeaJonathan  Watts M.D.   On: 06/05/2014 03:17   Dg Chest Port 1 View  06/04/2014   CLINICAL DATA:  Asthma and hypertension.  Shortness of breath  EXAM: PORTABLE CHEST - 1 VIEW  COMPARISON:  05/10/2014  FINDINGS: Normal heart size. No pleural effusion or edema. Lung volumes are low. No airspace consolidation. Coarsened interstitial markings are identified bilaterally.  IMPRESSION: 1. Low lung volumes. 2. No acute findings noted.   Electronically Signed   By: Signa Kellaylor  Stroud M.D.   On: 06/04/2014 23:45    Calvert CantorIZWAN,Yedidya Duddy, MD  [facility]  If 7PM-7AM, please contact night-coverage [email or web address] Password [email or web address] 06/08/2014, 12:23 PM   LOS: 4 days

## 2014-06-09 DIAGNOSIS — T82898A Other specified complication of vascular prosthetic devices, implants and grafts, initial encounter: Secondary | ICD-10-CM

## 2014-06-09 DIAGNOSIS — I82409 Acute embolism and thrombosis of unspecified deep veins of unspecified lower extremity: Secondary | ICD-10-CM | POA: Diagnosis present

## 2014-06-09 DIAGNOSIS — F411 Generalized anxiety disorder: Secondary | ICD-10-CM

## 2014-06-09 LAB — RENAL FUNCTION PANEL
Albumin: 1.9 g/dL — ABNORMAL LOW (ref 3.5–5.2)
Anion gap: 14 (ref 5–15)
BUN: 13 mg/dL (ref 6–23)
CHLORIDE: 100 meq/L (ref 96–112)
CO2: 24 meq/L (ref 19–32)
CREATININE: 3.44 mg/dL — AB (ref 0.50–1.10)
Calcium: 9 mg/dL (ref 8.4–10.5)
GFR calc Af Amer: 14 mL/min — ABNORMAL LOW (ref >90)
GFR calc non Af Amer: 12 mL/min — ABNORMAL LOW (ref >90)
Glucose, Bld: 66 mg/dL — ABNORMAL LOW (ref 70–99)
Phosphorus: 2.9 mg/dL (ref 2.3–4.6)
Potassium: 4.1 mEq/L (ref 3.7–5.3)
Sodium: 138 mEq/L (ref 137–147)

## 2014-06-09 LAB — CBC
HEMATOCRIT: 27.3 % — AB (ref 36.0–46.0)
HEMOGLOBIN: 8.3 g/dL — AB (ref 12.0–15.0)
MCH: 29.2 pg (ref 26.0–34.0)
MCHC: 30.4 g/dL (ref 30.0–36.0)
MCV: 96.1 fL (ref 78.0–100.0)
Platelets: 239 10*3/uL (ref 150–400)
RBC: 2.84 MIL/uL — ABNORMAL LOW (ref 3.87–5.11)
RDW: 15.5 % (ref 11.5–15.5)
WBC: 8.2 10*3/uL (ref 4.0–10.5)

## 2014-06-09 LAB — HEPARIN LEVEL (UNFRACTIONATED): Heparin Unfractionated: 0.22 IU/mL — ABNORMAL LOW (ref 0.30–0.70)

## 2014-06-09 LAB — PROTIME-INR
INR: 1.45 (ref 0.00–1.49)
Prothrombin Time: 17.7 seconds — ABNORMAL HIGH (ref 11.6–15.2)

## 2014-06-09 MED ORDER — CINACALCET HCL 30 MG PO TABS: 30.0000 mg | ORAL_TABLET | Freq: Every day | ORAL | Status: DC

## 2014-06-09 MED ORDER — HEPARIN BOLUS VIA INFUSION
1500.0000 [IU] | Freq: Once | INTRAVENOUS | Status: AC
  Administered 2014-06-09: 1500 [IU] via INTRAVENOUS
  Filled 2014-06-09: qty 1500

## 2014-06-09 MED ORDER — NEPRO/CARBSTEADY PO LIQD: 237.0000 mL | Freq: Two times a day (BID) | ORAL | Status: DC

## 2014-06-09 MED ORDER — LORAZEPAM 0.5 MG PO TABS: 0.5000 mg | ORAL_TABLET | Freq: Two times a day (BID) | ORAL | Status: DC | PRN

## 2014-06-09 MED ORDER — SEVELAMER CARBONATE 800 MG PO TABS: 800.0000 mg | ORAL_TABLET | Freq: Three times a day (TID) | ORAL | Status: DC

## 2014-06-09 MED ORDER — WARFARIN SODIUM 5 MG PO TABS: 5.0000 mg | ORAL_TABLET | Freq: Every day | ORAL | Status: DC

## 2014-06-09 MED ORDER — ENOXAPARIN SODIUM 60 MG/0.6ML ~~LOC~~ SOLN
55.0000 mg | SUBCUTANEOUS | Status: DC
  Administered 2014-06-09: 55 mg via SUBCUTANEOUS
  Filled 2014-06-09 (×2): qty 0.6

## 2014-06-09 MED ORDER — WARFARIN SODIUM 5 MG PO TABS
5.0000 mg | ORAL_TABLET | Freq: Once | ORAL | Status: DC
  Filled 2014-06-09: qty 1

## 2014-06-09 MED ORDER — ENOXAPARIN SODIUM 60 MG/0.6ML ~~LOC~~ SOLN: 55.0000 mg | SUBCUTANEOUS | Status: DC

## 2014-06-09 MED ORDER — MIDODRINE HCL 10 MG PO TABS: 10.0000 mg | ORAL_TABLET | Freq: Three times a day (TID) | ORAL | Status: DC

## 2014-06-09 NOTE — Evaluation (Signed)
Physical Therapy Evaluation Patient Details Name: [name] MRN: [id number] DOB: August 02, 1932 Today's Date: 06/09/2014   History of Present Illness  80 year old female with PMHx stage V CKD who was recently started on HD, admitted on 06/05/14 after she presented to the ED with worsening confusion, reported hx of SOB and hypotension. She was apparently still taking her anti-hypertensive's inspite of them being discontinued. She was found to have a UTI. Further inpatient work up has revealed a left leg DVT.   Clinical Impression  Pt admitted with the above complications. Pt currently with functional limitations due to the deficits listed below (see PT Problem List). Ambulates with min guard for safety while using a rolling walker up to 65 feet today. Tolerated therapeutic exercises well. Vitals stable throughout therapy session. Daughter present and states she is arranging for patient to have 24 hour care at home. Would greatly benefit from continued PT at home with HHPT.   Follow Up Recommendations Home health PT;Supervision/Assistance - 24 hour    Equipment Recommendations  Rolling walker with 5" wheels;3in1 (PT) (Tub bench)    Recommendations for Other Services       Precautions / Restrictions Precautions Precautions: Fall Restrictions Weight Bearing Restrictions: No      Mobility  Bed Mobility Overal bed mobility: Needs Assistance Bed Mobility: Supine to Sit;Sit to Supine     Supine to sit: Supervision;HOB elevated Sit to supine: Supervision   General bed mobility comments: Supervision for safety, cues for technique, no assist needed.  Transfers Overall transfer level: Needs assistance Equipment used: Rolling walker (2 wheeled) Transfers: Sit to/from Stand Sit to Stand: Supervision         General transfer comment: Supervision for safety. VC for hand placement. Performed from lowest bed setting. Did not require physical assist.  Ambulation/Gait Ambulation/Gait  assistance: Min guard Ambulation Distance (Feet): 65 Feet Assistive device: Rolling walker (2 wheeled) Gait Pattern/deviations: Step-through pattern;Decreased stride length;Drifts right/left;Trunk flexed   Gait velocity interpretation: Below normal speed for age/gender General Gait Details: Pt educated on safe DME use with a rolling walker for stability. VC for walker placement and upright posture with forward gaze. No loss of balance or instances of knees buckling during bout. HR 80s and SpO2 >92%.  Stairs            Wheelchair Mobility    Modified Rankin (Stroke Patients Only)       Balance Overall balance assessment: Needs assistance Sitting-balance support: No upper extremity supported;Feet supported Sitting balance-Leahy Scale: Good     Standing balance support: No upper extremity supported Standing balance-Leahy Scale: Fair                               Pertinent Vitals/Pain Pain Assessment: No/denies pain    Home Living Family/patient expects to be discharged to:: Private residence Living Arrangements: Children Available Help at Discharge: Family;Available 24 hours/day (Family is arranging 24hr care between 2 sisters) Type of Home: House Home Access: Stairs to enter Entrance Stairs-Rails: Doctor, general practiceight;Left Entrance Stairs-Number of Steps: 2 Home Layout: One level Home Equipment: Walker - standard;Cane - quad      Prior Function Level of Independence: Needs assistance   Gait / Transfers Assistance Needed: hand held assist with cane  ADL's / Homemaking Assistance Needed: needs assist with bath/dress        Hand Dominance   Dominant Hand: Right    Extremity/Trunk Assessment   Upper Extremity Assessment:  Defer to OT evaluation           Lower Extremity Assessment: Generalized weakness         Communication   Communication: No difficulties  Cognition Arousal/Alertness: Awake/alert Behavior During Therapy: WFL for tasks  assessed/performed Overall Cognitive Status: Within Functional Limits for tasks assessed                      General Comments      Exercises General Exercises - Lower Extremity Ankle Circles/Pumps: AROM;Both;10 reps;Seated Gluteal Sets: Strengthening;Both;10 reps;Supine Long Arc Quad: Strengthening;Both;10 reps;Seated Hip Flexion/Marching: Strengthening;Both;10 reps;Seated      Assessment/Plan    PT Assessment Patient needs continued PT services  PT Diagnosis Generalized weakness;Abnormality of gait;Difficulty walking   PT Problem List Decreased strength;Decreased activity tolerance;Decreased balance;Decreased mobility;Decreased knowledge of use of DME  PT Treatment Interventions DME instruction;Gait training;Stair training;Functional mobility training;Therapeutic activities;Therapeutic exercise;Balance training;Neuromuscular re-education;Patient/family education   PT Goals (Current goals can be found in the Care Plan section) Acute Rehab PT Goals Patient Stated Goal: Go home PT Goal Formulation: With patient/family Time For Goal Achievement: 06/23/14 Potential to Achieve Goals: Good    Frequency Min 3X/week   Barriers to discharge        Co-evaluation               End of Session Equipment Utilized During Treatment: Gait belt Activity Tolerance: Patient tolerated treatment well Patient left: in bed;with call bell/phone within reach;with bed alarm set;with family/visitor present Nurse Communication: Mobility status         Time: [id number] PT Time Calculation (min) (ACUTE ONLY): 21 min   Charges:   PT Evaluation $Initial PT Evaluation Tier I: 1 Procedure PT Treatments $Gait Training: 8-22 mins   PT G CodesBerton Mount:          Rakhi Romagnoli S 06/09/2014, 10:33 AM  Charlsie MerlesLogan Secor Estil Vallee, PT [phone]

## 2014-06-09 NOTE — Discharge Instructions (Signed)

## 2014-06-09 NOTE — Progress Notes (Signed)
Nsg Discharge Note  Admit Date:  06/04/2014 Discharge date: 06/09/2014   [name] to be D/C'd Home with home health per MD order.  AVS completed.  Copy for chart, and copy for patient signed, and dated. Patient/caregiver able to verbalize understanding.  Discharge Medication:   Medication List    STOP taking these medications        amLODipine 5 MG tablet  Commonly known as:  NORVASC     aspirin EC 81 MG tablet     cilostazol 50 MG tablet  Commonly known as:  PLETAL     furosemide 20 MG tablet  Commonly known as:  LASIX     metoprolol succinate 25 MG 24 hr tablet  Commonly known as:  TOPROL-XL     predniSONE 20 MG tablet  Commonly known as:  DELTASONE      TAKE these medications        cinacalcet 30 MG tablet  Commonly known as:  SENSIPAR  Take 1 tablet (30 mg total) by mouth daily with breakfast.     enoxaparin 60 MG/0.6ML injection  Commonly known as:  LOVENOX  Inject 0.55 mLs (55 mg total) into the skin daily.     feeding supplement (NEPRO CARB STEADY) Liqd  Take 237 mLs by mouth 2 (two) times daily between meals.     HYDROcodone-acetaminophen 5-325 MG per tablet  Commonly known as:  NORCO/VICODIN  Take 1-2 tablets by mouth every 4 (four) hours as needed for moderate pain or severe pain.     LORazepam 0.5 MG tablet  Commonly known as:  ATIVAN  Take 1 tablet (0.5 mg total) by mouth every 12 (twelve) hours as needed for anxiety.     midodrine 10 MG tablet  Commonly known as:  PROAMATINE  Take 1 tablet (10 mg total) by mouth 3 (three) times daily with meals.     multivitamin Tabs tablet  Take 1 tablet by mouth daily.     nitroGLYCERIN 0.4 MG SL tablet  Commonly known as:  NITROSTAT  Place 0.4 mg under the tongue every 5 (five) minutes as needed for chest pain.     PROAIR HFA 108 (90 BASE) MCG/ACT inhaler  Generic drug:  albuterol  Inhale 1 puff into the lungs at bedtime.     sevelamer carbonate 800 MG tablet  Commonly known as:  RENVELA   Take 1 tablet (800 mg total) by mouth 3 (three) times daily with meals.     warfarin 5 MG tablet  Commonly known as:  COUMADIN  Take 1 tablet (5 mg total) by mouth daily at 6 PM.        Discharge Assessment: Filed Vitals:   06/09/14 1315  BP: 108/63  Pulse: 81  Temp: 98 F (36.7 C)  Resp: 18   Skin clean, dry, may refer to documentation. Family instructed on repositioning patient and lovenox administration. IV catheter discontinued intact. Site without signs and symptoms of complications - no redness or edema noted at insertion site, patient denies c/o pain - only slight tenderness at site.  Dressing with slight pressure applied.  D/c Instructions-Education: Discharge instructions given to patient/family with verbalized understanding. D/c education completed with patient/family including follow up instructions, medication list, d/c activities limitations if indicated, with other d/c instructions as indicated by MD - patient able to verbalize understanding, all questions fully answered. Patient instructed to return to ED, call 911, or call MD for any changes in condition.  Patient escorted via WC, and D/C  home via private auto.  [name], [name] L, RN 06/09/2014 4:18 PM

## 2014-06-09 NOTE — Progress Notes (Addendum)
[facility] Progress Note   Subjective: no complaints, walked with walker and assistant today  Filed Vitals:   06/08/14 1607 06/08/14 2140 06/08/14 2224 06/09/14 0636  BP: 111/73 117/65  93/49  Pulse: 85 73  81  Temp: 98.3 F (36.8 C) 97.8 F (36.6 C)  98.3 F (36.8 C)  TempSrc: Oral Oral  Oral  Resp:  18  16  Height:      Weight:    55.6 kg (122 lb 9.2 oz)  SpO2: 100% 100% 100% 99%   Exam: Frail elderly AAF, not in distress No jvd Chest clear bilat Cor irreg rhythm, no MRG Abd soft, NTND L AVF with large area ecchymosis entire LUA , +sharp bruit but with no background hum Neuro bilat LE weakness, gen'd weakness, nonfocal, oriented to day of the week, "hospital", not sure of time/year  HD: TTS [facility] 4hr 56kgs 4K/2.25Ca 5700u Heparin L AVF 400/800  Calcitriol 0.495mcg No Aranesp started this admission 40 mcg QSat Venofer 100mg s x6 stop 12/10        Assessment: 1. Hypotension - chronic issue. Cort/TSH normal, ECHO ok. Now on midodrine 10 mg tid, BP better.   2. UTI EColi - on Rocephin 3. AMS - question underlying dementia, a little better today, CT neg.   4. L leg DVT - hep/coumadin started, will need good family supervision to be able to take this coumadin safely 5. ESRD on HD 6. Anemia on IV Fe, aranesp 40/wk 7. MBD - cont vit D, sensipar , binder 8. Nutrition - stable 9. Volume - below dry wt slightly 10. Preventative health - received pneumococcal 23 valent vaccine - need to notify HD center at d/c   Plan- HD tomorrow. Check into access history, has infiltration LUA and sharp bruit w/o background flow bruit, may indicate stenosis.     [name] [name] Brabant MD  pager [phone]    cell [phone]  06/09/2014, 11:05 AM     Recent Labs Lab 06/06/14 0225 06/07/14 1107 06/09/14 0440  NA 135* 135* 138  K 3.7 3.8 4.1  CL 97 97 100  CO2 27 25 24   GLUCOSE 67* 72 66*  BUN 13 21 13   CREATININE 3.22* 4.18* 3.44*  CALCIUM 8.9 8.3* 9.0   PHOS  --  2.5 2.9    Recent Labs Lab 06/04/14 2207 06/05/14 0317 06/07/14 1107 06/09/14 0440  AST 17 16  --   --   ALT 9 8  --   --   ALKPHOS 60 55  --   --   BILITOT 0.6 0.4  --   --   PROT 6.2 5.8*  --   --   ALBUMIN 2.2* 2.1* 2.1* 1.9*    Recent Labs Lab 06/05/14 0317  06/07/14 1107 06/08/14 0415 06/09/14 0440  WBC 6.4  < > 6.6 7.5 8.2  NEUTROABS 4.7  --   --   --   --   HGB 9.6*  < > 8.5* 8.6* 8.3*  HCT 30.3*  < > 27.5* 29.0* 27.3*  MCV 94.1  < > 92.0 94.5 96.1  PLT 180  < > 181 234 239  < > = values in this interval not displayed. Marland Kitchen. albuterol  3 mL Inhalation QHS  . antiseptic oral rinse  7 mL Mouth Rinse BID  . calcitRIOL  0.5 mcg Oral Q T,Th,Sa-HD  . cinacalcet  30 mg Oral Q breakfast  . darbepoetin (ARANESP) injection - DIALYSIS  40 mcg Intravenous Q Sat-HD  . enoxaparin (LOVENOX)  injection  55 mg Subcutaneous Q24H  . feeding supplement (NEPRO CARB STEADY)  237 mL Oral BID BM  . ferric gluconate (FERRLECIT/NULECIT) IV  125 mg Intravenous Q T,Th,Sa-HD  . midodrine  10 mg Oral TID WC  . multivitamin  1 tablet Oral QHS  . pneumococcal 23 valent vaccine  0.5 mL Intramuscular Tomorrow-1000  . sevelamer carbonate  800 mg Oral TID WC  . sodium chloride  3 mL Intravenous Q12H  . warfarin  5 mg Oral ONCE-1800  . Warfarin - Pharmacist Dosing Inpatient   Does not apply q1800   . sodium chloride 10 mL/hr at 06/05/14 1900  . heparin 1,200 Units/hr (06/09/14 0710)   acetaminophen **OR** acetaminophen, HYDROcodone-acetaminophen, LORazepam, ondansetron **OR** ondansetron (ZOFRAN) IV

## 2014-06-09 NOTE — Progress Notes (Signed)
ANTICOAGULATION CONSULT NOTE  Pharmacy Consult for heparin and warfarin Indication: DVT  Allergies  Allergen Reactions  . Nsaids     REACTION: Avoid NSAIDS(renal failure)    Patient Measurements: Height: 5' (152.4 cm) Weight: 122 lb 9.2 oz (55.6 kg) IBW/kg (Calculated) : 45.5  Vital Signs: Temp: 97.8 F (36.6 C) (11/29 2140) Temp Source: Oral (11/29 2140) BP: 117/65 mmHg (11/29 2140) Pulse Rate: 73 (11/29 2140)  Labs:  Recent Labs  06/07/14 0924 06/07/14 1107  06/08/14 0415 06/08/14 2100 06/09/14 0440  HGB  --  8.5*  --  8.6*  --  8.3*  HCT  --  27.5*  --  29.0*  --  27.3*  PLT  --  181  --  234  --  239  LABPROT 14.4  --   --  16.2*  --  17.7*  INR 1.11  --   --  1.29  --  1.45  HEPARINUNFRC  --   --   < > 0.32 0.50 0.22*  CREATININE  --  4.18*  --   --   --  3.44*  < > = values in this interval not displayed.  Estimated Creatinine Clearance: 10.4 mL/min (by C-G formula based on Cr of 3.44).   Assessment: 80 y/o female with new LLE DVT for anticoagulation  Today is day 3 of IV heparin to warfarin. Bridge to continue a minimum of 5 days AND until INR >= 2 for 24 hours.  Goal of Therapy:  INR 2-3 Heparin level 0.3-0.7 units/ml Monitor platelets by anticoagulation protocol: Yes   Plan:  Heparin 1500 units IV bolus, then increase heparin 1200 units/hr Check heparin level in 8 hours. Coumadin 5 mg today  Geannie RisenGreg Axcel Horsch, PharmD, BCPS

## 2014-06-09 NOTE — Progress Notes (Signed)
Pt alert to self, no c/o pain, pt on heparin gtt @ 10.465ml/hr,pt resting comfortably, bed alarm on, pt stable

## 2014-06-09 NOTE — Progress Notes (Signed)
ANTICOAGULATION CONSULT NOTE - Follow Up Consult  Pharmacy Consult for Lovenox Indication: DVT  Allergies  Allergen Reactions  . Nsaids     REACTION: Avoid NSAIDS(renal failure)    Patient Measurements: Height: 5' (152.4 cm) Weight: 122 lb 9.2 oz (55.6 kg) IBW/kg (Calculated) : 45.5 Heparin Dosing Weight:   Vital Signs: Temp: 98.3 F (36.8 C) (11/30 0636) Temp Source: Oral (11/30 0636) BP: 93/49 mmHg (11/30 0636) Pulse Rate: 81 (11/30 0636)  Labs:  Recent Labs  06/07/14 0924 06/07/14 1107  06/08/14 0415 06/08/14 2100 06/09/14 0440  HGB  --  8.5*  --  8.6*  --  8.3*  HCT  --  27.5*  --  29.0*  --  27.3*  PLT  --  181  --  234  --  239  LABPROT 14.4  --   --  16.2*  --  17.7*  INR 1.11  --   --  1.29  --  1.45  HEPARINUNFRC  --   --   < > 0.32 0.50 0.22*  CREATININE  --  4.18*  --   --   --  3.44*  < > = values in this interval not displayed.  Estimated Creatinine Clearance: 10.4 mL/min (by C-G formula based on Cr of 3.44).   Medications:  Scheduled:  . albuterol  3 mL Inhalation QHS  . antiseptic oral rinse  7 mL Mouth Rinse BID  . calcitRIOL  0.5 mcg Oral Q T,Th,Sa-HD  . cinacalcet  30 mg Oral Q breakfast  . darbepoetin (ARANESP) injection - DIALYSIS  40 mcg Intravenous Q Sat-HD  . enoxaparin (LOVENOX) injection  55 mg Subcutaneous Q24H  . feeding supplement (NEPRO CARB STEADY)  237 mL Oral BID BM  . ferric gluconate (FERRLECIT/NULECIT) IV  125 mg Intravenous Q T,Th,Sa-HD  . midodrine  10 mg Oral TID WC  . multivitamin  1 tablet Oral QHS  . pneumococcal 23 valent vaccine  0.5 mL Intramuscular Tomorrow-1000  . sevelamer carbonate  800 mg Oral TID WC  . sodium chloride  3 mL Intravenous Q12H  . warfarin  5 mg Oral ONCE-1800  . Warfarin - Pharmacist Dosing Inpatient   Does not apply q1800    Assessment: 80yo female with ESRD on day#3/5 minimum overlap of heparin/Coumadin, now to transition to Lovenox to complete bridging.  INR 1.45 this AM.  She will  require a minimum of 3 days of Lovenox to complete 24hr overlap with therapeutic INR.  Renal has OK'd use of Lovenox.  Goal of Therapy:  Bridging to Coumadin Monitor platelets by anticoagulation protocol: Yes   Plan:  Stop Heparin now Lovenox 55mg  SQ q24, start 1hr after Heparin stopped D/C 2PM Heparin level Change CBC to q72 Watch for s/s of bleeding.  Marisue HumbleKendra Dequavious Harshberger, PharmD Clinical Pharmacist [facility]

## 2014-06-09 NOTE — Discharge Summary (Signed)
Physician Discharge Summary  [name] DouglasMattie M Kant JYN:[id number] DOB: Jun 10, 1933 DOA: 06/04/2014  PCP: Dorrene GermanAVBUERE,[name], MD  Admit date: 06/04/2014 Discharge date: 06/09/2014  Time spent: 60 minutes  Recommendations for Outpatient Follow-up:  1. Home Health RN for Lovenox injections and Medication management (Coumadin). 2. Home health PT  3. Follow up scheduled with Dr. Concepcion ElkAvbuere for INR check on Jun 11, 2014.  Discharge Diagnoses:  Principal Problem:   SOB (shortness of breath) Active Problems:   Arterial hypotension   DVT (deep venous thrombosis)   ESRD on dialysis   Hypotension   UTI (lower urinary tract infection)   Diarrhea   Dementia with behavioral disturbance   Anxiety state   Discharge Condition: stable.  Diet recommendation: renal with coumadin restrictions  Filed Weights   06/07/14 1459 06/08/14 0542 06/09/14 0636  Weight: 55.6 kg (122 lb 9.2 oz) 55.6 kg (122 lb 9.2 oz) 55.6 kg (122 lb 9.2 oz)    History of present illness at the time of admission:  [name] is a 80 y.o. female with history of ESRD on hemodialysis, hypertension and asthma who was brought to the ER complaining of shortness of breath and confusion.  The patient's family reports that the patient has been confused off and on for the last 1 week. Patient was just recently started on hemodialysis and has had 3 dialysis sessions.  In the ER patient was found to be hypotensive but not febrile. UA showed features consistent with UTI. Patient was alert and awake - appeared mildly confused but followed commands well.   Hospital Course:  Hypotension:  Multifactorial.  [name] was recently started on HD, and was still taking BP meds that had been previously discontinued.  Also she was found to have a UTI.  She was treated with IVF boluses and started on Midodrine. BP now improved. Echo neg for pericardial effusion. Cortisol ok. Will will discharge her on Midodrine 10 mg TID.   Active Problems: Reported  hx of Shortness of breath/ anxiety -was not hypoxic on admission - dyspnea may have been due to anxiety vs small PE-  -  Since D Dimer was significantly elevated,venous doppler of her legs was completed, which was positive for a DVT. No RV strain seen on Echo.  If the patient has a PE (although doubt), suspect it would be very small, and obtaining a VQ Scan/CT Angio would not change management.   - Patient's feeling of SOB is also likely partly due to anxiety.  She had an anxiety attack on the evening of 11/29 with complaints of chest pain in difficultly breathing- pulse ox was 100%- she was given 0.5 mg of Ativan and symptoms resolved completely.  - She will discharge home on dose ativan PRN.Marland Kitchen.  Left Lower Ext DVT: Seen on Lower extremity doppler ultrasound.  Patient was started on IV Heparin and coumadin per pharmacy. Per daughter, patient approximately a month ago had a gout flare and was non ambulatory for a week. Per daughter no hx of falls or GI bleeding in the past. The patient will be discharged on 5 mg daily of warfarin and a lovenox bridge.  She will have a home health RN to help with the lovenox and coumadin administration.  She will see Dr. Concepcion ElkAvbuere on 12/2 for an INR.  UTI: Urine Culture positive for E Coli sensitive to Rocephin.  She has received 3 days of IV antibiotics. Apart from encephalopathy-no other symptoms. Afebrile and without any leukocytosis.  Encephalopathy/ dementia: suspect hx  of mild dementia/cognitive dysfunction. CT head negative for acute abnormalities.  Worsened by UTI, sundowning. Renal doubts Dialysis Dysequilibrium Syndrome.   Anemia:  secondary to IVF dilution and anemia of CKD. No overt bleeding evident. Stable for on-going outpatient Erythroppoetin/IV Fe per nephrology.  Reported hx of Diarrhea: none witnessed during her hospitalization.  Asthma: lungs clear on exam.    Procedures:  HD Venous Dopplers:  Bilateral lower extremity venous duplex  completed.  The right lower extremity is negative for deep vein thrombosis. The left lower extremity is positive for acute deep vein thrombosis involving the left saphenofemoral junction, common femoral, and profunda femoral veins. There is evidence of a small left Baker's cyst.  Consultations:  Nephrology  Discharge Exam:  Filed Vitals:   06/08/14 2140 06/08/14 2224 06/09/14 0636 06/09/14 1315  BP: 117/65  93/49 108/63  Pulse: 73  81 81  Temp: 97.8 F (36.6 C)  98.3 F (36.8 C) 98 F (36.7 C)  TempSrc: Oral  Oral Oral  Resp: 18  16 18   Height:      Weight:   55.6 kg (122 lb 9.2 oz)   SpO2: 100% 100% 99% 100%    Gen Exam: Awake,pleasant confused, with clear speech. Dtr at bedside. Neck: Supple, No JVD.  Chest: B/L Clear. No rales or rhonchi, no increased work of breathing. CVS: S1 S2 Regular, no murmurs.  Abdomen: thin, soft, BS +, non tender, non distended.  Extremities: no edema, lower extremities warm to touch. Neurologic:  Non Focal.     Discharge Instructions   Discharge Instructions    Diet general    Complete by:  As directed   RENAL DIET, COUMADIN RESTRICTIONS     Increase activity slowly    Complete by:  As directed           Current Discharge Medication List    START taking these medications   Details  cinacalcet (SENSIPAR) 30 MG tablet Take 1 tablet (30 mg total) by mouth daily with breakfast. Qty: 30 tablet, Refills: 3    enoxaparin (LOVENOX) 60 MG/0.6ML injection Inject 0.55 mLs (55 mg total) into the skin daily. Qty: 5 Syringe, Refills: 0    LORazepam (ATIVAN) 0.5 MG tablet Take 1 tablet (0.5 mg total) by mouth every 12 (twelve) hours as needed for anxiety. Qty: 30 tablet, Refills: 0    midodrine (PROAMATINE) 10 MG tablet Take 1 tablet (10 mg total) by mouth 3 (three) times daily with meals. Qty: 90 tablet, Refills: 3    Nutritional Supplements (FEEDING SUPPLEMENT, NEPRO CARB STEADY,) LIQD Take 237 mLs by mouth 2 (two) times daily  between meals. Qty: 60 Can, Refills: 3    sevelamer carbonate (RENVELA) 800 MG tablet Take 1 tablet (800 mg total) by mouth 3 (three) times daily with meals. Qty: 90 tablet, Refills: 3    warfarin (COUMADIN) 5 MG tablet Take 1 tablet (5 mg total) by mouth daily at 6 PM. Qty: 30 tablet, Refills: 3      CONTINUE these medications which have NOT CHANGED   Details  multivitamin (RENA-VIT) TABS tablet Take 1 tablet by mouth daily.    nitroGLYCERIN (NITROSTAT) 0.4 MG SL tablet Place 0.4 mg under the tongue every 5 (five) minutes as needed for chest pain.    albuterol (PROAIR HFA) 108 (90 BASE) MCG/ACT inhaler Inhale 1 puff into the lungs at bedtime.    HYDROcodone-acetaminophen (NORCO/VICODIN) 5-325 MG per tablet Take 1-2 tablets by mouth every 4 (four) hours as needed for moderate  pain or severe pain. Qty: 30 tablet, Refills: 0      STOP taking these medications     cilostazol (PLETAL) 50 MG tablet      amLODipine (NORVASC) 5 MG tablet      aspirin EC 81 MG tablet      furosemide (LASIX) 20 MG tablet      metoprolol succinate (TOPROL-XL) 25 MG 24 hr tablet      predniSONE (DELTASONE) 20 MG tablet        Allergies  Allergen Reactions  . Nsaids     REACTION: Avoid NSAIDS(renal failure)   Follow-up Information    Please follow up.   Why:  Hemodialysis as regularly scheduled.      Follow up with Dorrene German, MD On 06/11/2014.   Specialty:  Internal Medicine   Why:  9:30 am with Dr. Bary Leriche information:   [address] [place] [address] [phone]       Follow up with [facility].   Why:  HHRN, HHPT   Contact information:   [address] [place] [address] [phone]        The results of significant diagnostics from this hospitalization (including imaging, microbiology, ancillary and laboratory) are listed below for reference.    Significant Diagnostic Studies: Ct Head Wo Contrast  06/05/2014    CLINICAL DATA:  Intermittent confusion associated with hypertension.  EXAM: CT HEAD WITHOUT CONTRAST  TECHNIQUE: Contiguous axial images were obtained from the base of the skull through the vertex without intravenous contrast.  COMPARISON:  None.  FINDINGS: Skull and Sinuses:Negative for fracture or destructive process. The mastoids, middle ears, and imaged paranasal sinuses are clear.  Orbits: No acute abnormality.  Brain: No evidence of acute abnormality, such as acute infarction, hemorrhage, hydrocephalus, or mass lesion/mass effect. There is generalized volume loss consistent with atrophy. Mild chronic small vessel disease with ischemic gliosis noted around the frontal horn of the left lateral ventricle.  IMPRESSION: 1. No acute intracranial disease. 2. Brain atrophy and mild chronic small vessel ischemia.   Electronically Signed   By: Tiburcio Pea M.D.   On: 06/05/2014 03:17   Dg Chest Port 1 View  06/04/2014   CLINICAL DATA:  Asthma and hypertension.  Shortness of breath  EXAM: PORTABLE CHEST - 1 VIEW  COMPARISON:  05/10/2014  FINDINGS: Normal heart size. No pleural effusion or edema. Lung volumes are low. No airspace consolidation. Coarsened interstitial markings are identified bilaterally.  IMPRESSION: 1. Low lung volumes. 2. No acute findings noted.   Electronically Signed   By: Signa Kell M.D.   On: 06/04/2014 23:45    Microbiology: Recent Results (from the past 240 hour(s))  Urine culture     Status: None   Collection Time: 06/05/14 12:14 AM  Result Value Ref Range Status   Specimen Description URINE, CATHETERIZED  Final   Special Requests NONE  Final   Culture  Setup Time   Final    06/05/2014 05:41 Performed at [facility] Count   Final    30,000 COLONIES/ML Performed at [facility]    Culture   Final    ESCHERICHIA COLI Performed at [facility]    Report Status 06/07/2014 FINAL  Final   Organism ID, Bacteria ESCHERICHIA COLI  Final       Susceptibility   Escherichia coli - MIC*    AMPICILLIN 16 INTERMEDIATE Intermediate     CEFAZOLIN <=4  SENSITIVE Sensitive     CEFTRIAXONE <=1 SENSITIVE Sensitive     CIPROFLOXACIN <=0.25 SENSITIVE Sensitive     GENTAMICIN <=1 SENSITIVE Sensitive     LEVOFLOXACIN <=0.12 SENSITIVE Sensitive     NITROFURANTOIN <=16 SENSITIVE Sensitive     TOBRAMYCIN <=1 SENSITIVE Sensitive     TRIMETH/SULFA <=20 SENSITIVE Sensitive     PIP/TAZO <=4 SENSITIVE Sensitive     * ESCHERICHIA COLI  MRSA PCR Screening     Status: None   Collection Time: 06/05/14  2:09 AM  Result Value Ref Range Status   MRSA by PCR NEGATIVE NEGATIVE Final    Comment:        The GeneXpert MRSA Assay (FDA approved for NASAL specimens only), is one component of a comprehensive MRSA colonization surveillance program. It is not intended to diagnose MRSA infection nor to guide or monitor treatment for MRSA infections.      Labs: Basic Metabolic Panel:  Recent Labs Lab 06/04/14 2207 06/04/14 2313 06/05/14 0317 06/06/14 0225 06/07/14 1107 06/09/14 0440  NA 135* 133* 135* 135* 135* 138  K 4.0 3.8 3.9 3.7 3.8 4.1  CL 96 95* 96 97 97 100  CO2 25  --  29 27 25 24   GLUCOSE 84 85 79 67* 72 66*  BUN 5* <3* 7 13 21 13   CREATININE 1.66* 1.70* 1.87* 3.22* 4.18* 3.44*  CALCIUM 8.7  --  8.6 8.9 8.3* 9.0  PHOS  --   --   --   --  2.5 2.9   Liver Function Tests:  Recent Labs Lab 06/04/14 2207 06/05/14 0317 06/07/14 1107 06/09/14 0440  AST 17 16  --   --   ALT 9 8  --   --   ALKPHOS 60 55  --   --   BILITOT 0.6 0.4  --   --   PROT 6.2 5.8*  --   --   ALBUMIN 2.2* 2.1* 2.1* 1.9*    Recent Labs Lab 06/05/14 0317  AMMONIA 13   CBC:  Recent Labs Lab 06/05/14 0317 06/06/14 0225 06/07/14 0049 06/07/14 1107 06/08/14 0415 06/09/14 0440  WBC 6.4 6.4 7.4 6.6 7.5 8.2  NEUTROABS 4.7  --   --   --   --   --   HGB 9.6* 8.1* 8.9* 8.5* 8.6* 8.3*  HCT 30.3* 26.3* 29.3* 27.5* 29.0* 27.3*  MCV 94.1 93.3 93.3  92.0 94.5 96.1  PLT 180 196 237 181 234 239   Cardiac Enzymes:  Recent Labs Lab 06/05/14 0317  TROPONINI <0.30   CBG:  Recent Labs Lab 06/06/14 0818  GLUCAP 76       Signed:  Conley Canal  [facility] 06/09/2014, 2:24 PM   I have examined the patient, reviewed the chart and modified the above note which I agree with.   Calvert Cantor, MD

## 2014-06-09 NOTE — Consult Note (Signed)
[facility] [place] CONSULT NOTE   MRN : [id number]  Reason for Consult: evaluation of left upper arm AV fistula Referring Physician: Dr. Arlean HoppingSchertz   History of Present Illness: 80 y/o female who is on HD with questionable infiltration ecchymosis.  She had successful HD yesterday.  Past medical history includes: hypertension, hyperlipidemia and ESRD.   She was admitted secondary to shortness of breath.  She is a poor historian.  The fistula was created by Dr. Arbie CookeyEarly in 2008.     Current Facility-Administered Medications  Medication Dose Route Frequency Provider Last Rate Last Dose  . 0.9 %  sodium chloride infusion   Intravenous Continuous Maretta BeesShanker M Ghimire, MD 10 mL/hr at 06/05/14 1900    . acetaminophen (TYLENOL) tablet 650 mg  650 mg Oral Q6H PRN Eduard ClosArshad N Kakrakandy, MD       Or  . acetaminophen (TYLENOL) suppository 650 mg  650 mg Rectal Q6H PRN Eduard ClosArshad N Kakrakandy, MD      . albuterol (PROVENTIL) (2.5 MG/3ML) 0.083% nebulizer solution 3 mL  3 mL Inhalation QHS Eduard ClosArshad N Kakrakandy, MD   3 mL at 06/08/14 2224  . antiseptic oral rinse (CPC / CETYLPYRIDINIUM CHLORIDE 0.05%) solution 7 mL  7 mL Mouth Rinse BID Eduard ClosArshad N Kakrakandy, MD   7 mL at 06/09/14 1000  . calcitRIOL (ROCALTROL) capsule 0.5 mcg  0.5 mcg Oral Q T,Th,Sa-HD Derrill KayBridget Mary Whelan, NP   0.5 mcg at 06/07/14 1200  . cinacalcet (SENSIPAR) tablet 30 mg  30 mg Oral Q breakfast Derrill KayBridget Mary Whelan, NP   30 mg at 06/09/14 0748  . Darbepoetin Alfa (ARANESP) injection 40 mcg  40 mcg Intravenous Q Sat-HD Derrill KayBridget Mary Whelan, NP   40 mcg at 06/07/14 1208  . enoxaparin (LOVENOX) injection 55 mg  55 mg Subcutaneous Q24H Calvert CantorSaima Rizwan, MD   55 mg at 06/09/14 1239  . feeding supplement (NEPRO CARB STEADY) liquid 237 mL  237 mL Oral BID BM Jenifer A Williams, RD   237 mL at 06/09/14 1039  . ferric gluconate (NULECIT) 125 mg in sodium chloride 0.9 % 100 mL IVPB  125 mg Intravenous Q T,Th,Sa-HD Derrill KayBridget Mary Whelan, NP   125 mg  at 06/07/14 1206  . HYDROcodone-acetaminophen (NORCO/VICODIN) 5-325 MG per tablet 1-2 tablet  1-2 tablet Oral Q4H PRN Eduard ClosArshad N Kakrakandy, MD      . LORazepam (ATIVAN) injection 0.5-1 mg  0.5-1 mg Intravenous Q6H PRN Calvert CantorSaima Rizwan, MD      . midodrine (PROAMATINE) tablet 10 mg  10 mg Oral TID WC Shanker Levora DredgeM Ghimire, MD   10 mg at 06/09/14 1239  . multivitamin (RENA-VIT) tablet 1 tablet  1 tablet Oral QHS Eduard ClosArshad N Kakrakandy, MD   1 tablet at 06/08/14 2107  . ondansetron (ZOFRAN) tablet 4 mg  4 mg Oral Q6H PRN Eduard ClosArshad N Kakrakandy, MD       Or  . ondansetron [facility](ZOFRAN) injection 4 mg  4 mg Intravenous Q6H PRN Eduard ClosArshad N Kakrakandy, MD      . pneumococcal 23 valent vaccine (PNU-IMMUNE) injection 0.5 mL  0.5 mL Intramuscular Tomorrow-1000 Maretta BeesShanker M Ghimire, MD   0.5 mL at 06/08/14 1000  . sevelamer carbonate (RENVELA) tablet 800 mg  800 mg Oral TID WC Derrill KayBridget Mary Whelan, NP   800 mg at 06/09/14 1239  . sodium chloride 0.9 % injection 3 mL  3 mL Intravenous Q12H Eduard ClosArshad N Kakrakandy, MD   3 mL at 06/08/14 1137  . warfarin (COUMADIN) tablet  5 mg  5 mg Oral ONCE-1800 Calvert CantorSaima Rizwan, MD      . Warfarin - Pharmacist Dosing Inpatient   Does not apply [address]q1800 Maryanna ShapeJennifer Danielle [place], [facility] CountyRPH   1 each at 06/07/14 1800    Pt meds include: Statin :No Betablocker: Yes ASA: Yes Other anticoagulants/antiplatelets: none  Past Medical History  Diagnosis Date  . Gout   . Renal insufficiency   . Hypertension   . Hyperlipemia     Past Surgical History  Procedure Laterality Date  . Tubal ligation    . Av fistula placement      Social History History  Substance Use Topics  . Smoking status: Never Smoker   . Smokeless tobacco: Never Used  . Alcohol Use: No    Family History Family History  Problem Relation Age of Onset  . Colon cancer Neg Hx   . Diabetes Mellitus II Neg Hx     Allergies  Allergen Reactions  . Nsaids     REACTION: Avoid NSAIDS(renal failure)     REVIEW OF SYSTEMS  General: [ ]   Weight loss, [ ]  Fever, [ ]  chills Neurologic: [ ]  Dizziness, [ ]  Blackouts, [ ]  Seizure [ ]  Stroke, [ ]  "Mini stroke", [ ]  Slurred speech, [ ]  Temporary blindness; [ ]  weakness in arms or legs, [ ]  Hoarseness [ ]  Dysphagia Cardiac: [ ]  Chest pain/pressure, [ ]  Shortness of breath at rest [ ]  Shortness of breath with exertion, [ ]  Atrial fibrillation or irregular heartbeat  Vascular: [ ]  Pain in legs with walking, [ ]  Pain in legs at rest, [ ]  Pain in legs at night,  [ ]  Non-healing ulcer, [ ]  Blood clot in vein/DVT,   Pulmonary: [ ]  Home oxygen, [ ]  Productive cough, [ ]  Coughing up blood, [ ]  Asthma,  [ ]  Wheezing [ ]  COPD Musculoskeletal:  [ ]  Arthritis, [ ]  Low back pain, [ ]  Joint pain Hematologic: [ ]  Easy Bruising, [ ]  Anemia; [ ]  Hepatitis Gastrointestinal: [ ]  Blood in stool, [ ]  Gastroesophageal Reflux/heartburn, Urinary: [ ]  chronic Kidney disease, [ ]  on HD - [ ]  MWF or [ ]  TTHS, [ ]  Burning with urination, [ ]  Difficulty urinating Skin: [ ]  Rashes, [ ]  Wounds Psychological: [ ]  Anxiety, [ ]  Depression  Physical Examination Filed Vitals:   06/08/14 2140 06/08/14 2224 06/09/14 0636 06/09/14 1315  BP: 117/65  93/49 108/63  Pulse: 73  81 81  Temp: 97.8 F (36.6 C)  98.3 F (36.8 C) 98 F (36.7 C)  TempSrc: Oral  Oral Oral  Resp: 18  16 18   Height:      Weight:   122 lb 9.2 oz (55.6 kg)   SpO2: 100% 100% 99% 100%   Body mass index is 23.94 kg/(m^2).  General:  WDWN in NAD Gait: Normal HENT: WNL Eyes: Pupils equal Pulmonary: normal non-labored breathing , without Rales, rhonchi,  wheezing Cardiac: RRR, without  Murmurs, rubs or gallops; No carotid bruits Abdomen: soft, NT, no masses Skin: no rashes, ulcers noted;  no Gangrene , no cellulitis; no open wounds;   Vascular Exam/Pulses:palpable thrill throughout the left upper arm AV fistula.  Palpable radial pulse.  No signs of steal.  Bruising volar central fistula appears old.   Musculoskeletal: no muscle wasting or  atrophy; no edema  Neurologic: A&O X 3; Appropriate Affect ;  SENSATION: normal; MOTOR FUNCTION: 5/5 Symmetric Speech is fluent/normal   Significant Diagnostic Studies: CBC Lab Results  Component Value  Date   WBC 8.2 06/09/2014   HGB 8.3* 06/09/2014   HCT 27.3* 06/09/2014   MCV 96.1 06/09/2014   PLT 239 06/09/2014    BMET    Component Value Date/Time   NA 138 06/09/2014 0440   K 4.1 06/09/2014 0440   CL 100 06/09/2014 0440   CO2 24 06/09/2014 0440   GLUCOSE 66* 06/09/2014 0440   BUN 13 06/09/2014 0440   CREATININE 3.44* 06/09/2014 0440   CALCIUM 9.0 06/09/2014 0440   CALCIUM 9.6 04/10/2014 0917   GFRNONAA 12* 06/09/2014 0440   GFRAA 14* 06/09/2014 0440   Estimated Creatinine Clearance: 10.4 mL/min (by C-G formula based on Cr of 3.44).  COAG Lab Results  Component Value Date   INR 1.45 06/09/2014   INR 1.29 06/08/2014   INR 1.11 06/07/2014     Non-Invasive Vascular Imaging: none recent   ASSESSMENT/PLAN:  Left AV fistula  A duplex was done in 2014 to evaluate the fistula and it suggested a stenosis as the cephalic vein enters the subclavian vein with a markedly elevated velocities.  The fistula had not been used at that time.  She may benefit from a shuntogram and angioplasty.  I will discuss this with Dr. Darrick Penna.   Clinton [facility] 06/09/2014 2:27 PM    History and exam details as above.  Recent fistula infiltration no real hematoma or pain at this point.  Fistula is pulsatile in character and I agree with Marisue Humble that most likely this represents stenosis centrally at either the cephalic insertion of subclavian vein.  Will schedule pt for shuntogram possible intervention on Friday.  Ok with lovenox but will need this held periprocedure.  Would delay starting her coumadin until after the procedure Friday.  Will vein map right arm in the event fistula is not salvageable.  Pt may also need Diatek catheter.  Fabienne Bruns, MD [facility] [place] Office: [phone] Pager: [phone]

## 2014-06-09 NOTE — Care Management Note (Addendum)
    Page 1 of 2   06/09/2014     2:38:18 PM CARE MANAGEMENT NOTE 06/09/2014  Patient:  [name],[name]   Account Number:  [id number]  Date Initiated:  06/09/2014  Documentation initiated by:  [name],[name]  Subjective/Objective Assessment:   dx  hypotension, dvt  admit- lives with family.     Action/Plan:   pt eval- hhpt.   Anticipated DC Date:  06/09/2014   Anticipated DC Plan:  HOME W HOME HEALTH SERVICES      DC Planning Services  CM consult      [facility]   Choice offered to / List presented to:  C-1 Patient   DME arranged  3-N-1  TUB BENCH  WALKER - ROLLING      DME agency  [facility]     HH arranged  HH-1 RN  HH-2 PT      [facility]   Status of service:  Completed, signed off Medicare Important Message given?  YES (If response is "NO", the following Medicare IM given date fields will be blank) Date Medicare IM given:  06/09/2014 Medicare IM given by:  [name],[name] Date Additional Medicare IM given:   Additional Medicare IM given by:    Discharge Disposition:  HOME W HOME HEALTH SERVICES  Per UR Regulation:  Reviewed for med. necessity/level of care/duration of stay  If discussed at Long Length of Stay Meetings, dates discussed:    Comments:  06/09/14 1333 [name], [name] [phone] [id number] patient is for dc today, [facility], [facility], [name] notified.  Also [name] notified for bsc, rolling walker and tub bench.  Soc will begin 24-48 hrs post dc.  Lovenox is $1. 26 per benefit check.

## 2014-06-10 ENCOUNTER — Other Ambulatory Visit: Payer: Self-pay

## 2014-06-12 ENCOUNTER — Encounter (HOSPITAL_COMMUNITY): Payer: Self-pay | Admitting: Emergency Medicine

## 2014-06-12 ENCOUNTER — Inpatient Hospital Stay (HOSPITAL_COMMUNITY)
Admission: EM | Admit: 2014-06-12 | Discharge: 2014-06-23 | DRG: 682 | Disposition: A | Discharge status: Skilled Nursing Facility | Payer: PRIVATE HEALTH INSURANCE | Attending: Family Medicine | Admitting: Family Medicine

## 2014-06-12 DIAGNOSIS — F0390 Unspecified dementia without behavioral disturbance: Secondary | ICD-10-CM | POA: Diagnosis present

## 2014-06-12 DIAGNOSIS — D631 Anemia in chronic kidney disease: Secondary | ICD-10-CM | POA: Diagnosis present

## 2014-06-12 DIAGNOSIS — I120 Hypertensive chronic kidney disease with stage 5 chronic kidney disease or end stage renal disease: Secondary | ICD-10-CM | POA: Diagnosis not present

## 2014-06-12 DIAGNOSIS — I82409 Acute embolism and thrombosis of unspecified deep veins of unspecified lower extremity: Secondary | ICD-10-CM | POA: Diagnosis present

## 2014-06-12 DIAGNOSIS — G9340 Encephalopathy, unspecified: Secondary | ICD-10-CM | POA: Diagnosis not present

## 2014-06-12 DIAGNOSIS — E8770 Fluid overload, unspecified: Secondary | ICD-10-CM | POA: Diagnosis present

## 2014-06-12 DIAGNOSIS — J45909 Unspecified asthma, uncomplicated: Secondary | ICD-10-CM | POA: Diagnosis present

## 2014-06-12 DIAGNOSIS — Z6823 Body mass index (BMI) 23.0-23.9, adult: Secondary | ICD-10-CM

## 2014-06-12 DIAGNOSIS — R0602 Shortness of breath: Secondary | ICD-10-CM | POA: Diagnosis not present

## 2014-06-12 DIAGNOSIS — E785 Hyperlipidemia, unspecified: Secondary | ICD-10-CM | POA: Diagnosis present

## 2014-06-12 DIAGNOSIS — Z7901 Long term (current) use of anticoagulants: Secondary | ICD-10-CM

## 2014-06-12 DIAGNOSIS — I959 Hypotension, unspecified: Secondary | ICD-10-CM | POA: Diagnosis not present

## 2014-06-12 DIAGNOSIS — Z66 Do not resuscitate: Secondary | ICD-10-CM | POA: Diagnosis present

## 2014-06-12 DIAGNOSIS — Z79899 Other long term (current) drug therapy: Secondary | ICD-10-CM

## 2014-06-12 DIAGNOSIS — M109 Gout, unspecified: Secondary | ICD-10-CM | POA: Diagnosis present

## 2014-06-12 DIAGNOSIS — I517 Cardiomegaly: Secondary | ICD-10-CM | POA: Diagnosis present

## 2014-06-12 DIAGNOSIS — R0902 Hypoxemia: Secondary | ICD-10-CM | POA: Diagnosis present

## 2014-06-12 DIAGNOSIS — N186 End stage renal disease: Secondary | ICD-10-CM

## 2014-06-12 DIAGNOSIS — Z992 Dependence on renal dialysis: Secondary | ICD-10-CM

## 2014-06-12 DIAGNOSIS — Z515 Encounter for palliative care: Secondary | ICD-10-CM

## 2014-06-12 DIAGNOSIS — Z9889 Other specified postprocedural states: Secondary | ICD-10-CM

## 2014-06-12 DIAGNOSIS — F0391 Unspecified dementia with behavioral disturbance: Secondary | ICD-10-CM | POA: Diagnosis present

## 2014-06-12 DIAGNOSIS — N390 Urinary tract infection, site not specified: Secondary | ICD-10-CM | POA: Diagnosis present

## 2014-06-12 DIAGNOSIS — Z888 Allergy status to other drugs, medicaments and biological substances status: Secondary | ICD-10-CM

## 2014-06-12 DIAGNOSIS — N12 Tubulo-interstitial nephritis, not specified as acute or chronic: Secondary | ICD-10-CM | POA: Diagnosis present

## 2014-06-12 DIAGNOSIS — I5030 Unspecified diastolic (congestive) heart failure: Secondary | ICD-10-CM

## 2014-06-12 DIAGNOSIS — J45901 Unspecified asthma with (acute) exacerbation: Secondary | ICD-10-CM | POA: Diagnosis present

## 2014-06-12 DIAGNOSIS — F411 Generalized anxiety disorder: Secondary | ICD-10-CM | POA: Diagnosis present

## 2014-06-12 DIAGNOSIS — R630 Anorexia: Secondary | ICD-10-CM | POA: Diagnosis present

## 2014-06-12 DIAGNOSIS — Z7189 Other specified counseling: Secondary | ICD-10-CM

## 2014-06-12 DIAGNOSIS — R197 Diarrhea, unspecified: Secondary | ICD-10-CM | POA: Diagnosis present

## 2014-06-12 DIAGNOSIS — E43 Unspecified severe protein-calorie malnutrition: Secondary | ICD-10-CM | POA: Diagnosis present

## 2014-06-12 NOTE — ED Notes (Signed)
Patient with shortness of breath, sudden onset.  Patient went to sleep earlier and now with shortness of breath.  Patient did miss dialysis yesterday due to not feeling well.  Patient has had 5mg  Albuterol and 0.5 mg of Atrovent, 125mg  solumedrol by EMS en route to ED.  Patient is CAOx3 upon arrival to ED.

## 2014-06-13 ENCOUNTER — Emergency Department (HOSPITAL_COMMUNITY): Payer: PRIVATE HEALTH INSURANCE

## 2014-06-13 ENCOUNTER — Other Ambulatory Visit: Payer: Self-pay

## 2014-06-13 ENCOUNTER — Encounter (HOSPITAL_COMMUNITY): Payer: Self-pay | Admitting: Internal Medicine

## 2014-06-13 ENCOUNTER — Ambulatory Visit (HOSPITAL_COMMUNITY)
Admission: RE | Admit: 2014-06-13 | Payer: PRIVATE HEALTH INSURANCE | Source: Ambulatory Visit | Admitting: Vascular Surgery

## 2014-06-13 ENCOUNTER — Encounter (HOSPITAL_COMMUNITY)
Admission: EM | Disposition: A | Discharge status: Skilled Nursing Facility | Payer: Self-pay | Source: Home / Self Care | Attending: Family Medicine

## 2014-06-13 DIAGNOSIS — R197 Diarrhea, unspecified: Secondary | ICD-10-CM | POA: Diagnosis present

## 2014-06-13 DIAGNOSIS — N186 End stage renal disease: Secondary | ICD-10-CM | POA: Diagnosis present

## 2014-06-13 DIAGNOSIS — I959 Hypotension, unspecified: Secondary | ICD-10-CM | POA: Diagnosis not present

## 2014-06-13 DIAGNOSIS — E8770 Fluid overload, unspecified: Secondary | ICD-10-CM | POA: Diagnosis present

## 2014-06-13 DIAGNOSIS — Z6823 Body mass index (BMI) 23.0-23.9, adult: Secondary | ICD-10-CM | POA: Diagnosis not present

## 2014-06-13 DIAGNOSIS — F411 Generalized anxiety disorder: Secondary | ICD-10-CM | POA: Diagnosis present

## 2014-06-13 DIAGNOSIS — E785 Hyperlipidemia, unspecified: Secondary | ICD-10-CM | POA: Diagnosis present

## 2014-06-13 DIAGNOSIS — J4520 Mild intermittent asthma, uncomplicated: Secondary | ICD-10-CM

## 2014-06-13 DIAGNOSIS — Z9889 Other specified postprocedural states: Secondary | ICD-10-CM | POA: Diagnosis not present

## 2014-06-13 DIAGNOSIS — Z888 Allergy status to other drugs, medicaments and biological substances status: Secondary | ICD-10-CM | POA: Diagnosis not present

## 2014-06-13 DIAGNOSIS — J45909 Unspecified asthma, uncomplicated: Secondary | ICD-10-CM | POA: Diagnosis present

## 2014-06-13 DIAGNOSIS — Z515 Encounter for palliative care: Secondary | ICD-10-CM | POA: Diagnosis not present

## 2014-06-13 DIAGNOSIS — Z992 Dependence on renal dialysis: Secondary | ICD-10-CM | POA: Diagnosis not present

## 2014-06-13 DIAGNOSIS — Z7901 Long term (current) use of anticoagulants: Secondary | ICD-10-CM | POA: Diagnosis not present

## 2014-06-13 DIAGNOSIS — R0602 Shortness of breath: Secondary | ICD-10-CM

## 2014-06-13 DIAGNOSIS — N390 Urinary tract infection, site not specified: Secondary | ICD-10-CM | POA: Diagnosis present

## 2014-06-13 DIAGNOSIS — I82409 Acute embolism and thrombosis of unspecified deep veins of unspecified lower extremity: Secondary | ICD-10-CM

## 2014-06-13 DIAGNOSIS — Z79899 Other long term (current) drug therapy: Secondary | ICD-10-CM | POA: Diagnosis not present

## 2014-06-13 DIAGNOSIS — D631 Anemia in chronic kidney disease: Secondary | ICD-10-CM | POA: Diagnosis present

## 2014-06-13 DIAGNOSIS — I120 Hypertensive chronic kidney disease with stage 5 chronic kidney disease or end stage renal disease: Secondary | ICD-10-CM | POA: Diagnosis present

## 2014-06-13 DIAGNOSIS — E43 Unspecified severe protein-calorie malnutrition: Secondary | ICD-10-CM | POA: Diagnosis present

## 2014-06-13 DIAGNOSIS — N12 Tubulo-interstitial nephritis, not specified as acute or chronic: Secondary | ICD-10-CM | POA: Diagnosis present

## 2014-06-13 DIAGNOSIS — G9340 Encephalopathy, unspecified: Secondary | ICD-10-CM | POA: Diagnosis not present

## 2014-06-13 DIAGNOSIS — M109 Gout, unspecified: Secondary | ICD-10-CM | POA: Diagnosis present

## 2014-06-13 DIAGNOSIS — I517 Cardiomegaly: Secondary | ICD-10-CM | POA: Diagnosis present

## 2014-06-13 DIAGNOSIS — Z66 Do not resuscitate: Secondary | ICD-10-CM | POA: Diagnosis present

## 2014-06-13 DIAGNOSIS — R0902 Hypoxemia: Secondary | ICD-10-CM | POA: Diagnosis present

## 2014-06-13 DIAGNOSIS — F0391 Unspecified dementia with behavioral disturbance: Secondary | ICD-10-CM | POA: Diagnosis present

## 2014-06-13 LAB — URINALYSIS, ROUTINE W REFLEX MICROSCOPIC
Bilirubin Urine: NEGATIVE
GLUCOSE, UA: NEGATIVE mg/dL
Ketones, ur: NEGATIVE mg/dL
Nitrite: NEGATIVE
Protein, ur: 100 mg/dL — AB
SPECIFIC GRAVITY, URINE: 1.009 (ref 1.005–1.030)
UROBILINOGEN UA: 0.2 mg/dL (ref 0.0–1.0)
pH: 8 (ref 5.0–8.0)

## 2014-06-13 LAB — COMPREHENSIVE METABOLIC PANEL
ALT: 9 U/L (ref 0–35)
ANION GAP: 17 — AB (ref 5–15)
AST: 19 U/L (ref 0–37)
Albumin: 2.2 g/dL — ABNORMAL LOW (ref 3.5–5.2)
Alkaline Phosphatase: 58 U/L (ref 39–117)
BUN: 22 mg/dL (ref 6–23)
CO2: 22 mEq/L (ref 19–32)
Calcium: 8.5 mg/dL (ref 8.4–10.5)
Chloride: 99 mEq/L (ref 96–112)
Creatinine, Ser: 4.92 mg/dL — ABNORMAL HIGH (ref 0.50–1.10)
GFR calc Af Amer: 9 mL/min — ABNORMAL LOW (ref >90)
GFR calc non Af Amer: 8 mL/min — ABNORMAL LOW (ref >90)
Glucose, Bld: 140 mg/dL — ABNORMAL HIGH (ref 70–99)
POTASSIUM: 4 meq/L (ref 3.7–5.3)
Sodium: 138 mEq/L (ref 137–147)
TOTAL PROTEIN: 5.9 g/dL — AB (ref 6.0–8.3)
Total Bilirubin: 0.3 mg/dL (ref 0.3–1.2)

## 2014-06-13 LAB — CBC WITH DIFFERENTIAL/PLATELET
Basophils Absolute: 0 10*3/uL (ref 0.0–0.1)
Basophils Absolute: 0 10*3/uL (ref 0.0–0.1)
Basophils Relative: 0 % (ref 0–1)
Basophils Relative: 0 % (ref 0–1)
Eosinophils Absolute: 0 10*3/uL (ref 0.0–0.7)
Eosinophils Absolute: 0 10*3/uL (ref 0.0–0.7)
Eosinophils Relative: 0 % (ref 0–5)
Eosinophils Relative: 0 % (ref 0–5)
HCT: 30.8 % — ABNORMAL LOW (ref 36.0–46.0)
HEMATOCRIT: 28.5 % — AB (ref 36.0–46.0)
HEMOGLOBIN: 9.6 g/dL — AB (ref 12.0–15.0)
Hemoglobin: 8.9 g/dL — ABNORMAL LOW (ref 12.0–15.0)
LYMPHS PCT: 44 % (ref 12–46)
Lymphocytes Relative: 10 % — ABNORMAL LOW (ref 12–46)
Lymphs Abs: 0.9 10*3/uL (ref 0.7–4.0)
Lymphs Abs: 4.5 10*3/uL — ABNORMAL HIGH (ref 0.7–4.0)
MCH: 29.7 pg (ref 26.0–34.0)
MCH: 29.9 pg (ref 26.0–34.0)
MCHC: 31.2 g/dL (ref 30.0–36.0)
MCHC: 31.2 g/dL (ref 30.0–36.0)
MCV: 95.4 fL (ref 78.0–100.0)
MCV: 95.6 fL (ref 78.0–100.0)
MONO ABS: 0.1 10*3/uL (ref 0.1–1.0)
MONOS PCT: 5 % (ref 3–12)
Monocytes Absolute: 0.5 10*3/uL (ref 0.1–1.0)
Monocytes Relative: 1 % — ABNORMAL LOW (ref 3–12)
NEUTROS ABS: 5 10*3/uL (ref 1.7–7.7)
Neutro Abs: 8.4 10*3/uL — ABNORMAL HIGH (ref 1.7–7.7)
Neutrophils Relative %: 51 % (ref 43–77)
Neutrophils Relative %: 89 % — ABNORMAL HIGH (ref 43–77)
Platelets: 207 10*3/uL (ref 150–400)
Platelets: 217 10*3/uL (ref 150–400)
RBC: 2.98 MIL/uL — AB (ref 3.87–5.11)
RBC: 3.23 MIL/uL — AB (ref 3.87–5.11)
RDW: 16.3 % — ABNORMAL HIGH (ref 11.5–15.5)
RDW: 16.4 % — ABNORMAL HIGH (ref 11.5–15.5)
WBC: 10.1 10*3/uL (ref 4.0–10.5)
WBC: 9.4 10*3/uL (ref 4.0–10.5)

## 2014-06-13 LAB — BASIC METABOLIC PANEL
Anion gap: 19 — ABNORMAL HIGH (ref 5–15)
BUN: 20 mg/dL (ref 6–23)
CHLORIDE: 96 meq/L (ref 96–112)
CO2: 23 mEq/L (ref 19–32)
Calcium: 8.8 mg/dL (ref 8.4–10.5)
Creatinine, Ser: 4.81 mg/dL — ABNORMAL HIGH (ref 0.50–1.10)
GFR calc Af Amer: 9 mL/min — ABNORMAL LOW (ref >90)
GFR calc non Af Amer: 8 mL/min — ABNORMAL LOW (ref >90)
GLUCOSE: 72 mg/dL (ref 70–99)
Potassium: 3.7 mEq/L (ref 3.7–5.3)
SODIUM: 138 meq/L (ref 137–147)

## 2014-06-13 LAB — POCT I-STAT, CHEM 8
BUN: 19 mg/dL (ref 6–23)
Calcium, Ion: 1.01 mmol/L — ABNORMAL LOW (ref 1.13–1.30)
Chloride: 100 mEq/L (ref 96–112)
Creatinine, Ser: 4.7 mg/dL — ABNORMAL HIGH (ref 0.50–1.10)
Glucose, Bld: 71 mg/dL (ref 70–99)
HCT: 30 % — ABNORMAL LOW (ref 36.0–46.0)
Hemoglobin: 10.2 g/dL — ABNORMAL LOW (ref 12.0–15.0)
POTASSIUM: 3.5 meq/L — AB (ref 3.7–5.3)
SODIUM: 136 meq/L — AB (ref 137–147)
TCO2: 23 mmol/L (ref 0–100)

## 2014-06-13 LAB — PROTIME-INR
INR: 3.13 — ABNORMAL HIGH (ref 0.00–1.49)
Prothrombin Time: 32.4 seconds — ABNORMAL HIGH (ref 11.6–15.2)

## 2014-06-13 LAB — URINE MICROSCOPIC-ADD ON

## 2014-06-13 LAB — TROPONIN I: Troponin I: <0.3 ng/mL (ref <0.30)

## 2014-06-13 LAB — TSH: TSH: 0.821 u[IU]/mL (ref 0.350–4.500)

## 2014-06-13 SURGERY — ASSESSMENT, SHUNT FUNCTION, WITH CONTRAST RADIOGRAPHIC STUDY
Anesthesia: LOCAL | Laterality: Left

## 2014-06-13 MED ORDER — ALTEPLASE 2 MG IJ SOLR: 2.0000 mg | Freq: Once | INTRAMUSCULAR | Status: AC | PRN

## 2014-06-13 MED ORDER — HEPARIN SODIUM (PORCINE) 1000 UNIT/ML DIALYSIS
1000.0000 [IU] | INTRAMUSCULAR | Status: DC | PRN
Start: 2014-06-13 — End: 2014-06-13

## 2014-06-13 MED ORDER — NEPRO/CARBSTEADY PO LIQD
237.0000 mL | Freq: Two times a day (BID) | ORAL | Status: DC
  Administered 2014-06-13 – 2014-06-23 (×15): 237 mL via ORAL
  Filled 2014-06-13 (×12): qty 237

## 2014-06-13 MED ORDER — NEPRO/CARBSTEADY PO LIQD: 237.0000 mL | ORAL | Status: DC | PRN

## 2014-06-13 MED ORDER — NITROGLYCERIN 0.4 MG SL SUBL: 0.4000 mg | SUBLINGUAL_TABLET | SUBLINGUAL | Status: DC | PRN

## 2014-06-13 MED ORDER — SEVELAMER CARBONATE 800 MG PO TABS
800.0000 mg | ORAL_TABLET | Freq: Three times a day (TID) | ORAL | Status: DC
  Administered 2014-06-13 – 2014-06-23 (×24): 800 mg via ORAL
  Filled 2014-06-13 (×39): qty 1

## 2014-06-13 MED ORDER — HYDROCODONE-ACETAMINOPHEN 5-325 MG PO TABS
1.0000 | ORAL_TABLET | ORAL | Status: DC | PRN
  Administered 2014-06-15 – 2014-06-16 (×4): 1 via ORAL
  Filled 2014-06-13 (×4): qty 1

## 2014-06-13 MED ORDER — DARBEPOETIN ALFA 40 MCG/0.4ML IJ SOSY: 40.0000 ug | PREFILLED_SYRINGE | INTRAMUSCULAR | Status: DC

## 2014-06-13 MED ORDER — HEPARIN SODIUM (PORCINE) 1000 UNIT/ML DIALYSIS: 2500.0000 [IU] | INTRAMUSCULAR | Status: DC | PRN

## 2014-06-13 MED ORDER — LIDOCAINE-PRILOCAINE 2.5-2.5 % EX CREA
1.0000 | TOPICAL_CREAM | CUTANEOUS | Status: DC | PRN
Start: 2014-06-13 — End: 2014-06-13

## 2014-06-13 MED ORDER — SODIUM CHLORIDE 0.9 % IV SOLN
100.0000 mL | INTRAVENOUS | Status: DC | PRN
Start: 2014-06-13 — End: 2014-06-13

## 2014-06-13 MED ORDER — MIDODRINE HCL 5 MG PO TABS
10.0000 mg | ORAL_TABLET | Freq: Three times a day (TID) | ORAL | Status: DC
  Administered 2014-06-13 – 2014-06-23 (×26): 10 mg via ORAL
  Filled 2014-06-13 (×39): qty 2

## 2014-06-13 MED ORDER — SODIUM CHLORIDE 0.9 % IV SOLN: 100.0000 mL | INTRAVENOUS | Status: DC | PRN

## 2014-06-13 MED ORDER — SODIUM CHLORIDE 0.9 % IV SOLN: 125.0000 mg | INTRAVENOUS | Status: DC

## 2014-06-13 MED ORDER — CINACALCET HCL 30 MG PO TABS
30.0000 mg | ORAL_TABLET | Freq: Every day | ORAL | Status: DC
  Administered 2014-06-13 – 2014-06-23 (×10): 30 mg via ORAL
  Filled 2014-06-13 (×16): qty 1

## 2014-06-13 MED ORDER — ALBUTEROL SULFATE (2.5 MG/3ML) 0.083% IN NEBU
3.0000 mL | INHALATION_SOLUTION | Freq: Every day | RESPIRATORY_TRACT | Status: DC
  Administered 2014-06-13 – 2014-06-20 (×7): 3 mL via RESPIRATORY_TRACT
  Filled 2014-06-13 (×8): qty 3

## 2014-06-13 MED ORDER — ALTEPLASE 2 MG IJ SOLR: 2.0000 mg | Freq: Once | INTRAMUSCULAR | Status: DC | PRN

## 2014-06-13 MED ORDER — LIDOCAINE HCL (PF) 1 % IJ SOLN
5.0000 mL | INTRAMUSCULAR | Status: DC | PRN
Start: 2014-06-13 — End: 2014-06-13

## 2014-06-13 MED ORDER — NEPRO/CARBSTEADY PO LIQD
237.0000 mL | ORAL | Status: DC | PRN
  Filled 2014-06-13: qty 237

## 2014-06-13 MED ORDER — ACETAMINOPHEN 650 MG RE SUPP: 650.0000 mg | Freq: Four times a day (QID) | RECTAL | Status: DC | PRN

## 2014-06-13 MED ORDER — HEPARIN SODIUM (PORCINE) 1000 UNIT/ML DIALYSIS
1000.0000 [IU] | INTRAMUSCULAR | Status: DC | PRN
  Filled 2014-06-13: qty 1

## 2014-06-13 MED ORDER — ACETAMINOPHEN 325 MG PO TABS
650.0000 mg | ORAL_TABLET | Freq: Four times a day (QID) | ORAL | Status: DC | PRN
  Filled 2014-06-13: qty 2

## 2014-06-13 MED ORDER — SODIUM CHLORIDE 0.9 % IJ SOLN
3.0000 mL | Freq: Two times a day (BID) | INTRAMUSCULAR | Status: DC
  Administered 2014-06-16 – 2014-06-22 (×7): 3 mL via INTRAVENOUS

## 2014-06-13 MED ORDER — HEPARIN SODIUM (PORCINE) 1000 UNIT/ML DIALYSIS: 20.0000 [IU]/kg | INTRAMUSCULAR | Status: DC | PRN

## 2014-06-13 MED ORDER — SODIUM CHLORIDE 0.9 % IV SOLN
125.0000 mg | INTRAVENOUS | Status: AC
  Administered 2014-06-14 – 2014-06-21 (×4): 125 mg via INTRAVENOUS
  Filled 2014-06-13 (×8): qty 10

## 2014-06-13 MED ORDER — PENTAFLUOROPROP-TETRAFLUOROETH EX AERO: 1.0000 "application " | INHALATION_SPRAY | CUTANEOUS | Status: DC | PRN

## 2014-06-13 MED ORDER — LIDOCAINE-PRILOCAINE 2.5-2.5 % EX CREA: 1.0000 "application " | TOPICAL_CREAM | CUTANEOUS | Status: DC | PRN

## 2014-06-13 MED ORDER — CEFTRIAXONE SODIUM IN DEXTROSE 20 MG/ML IV SOLN
1.0000 g | INTRAVENOUS | Status: DC
  Administered 2014-06-13 – 2014-06-21 (×9): 1 g via INTRAVENOUS
  Filled 2014-06-13 (×9): qty 50

## 2014-06-13 MED ORDER — RENA-VITE PO TABS
1.0000 | ORAL_TABLET | Freq: Every day | ORAL | Status: DC
  Administered 2014-06-13 – 2014-06-23 (×11): 1 via ORAL
  Filled 2014-06-13 (×13): qty 1

## 2014-06-13 MED ORDER — ONDANSETRON HCL 4 MG/2ML IJ SOLN: 4.0000 mg | Freq: Four times a day (QID) | INTRAMUSCULAR | Status: DC | PRN

## 2014-06-13 MED ORDER — SODIUM CHLORIDE 0.9 % IV BOLUS (SEPSIS)
500.0000 mL | Freq: Once | INTRAVENOUS | Status: AC
  Administered 2014-06-13: 500 mL via INTRAVENOUS

## 2014-06-13 MED ORDER — LORAZEPAM 2 MG/ML IJ SOLN
0.5000 mg | Freq: Once | INTRAMUSCULAR | Status: AC
  Administered 2014-06-13: 0.5 mg via INTRAVENOUS

## 2014-06-13 MED ORDER — LIDOCAINE HCL (PF) 1 % IJ SOLN: 5.0000 mL | INTRAMUSCULAR | Status: DC | PRN

## 2014-06-13 MED ORDER — LORAZEPAM 0.5 MG PO TABS
0.5000 mg | ORAL_TABLET | Freq: Two times a day (BID) | ORAL | Status: DC | PRN
  Administered 2014-06-13 – 2014-06-21 (×7): 0.5 mg via ORAL
  Filled 2014-06-13 (×7): qty 1

## 2014-06-13 MED ORDER — ONDANSETRON HCL 4 MG PO TABS: 4.0000 mg | ORAL_TABLET | Freq: Four times a day (QID) | ORAL | Status: DC | PRN

## 2014-06-13 NOTE — Progress Notes (Signed)
Chart review: 5/04 - chest pain > normal heart cath, LVEF 45-50%, CKD creat 2.1, anemia, hx asthma 10/07 - acute/chronic renal failure d/t low BP/ ACEi, HTN, CKD 3/08 - abd/ back pain / fever / dysuria > EColi UTI, acute on CKD resolved, f/u OP with renal MD 11/25 - 06/09/14 > SOB and AMS x 1 week > just recently started HD, had 3 sessions. In ED pt with low BP/s, no fever. UA dirty > admitted , dx was SOB d/t anxiety vs small PE. LE doppler +for DVT. No RV strain on echo. Clinically stable w/o hypoxemia so VQ/ CT angio were not done. Rx hep/ coumadin for DVT, f/u PCP. EColi UTI rx abx. AMS head CT neg, felt underlying dementia c/b UTI, etc.   Vinson Moselleob Drystan Reader MD (pgr) [phone]    [phone]) [phone] 06/13/2014, 2:52 PM

## 2014-06-13 NOTE — ED Provider Notes (Signed)
CSN: [id number]     Arrival date & time 06/12/14  2347 History   This chart was scribed for [name] OctaveStephen Tonia Avino, MD by Freida Busmaniana Omoyeni, ED Scribe. This patient was seen in room B18C/B18C and the patient's care was started 11:54PM.    Chief Complaint  Patient presents with  . Shortness of Breath     The history is provided by the patient, the EMS personnel and a caregiver. No language interpreter was used.    HPI Comments:  [name] is a 80 y.o. female with a history of asthma brought in by ambulance, who presents to the Emergency Department complaining of moderate-severe trouble breathing that started this evening. Pt was given a breathing treatment PTA with mild relief. Pt's caregiver reported to EMS that the pt missed her dialysis today due to diarrhea. At this time pt denies pain of any kind; she also denies dizziness and lightheadedness. She is currently on coumadin. Pt was recently admitted for the same per caregiver and discharged on 06/09/2014.   Past Medical History  Diagnosis Date  . Gout   . Renal insufficiency   . Hypertension   . Hyperlipemia    Past Surgical History  Procedure Laterality Date  . Tubal ligation    . Av fistula placement     Family History  Problem Relation Age of Onset  . Colon cancer Neg Hx   . Diabetes Mellitus II Neg Hx    History  Substance Use Topics  . Smoking status: Never Smoker   . Smokeless tobacco: Never Used  . Alcohol Use: No   OB History    No data available     Review of Systems  A complete 10 system review of systems was obtained and all systems are negative except as noted in the HPI and PMH.    Allergies  Nsaids  Home Medications   Prior to Admission medications   Medication Sig Start Date End Date Taking? Authorizing Provider  albuterol (PROAIR HFA) 108 (90 BASE) MCG/ACT inhaler Inhale 1 puff into the lungs at bedtime.   Yes Historical Provider, MD  cinacalcet (SENSIPAR) 30 MG tablet Take 1 tablet (30 mg total)  by mouth daily with breakfast. 06/09/14  Yes Tora KindredMarianne L York, PA-C  enoxaparin (LOVENOX) 60 MG/0.6ML injection Inject 0.55 mLs (55 mg total) into the skin daily. 06/09/14  Yes Marianne L York, PA-C  LORazepam (ATIVAN) 0.5 MG tablet Take 1 tablet (0.5 mg total) by mouth every 12 (twelve) hours as needed for anxiety. 06/09/14  Yes Marianne L York, PA-C  midodrine (PROAMATINE) 10 MG tablet Take 1 tablet (10 mg total) by mouth 3 (three) times daily with meals. 06/09/14  Yes Marianne L York, PA-C  multivitamin (RENA-VIT) TABS tablet Take 1 tablet by mouth daily.   Yes Historical Provider, MD  nitroGLYCERIN (NITROSTAT) 0.4 MG SL tablet Place 0.4 mg under the tongue every 5 (five) minutes as needed for chest pain.   Yes Historical Provider, MD  Nutritional Supplements (FEEDING SUPPLEMENT, NEPRO CARB STEADY,) LIQD Take 237 mLs by mouth 2 (two) times daily between meals. 06/09/14  Yes Tora KindredMarianne L York, PA-C  sevelamer carbonate (RENVELA) 800 MG tablet Take 1 tablet (800 mg total) by mouth 3 (three) times daily with meals. 06/09/14  Yes Marianne L York, PA-C  warfarin (COUMADIN) 5 MG tablet Take 1 tablet (5 mg total) by mouth daily at 6 PM. 06/09/14  Yes Stephani PoliceMarianne L York, PA-C  HYDROcodone-acetaminophen (NORCO/VICODIN) 5-325 MG per tablet Take 1-2 tablets  by mouth every 4 (four) hours as needed for moderate pain or severe pain. Patient not taking: Reported on 06/04/2014 05/10/14   [name], MD   BP 109/51 mmHg  Pulse 75  Temp(Src) 98.2 F (36.8 C) (Oral)  Resp 20  Wt 118 lb 14.4 oz (53.933 kg)  SpO2 100% Physical Exam  Constitutional: She appears distressed.  HENT:  Head: Normocephalic and atraumatic.  Mouth/Throat: Oropharynx is clear and moist. No oropharyngeal exudate.  Eyes: Conjunctivae and EOM are normal. Pupils are equal, round, and reactive to light.  Neck: Normal range of motion. Neck supple.  No meningismus.  Cardiovascular: Normal rate, regular rhythm, normal heart sounds and intact  distal pulses.   No murmur heard. Pulmonary/Chest: Effort normal. Tachypnea (30s) noted. No respiratory distress. She has decreased breath sounds (at the bases ).  Abdominal: Soft. There is no tenderness. There is no rebound and no guarding.  Musculoskeletal: Normal range of motion. She exhibits no edema or tenderness.  No peripheral edema  Neurological: She is alert. No cranial nerve deficit. She exhibits normal muscle tone. Coordination normal.  No ataxia on finger to nose bilaterally. No pronator drift. 5/5 strength throughout. CN 2-12 intact. Negative Romberg. Equal grip strength. Sensation intact. Gait is normal.   Skin: Skin is warm.  LUE dialysis fistula. Positive thrill and bruit.  Psychiatric: She has a normal mood and affect. Her behavior is normal.  Nursing note and vitals reviewed.   ED Course  Procedures   DIAGNOSTIC STUDIES:  Oxygen Saturation is 100% on RA, normal by my interpretation.    COORDINATION OF CARE:  12:01 AM Discussed treatment plan with pt at bedside and pt agreed to plan.  Labs Review Labs Reviewed  PROTIME-INR - Abnormal; Notable for the following:    Prothrombin Time 32.4 (*)    INR 3.13 (*)    All other components within normal limits  CBC WITH DIFFERENTIAL - Abnormal; Notable for the following:    RBC 3.23 (*)    Hemoglobin 9.6 (*)    HCT 30.8 (*)    RDW 16.3 (*)    Lymphs Abs 4.5 (*)    All other components within normal limits  BASIC METABOLIC PANEL - Abnormal; Notable for the following:    Creatinine, Ser 4.81 (*)    GFR calc non Af Amer 8 (*)    GFR calc Af Amer 9 (*)    Anion gap 19 (*)    All other components within normal limits  URINALYSIS, ROUTINE W REFLEX MICROSCOPIC - Abnormal; Notable for the following:    APPearance TURBID (*)    Hgb urine dipstick LARGE (*)    Protein, ur 100 (*)    Leukocytes, UA LARGE (*)    All other components within normal limits  URINE MICROSCOPIC-ADD ON - Abnormal; Notable for the following:     Bacteria, UA MANY (*)    All other components within normal limits  COMPREHENSIVE METABOLIC PANEL - Abnormal; Notable for the following:    Glucose, Bld 140 (*)    Creatinine, Ser 4.92 (*)    Total Protein 5.9 (*)    Albumin 2.2 (*)    GFR calc non Af Amer 8 (*)    GFR calc Af Amer 9 (*)    Anion gap 17 (*)    All other components within normal limits  CBC WITH DIFFERENTIAL - Abnormal; Notable for the following:    RBC 2.98 (*)    Hemoglobin 8.9 (*)  HCT 28.5 (*)    RDW 16.4 (*)    Neutrophils Relative % 89 (*)    Neutro Abs 8.4 (*)    Lymphocytes Relative 10 (*)    Monocytes Relative 1 (*)    All other components within normal limits  CLOSTRIDIUM DIFFICILE BY PCR  TROPONIN I  CBC  VITAMIN B12  FOLATE RBC  TSH  PROTIME-INR  I-STAT CHEM 8, ED  I-STAT TROPOININ, ED    Imaging Review Dg Chest Portable 1 View  06/13/2014   CLINICAL DATA:  Acute shortness of breath.  Initial encounter.  EXAM: PORTABLE CHEST - 1 VIEW  COMPARISON:  06/04/2014 prior radiographs  FINDINGS: Cardiomegaly and mild left basilar scarring again noted.  There is no evidence of focal airspace disease, pulmonary edema, suspicious pulmonary nodule/mass, pleural effusion, or pneumothorax. No acute bony abnormalities are identified.  IMPRESSION: Cardiomegaly without evidence of active cardiopulmonary disease.   Electronically Signed   By: Laveda Abbe M.D.   On: 06/13/2014 01:06     EKG Interpretation None      MDM   Final diagnoses:  SOB (shortness of breath)   patient woke from sleep with acute onset of shortness of breath. No chest pain, cough or fever. Last dialysis was Tuesday she missed today due to diarrhea. Recent admission for hypotension and UTI. She is scheduled for a procedure of her dialysis fistula tomorrow and her Coumadin has been stopped.   Patient is tachypnea And mild respiratory distress. Her lungs are clear   INR 3.  Doubt PE.   CXR clear.  Per previous discharge summary patient had  similar episode attributed to anxiety. She was given 0.5 mg of Ativan which helped.  Patient feels back to baseline now. She denies any shortness of breath or chest pain. Possible element of anxiety which was seen during her previous hospitalization.  No evidence of volume overload.  Given age and risk factors, will observe. D/w Dr. Toniann Fail. She is scheduled for a shuntogram tomorrow with Dr. Darrick Penna.   Date: 06/13/2014  Rate: 82  Rhythm: normal sinus rhythm  QRS Axis: normal  Intervals: normal  ST/T Wave abnormalities: nonspecific ST/T changes  Conduction Disutrbances:right bundle branch block  Narrative Interpretation:   Old EKG Reviewed: none available    I personally performed the services described in this documentation, which was scribed in my presence. The recorded information has been reviewed and is accurate.   [name] Octave, MD 06/13/14 1754

## 2014-06-13 NOTE — ED Notes (Signed)
Lab called to inquire about urinanalysis.

## 2014-06-13 NOTE — Progress Notes (Signed)
Pt stuck for Hemodialysis without problems. Tx started and pt ran for 15min then it was noted that her arm was rapidly swelling at the venous needle site( baseball sized). Tx was immediately stopped and blood was rinsed back via the arterial needle. Pressure and ice was immediately applied to the infiltration site. Swelling was decreasing with pressure and ice. Radial pulse is weak but palpable. Dr. Bufford SpikesMatting called and informed of the infiltration. States to keep ice on site and run Tx on 12/5.

## 2014-06-13 NOTE — ED Notes (Signed)
MD at bedside. 

## 2014-06-13 NOTE — ED Notes (Signed)
Attempted to give report 

## 2014-06-13 NOTE — Consult Note (Addendum)
[facility] Renal Consultation Note    Indication for Consultation:  Management of ESRD/hemodialysis; anemia, hypertension/volume and secondary hyperparathyroidism PCP:  HPI: [name] is a 80 y.o. female with ESRD (started HD early November) on TTS HD who presented yesterday and had just been d/c Monday after an admission for E Coli UTI s/p antibiotics, new left leg DVT, hypotension on midodrine, AMS with dementia.  History per pt's daughter is that she for the last two nights has not been able to sleep and has been awakening with SOB.  No cough, no distress, no hemoptysis.  Also patient has had deteriorating memory since starting HD. Doesn't like HD, says they treat her "mean".  Has been talking about visits from " angels" and "men in the doorway" asking if she is ready to go.    Chart review: 5/04 - chest pain > normal heart cath, LVEF 45-50%, CKD creat 2.1, anemia, hx asthma 10/07 - acute/chronic renal failure d/t low BP/ ACEi, HTN, CKD 3/08 - abd/ back pain / fever / dysuria > EColi UTI, acute on CKD resolved, f/u OP with renal MD 11/25 - 06/09/14 > SOB and AMS x 1 week > just recently started HD, had 3 sessions. In ED pt with low BP/s, no fever. UA dirty > admitted , dx was SOB d/t anxiety vs small PE. LE doppler +for DVT. No RV strain on echo. Clinically stable w/o hypoxemia so VQ/ CT angio were not done. Rx hep/ coumadin for DVT, f/u PCP. EColi UTI rx abx. AMS head CT neg, felt underlying dementia c/b UTI, etc.   Past Medical History  Diagnosis Date  . Gout   . Renal insufficiency   . Hypertension   . Hyperlipemia    Past Surgical History  Procedure Laterality Date  . Tubal ligation    . Av fistula placement     Family History  Problem Relation Age of Onset  . Colon cancer Neg Hx   . Diabetes Mellitus II Neg Hx    Social History:  reports that she has never smoked. She has never used smokeless tobacco. She reports that she does not drink alcohol or use  illicit drugs. Allergies  Allergen Reactions  . Nsaids     REACTION: Avoid NSAIDS(renal failure)   Prior to Admission medications   Medication Sig Start Date End Date Taking? Authorizing Provider  albuterol (PROAIR HFA) 108 (90 BASE) MCG/ACT inhaler Inhale 1 puff into the lungs at bedtime.   Yes Historical Provider, MD  cinacalcet (SENSIPAR) 30 MG tablet Take 1 tablet (30 mg total) by mouth daily with breakfast. 06/09/14  Yes Tora KindredMarianne L York, PA-C  enoxaparin (LOVENOX) 60 MG/0.6ML injection Inject 0.55 mLs (55 mg total) into the skin daily. 06/09/14  Yes Marianne L York, PA-C  LORazepam (ATIVAN) 0.5 MG tablet Take 1 tablet (0.5 mg total) by mouth every 12 (twelve) hours as needed for anxiety. 06/09/14  Yes Marianne L York, PA-C  midodrine (PROAMATINE) 10 MG tablet Take 1 tablet (10 mg total) by mouth 3 (three) times daily with meals. 06/09/14  Yes Marianne L York, PA-C  multivitamin (RENA-VIT) TABS tablet Take 1 tablet by mouth daily.   Yes Historical Provider, MD  nitroGLYCERIN (NITROSTAT) 0.4 MG SL tablet Place 0.4 mg under the tongue every 5 (five) minutes as needed for chest pain.   Yes Historical Provider, MD  Nutritional Supplements (FEEDING SUPPLEMENT, NEPRO CARB STEADY,) LIQD Take 237 mLs by mouth 2 (two) times daily between meals. 06/09/14  Yes Stephani Police, PA-C  sevelamer carbonate (RENVELA) 800 MG tablet Take 1 tablet (800 mg total) by mouth 3 (three) times daily with meals. 06/09/14  Yes [name], PA-C  warfarin (COUMADIN) 5 MG tablet Take 1 tablet (5 mg total) by mouth daily at 6 PM. 06/09/14  Yes Stephani Police, PA-C  HYDROcodone-acetaminophen (NORCO/VICODIN) 5-325 MG per tablet Take 1-2 tablets by mouth every 4 (four) hours as needed for moderate pain or severe pain. Patient not taking: Reported on 06/04/2014 05/10/14   Olivia Mackie, MD   Current Facility-Administered Medications  Medication Dose Route Frequency Provider Last Rate Last Dose  . acetaminophen (TYLENOL)  tablet 650 mg  650 mg Oral Q6H PRN Eduard Clos, MD       Or  . acetaminophen (TYLENOL) suppository 650 mg  650 mg Rectal Q6H PRN Eduard Clos, MD      . albuterol (PROVENTIL) (2.5 MG/3ML) 0.083% nebulizer solution 3 mL  3 mL Inhalation QHS Eduard Clos, MD      . cefTRIAXone (ROCEPHIN) 1 g in dextrose 5 % 50 mL IVPB - Premix  1 g Intravenous Q24H Eduard Clos, MD   1 g at 06/13/14 0554  . cinacalcet (SENSIPAR) tablet 30 mg  30 mg Oral Q breakfast Eduard Clos, MD   30 mg at 06/13/14 1200  . feeding supplement (NEPRO CARB STEADY) liquid 237 mL  237 mL Oral BID BM Eduard Clos, MD   237 mL at 06/13/14 1330  . HYDROcodone-acetaminophen (NORCO/VICODIN) 5-325 MG per tablet 1-2 tablet  1-2 tablet Oral Q4H PRN Eduard Clos, MD      . LORazepam (ATIVAN) tablet 0.5 mg  0.5 mg Oral Q12H PRN Eduard Clos, MD      . midodrine (PROAMATINE) tablet 10 mg  10 mg Oral TID WC Eduard Clos, MD   10 mg at 06/13/14 1300  . multivitamin (RENA-VIT) tablet 1 tablet  1 tablet Oral Daily Eduard Clos, MD   1 tablet at 06/13/14 1300  . nitroGLYCERIN (NITROSTAT) SL tablet 0.4 mg  0.4 mg Sublingual Q5 min PRN Eduard Clos, MD      . ondansetron [facility]) tablet 4 mg  4 mg Oral Q6H PRN Eduard Clos, MD       Or  . ondansetron [facility]) injection 4 mg  4 mg Intravenous Q6H PRN Eduard Clos, MD      . sevelamer carbonate (RENVELA) tablet 800 mg  800 mg Oral TID WC Eduard Clos, MD   800 mg at 06/13/14 1300  . sodium chloride 0.9 % injection 3 mL  3 mL Intravenous Q12H Eduard Clos, MD       Labs: Basic Metabolic Panel:  Recent Labs Lab 06/07/14 1107 06/09/14 0440 06/13/14 0037 06/13/14 0600  NA 135* 138 138 138  K 3.8 4.1 3.7 4.0  CL 97 100 96 99  CO2 25 24 23 22   GLUCOSE 72 66* 72 140*  BUN 21 13 20 22   CREATININE 4.18* 3.44* 4.81* 4.92*  CALCIUM 8.3* 9.0 8.8 8.5  PHOS 2.5 2.9  --   --    Liver Function  Tests:  Recent Labs Lab 06/07/14 1107 06/09/14 0440 06/13/14 0600  AST  --   --  19  ALT  --   --  9  ALKPHOS  --   --  58  BILITOT  --   --  0.3  PROT  --   --  5.9*  ALBUMIN 2.1* 1.9* 2.2*  CBC:  Recent Labs Lab 06/07/14 1107 06/08/14 0415 06/09/14 0440 06/13/14 0037 06/13/14 0600  WBC 6.6 7.5 8.2 10.1 9.4  NEUTROABS  --   --   --  5.0 8.4*  HGB 8.5* 8.6* 8.3* 9.6* 8.9*  HCT 27.5* 29.0* 27.3* 30.8* 28.5*  MCV 92.0 94.5 96.1 95.4 95.6  PLT 181 234 239 217 207   Cardiac Enzymes:  Recent Labs Lab 06/13/14 0600  TROPONINI <0.30  Studies/Results: Dg Chest Portable 1 View  06/13/2014   CLINICAL DATA:  Acute shortness of breath.  Initial encounter.  EXAM: PORTABLE CHEST - 1 VIEW  COMPARISON:  06/04/2014 prior radiographs  FINDINGS: Cardiomegaly and mild left basilar scarring again noted.  There is no evidence of focal airspace disease, pulmonary edema, suspicious pulmonary nodule/mass, pleural effusion, or pneumothorax. No acute bony abnormalities are identified.  IMPRESSION: Cardiomegaly without evidence of active cardiopulmonary disease.   Electronically Signed   By: Laveda AbbeJeff  Hu M.D.   On: 06/13/2014 01:06    ROS: As per HPI otherwise negative. Physical Exam: Filed Vitals:   06/13/14 0117 06/13/14 0200 06/13/14 0328 06/13/14 0506  BP:  97/51 102/58   Pulse:  93 82   Temp: 98.7 F (37.1 C)  97.8 F (36.6 C)   TempSrc: Rectal  Oral   Resp:  25 20   Weight:    53.933 kg (118 lb 14.4 oz)  SpO2:  99% 98%      General: Well developed, well nourished, in no acute distress Neck: Supple. JVD not elevated. Lungs: Clear bilaterally to auscultation without wheezes, rales, or rhonchi, no distress Heart: RRR with S1 S2. 2/6 SEM Abdomen: Soft, non-tender, non-distended with normoactive bowel sounds. No rebound/guarding. No obvious abdominal masses. M-S:  Strength and tone appear normal for age. Lower extremities: without edema or ischemic changes, no open wounds. Good  pulses Neuro: Alert and oriented X 3. Moves all extremities spontaneously. Psych:  Responds to questions appropriately with a normal affect. Dialysis Access: left AVF  Dialysis Orders: Center: [facility] TTS 4 hr 56 kg std heparin 4 K 2.25 Ca 400/800 left AVF calcitriol 0.5 on hold Aranesp 40 q Sat venofer 100 through 12/10  Assessment/Plan: 1.  SOB - unclear cause, not hypoxic, normal CXR and no vol excess.  Will d/w primary consider V/Q or CT angio r/o PE.  2.  ESRD -  TTS - missed HD yesterday - plan for today; she had been scheduled for shuntogram today with possible PTA- seen by Dr. Darrick PennaFields, who recommended she be converted to heparin for DVT for now 3.  Chronic hypotension/volume  - midodrine started last admission, Echo neg percard effusion 4.  Anemia  - Hgb 8.9  - continue Aranesp and IV Fe 5.  Metabolic bone disease -  Calcitriol held after last admssion due to ^ corrected Ca 6.  Nutrition - renal diet  7.  New DVT 05/2014 - on coumadin 8. EOL - have d/w daughter, Kenney Housemananya, they do not want to her to suffer CPR/ ACLS , heroic measures, and agree to DNR status. Also she notes that that memory has continued to declined since starting HD, she has been talking about visits from "angels" and others come to see if she is "ready to go".  Check TSH, B12. CT head neg already. Suspect she will continue to decline. We will continue HD as long as pt/ family want to and pt can tolerate.     Sheffield SliderMartha B Bergman,  PA-C [employer] [phone] 06/13/2014, 2:00 PM   Pt seen, examined and agree w A/P as above.  Vinson Moselle MD pager [phone]    cell [phone] 06/13/2014, 3:37 PM

## 2014-06-13 NOTE — Progress Notes (Signed)
ANTICOAGULATION CONSULT NOTE - Follow Up Consult  Pharmacy Consult for: Heparin when INR <2 Indication: DVT hx  Allergies  Allergen Reactions  . Nsaids     REACTION: Avoid NSAIDS(renal failure)    Patient Measurements: Weight: 118 lb 14.4 oz (53.933 kg)  Height 5'  IBW 45.5 kg Heparin Dosing Weight: 53.9 kg  Vital Signs: Temp: 97.8 F (36.6 C) (12/04 0328) Temp Source: Oral (12/04 0328) BP: 102/58 mmHg (12/04 0328) Pulse Rate: 82 (12/04 0328)  Labs:  Recent Labs  06/13/14 0037 06/13/14 0600  HGB 9.6* 8.9*  HCT 30.8* 28.5*  PLT 217 207  LABPROT 32.4*  --   INR 3.13*  --   CREATININE 4.81* 4.92*  TROPONINI  --  <0.30    Estimated Creatinine Clearance: 6.7 mL/min (by C-G formula based on Cr of 4.92).  Assessment: 80 year old female with RLE DVT on chronic Coumadin now with plans for shuntogram next week. Coumadin is on hold. Pharmacy consulted to dose IV Heparin when INR <2 (confirmed per Dr. Catha GosselinMikhail). INR today is supra-therapeutic at 3.13. H/H is low and trending donw. Platelets are stable. No bleeding reported.   Goal of Therapy:  Heparin level 0.3-0.7 units/ml Monitor platelets by anticoagulation protocol: Yes   Plan:  No heparin today due to elevated INR.  F/up to start when INR <2.   Link SnufferJessica Shalae Belmonte, PharmD, BCPS Clinical Pharmacist [phone] 06/13/2014,1:21 PM

## 2014-06-13 NOTE — Progress Notes (Signed)
[facility]                                                                              Patient Demographics  [name], is a 80 y.o. female, DOB - 1933/05/22, ZOX:[id number]  Admit date - 06/12/2014   Admitting Physician Sherren Kerns, MD  Outpatient Primary MD for the patient is Dorrene German, MD  LOS - 1   Chief Complaint  Patient presents with  . Shortness of Breath      HPI on 06/13/2014 by Dr. Midge Minium [name] is a 80 y.o. female with a history of ESRD on hemodialysis on Tuesday Thursday and Saturday was brought to the ER after patient was complaining of shortness of breath. As per the family patient did not have dialysis yesterday because patient had multiple episodes of diarrhea. Patient was recently admitted to the hospital and was diagnosed with right lower extremity DVT and was placed on Coumadin. Patient is only to have a shuntogram done today by Dr. Darrick Penna was supposed to be off Coumadin last 3 days but has taken it until yesterday. Patient's INR is subtherapeutic. On exam patient initially was tachypneic and tachycardic and chest x-ray was not showing any definite congestion. Patient's breathing improved with 1 dose of Ativan. But since patient is due for dialysis and there may be a complement of possible mild fluid overload patient has been admitted for observation and get dialysis in the morning. Patient denies any chest pain productive cough fever chills. Patient at the time of discharge was having constipation and patient's daughter did give some laxatives following which patient has had at least 4 episodes of diarrhea. Denies any abdominal pain.  Assessment & Plan   Patient admitted earlier this morning.  Agree with assessment and plan by Dr. Toniann Fail.  Dyspnea -Likely multifactorial including missed dialysis with volume overload versus anxiety -Has improved since admission -CXR: Cardiomegaly without evidence of active  cardiopulmonary disease -Will continue to monitor  Diarrhea -Per patient, has resolved -Possibly secondary to laxative -C. difficile PCR pending  Urinary tract infection -UA: WBC TNTC, many bacteria, large leukocytes -Continue Rocephin -Pending urine culture  ESRD on hemodialysis -Patient dialyzes on Tuesday, Thursday, Saturday -Will consult nephrology -Patient is scheduled for shuntogram with Dr. Darrick Penna today however this has been canceled. -Spoke with Dr. Darrick Penna, patient will be reevaluated and assessed for possible shuntogram on Monday, would like patient on heparin drip -Continue Nepro, Sensipar, Rena-Vite  Asthma -Currently stable, patient is not coughing or wheezing at this time  Chronic anemia -Likely secondary to ESRD -Will continue to monitor CBC  Lower extremity DVT with supratherapeutic INR -Coumadin held -Will place patient on heparin as requested by Dr. Darrick Penna  Hypotension -Continue Midodrine  Code Status: Full  Family Communication: None at bedside  Disposition Plan: Admitted  Time Spent in minutes   30 minutes  Procedures  None  Consults   Nephrology Vascular Surgery, Dr. Darrick Penna  DVT Prophylaxis  Heparin when INR <2, currently supratherapeutic INR  Lab Results  Component Value Date   PLT 207 06/13/2014    Medications  Scheduled Meds: . albuterol  3 mL Inhalation QHS  . cefTRIAXone (ROCEPHIN)  IV  1 g Intravenous Q24H  . cinacalcet  30 mg Oral Q breakfast  . feeding supplement (NEPRO CARB STEADY)  237 mL Oral BID BM  . midodrine  10 mg Oral TID WC  . multivitamin  1 tablet Oral Daily  . sevelamer carbonate  800 mg Oral TID WC  . sodium chloride  3 mL Intravenous Q12H   Continuous Infusions:  PRN Meds:.acetaminophen **OR** acetaminophen, HYDROcodone-acetaminophen, LORazepam, nitroGLYCERIN, ondansetron **OR** ondansetron (ZOFRAN) IV  Antibiotics   Anti-infectives    Start     Dose/Rate Route Frequency Ordered Stop   06/13/14 0500   cefTRIAXone (ROCEPHIN) 1 g in dextrose 5 % 50 mL IVPB - Premix     1 g100 mL/hr over 30 Minutes Intravenous Every 24 hours 06/13/14 0437          Subjective:   Josefa Buerkle seen and examined today.   She has no specific complaints.  Objective:   Filed Vitals:   06/13/14 0117 06/13/14 0200 06/13/14 0328 06/13/14 0506  BP:  97/51 102/58   Pulse:  93 82   Temp: 98.7 F (37.1 C)  97.8 F (36.6 C)   TempSrc: Rectal  Oral   Resp:  25 20   Weight:    53.933 kg (118 lb 14.4 oz)  SpO2:  99% 98%     Wt Readings from Last 3 Encounters:  06/13/14 53.933 kg (118 lb 14.4 oz)  06/09/14 55.6 kg (122 lb 9.2 oz)  04/10/14 56.7 kg (125 lb)     Intake/Output Summary (Last 24 hours) at 06/13/14 1254 Last data filed at 06/13/14 [id number]  Gross per 24 hour  Intake    560 ml  Output      0 ml  Net    560 ml    Exam  General: Well developed, well nourished, NAD, appears stated age  HEENT: NCAT, mucous membranes moist.   Cardiovascular: S1 S2 auscultated  Respiratory: Clear to auscultation bilaterally with equal chest rise  Abdomen: Soft, nontender, nondistended, + bowel sounds  Extremities: warm dry without cyanosis clubbing or edema, LUE AVF  Neuro: Awake and alert, oriented x 2.  Able to move all extremities.   Data Review   Micro Results Recent Results (from the past 240 hour(s))  Urine culture     Status: None   Collection Time: 06/05/14 12:14 AM  Result Value Ref Range Status   Specimen Description URINE, CATHETERIZED  Final   Special Requests NONE  Final   Culture  Setup Time   Final    06/05/2014 05:41 Performed at [facility]    Colony Count   Final    30,000 COLONIES/ML Performed at [facility]    Culture   Final    ESCHERICHIA COLI Performed at [facility]    Report Status 06/07/2014 FINAL  Final   Organism ID, Bacteria ESCHERICHIA COLI  Final      Susceptibility   Escherichia coli - MIC*    AMPICILLIN 16 INTERMEDIATE  Intermediate     CEFAZOLIN <=4 SENSITIVE Sensitive     CEFTRIAXONE <=1 SENSITIVE Sensitive     CIPROFLOXACIN <=0.25 SENSITIVE Sensitive     GENTAMICIN <=1 SENSITIVE Sensitive     LEVOFLOXACIN <=0.12 SENSITIVE Sensitive     NITROFURANTOIN <=16 SENSITIVE Sensitive     TOBRAMYCIN <=1 SENSITIVE Sensitive     TRIMETH/SULFA <=20 SENSITIVE Sensitive     PIP/TAZO <=4 SENSITIVE Sensitive     * ESCHERICHIA COLI  MRSA PCR  Screening     Status: None   Collection Time: 06/05/14  2:09 AM  Result Value Ref Range Status   MRSA by PCR NEGATIVE NEGATIVE Final    Comment:        The GeneXpert MRSA Assay (FDA approved for NASAL specimens only), is one component of a comprehensive MRSA colonization surveillance program. It is not intended to diagnose MRSA infection nor to guide or monitor treatment for MRSA infections.     Radiology Reports Ct Head Wo Contrast  06/05/2014   CLINICAL DATA:  Intermittent confusion associated with hypertension.  EXAM: CT HEAD WITHOUT CONTRAST  TECHNIQUE: Contiguous axial images were obtained from the base of the skull through the vertex without intravenous contrast.  COMPARISON:  None.  FINDINGS: Skull and Sinuses:Negative for fracture or destructive process. The mastoids, middle ears, and imaged paranasal sinuses are clear.  Orbits: No acute abnormality.  Brain: No evidence of acute abnormality, such as acute infarction, hemorrhage, hydrocephalus, or mass lesion/mass effect. There is generalized volume loss consistent with atrophy. Mild chronic small vessel disease with ischemic gliosis noted around the frontal horn of the left lateral ventricle.  IMPRESSION: 1. No acute intracranial disease. 2. Brain atrophy and mild chronic small vessel ischemia.   Electronically Signed   By: Tiburcio Pea M.D.   On: 06/05/2014 03:17   Dg Chest Portable 1 View  06/13/2014   CLINICAL DATA:  Acute shortness of breath.  Initial encounter.  EXAM: PORTABLE CHEST - 1 VIEW  COMPARISON:   06/04/2014 prior radiographs  FINDINGS: Cardiomegaly and mild left basilar scarring again noted.  There is no evidence of focal airspace disease, pulmonary edema, suspicious pulmonary nodule/mass, pleural effusion, or pneumothorax. No acute bony abnormalities are identified.  IMPRESSION: Cardiomegaly without evidence of active cardiopulmonary disease.   Electronically Signed   By: Laveda Abbe M.D.   On: 06/13/2014 01:06   Dg Chest Port 1 View  06/04/2014   CLINICAL DATA:  Asthma and hypertension.  Shortness of breath  EXAM: PORTABLE CHEST - 1 VIEW  COMPARISON:  05/10/2014  FINDINGS: Normal heart size. No pleural effusion or edema. Lung volumes are low. No airspace consolidation. Coarsened interstitial markings are identified bilaterally.  IMPRESSION: 1. Low lung volumes. 2. No acute findings noted.   Electronically Signed   By: Signa Kell M.D.   On: 06/04/2014 23:45    CBC  Recent Labs Lab 06/07/14 1107 06/08/14 0415 06/09/14 0440 06/13/14 0037 06/13/14 0600  WBC 6.6 7.5 8.2 10.1 9.4  HGB 8.5* 8.6* 8.3* 9.6* 8.9*  HCT 27.5* 29.0* 27.3* 30.8* 28.5*  PLT 181 234 239 217 207  MCV 92.0 94.5 96.1 95.4 95.6  MCH 28.4 28.0 29.2 29.7 29.9  MCHC 30.9 29.7* 30.4 31.2 31.2  RDW 15.2 15.5 15.5 16.3* 16.4*  LYMPHSABS  --   --   --  4.5* 0.9  MONOABS  --   --   --  0.5 0.1  EOSABS  --   --   --  0.0 0.0  BASOSABS  --   --   --  0.0 0.0    Chemistries   Recent Labs Lab 06/07/14 1107 06/09/14 0440 06/13/14 0037 06/13/14 0600  NA 135* 138 138 138  K 3.8 4.1 3.7 4.0  CL 97 100 96 99  CO2 25 24 23 22   GLUCOSE 72 66* 72 140*  BUN 21 13 20 22   CREATININE 4.18* 3.44* 4.81* 4.92*  CALCIUM 8.3* 9.0 8.8 8.5  AST  --   --   --  19  ALT  --   --   --  9  ALKPHOS  --   --   --  58  BILITOT  --   --   --  0.3   ------------------------------------------------------------------------------------------------------------------ estimated creatinine clearance is 6.7 mL/min (by C-G formula based on  Cr of 4.92). ------------------------------------------------------------------------------------------------------------------ No results for input(s): HGBA1C in the last 72 hours. ------------------------------------------------------------------------------------------------------------------ No results for input(s): CHOL, HDL, LDLCALC, TRIG, CHOLHDL, LDLDIRECT in the last 72 hours. ------------------------------------------------------------------------------------------------------------------ No results for input(s): TSH, T4TOTAL, T3FREE, THYROIDAB in the last 72 hours.  Invalid input(s): FREET3 ------------------------------------------------------------------------------------------------------------------ No results for input(s): VITAMINB12, FOLATE, FERRITIN, TIBC, IRON, RETICCTPCT in the last 72 hours.  Coagulation profile  Recent Labs Lab 06/07/14 0924 06/08/14 0415 06/09/14 0440 06/13/14 0037  INR 1.11 1.29 1.45 3.13*    No results for input(s): DDIMER in the last 72 hours.  Cardiac Enzymes  Recent Labs Lab 06/13/14 0600  TROPONINI <0.30   ------------------------------------------------------------------------------------------------------------------ Invalid input(s): POCBNP    Amara Justen D.O. on 06/13/2014 at 12:54 PM  Between 7am to 7pm - Pager - [phone]  After 7pm go to [email or web address] - password [email or web address]  And look for the night coverage person covering for me after hours  [facility] Office  [phone]

## 2014-06-13 NOTE — Progress Notes (Signed)
ANTICOAGULATION CONSULT NOTE - Initial Consult  Pharmacy Consult for lovenox when INR<2 Indication: DVT hx  Allergies  Allergen Reactions  . Nsaids     REACTION: Avoid NSAIDS(renal failure)    Patient Measurements:   Heparin Dosing Weight:   Vital Signs: Temp: 97.8 F (36.6 C) (12/04 0328) Temp Source: Oral (12/04 0328) BP: 102/58 mmHg (12/04 0328) Pulse Rate: 82 (12/04 0328)  Labs:  Recent Labs  06/13/14 0037  HGB 9.6*  HCT 30.8*  PLT 217  LABPROT 32.4*  INR 3.13*  CREATININE 4.81*    Estimated Creatinine Clearance: 7.4 mL/min (by C-G formula based on Cr of 4.81).   Medical History: Past Medical History  Diagnosis Date  . Gout   . Renal insufficiency   . Hypertension   . Hyperlipemia     Medications:  Prescriptions prior to admission  Medication Sig Dispense Refill Last Dose  . albuterol (PROAIR HFA) 108 (90 BASE) MCG/ACT inhaler Inhale 1 puff into the lungs at bedtime.   Past Week at Unknown time  . cinacalcet (SENSIPAR) 30 MG tablet Take 1 tablet (30 mg total) by mouth daily with breakfast. 30 tablet 3 06/12/2014 at Unknown time  . enoxaparin (LOVENOX) 60 MG/0.6ML injection Inject 0.55 mLs (55 mg total) into the skin daily. 5 Syringe 0 06/11/2014 at Unknown time  . LORazepam (ATIVAN) 0.5 MG tablet Take 1 tablet (0.5 mg total) by mouth every 12 (twelve) hours as needed for anxiety. 30 tablet 0 06/12/2014 at Unknown time  . midodrine (PROAMATINE) 10 MG tablet Take 1 tablet (10 mg total) by mouth 3 (three) times daily with meals. 90 tablet 3 06/12/2014 at Unknown time  . multivitamin (RENA-VIT) TABS tablet Take 1 tablet by mouth daily.   06/12/2014 at Unknown time  . nitroGLYCERIN (NITROSTAT) 0.4 MG SL tablet Place 0.4 mg under the tongue every 5 (five) minutes as needed for chest pain.   unknown  . Nutritional Supplements (FEEDING SUPPLEMENT, NEPRO CARB STEADY,) LIQD Take 237 mLs by mouth 2 (two) times daily between meals. 60 Can 3 06/12/2014 at Unknown time  .  sevelamer carbonate (RENVELA) 800 MG tablet Take 1 tablet (800 mg total) by mouth 3 (three) times daily with meals. 90 tablet 3 06/12/2014 at Unknown time  . warfarin (COUMADIN) 5 MG tablet Take 1 tablet (5 mg total) by mouth daily at 6 PM. 30 tablet 3 06/10/2014 at Unknown time  . HYDROcodone-acetaminophen (NORCO/VICODIN) 5-325 MG per tablet Take 1-2 tablets by mouth every 4 (four) hours as needed for moderate pain or severe pain. (Patient not taking: Reported on 06/04/2014) 30 tablet 0 Not Taking at Unknown time    Assessment: 80 yo with adx for sob. INR therapeutic at 3.13. Due for a procedure for dialysis access as noted in ED physician's note. Lovenox to begin when INR<2. Will f/u INR in am.    Goal of Therapy:  Renal adjust lovenox    Plan:  F/u INR in am 12/5 Begin lovenox 60mg  q24 h if INR <2  Janice CoffinEarl, Annaliese Saez Jonathan 06/13/2014,4:54 AM

## 2014-06-13 NOTE — Progress Notes (Signed)
INITIAL NUTRITION ASSESSMENT  Pt meets criteria for SEVERE MALNUTRITION in the context of chronic illness as evidenced by severe fat and muscle mass loss.  DOCUMENTATION CODES Per approved criteria  -Severe malnutrition in the context of chronic illness   INTERVENTION: Continue providing Nepro Shake po BID, each supplement provides 425 kcal and 19 grams protein  Encourage PO intake.  NUTRITION DIAGNOSIS: Increased nutrient needs related to chronic illness, ESRD as evidenced by estimated nutrition needs.   Goal: Pt to meet >/= 90% of their estimated nutrition needs   Monitor:  PO intake, weight trends, labs, I/O's  Reason for Assessment: MST  80 y.o. female  Admitting Dx: SOB (shortness of breath)  ASSESSMENT: Pt with a history of ESRD on hemodialysis on Tuesday Thursday and Saturday was brought to the ER after patient was complaining of shortness of breath. On exam patient initially was tachypneic and tachycardic and chest x-ray was not showing any definite congestion.  Pt reports having a good appetite currently and prior to admission eating 3 full meals a day. Pt reports she has been drinking her Nepro Shakes. Will continue with current orders. Per Epic weight records, pt with a 23% weight loss in 14 months. Pt was encouraged to eat her food at meals and to drink her supplements.  Nutrition Focused Physical Exam:  Subcutaneous Fat:  Orbital Region: N/A Upper Arm Region: Severe depletion Thoracic and Lumbar Region: WNL  Muscle:  Temple Region: N/A Clavicle Bone Region: Severe depletion Clavicle and Acromion Bone Region: Severe depletion Scapular Bone Region: N/A Dorsal Hand: N/A Patellar Region: WNL Anterior Thigh Region: WNL Posterior Calf Region: WNL  Edema: none  Height: Ht Readings from Last 1 Encounters:  06/06/14 5' (1.524 m)    Weight: Wt Readings from Last 1 Encounters:  06/13/14 118 lb 14.4 oz (53.933 kg)    Ideal Body Weight: 100 lbs  % Ideal  Body Weight: 118%  Wt Readings from Last 10 Encounters:  06/13/14 118 lb 14.4 oz (53.933 kg)  06/09/14 122 lb 9.2 oz (55.6 kg)  04/10/14 125 lb (56.7 kg)  05/07/13 151 lb (68.493 kg)  03/13/13 154 lb (69.854 kg)  11/14/11 180 lb (81.647 kg)  11/08/11 180 lb (81.647 kg)  09/08/08 208 lb (94.348 kg)  04/29/08 209 lb (94.802 kg)  11/21/07 213 lb (96.616 kg)    Usual Body Weight: 154 lbs  % Usual Body Weight: 77%  BMI:  Body mass index is 23.22 kg/(m^2).  Estimated Nutritional Needs: Kcal: 1700-1900 Protein: 80-95 grams Fluid: 1.2 L/day  Skin: Stage II pressure ulcer on sacrum  Diet Order: Diet renal W/122400mL fluid restriction  EDUCATION NEEDS: -No education needs identified at this time   Intake/Output Summary (Last 24 hours) at 06/13/14 1055 Last data filed at 06/13/14 0332  Gross per 24 hour  Intake    560 ml  Output      0 ml  Net    560 ml    Last BM: 11/29  Labs:   Recent Labs Lab 06/07/14 1107 06/09/14 0440 06/13/14 0037 06/13/14 0600  NA 135* 138 138 138  K 3.8 4.1 3.7 4.0  CL 97 100 96 99  CO2 25 24 23 22   BUN 21 13 20 22   CREATININE 4.18* 3.44* 4.81* 4.92*  CALCIUM 8.3* 9.0 8.8 8.5  PHOS 2.5 2.9  --   --   GLUCOSE 72 66* 72 140*    CBG (last 3)  No results for input(s): GLUCAP in the last 72  hours.  Scheduled Meds: . albuterol  3 mL Inhalation QHS  . cefTRIAXone (ROCEPHIN)  IV  1 g Intravenous Q24H  . cinacalcet  30 mg Oral Q breakfast  . feeding supplement (NEPRO CARB STEADY)  237 mL Oral BID BM  . midodrine  10 mg Oral TID WC  . multivitamin  1 tablet Oral Daily  . sevelamer carbonate  800 mg Oral TID WC  . sodium chloride  3 mL Intravenous Q12H    Continuous Infusions:   Past Medical History  Diagnosis Date  . Gout   . Renal insufficiency   . Hypertension   . Hyperlipemia     Past Surgical History  Procedure Laterality Date  . Tubal ligation    . Av fistula placement      Marijean NiemannStephanie La, MS, RD, LDN Pager #  [phone] After hours/ weekend pager # [phone]

## 2014-06-13 NOTE — H&P (Signed)
[facility] History and Physical  [name] ZOX:[id number] DOB: July 11, 1933 DOA: 06/12/2014  Referring physician: ER physician ER physician  Dorrene German, MD  Chief Complaint: Shortness of breath.  HPI: [name] is a 80 y.o. female  with a history of ESRD on hemodialysis on Tuesday Thursday and Saturday was brought to the ER after patient was complaining of shortness of breath. As per the family patient did not have dialysis yesterday because patient had multiple episodes of diarrhea. Patient was recently admitted to the hospital and was diagnosed with right lower extremity DVT and was placed on Coumadin. Patient is only to have a shuntogram done today by Dr. Darrick Penna was supposed to be off Coumadin last 3 days but has taken it until yesterday. Patient's INR is subtherapeutic. On exam patient initially was tachypneic and tachycardic and chest x-ray was not showing any definite congestion. Patient's breathing improved with 1 dose of Ativan. But since patient is due for dialysis and there may be a complement of possible mild fluid overload patient has been admitted for observation and get dialysis in the morning. Patient denies any chest pain productive cough fever chills. Patient at the time of discharge was having constipation and patient's daughter did give some laxatives following which patient has had at least 4 episodes of diarrhea. Denies any abdominal pain.   Review of Systems: As presented in the history of presenting illness, rest negative.  Past Medical History  Diagnosis Date  . Gout   . Renal insufficiency   . Hypertension   . Hyperlipemia    Past Surgical History  Procedure Laterality Date  . Tubal ligation    . Av fistula placement     Social History:  reports that she has never smoked. She has never used smokeless tobacco. She reports that she does not drink alcohol or use illicit drugs. Where does patient live home. Can patient participate in ADLs?  Yes.  Allergies  Allergen Reactions  . Nsaids     REACTION: Avoid NSAIDS(renal failure)    Family History:  Family History  Problem Relation Age of Onset  . Colon cancer Neg Hx   . Diabetes Mellitus II Neg Hx       Prior to Admission medications   Medication Sig Start Date End Date Taking? Authorizing Provider  albuterol (PROAIR HFA) 108 (90 BASE) MCG/ACT inhaler Inhale 1 puff into the lungs at bedtime.   Yes Historical Provider, MD  cinacalcet (SENSIPAR) 30 MG tablet Take 1 tablet (30 mg total) by mouth daily with breakfast. 06/09/14  Yes [name], PA-C  enoxaparin (LOVENOX) 60 MG/0.6ML injection Inject 0.55 mLs (55 mg total) into the skin daily. 06/09/14  Yes Marianne L York, PA-C  LORazepam (ATIVAN) 0.5 MG tablet Take 1 tablet (0.5 mg total) by mouth every 12 (twelve) hours as needed for anxiety. 06/09/14  Yes Marianne L York, PA-C  midodrine (PROAMATINE) 10 MG tablet Take 1 tablet (10 mg total) by mouth 3 (three) times daily with meals. 06/09/14  Yes Marianne L York, PA-C  multivitamin (RENA-VIT) TABS tablet Take 1 tablet by mouth daily.   Yes Historical Provider, MD  nitroGLYCERIN (NITROSTAT) 0.4 MG SL tablet Place 0.4 mg under the tongue every 5 (five) minutes as needed for chest pain.   Yes Historical Provider, MD  Nutritional Supplements (FEEDING SUPPLEMENT, NEPRO CARB STEADY,) LIQD Take 237 mLs by mouth 2 (two) times daily between meals. 06/09/14  Yes Marianne L York, PA-C  sevelamer carbonate (RENVELA) 800  MG tablet Take 1 tablet (800 mg total) by mouth 3 (three) times daily with meals. 06/09/14  Yes Tora KindredMarianne L York, PA-C  warfarin (COUMADIN) 5 MG tablet Take 1 tablet (5 mg total) by mouth daily at 6 PM. 06/09/14  Yes Stephani PoliceMarianne L York, PA-C  HYDROcodone-acetaminophen (NORCO/VICODIN) 5-325 MG per tablet Take 1-2 tablets by mouth every 4 (four) hours as needed for moderate pain or severe pain. Patient not taking: Reported on 06/04/2014 05/10/14   [name], MD     Physical Exam: Filed Vitals:   06/13/14 0115 06/13/14 0117 06/13/14 0200 06/13/14 0328  BP: 125/56  97/51 102/58  Pulse: 90  93 82  Temp:  98.7 F (37.1 C)  97.8 F (36.6 C)  TempSrc:  Rectal  Oral  Resp: 16  25 20   SpO2: 98%  99% 98%     General:  Moderately built and poorly nourished.  Eyes: Anicteric no pallor.  ENT: No discharge from the ears eyes nose mouth.  Neck: No mass felt.  Cardiovascular: S1-S2 heard.  Respiratory: No rhonchi or crepitations.  Abdomen: Soft nontender bowel sounds present.  Skin: No rash.  Musculoskeletal: No edema.  Psychiatric: Patient is oriented to her name and follows commands.  Neurologic: Patient is oriented to her name and follows commands and moves all extremities.  Labs on Admission:  Basic Metabolic Panel:  Recent Labs Lab 06/07/14 1107 06/09/14 0440 06/13/14 0037  NA 135* 138 138  K 3.8 4.1 3.7  CL 97 100 96  CO2 25 24 23   GLUCOSE 72 66* 72  BUN 21 13 20   CREATININE 4.18* 3.44* 4.81*  CALCIUM 8.3* 9.0 8.8  PHOS 2.5 2.9  --    Liver Function Tests:  Recent Labs Lab 06/07/14 1107 06/09/14 0440  ALBUMIN 2.1* 1.9*   No results for input(s): LIPASE, AMYLASE in the last 168 hours. No results for input(s): AMMONIA in the last 168 hours. CBC:  Recent Labs Lab 06/07/14 0049 06/07/14 1107 06/08/14 0415 06/09/14 0440 06/13/14 0037  WBC 7.4 6.6 7.5 8.2 10.1  NEUTROABS  --   --   --   --  5.0  HGB 8.9* 8.5* 8.6* 8.3* 9.6*  HCT 29.3* 27.5* 29.0* 27.3* 30.8*  MCV 93.3 92.0 94.5 96.1 95.4  PLT 237 181 234 239 217   Cardiac Enzymes: No results for input(s): CKTOTAL, CKMB, CKMBINDEX, TROPONINI in the last 168 hours.  BNP (last 3 results) No results for input(s): PROBNP in the last 8760 hours. CBG:  Recent Labs Lab 06/06/14 0818  GLUCAP 76    Radiological Exams on Admission: Dg Chest Portable 1 View  06/13/2014   CLINICAL DATA:  Acute shortness of breath.  Initial encounter.  EXAM: PORTABLE CHEST  - 1 VIEW  COMPARISON:  06/04/2014 prior radiographs  FINDINGS: Cardiomegaly and mild left basilar scarring again noted.  There is no evidence of focal airspace disease, pulmonary edema, suspicious pulmonary nodule/mass, pleural effusion, or pneumothorax. No acute bony abnormalities are identified.  IMPRESSION: Cardiomegaly without evidence of active cardiopulmonary disease.   Electronically Signed   By: Laveda AbbeJeff  Hu M.D.   On: 06/13/2014 01:06     Assessment/Plan Principal Problem:   SOB (shortness of breath) Active Problems:   Asthma   ESRD on dialysis   DVT (deep venous thrombosis)   1. Shortness of breath - most likely a component of anxiety with mild fluid overload that agent has missed her dialysis yesterday. At this time will admit patient and observe and  get nephrology consult in a.m. for dialysis. EKG is pending. 2. Diarrhea - probably from laxatives. Check stool for C. difficile if patient has further diarrhea. 3. UTI - patient has recurrent UTI patient has been placed on ceftriaxone check urine cultures. 4. Asthma - patient is not wheezing at this time. 5. ESRD on hemodialysis on Tuesday Thursday and Saturday -  See #1.   6. Chronic anemia probably from ESRD - follow CBC. 7. Recently diagnosed lower extremity DVT  - patient was started on Coumadin but is supposed to hold off for shuntogram by Dr. Darrick PennaFields. Patient's INR is still therapeutic and I have placed patient on Lovenox for now. May discuss with Dr. Darrick PennaFields in a.m. for further plans.     Code Status: Full cod Family Communication: Daughter at the bedside.  Disposition Plan: Admit for observation.    Megham Dwyer N. [facility] Pager [phone].  If 7PM-7AM, please contact night-coverage [email or web address] Password [email or web address] 06/13/2014, 4:36 AM

## 2014-06-13 NOTE — Progress Notes (Signed)
Pt was scheduled for shuntogram today with possible PTA of fistula.  Multiple current issues dyspnea, diarrhea, coagulapathy.  We will cancel her shuntogram for today.    Please convert her from Lovenox/Coumadin to heparin for her DVT.  Will follow over next few days and reschedule shuntogram for next week potentally Monday if her overall clinical picture improves.  Fabienne Brunsharles Fields, MD [facility] Office: [phone] Pager: [phone]

## 2014-06-14 DIAGNOSIS — E43 Unspecified severe protein-calorie malnutrition: Secondary | ICD-10-CM

## 2014-06-14 LAB — RENAL FUNCTION PANEL
Albumin: 2.2 g/dL — ABNORMAL LOW (ref 3.5–5.2)
Anion gap: 12 (ref 5–15)
BUN: 28 mg/dL — AB (ref 6–23)
CO2: 27 mEq/L (ref 19–32)
CREATININE: 5.42 mg/dL — AB (ref 0.50–1.10)
Calcium: 8.3 mg/dL — ABNORMAL LOW (ref 8.4–10.5)
Chloride: 99 mEq/L (ref 96–112)
GFR calc Af Amer: 8 mL/min — ABNORMAL LOW (ref >90)
GFR calc non Af Amer: 7 mL/min — ABNORMAL LOW (ref >90)
Glucose, Bld: 84 mg/dL (ref 70–99)
PHOSPHORUS: 3.3 mg/dL (ref 2.3–4.6)
POTASSIUM: 3.7 meq/L (ref 3.7–5.3)
Sodium: 138 mEq/L (ref 137–147)

## 2014-06-14 LAB — PROTIME-INR
INR: 2.27 — ABNORMAL HIGH (ref 0.00–1.49)
INR: 2.09 — AB (ref 0.00–1.49)
PROTHROMBIN TIME: 23.7 s — AB (ref 11.6–15.2)
Prothrombin Time: 25.2 seconds — ABNORMAL HIGH (ref 11.6–15.2)

## 2014-06-14 LAB — VITAMIN B12: Vitamin B-12: 359 pg/mL (ref 211–911)

## 2014-06-14 LAB — CBC
HEMATOCRIT: 24.2 % — AB (ref 36.0–46.0)
HEMOGLOBIN: 7.5 g/dL — AB (ref 12.0–15.0)
MCH: 29.4 pg (ref 26.0–34.0)
MCHC: 31 g/dL (ref 30.0–36.0)
MCV: 94.9 fL (ref 78.0–100.0)
Platelets: 170 10*3/uL (ref 150–400)
RBC: 2.55 MIL/uL — ABNORMAL LOW (ref 3.87–5.11)
RDW: 16.6 % — ABNORMAL HIGH (ref 11.5–15.5)
WBC: 7.5 10*3/uL (ref 4.0–10.5)

## 2014-06-14 MED ORDER — ALTEPLASE 2 MG IJ SOLR: 2.0000 mg | Freq: Once | INTRAMUSCULAR | Status: DC | PRN

## 2014-06-14 MED ORDER — SODIUM CHLORIDE 0.9 % IV SOLN: 100.0000 mL | INTRAVENOUS | Status: DC | PRN

## 2014-06-14 MED ORDER — HALOPERIDOL LACTATE 5 MG/ML IJ SOLN
1.0000 mg | INTRAMUSCULAR | Status: AC
  Administered 2014-06-14: 1 mg via INTRAVENOUS
  Filled 2014-06-14: qty 0.2

## 2014-06-14 MED ORDER — LIDOCAINE HCL (PF) 1 % IJ SOLN: 5.0000 mL | INTRAMUSCULAR | Status: DC | PRN

## 2014-06-14 MED ORDER — PENTAFLUOROPROP-TETRAFLUOROETH EX AERO: 1.0000 "application " | INHALATION_SPRAY | CUTANEOUS | Status: DC | PRN

## 2014-06-14 MED ORDER — NEPRO/CARBSTEADY PO LIQD: 237.0000 mL | ORAL | Status: DC | PRN

## 2014-06-14 MED ORDER — HEPARIN SODIUM (PORCINE) 1000 UNIT/ML DIALYSIS: 1000.0000 [IU] | INTRAMUSCULAR | Status: DC | PRN

## 2014-06-14 MED ORDER — LIDOCAINE-PRILOCAINE 2.5-2.5 % EX CREA: 1.0000 "application " | TOPICAL_CREAM | CUTANEOUS | Status: DC | PRN

## 2014-06-14 MED ORDER — HEPARIN (PORCINE) IN NACL 100-0.45 UNIT/ML-% IJ SOLN
1000.0000 [IU]/h | INTRAMUSCULAR | Status: DC
  Administered 2014-06-15: 750 [IU]/h via INTRAVENOUS
  Filled 2014-06-14 (×3): qty 250

## 2014-06-14 MED ORDER — HALOPERIDOL LACTATE 5 MG/ML IJ SOLN
1.0000 mg | INTRAMUSCULAR | Status: AC
  Administered 2014-06-14: 1 mg via INTRAVENOUS
  Filled 2014-06-14 (×2): qty 0.2

## 2014-06-14 MED ORDER — HEPARIN SODIUM (PORCINE) 1000 UNIT/ML DIALYSIS: 2000.0000 [IU] | INTRAMUSCULAR | Status: DC | PRN

## 2014-06-14 MED ORDER — CYANOCOBALAMIN 1000 MCG/ML IJ SOLN
1000.0000 ug | Freq: Once | INTRAMUSCULAR | Status: AC
  Administered 2014-06-14: 1000 ug via INTRAMUSCULAR
  Filled 2014-06-14: qty 1

## 2014-06-14 NOTE — Procedures (Signed)
I was present at this dialysis session, have reviewed the session itself and made  appropriate changes  Vinson Moselleob Saraiah Bhat MD (pgr) [phone]    ([phone]) [phone] 06/14/2014, 6:51 PM

## 2014-06-14 NOTE — Plan of Care (Signed)
Problem: Consults Goal: General Medical Patient Education See Patient Education Module for specific education. Outcome: Completed/Met Date Met:  06/14/14 SOB Goal: Skin Care Protocol Initiated - if Braden Score 18 or less If consults are not indicated, leave blank or document N/A Outcome: Completed/Met Date Met:  06/14/14 Goal: Nutrition Consult-if indicated Outcome: Completed/Met Date Met:  06/14/14  Problem: Phase I Progression Outcomes Goal: OOB as tolerated unless otherwise ordered Outcome: Completed/Met Date Met:  06/14/14 Goal: Voiding-avoid urinary catheter unless indicated Outcome: Not Applicable Date Met:  79/02/40 Oliguria- dialysis pt Goal: Hemodynamically stable Outcome: Completed/Met Date Met:  06/14/14

## 2014-06-14 NOTE — Progress Notes (Signed)
ANTICOAGULATION CONSULT NOTE - Follow Up Consult  Pharmacy Consult for: Heparin when INR <2 Indication: DVT hx  Allergies  Allergen Reactions  . Nsaids     REACTION: Avoid NSAIDS(renal failure)    Patient Measurements: Weight: 118 lb 9.7 oz (53.8 kg)  Height 5'  IBW 45.5 kg Heparin Dosing Weight: 53 kg  Vital Signs: Temp: 97.4 F (36.3 C) (12/05 1403) Temp Source: Axillary (12/05 1403) BP: 82/36 mmHg (12/05 1915) Pulse Rate: 78 (12/05 1915)  Labs:  Recent Labs  06/13/14 0037 06/13/14 0057 06/13/14 0600 06/14/14 0712 06/14/14 1717 06/14/14 1800  HGB 9.6* 10.2* 8.9*  --  7.5*  --   HCT 30.8* 30.0* 28.5*  --  24.2*  --   PLT 217  --  207  --  170  --   LABPROT 32.4*  --   --  25.2*  --  23.7*  INR 3.13*  --   --  2.27*  --  2.09*  CREATININE 4.81* 4.70* 4.92*  --  5.42*  --   TROPONINI  --   --  <0.30  --   --   --     Estimated Creatinine Clearance: 6 mL/min (by C-G formula based on Cr of 5.42).  Assessment: 80 year old female with RLE DVT on chronic Coumadin now with plans for shuntogram next week. Coumadin is on hold. Pharmacy consulted to dose IV Heparin when INR <2 (confirmed per Dr. Catha GosselinMikhail). INR this morning was elevated at 2.27 and a level was checked again this evening. It was drawn prior to the start of dialysis and resulted back at 2.09.  Goal of Therapy:  Heparin level 0.3-0.7 units/ml Monitor platelets by anticoagulation protocol: Yes   Plan:  1. Since INR will trend down, will start heparin at 750units/hr at 0500 in the morning 2. 8 hour HL after start 3. Daily HL and CBC 4. Follow for s/s bleeding 5. Follow up plans for shuntogram and ability to restart warfarin after procedure  Adriana Quinby D. Roy Snuffer, PharmD, BCPS Clinical Pharmacist Pager: [phone] 06/14/2014 7:42 PM

## 2014-06-14 NOTE — Progress Notes (Signed)
Page on callTriad MD at 10:00pm for patient BP of 87/40. Pt asymptomatic with no s/s of distress.  Elray McgregorMary Lynch, NP returned the page at 10:30pm. Informed to monitor pt BP. Will continue to monitor.

## 2014-06-14 NOTE — Progress Notes (Addendum)
[facility]                                                                              Patient Demographics  [name], is a 80 y.o. female, DOB - [birth year], ZOX:[id number]  Admit date - 06/12/2014   Admitting Physician Sherren Kerns, MD  Outpatient Primary MD for the patient is Dorrene German, MD  LOS - 2   Chief Complaint  Patient presents with  . Shortness of Breath      HPI on 06/13/2014 by Dr. Midge Minium [name] is a 80 y.o. female with a history of ESRD on hemodialysis on Tuesday Thursday and Saturday was brought to the ER after patient was complaining of shortness of breath. As per the family patient did not have dialysis yesterday because patient had multiple episodes of diarrhea. Patient was recently admitted to the hospital and was diagnosed with right lower extremity DVT and was placed on Coumadin. Patient is only to have a shuntogram done today by Dr. Darrick Penna was supposed to be off Coumadin last 3 days but has taken it until yesterday. Patient's INR is subtherapeutic. On exam patient initially was tachypneic and tachycardic and chest x-ray was not showing any definite congestion. Patient's breathing improved with 1 dose of Ativan. But since patient is due for dialysis and there may be a complement of possible mild fluid overload patient has been admitted for observation and get dialysis in the morning. Patient denies any chest pain productive cough fever chills. Patient at the time of discharge was having constipation and patient's daughter did give some laxatives following which patient has had at least 4 episodes of diarrhea. Denies any abdominal pain.  Assessment & Plan  Acute encephalopathy -Multifactorial causes: dyspnea vs UTI -Will continue to monitor closely -?underlying dementia -Vitamin B12: 359 (low normal), will give B12 injection -TSH 0.821  Dyspnea -Resolved, Likely multifactorial including missed dialysis with volume overload  versus anxiety -CXR: Cardiomegaly without evidence of active cardiopulmonary disease -VQ scan pending (although PE not likely due to supratherapeutic INR) -Will continue to monitor  Diarrhea -Per patient, has resolved -Possibly secondary to laxative -C. difficile PCR pending  Urinary tract infection -UA: WBC TNTC, many bacteria, large leukocytes -Continue Rocephin -Pending urine culture  ESRD on hemodialysis -Patient dialyzes on Tuesday, Thursday, Saturday -Nephrology consulted and appreciated -Patient was scheduled for shuntogram with Dr. Darrick Penna 12/4, however canceled -Spoke with Dr. Darrick Penna, patient will be reevaluated and assessed for possible shuntogram on Monday, would like patient on heparin drip -Continue Nepro, Sensipar, Rena-Vite  Asthma -Currently stable, patient is not coughing or wheezing at this time  Chronic anemia -Likely secondary to ESRD -Will continue to monitor CBC  Lower extremity DVT with supratherapeutic INR -Coumadin held, INR 2.27 today -Will place patient on heparin as requested by Dr. Darrick Penna  Hypotension -Continue Midodrine  Severe malnutrition -continue feeding supplements -Nutrition consulted  Code Status: Full  Family Communication: None at bedside, daughter via phone  Disposition Plan: Admitted  Time Spent in minutes   30 minutes  Procedures  None  Consults   Nephrology Vascular Surgery, Dr. Darrick Penna  DVT Prophylaxis  Heparin when INR <2, currently supratherapeutic INR  Lab Results  Component Value Date   PLT 207 06/13/2014    Medications  Scheduled Meds: . albuterol  3 mL Inhalation QHS  . cefTRIAXone (ROCEPHIN)  IV  1 g Intravenous Q24H  . cinacalcet  30 mg Oral Q breakfast  . [START ON 06/19/2014] darbepoetin (ARANESP) injection - DIALYSIS  40 mcg Intravenous Q Thu-HD  . feeding supplement (NEPRO CARB STEADY)  237 mL Oral BID BM  . ferric gluconate (FERRLECIT/NULECIT) IV  125 mg Intravenous Q T,Th,Sa-HD  . midodrine   10 mg Oral TID WC  . multivitamin  1 tablet Oral Daily  . sevelamer carbonate  800 mg Oral TID WC  . sodium chloride  3 mL Intravenous Q12H   Continuous Infusions:  PRN Meds:.sodium chloride, acetaminophen **OR** acetaminophen, feeding supplement (NEPRO CARB STEADY), heparin, heparin, HYDROcodone-acetaminophen, lidocaine (PF), lidocaine-prilocaine, LORazepam, nitroGLYCERIN, ondansetron **OR** ondansetron (ZOFRAN) IV, pentafluoroprop-tetrafluoroeth  Antibiotics   Anti-infectives    Start     Dose/Rate Route Frequency Ordered Stop   06/13/14 0500  cefTRIAXone (ROCEPHIN) 1 g in dextrose 5 % 50 mL IVPB - Premix     1 g100 mL/hr over 30 Minutes Intravenous Every 24 hours 06/13/14 0437          Subjective:   [name] seen and examined today.   She has no specific complaints.  Denies chest pain, shortness of breath, or diarrhea.   Objective:   Filed Vitals:   06/13/14 2133 06/14/14 0318 06/14/14 0453 06/14/14 0500  BP: 82/40 128/63 104/58   Pulse: 83 95 79   Temp: 98.2 F (36.8 C) 97.3 F (36.3 C) 97.2 F (36.2 C)   TempSrc: Oral Oral Oral   Resp: 20 20 20    Weight:    53.8 kg (118 lb 9.7 oz)  SpO2: 100% 100% 100%     Wt Readings from Last 3 Encounters:  06/14/14 53.8 kg (118 lb 9.7 oz)  06/09/14 55.6 kg (122 lb 9.2 oz)  04/10/14 56.7 kg (125 lb)     Intake/Output Summary (Last 24 hours) at 06/14/14 [id number] Last data filed at 06/14/14 0300  Gross per 24 hour  Intake    340 ml  Output      0 ml  Net    340 ml    Exam  General: Well developed, well nourished, NAD, appears stated age  HEENT: NCAT, mucous membranes moist.   Cardiovascular: S1 S2 auscultated, 2/6 SEM, RRR  Respiratory: Clear to auscultation bilaterally with equal chest rise  Abdomen: Soft, nontender, nondistended, + bowel sounds  Extremities: warm dry without cyanosis clubbing or edema, LUE AVF  Neuro: Awake and alert, oriented x 2.  Able to move all extremities.   Data Review   Micro  Results Recent Results (from the past 240 hour(s))  Urine culture     Status: None   Collection Time: 06/05/14 12:14 AM  Result Value Ref Range Status   Specimen Description URINE, CATHETERIZED  Final   Special Requests NONE  Final   Culture  Setup Time   Final    06/05/2014 05:41 Performed at [facility]    Colony Count   Final    30,000 COLONIES/ML Performed at [facility]    Culture   Final    ESCHERICHIA COLI Performed at [facility]    Report Status 06/07/2014 FINAL  Final   Organism ID, Bacteria ESCHERICHIA COLI  Final      Susceptibility   Escherichia coli - MIC*  AMPICILLIN 16 INTERMEDIATE Intermediate     CEFAZOLIN <=4 SENSITIVE Sensitive     CEFTRIAXONE <=1 SENSITIVE Sensitive     CIPROFLOXACIN <=0.25 SENSITIVE Sensitive     GENTAMICIN <=1 SENSITIVE Sensitive     LEVOFLOXACIN <=0.12 SENSITIVE Sensitive     NITROFURANTOIN <=16 SENSITIVE Sensitive     TOBRAMYCIN <=1 SENSITIVE Sensitive     TRIMETH/SULFA <=20 SENSITIVE Sensitive     PIP/TAZO <=4 SENSITIVE Sensitive     * ESCHERICHIA COLI  MRSA PCR Screening     Status: None   Collection Time: 06/05/14  2:09 AM  Result Value Ref Range Status   MRSA by PCR NEGATIVE NEGATIVE Final    Comment:        The GeneXpert MRSA Assay (FDA approved for NASAL specimens only), is one component of a comprehensive MRSA colonization surveillance program. It is not intended to diagnose MRSA infection nor to guide or monitor treatment for MRSA infections.     Radiology Reports Ct Head Wo Contrast  06/05/2014   CLINICAL DATA:  Intermittent confusion associated with hypertension.  EXAM: CT HEAD WITHOUT CONTRAST  TECHNIQUE: Contiguous axial images were obtained from the base of the skull through the vertex without intravenous contrast.  COMPARISON:  None.  FINDINGS: Skull and Sinuses:Negative for fracture or destructive process. The mastoids, middle ears, and imaged paranasal sinuses are clear.   Orbits: No acute abnormality.  Brain: No evidence of acute abnormality, such as acute infarction, hemorrhage, hydrocephalus, or mass lesion/mass effect. There is generalized volume loss consistent with atrophy. Mild chronic small vessel disease with ischemic gliosis noted around the frontal horn of the left lateral ventricle.  IMPRESSION: 1. No acute intracranial disease. 2. Brain atrophy and mild chronic small vessel ischemia.   Electronically Signed   By: Tiburcio Pea M.D.   On: 06/05/2014 03:17   Dg Chest Portable 1 View  06/13/2014   CLINICAL DATA:  Acute shortness of breath.  Initial encounter.  EXAM: PORTABLE CHEST - 1 VIEW  COMPARISON:  06/04/2014 prior radiographs  FINDINGS: Cardiomegaly and mild left basilar scarring again noted.  There is no evidence of focal airspace disease, pulmonary edema, suspicious pulmonary nodule/mass, pleural effusion, or pneumothorax. No acute bony abnormalities are identified.  IMPRESSION: Cardiomegaly without evidence of active cardiopulmonary disease.   Electronically Signed   By: Laveda Abbe M.D.   On: 06/13/2014 01:06   Dg Chest Port 1 View  06/04/2014   CLINICAL DATA:  Asthma and hypertension.  Shortness of breath  EXAM: PORTABLE CHEST - 1 VIEW  COMPARISON:  05/10/2014  FINDINGS: Normal heart size. No pleural effusion or edema. Lung volumes are low. No airspace consolidation. Coarsened interstitial markings are identified bilaterally.  IMPRESSION: 1. Low lung volumes. 2. No acute findings noted.   Electronically Signed   By: Signa Kell M.D.   On: 06/04/2014 23:45    CBC  Recent Labs Lab 06/07/14 1107 06/08/14 0415 06/09/14 0440 06/13/14 0037 06/13/14 0057 06/13/14 0600  WBC 6.6 7.5 8.2 10.1  --  9.4  HGB 8.5* 8.6* 8.3* 9.6* 10.2* 8.9*  HCT 27.5* 29.0* 27.3* 30.8* 30.0* 28.5*  PLT 181 234 239 217  --  207  MCV 92.0 94.5 96.1 95.4  --  95.6  MCH 28.4 28.0 29.2 29.7  --  29.9  MCHC 30.9 29.7* 30.4 31.2  --  31.2  RDW 15.2 15.5 15.5 16.3*  --   16.4*  LYMPHSABS  --   --   --  4.5*  --  0.9  MONOABS  --   --   --  0.5  --  0.1  EOSABS  --   --   --  0.0  --  0.0  BASOSABS  --   --   --  0.0  --  0.0    Chemistries   Recent Labs Lab 06/07/14 1107 06/09/14 0440 06/13/14 0037 06/13/14 0057 06/13/14 0600  NA 135* 138 138 136* 138  K 3.8 4.1 3.7 3.5* 4.0  CL 97 100 96 100 99  CO2 25 24 23   --  22  GLUCOSE 72 66* 72 71 140*  BUN 21 13 20 19 22   CREATININE 4.18* 3.44* 4.81* 4.70* 4.92*  CALCIUM 8.3* 9.0 8.8  --  8.5  AST  --   --   --   --  19  ALT  --   --   --   --  9  ALKPHOS  --   --   --   --  58  BILITOT  --   --   --   --  0.3   ------------------------------------------------------------------------------------------------------------------ estimated creatinine clearance is 6.7 mL/min (by C-G formula based on Cr of 4.92). ------------------------------------------------------------------------------------------------------------------ No results for input(s): HGBA1C in the last 72 hours. ------------------------------------------------------------------------------------------------------------------ No results for input(s): CHOL, HDL, LDLCALC, TRIG, CHOLHDL, LDLDIRECT in the last 72 hours. ------------------------------------------------------------------------------------------------------------------  Recent Labs  06/13/14 1705  TSH 0.821   ------------------------------------------------------------------------------------------------------------------  Recent Labs  06/13/14 1705  VITAMINB12 359    Coagulation profile  Recent Labs Lab 06/07/14 0924 06/08/14 0415 06/09/14 0440 06/13/14 0037 06/14/14 0712  INR 1.11 1.29 1.45 3.13* 2.27*    No results for input(s): DDIMER in the last 72 hours.  Cardiac Enzymes  Recent Labs Lab 06/13/14 0600  TROPONINI <0.30   ------------------------------------------------------------------------------------------------------------------ Invalid  input(s): POCBNP    Dontrelle Mazon D.O. on 06/14/2014 at 9:21 AM  Between 7am to 7pm - Pager - [phone]  After 7pm go to [email or web address] - password [email or web address]  And look for the night coverage person covering for me after hours  [facility] Office  [phone]

## 2014-06-14 NOTE — Progress Notes (Signed)
Patient confused and pulled off dressing at fistula site. Gauze was saturated and replaced with clean 4x4s, tape and ace wrap. Will continue to monitor.

## 2014-06-14 NOTE — Progress Notes (Signed)
[facility]  Subjective  - improved dyspnea, and resolved diarrhea.   Objective 104/58 79 97.2 F (36.2 C) (Oral) 20 100%  Intake/Output Summary (Last 24 hours) at 06/14/14 0953 Last data filed at 06/14/14 0300  Gross per 24 hour  Intake    340 ml  Output      0 ml  Net    340 ml      Assessment/Planning: INR 2.27 today Pending converting her from Lovenox/Coumadin to heparin for her DVT.   Will follow over next few days and reschedule shuntogram for next week potentally Monday if her overall clinical picture improves.  Clinton GallantCOLLINS, Makilah Dowda [facility] LLCMAUREEN 06/14/2014 9:53 AM --  Laboratory Lab Results:  Recent Labs  06/13/14 0037 06/13/14 0057 06/13/14 0600  WBC 10.1  --  9.4  HGB 9.6* 10.2* 8.9*  HCT 30.8* 30.0* 28.5*  PLT 217  --  207   BMET  Recent Labs  06/13/14 0037 06/13/14 0057 06/13/14 0600  NA 138 136* 138  K 3.7 3.5* 4.0  CL 96 100 99  CO2 23  --  22  GLUCOSE 72 71 140*  BUN 20 19 22   CREATININE 4.81* 4.70* 4.92*  CALCIUM 8.8  --  8.5    COAG Lab Results  Component Value Date   INR 2.27* 06/14/2014   INR 3.13* 06/13/2014   INR 1.45 06/09/2014   No results found for: PTT

## 2014-06-14 NOTE — Progress Notes (Signed)
Report called to floor nurse post HD Tx. Informed her that pt would need to be pick up by her staff due to no transporters available in the unit. While waiting on nurse to arrive pts confusion increased and she began trying to climb OOB and pulled her IV out. Nurse called back to come get her and she was not able to accommodate due to getting a admission. House supervisor called for assistance with getting pt out of HD as other pts were getting Txs that could not be tended to due to having to stand by her to keep her from falling.

## 2014-06-14 NOTE — Progress Notes (Signed)
[facility] Progress Note   Subjective: "my arm hurts", "what are you going to do about this?"  Filed Vitals:   06/14/14 0318 06/14/14 0453 06/14/14 0500 06/14/14 1403  BP: 128/63 104/58  107/53  Pulse: 95 79  76  Temp: 97.3 F (36.3 C) 97.2 F (36.2 C)  97.4 F (36.3 C)  TempSrc: Oral Oral  Axillary  Resp: 20 20  18   Weight:   53.8 kg (118 lb 9.7 oz)   SpO2: 100% 100%  100%   Exam: General: frail elderly female, no distress Neck: Supple. JVD not elevated. Lungs: Clear bilaterally Heart: RRR with S1 S2. 2/6 SEM Abdomen: Soft, NTND Ext: No LE edema.  L upper arm marked bruising/ hematoma towards upper aspect of AVF.  AVF patent Neuro: oriented to place, not to day of week "Thursday" or year "1936" Psych: Responds to questions appropriately with a normal affect. Dialysis Access: left AVF  Dialysis: [facility] TTS  4 hr 56 kg std heparin 4 K 2.25 Ca 400/800 left AVF  Calcitriol 0.5 on hold , Aranesp 40 q Sat,  venofer 100 through 12/10   Assessment: 1. SOB - not hypoxemic, CXR clear. No complaints today 2. Confusion/ AMS - underlying issue present at home according to daughter, has been hallucinating here as well as at home per dtr.   Seeing people in the room today ("those 3 little girls"), and doesn't remember what happens from day to day.  This is c/w dementia and/or psychosis.  No meds to implicate in causing MS changes.   3. ESRD - TTS 4. AVF infiltration - will reattempt HD today . If cannot use AVF or infiltrates again, will likely need tunneled cath placement.  5. Chronic hypotension/volume - midodrine started last admission, Echo neg percard effusion. Below dry wt, not eating much. No vol excess 6. Anemia - Hgb 8.9 - continue Aranesp and IV Fe 7. Metabolic bone disease - Calcitriol held after last admssion due to ^ corrected Ca 8. Nutrition - renal diet  9. New DVT 05/2014 - on coumadin 10. DNR  Plan - HD today , reattempt use of AVF today. She is  confused and moves around a lot in HD which is the cause of the infiltration.  Not optimistic about outcome as she has significant dementia and doesn't understand a lot of what is happening to her.  Have d/w family.      Vinson Moselleob Calistro Rauf MD  pager [phone]    cell [phone]  06/14/2014, 2:27 PM     Recent Labs Lab 06/09/14 0440 06/13/14 0037 06/13/14 0057 06/13/14 0600  NA 138 138 136* 138  K 4.1 3.7 3.5* 4.0  CL 100 96 100 99  CO2 24 23  --  22  GLUCOSE 66* 72 71 140*  BUN 13 20 19 22   CREATININE 3.44* 4.81* 4.70* 4.92*  CALCIUM 9.0 8.8  --  8.5  PHOS 2.9  --   --   --     Recent Labs Lab 06/09/14 0440 06/13/14 0600  AST  --  19  ALT  --  9  ALKPHOS  --  58  BILITOT  --  0.3  PROT  --  5.9*  ALBUMIN 1.9* 2.2*    Recent Labs Lab 06/09/14 0440 06/13/14 0037 06/13/14 0057 06/13/14 0600  WBC 8.2 10.1  --  9.4  NEUTROABS  --  5.0  --  8.4*  HGB 8.3* 9.6* 10.2* 8.9*  HCT 27.3* 30.8* 30.0* 28.5*  MCV  96.1 95.4  --  95.6  PLT 239 217  --  207   . albuterol  3 mL Inhalation QHS  . cefTRIAXone (ROCEPHIN)  IV  1 g Intravenous Q24H  . cinacalcet  30 mg Oral Q breakfast  . [START ON 06/19/2014] darbepoetin (ARANESP) injection - DIALYSIS  40 mcg Intravenous Q Thu-HD  . feeding supplement (NEPRO CARB STEADY)  237 mL Oral BID BM  . ferric gluconate (FERRLECIT/NULECIT) IV  125 mg Intravenous Q T,Th,Sa-HD  . midodrine  10 mg Oral TID WC  . multivitamin  1 tablet Oral Daily  . sevelamer carbonate  800 mg Oral TID WC  . sodium chloride  3 mL Intravenous Q12H     sodium chloride, acetaminophen **OR** acetaminophen, feeding supplement (NEPRO CARB STEADY), heparin, heparin, HYDROcodone-acetaminophen, lidocaine (PF), lidocaine-prilocaine, LORazepam, nitroGLYCERIN, ondansetron **OR** ondansetron (ZOFRAN) IV, pentafluoroprop-tetrafluoroeth

## 2014-06-15 ENCOUNTER — Inpatient Hospital Stay (HOSPITAL_COMMUNITY): Payer: PRIVATE HEALTH INSURANCE

## 2014-06-15 LAB — BASIC METABOLIC PANEL
Anion gap: 9 (ref 5–15)
BUN: 8 mg/dL (ref 6–23)
CO2: 29 mEq/L (ref 19–32)
CREATININE: 2.31 mg/dL — AB (ref 0.50–1.10)
Calcium: 8.1 mg/dL — ABNORMAL LOW (ref 8.4–10.5)
Chloride: 103 mEq/L (ref 96–112)
GFR calc Af Amer: 22 mL/min — ABNORMAL LOW (ref >90)
GFR, EST NON AFRICAN AMERICAN: 19 mL/min — AB (ref >90)
GLUCOSE: 92 mg/dL (ref 70–99)
Potassium: 4.3 mEq/L (ref 3.7–5.3)
Sodium: 141 mEq/L (ref 137–147)

## 2014-06-15 LAB — PROTIME-INR
INR: 2.05 — ABNORMAL HIGH (ref 0.00–1.49)
Prothrombin Time: 23.3 seconds — ABNORMAL HIGH (ref 11.6–15.2)

## 2014-06-15 LAB — HEPARIN LEVEL (UNFRACTIONATED): Heparin Unfractionated: <0.1 IU/mL — ABNORMAL LOW (ref 0.30–0.70)

## 2014-06-15 LAB — CBC
HCT: 24.8 % — ABNORMAL LOW (ref 36.0–46.0)
HEMOGLOBIN: 7.5 g/dL — AB (ref 12.0–15.0)
MCH: 28.6 pg (ref 26.0–34.0)
MCHC: 30.2 g/dL (ref 30.0–36.0)
MCV: 94.7 fL (ref 78.0–100.0)
PLATELETS: 157 10*3/uL (ref 150–400)
RBC: 2.62 MIL/uL — ABNORMAL LOW (ref 3.87–5.11)
RDW: 16.7 % — ABNORMAL HIGH (ref 11.5–15.5)
WBC: 7 10*3/uL (ref 4.0–10.5)

## 2014-06-15 LAB — FOLATE RBC: RBC Folate: >1000 ng/mL — ABNORMAL HIGH (ref >280)

## 2014-06-15 MED ORDER — TECHNETIUM TC 99M DIETHYLENETRIAME-PENTAACETIC ACID: 30.0000 | Freq: Once | INTRAVENOUS | Status: AC | PRN

## 2014-06-15 MED ORDER — TECHNETIUM TO 99M ALBUMIN AGGREGATED
5.0000 | Freq: Once | INTRAVENOUS | Status: AC | PRN
Start: 2014-06-15 — End: 2014-06-15

## 2014-06-15 NOTE — Progress Notes (Signed)
       INR 2.05 today.  We would like INR to be 1.5 or less. Pending converting her from Lovenox/Coumadin to heparin for her DVT.  Will follow over next few days and reschedule shuntogram for next week  if her overall clinical picture improves.  Kelee Cunningham MAUREEN PA-C

## 2014-06-15 NOTE — Progress Notes (Signed)
Subjective:  No cos/plesantly confused/ no family present currently / tolerated HD yest. On schedule  Objective Vital signs in last 24 hours: Filed Vitals:   06/14/14 2044 06/14/14 2105 06/14/14 2215 06/15/14 0603  BP: 120/67 100/62 85/57 102/74  Pulse: 116 112 121 98  Temp: 97.4 F (36.3 C)  97.4 F (36.3 C) 97.4 F (36.3 C)  TempSrc: Oral  Oral Oral  Resp: 20 18 20 20   Weight: 53.3 kg (117 lb 8.1 oz)     SpO2:   100% 100%   Weight change: -0.9 kg (-1 lb 15.8 oz)  Physical Exam: Gen: frail thin elderly female, no distress/ pleasantly confused to date only/ knows she is in [facility]/ but needing protective arm wrapping so she does not pull IV out Lungs: CTA bilaterally Heart: RRR with S1 S2. 1/6 SEM lSB Abdomen: Soft, NT, ND Ext: No LE edema. L upper arm some  bruising/ hematoma towards upper aspect of AVF with pos. Bruit  and thrill  Dialysis Access: left  UA A AVF  Dialysis: [facility] T[facility]  4 hr 56 kg std heparin 4 K 2.25 Ca 400/800 left AVF  Calcitriol 0.5 on hold , Aranesp 40 q Sat, venofer 100 through 12/10  Problem: 1. SOB - not hypoxemic, CXR clear. No complaints today 2. Confusion/ AMS - underlying issue present at home according to daughter history yesterday/ had been hallucinating here as well as at home per dtr./ yesterday Seeing people in the room today ("those 3 little girls"), and doesn't remember what happens from day to day. This is c/w dementia and/or psychosis. E. Coli Uti may have some role/  today  Not reporting any Hallucinations  currently  3. ESRD - TTS on schedule 4. AVF infiltration - able to have  HD yesterday  . If cannot use AVF or infiltrates again, will likely need tunneled cath placement. / noted Dr. Darrick PennaFields plans Study in am if INR Down / Was scheduled as OP 5. Chronic hypotension/volume - bp 102/74 midodrine started last admission, Echo neg percard effusion. Wt 53.3kg Below dry wt, not eating much. No vol excess 6. Anemia - Hgb 8.9>7.5  - continue  Aranesp 40mcg q sat ^ 100mcg and IV Fe load through 06/19/14 7. Metabolic bone disease - Calcitriol held after last admssion due to ^ corrected Ca now 9.5 and phos 3.3 on Renvela and Sensipar 8. Nutrition - renal diet  9. New DVT 05/2014 - on coumadin which is being held for Dr. Darrick PennaFields Fistulogram  mon if INR down/ INR 2.05 today 10. DNR  Lenny Pastelavid Zeyfang, PA-C [facility] Beeper [phone] 06/15/2014,1:08 PM  LOS: 3 days   Pt seen, examined, agree w assess/plan as above with additions as indicated. Patient did not do well yesterday with HD again, would not sit still, trying to get out of the bed, did not respond to IV haldol and required restraints to complete her dialysis. Talked to her today and she does not remember anything about going to HD last night.  She requires daily reminders that she is a dialysis patient.  She is on no medications which would be causing hallucination or cognitive dysfunction.  It is going to very difficult to do effective dialysis on Ms Gendron with her severe memory loss, inability to cooperate and follow instructions.  It may not be possible at all.  I have talked with pt's daughter again today to stress how serious this situation is and that there is the real chance that we will  not be able to dialyze Ms Locken d/t her behavior difficulties.  Have d/w primary MD, we are consulting palliative care to meet with pt/family to discuss goals of care. Vinson Moselleob Gaither Biehn MD pager [phone]    cell [phone] 06/15/2014, 2:11 PM     Labs: Basic Metabolic Panel:  Recent Labs Lab 06/09/14 0440  06/13/14 0600 06/14/14 1717 06/15/14 0442  NA 138  < > 138 138 141  K 4.1  < > 4.0 3.7 4.3  CL 100  < > 99 99 103  CO2 24  < > 22 27 29   GLUCOSE 66*  < > 140* 84 92  BUN 13  < > 22 28* 8  CREATININE 3.44*  < > 4.92* 5.42* 2.31*  CALCIUM 9.0  < > 8.5 8.3* 8.1*  PHOS 2.9  --   --  3.3  --   < > = values in this interval not displayed. Liver Function  Tests:  Recent Labs Lab 06/09/14 0440 06/13/14 0600 06/14/14 1717  AST  --  19  --   ALT  --  9  --   ALKPHOS  --  58  --   BILITOT  --  0.3  --   PROT  --  5.9*  --   ALBUMIN 1.9* 2.2* 2.2*   No results for input(s): LIPASE, AMYLASE in the last 168 hours. No results for input(s): AMMONIA in the last 168 hours. CBC:  Recent Labs Lab 06/09/14 0440 06/13/14 0037  06/13/14 0600 06/14/14 1717 06/15/14 0442  WBC 8.2 10.1  --  9.4 7.5 7.0  NEUTROABS  --  5.0  --  8.4*  --   --   HGB 8.3* 9.6*  < > 8.9* 7.5* 7.5*  HCT 27.3* 30.8*  < > 28.5* 24.2* 24.8*  MCV 96.1 95.4  --  95.6 94.9 94.7  PLT 239 217  --  207 170 157  < > = values in this interval not displayed. Cardiac Enzymes:  Recent Labs Lab 06/13/14 0600  TROPONINI <0.30   CBG: No results for input(s): GLUCAP in the last 168 hours.  Studies/Results: Nm Pulmonary Perf And Vent  06/15/2014   CLINICAL DATA:  80 year old female dialysis patient presenting with chest pain.  EXAM: NUCLEAR MEDICINE VENTILATION - PERFUSION LUNG SCAN  TECHNIQUE: Ventilation images were obtained in multiple projections using inhaled aerosol technetium 99 M DTPA. Perfusion images were obtained in multiple projections after intravenous injection of Tc-526m MAA.  RADIOPHARMACEUTICALS:  30.0 mCi Tc-8226m DTPA aerosol and 5.0 mCi Tc-4226m MAA  COMPARISON:  No priors.  FINDINGS: Ventilation: Markedly heterogeneous ventilation with large amount of deposition of radiotracer in the hilar regions bilaterally.  Perfusion: No wedge shaped peripheral perfusion defects to suggest acute pulmonary embolism.  IMPRESSION: 1. Very low probability (0-9%) for pulmonary embolism.   Electronically Signed   By: Trudie Reedaniel  Entrikin M.D.   On: 06/15/2014 10:26   Medications: . heparin 750 Units/hr (06/15/14 0728)   . albuterol  3 mL Inhalation QHS  . cefTRIAXone (ROCEPHIN)  IV  1 g Intravenous Q24H  . cinacalcet  30 mg Oral Q breakfast  . [START ON 06/19/2014] darbepoetin  (ARANESP) injection - DIALYSIS  40 mcg Intravenous Q Thu-HD  . feeding supplement (NEPRO CARB STEADY)  237 mL Oral BID BM  . ferric gluconate (FERRLECIT/NULECIT) IV  125 mg Intravenous Q T,Th,Sa-HD  . midodrine  10 mg Oral TID WC  . multivitamin  1 tablet Oral Daily  . sevelamer carbonate  800  mg Oral TID WC  . sodium chloride  3 mL Intravenous Q12H

## 2014-06-15 NOTE — Progress Notes (Signed)
RN attempted to give patient 8:00 meds before transport to radiology. Patient spit pills on floor. Will reorder meds from pharmacy and administer to patient upon return from radiology.

## 2014-06-15 NOTE — Progress Notes (Addendum)
[facility]                                                                              Patient Demographics  [name], is Page 80 y.o. female, DOB - May 28, 1933, ZOX:[id number]  Admit date - 06/12/2014   Admitting Physician [name] Kernsharles E Fields, MD  Outpatient Primary MD for the patient is [name] GermanAVBUERE,[name] A, MD  LOS - 3   Chief Complaint  Patient presents with  . Shortness of Breath      HPI on 06/13/2014 by Dr. Midge MiniumArshad Kakrakandy [name] is Page 80 y.o. female with Page history of ESRD on hemodialysis on Tuesday Thursday and Saturday was brought to the ER after patient was complaining of shortness of breath. As per the family patient did not have dialysis yesterday because patient had multiple episodes of diarrhea. Patient was recently admitted to the hospital and was diagnosed with right lower extremity DVT and was placed on Coumadin. Patient is only to have Page shuntogram done today by [name] was supposed to be off Coumadin last 3 days but has taken it until yesterday. Patient's INR is subtherapeutic. On exam patient initially was tachypneic and tachycardic and chest x-ray was not showing any definite congestion. Patient's breathing improved with 1 dose of Ativan. But since patient is due for dialysis and there may be Page complement of possible mild fluid overload patient has been admitted for observation and get dialysis in the morning. Patient denies any chest pain productive cough fever chills. Patient at the time of discharge was having constipation and patient's daughter did give some laxatives following which patient has had at least 4 episodes of diarrhea. Denies any abdominal pain.  Assessment & Plan  Acute encephalopathy -Multifactorial causes: dyspnea vs UTI -Will continue to monitor closely -?underlying dementia -Vitamin B12: 359 (low normal), was given B12 injection 06/14/2014 -TSH 0.821  Dyspnea -Resolved, Likely multifactorial including missed dialysis with volume  overload versus anxiety -CXR: Cardiomegaly without evidence of active cardiopulmonary disease -VQ scan pending (although PE not likely due to supratherapeutic INR) -Will continue to monitor  Diarrhea -Per patient, has resolved -Possibly secondary to laxative -C. difficile PCR pending  Urinary tract infection -UA: WBC TNTC, many bacteria, large leukocytes -Continue Rocephin -Pending urine culture  ESRD on hemodialysis -Patient dialyzes on Tuesday, Thursday, Saturday -Nephrology consulted and appreciated -Patient was scheduled for shuntogram with [name] 12/4, however canceled -Spoke with [name], patient will be reevaluated and assessed for possible shuntogram on Monday, would like patient on heparin drip -Continue Nepro, Sensipar, Rena-Vite  Asthma -Currently stable, patient is not coughing or wheezing at this time  Chronic anemia -Likely secondary to ESRD -Will continue to monitor CBC -hemoglobin 7.5 (stable for 2 days) -Continue aranesp and IV Iron  Lower extremity DVT with supratherapeutic INR -Coumadin held, INR 2.05 today -Will place patient on heparin as requested by [name]  Hypotension -Continue Midodrine  Severe malnutrition -continue feeding supplements -Nutrition consulted  Code Status: Full  Family Communication: None at bedside  Disposition Plan: Admitted  Time Spent in minutes   20 minutes  Procedures  None  Consults   Nephrology Vascular Surgery, [name]  DVT Prophylaxis  Heparin when INR <2, currently supratherapeutic INR  Lab Results  Component Value Date   PLT 157 06/15/2014    Medications  Scheduled Meds: . albuterol  3 mL Inhalation QHS  . cefTRIAXone (ROCEPHIN)  IV  1 g Intravenous Q24H  . cinacalcet  30 mg Oral Q breakfast  . [START ON 06/19/2014] darbepoetin (ARANESP) injection - DIALYSIS  40 mcg Intravenous Q Thu-HD  . feeding supplement (NEPRO CARB STEADY)  237 mL Oral BID BM  . ferric gluconate  (FERRLECIT/NULECIT) IV  125 mg Intravenous Q T,Th,Sa-HD  . midodrine  10 mg Oral TID WC  . multivitamin  1 tablet Oral Daily  . sevelamer carbonate  800 mg Oral TID WC  . sodium chloride  3 mL Intravenous Q12H   Continuous Infusions: . heparin 750 Units/hr (06/15/14 0728)   PRN Meds:.acetaminophen **OR** acetaminophen, HYDROcodone-acetaminophen, LORazepam, nitroGLYCERIN, ondansetron **OR** ondansetron (ZOFRAN) IV, technetium albumin aggregated, technetium TC 80M diethylenetriame-pentaacetic acid  Antibiotics   Anti-infectives    Start     Dose/Rate Route Frequency Ordered Stop   06/13/14 0500  cefTRIAXone (ROCEPHIN) 1 g in dextrose 5 % 50 mL IVPB - Premix     1 g100 mL/hr over 30 Minutes Intravenous Every 24 hours 06/13/14 0437          Subjective:   [name] seen and examined today.   She has no specific complaints, and did not want to speak with me. Patient spit out her medications.    Objective:   Filed Vitals:   06/14/14 2044 06/14/14 2105 06/14/14 2215 06/15/14 0603  BP: 120/67 100/62 85/57 102/74  Pulse: 116 112 121 98  Temp: 97.4 F (36.3 C)  97.4 F (36.3 C) 97.4 F (36.3 C)  TempSrc: Oral  Oral Oral  Resp: 20 18 20 20   Weight: 53.3 kg (117 lb 8.1 oz)     SpO2:   100% 100%    Wt Readings from Last 3 Encounters:  06/14/14 53.3 kg (117 lb 8.1 oz)  06/09/14 55.6 kg (122 lb 9.2 oz)  04/10/14 56.7 kg (125 lb)     Intake/Output Summary (Last 24 hours) at 06/15/14 0949 Last data filed at 06/15/14 0940  Gross per 24 hour  Intake    120 ml  Output      0 ml  Net    120 ml    Exam  General: Well developed, well nourished, NAD, appears stated age  HEENT: NCAT, mucous membranes moist.   Cardiovascular: S1 S2 auscultated, 2/6 SEM, RRR  Respiratory: Clear to auscultation bilaterally with equal chest rise  Abdomen: Soft, nontender, nondistended, + bowel sounds  Extremities: warm dry without cyanosis clubbing or edema, LUE AVF (LUE swelling and  hematoma)  Neuro: Awake and alert, oriented x 2.  Able to move all extremities, but does not follow commands.  Data Review   Micro Results No results found for this or any previous visit (from the past 240 hour(s)).  Radiology Reports Ct Head Wo Contrast  06/05/2014   CLINICAL DATA:  Intermittent confusion associated with hypertension.  EXAM: CT HEAD WITHOUT CONTRAST  TECHNIQUE: Contiguous axial images were obtained from the base of the skull through the vertex without intravenous contrast.  COMPARISON:  None.  FINDINGS: Skull and Sinuses:Negative for fracture or destructive process. The mastoids, middle ears, and imaged paranasal sinuses are clear.  Orbits: No acute abnormality.  Brain: No evidence of acute abnormality, such as acute infarction, hemorrhage, hydrocephalus, or mass lesion/mass effect. There is generalized  volume loss consistent with atrophy. Mild chronic small vessel disease with ischemic gliosis noted around the frontal horn of the left lateral ventricle.  IMPRESSION: 1. No acute intracranial disease. 2. Brain atrophy and mild chronic small vessel ischemia.   Electronically Signed   By: Tiburcio Pea M.D.   On: 06/05/2014 03:17   Dg Chest Portable 1 View  06/13/2014   CLINICAL DATA:  Acute shortness of breath.  Initial encounter.  EXAM: PORTABLE CHEST - 1 VIEW  COMPARISON:  06/04/2014 prior radiographs  FINDINGS: Cardiomegaly and mild left basilar scarring again noted.  There is no evidence of focal airspace disease, pulmonary edema, suspicious pulmonary nodule/mass, pleural effusion, or pneumothorax. No acute bony abnormalities are identified.  IMPRESSION: Cardiomegaly without evidence of active cardiopulmonary disease.   Electronically Signed   By: Laveda Abbe M.D.   On: 06/13/2014 01:06   Dg Chest Port 1 View  06/04/2014   CLINICAL DATA:  Asthma and hypertension.  Shortness of breath  EXAM: PORTABLE CHEST - 1 VIEW  COMPARISON:  05/10/2014  FINDINGS: Normal heart size. No pleural  effusion or edema. Lung volumes are low. No airspace consolidation. Coarsened interstitial markings are identified bilaterally.  IMPRESSION: 1. Low lung volumes. 2. No acute findings noted.   Electronically Signed   By: Signa Kell M.D.   On: 06/04/2014 23:45    CBC  Recent Labs Lab 06/09/14 0440 06/13/14 0037 06/13/14 0057 06/13/14 0600 06/14/14 1717 06/15/14 0442  WBC 8.2 10.1  --  9.4 7.5 7.0  HGB 8.3* 9.6* 10.2* 8.9* 7.5* 7.5*  HCT 27.3* 30.8* 30.0* 28.5* 24.2* 24.8*  PLT 239 217  --  207 170 157  MCV 96.1 95.4  --  95.6 94.9 94.7  MCH 29.2 29.7  --  29.9 29.4 28.6  MCHC 30.4 31.2  --  31.2 31.0 30.2  RDW 15.5 16.3*  --  16.4* 16.6* 16.7*  LYMPHSABS  --  4.5*  --  0.9  --   --   MONOABS  --  0.5  --  0.1  --   --   EOSABS  --  0.0  --  0.0  --   --   BASOSABS  --  0.0  --  0.0  --   --     Chemistries   Recent Labs Lab 06/09/14 0440 06/13/14 0037 06/13/14 0057 06/13/14 0600 06/14/14 1717 06/15/14 0442  NA 138 138 136* 138 138 141  K 4.1 3.7 3.5* 4.0 3.7 4.3  CL 100 96 100 99 99 103  CO2 24 23  --  22 27 29   GLUCOSE 66* 72 71 140* 84 92  BUN 13 20 19 22  28* 8  CREATININE 3.44* 4.81* 4.70* 4.92* 5.42* 2.31*  CALCIUM 9.0 8.8  --  8.5 8.3* 8.1*  AST  --   --   --  19  --   --   ALT  --   --   --  9  --   --   ALKPHOS  --   --   --  58  --   --   BILITOT  --   --   --  0.3  --   --    ------------------------------------------------------------------------------------------------------------------ estimated creatinine clearance is 14.2 mL/min (by C-G formula based on Cr of 2.31). ------------------------------------------------------------------------------------------------------------------ No results for input(s): HGBA1C in the last 72 hours. ------------------------------------------------------------------------------------------------------------------ No results for input(s): CHOL, HDL, LDLCALC, TRIG, CHOLHDL, LDLDIRECT in the last 72  hours. ------------------------------------------------------------------------------------------------------------------  Recent  Labs  06/13/14 1705  TSH 0.821   ------------------------------------------------------------------------------------------------------------------  Recent Labs  06/13/14 1705  VITAMINB12 359    Coagulation profile  Recent Labs Lab 06/09/14 0440 06/13/14 0037 06/14/14 0712 06/14/14 1800 06/15/14 0442  INR 1.45 3.13* 2.27* 2.09* 2.05*    No results for input(s): DDIMER in the last 72 hours.  Cardiac Enzymes  Recent Labs Lab 06/13/14 0600  TROPONINI <0.30   ------------------------------------------------------------------------------------------------------------------ Invalid input(s): POCBNP    Avangeline Stockburger D.O. on 06/15/2014 at 9:49 AM  Between 7am to 7pm - Pager - [phone]  After 7pm go to [email or web address] - password [email or web address]  And look for the night coverage person covering for me after hours  [facility] Office  [phone]

## 2014-06-15 NOTE — Progress Notes (Signed)
ANTICOAGULATION CONSULT NOTE - Follow Up Consult  Pharmacy Consult for: Heparin when INR <2 Indication: DVT hx  Allergies  Allergen Reactions  . Nsaids     REACTION: Avoid NSAIDS(renal failure)    Patient Measurements: Weight: 117 lb 8.1 oz (53.3 kg)  Height 5'  IBW 45.5 kg Heparin Dosing Weight: 53 kg  Vital Signs: Temp: 97.4 F (36.3 C) (12/06 0603) Temp Source: Oral (12/06 0603) BP: 102/74 mmHg (12/06 0603) Pulse Rate: 98 (12/06 0603)  Labs:  Recent Labs  06/13/14 0600 06/14/14 0712 06/14/14 1717 06/14/14 1800 06/15/14 0442 06/15/14 1430  HGB 8.9*  --  7.5*  --  7.5*  --   HCT 28.5*  --  24.2*  --  24.8*  --   PLT 207  --  170  --  157  --   LABPROT  --  25.2*  --  23.7* 23.3*  --   INR  --  2.27*  --  2.09* 2.05*  --   HEPARINUNFRC  --   --   --   --   --  <0.10*  CREATININE 4.92*  --  5.42*  --  2.31*  --   TROPONINI <0.30  --   --   --   --   --     Estimated Creatinine Clearance: 14.2 mL/min (by C-G formula based on Cr of 2.31).  Assessment: 80 year old female with RLE DVT on chronic Coumadin now with plans for shuntogram next week. Coumadin is on hold. Pharmacy consulted to dose IV Heparin when INR <2 (confirmed per Dr. Catha GosselinMikhail). Heparin was started this morning at around 0800 since it was assumed INR would drop to <2 from level of 2.09 last evening- INR this morning was 2.05. Initial heparin level undetectable- per RN, it was off for ~1h this morning because patient pulled out line. Level was drawn ~1500, so would anticipate to be slightly higher, but still probably low.  Goal of Therapy:  Heparin level 0.3-0.7 units/ml Monitor platelets by anticoagulation protocol: Yes   Plan:  1. Since INR still elevated, will NOT re-bolus. increase heparin to 850units/hr  2. 8 hour HL after increase 3. Daily HL and CBC; INR in the morning 4. Follow for s/s bleeding 5. Follow up plans for shuntogram and ability to restart warfarin after procedure  Airis Barbee D.  Leston Schueller, PharmD, BCPS Clinical Pharmacist Pager: [phone] 06/15/2014 3:54 PM

## 2014-06-16 ENCOUNTER — Ambulatory Visit (HOSPITAL_COMMUNITY)
Admission: RE | Admit: 2014-06-16 | Payer: PRIVATE HEALTH INSURANCE | Source: Ambulatory Visit | Admitting: Vascular Surgery

## 2014-06-16 DIAGNOSIS — Z7189 Other specified counseling: Secondary | ICD-10-CM

## 2014-06-16 DIAGNOSIS — F411 Generalized anxiety disorder: Secondary | ICD-10-CM

## 2014-06-16 DIAGNOSIS — R630 Anorexia: Secondary | ICD-10-CM | POA: Diagnosis present

## 2014-06-16 DIAGNOSIS — Z515 Encounter for palliative care: Secondary | ICD-10-CM

## 2014-06-16 LAB — CBC
HCT: 25.2 % — ABNORMAL LOW (ref 36.0–46.0)
HEMOGLOBIN: 7.6 g/dL — AB (ref 12.0–15.0)
MCH: 28.7 pg (ref 26.0–34.0)
MCHC: 30.2 g/dL (ref 30.0–36.0)
MCV: 95.1 fL (ref 78.0–100.0)
Platelets: 201 10*3/uL (ref 150–400)
RBC: 2.65 MIL/uL — AB (ref 3.87–5.11)
RDW: 17.1 % — ABNORMAL HIGH (ref 11.5–15.5)
WBC: 10.2 10*3/uL (ref 4.0–10.5)

## 2014-06-16 LAB — BASIC METABOLIC PANEL
Anion gap: 13 (ref 5–15)
BUN: 16 mg/dL (ref 6–23)
CALCIUM: 8.5 mg/dL (ref 8.4–10.5)
CO2: 27 meq/L (ref 19–32)
CREATININE: 3.4 mg/dL — AB (ref 0.50–1.10)
Chloride: 101 mEq/L (ref 96–112)
GFR calc Af Amer: 14 mL/min — ABNORMAL LOW (ref >90)
GFR calc non Af Amer: 12 mL/min — ABNORMAL LOW (ref >90)
GLUCOSE: 73 mg/dL (ref 70–99)
Potassium: 4.3 mEq/L (ref 3.7–5.3)
Sodium: 141 mEq/L (ref 137–147)

## 2014-06-16 LAB — HEPARIN LEVEL (UNFRACTIONATED)
HEPARIN UNFRACTIONATED: 2.2 [IU]/mL — AB (ref 0.30–0.70)
Heparin Unfractionated: 0.18 IU/mL — ABNORMAL LOW (ref 0.30–0.70)
Heparin Unfractionated: 0.61 IU/mL (ref 0.30–0.70)

## 2014-06-16 LAB — PROTIME-INR
INR: 1.9 — ABNORMAL HIGH (ref 0.00–1.49)
Prothrombin Time: 21.9 seconds — ABNORMAL HIGH (ref 11.6–15.2)

## 2014-06-16 MED ORDER — HEPARIN (PORCINE) IN NACL 100-0.45 UNIT/ML-% IJ SOLN
800.0000 [IU]/h | INTRAMUSCULAR | Status: DC
  Administered 2014-06-18: 850 [IU]/h via INTRAVENOUS
  Filled 2014-06-16 (×3): qty 250

## 2014-06-16 NOTE — Consult Note (Signed)
Patient [name]      DOB: 06/25/1933      WEX:[id number]     Consult Note from the Palliative Medicine Team at [facility] [facility] Requested by: Dr. Ree Kida     PCP: Philis Fendt, MD Reason for Consultation: Epes     Phone Number:[phone]  Assessment of patients Current state: I met with [name] and and her daughter, [name], whom she has lived with for ~10 years. [name] understands that her mother has been having great difficulty with dialysis. We discussed the likelihood of a possible underlying dementia that has been exacerbated by her kidney failure and dialysis. [name] tells me that she did not notice any changes prior to initiation of dialysis, however, she does remember her mother telling her sometimes "I just forgot" - so she says it could very much be a possibility. Although, [name] says she was very independent prior to dialysis as well. She enjoyed her puzzles and puzzle books but now cannot do these. [name] is hesitant to d/c dialysis at this time but also admits that if it continues to be this tortuous to her mother she would not want to continue. [name] even asked about the next steps if dialysis does not work out and we discussed hospice options. [name] would like to see how her mother does tomorrow for dialysis and we will continue discussing options and how to best improve her quality of life. [name] was unable to participate fully in this conversation as she was very confused, however she was present for the entire discussion.    Goals of Care: 1.  Code Status: DNR   2. Disposition: To be determined on outcomes. From home with daughter, [name].    3. Symptom Management:   1. Confusion/agitation: This is worse during dialysis. Continue lorazepam every 12 hours prn and could plan a dose to premedicate for dialysis. I see she had no response to haldol noted by Dr. Jonnie Finner.  2. Bowel Regimen: Recommend colace scheduled and possibly  senokot. I see she had diarrhea when first admitted but LBM documented 12/3?.  3. Decreased appetite: Continue Nepro BID. Encourage small frequent meals.   4. Psychosocial: Emotional support provided to patient and family.    Brief HPI: 80 yo female admitted with shortness of breath likely r/t missed dialysis treatment (d/t diarrhea) and possibly anxiety. Recent hospitalization with RLE DVT on Coumadin. Planned shuntogram per Dr. Oneida Alar planned. Being treated for UTI as well. Has severe malnutrition r/t chronic ESRD and likely dementia. PMH significant for gout, HTN, hyperlipidemia, ESRD on dialysis.    ROS: Denies pain, sleep disturbance, diarrhea, constipation.     PMH:  Past Medical History  Diagnosis Date  . Gout   . Renal insufficiency   . Hypertension   . Hyperlipemia      PSH: Past Surgical History  Procedure Laterality Date  . Tubal ligation    . Av fistula placement     I have reviewed the FH and SH and  If appropriate update it with new information. Allergies  Allergen Reactions  . Nsaids     REACTION: Avoid NSAIDS(renal failure)   Scheduled Meds: . albuterol  3 mL Inhalation QHS  . cefTRIAXone (ROCEPHIN)  IV  1 g Intravenous Q24H  . cinacalcet  30 mg Oral Q breakfast  . [START ON 06/19/2014] darbepoetin (ARANESP) injection - DIALYSIS  40 mcg Intravenous Q Thu-HD  . feeding supplement (NEPRO CARB STEADY)  237 mL Oral BID BM  .  ferric gluconate (FERRLECIT/NULECIT) IV  125 mg Intravenous Q T,Th,Sa-HD  . midodrine  10 mg Oral TID WC  . multivitamin  1 tablet Oral Daily  . sevelamer carbonate  800 mg Oral TID WC  . sodium chloride  3 mL Intravenous Q12H   Continuous Infusions: . heparin 1,000 Units/hr (06/16/14 0255)   PRN Meds:.acetaminophen **OR** acetaminophen, HYDROcodone-acetaminophen, LORazepam, nitroGLYCERIN, ondansetron **OR** ondansetron (ZOFRAN) IV    BP 118/60 mmHg  Pulse 85  Temp(Src) 97.4 F (36.3 C) (Oral)  Resp 16  Wt 56.881 kg (125 lb  6.4 oz)  SpO2 95%   PPS: 30%  No intake or output data in the 24 hours ending 06/16/14 1416 LBM: 06/12/14?  Physical Exam:  General: NAD, thin, frail HEENT: Temporal muscle wasting, no JVD, moist mucous membranes Chest: CTA throughout, symmetric, no labored breathin CVS: RRR, murmur Abdomen: Soft, NT, ND, +BS Ext: MAE, no edema, warm to touch Neuro: Awake, alert, oriented to person only, very confused in general  Labs: CBC    Component Value Date/Time   WBC 10.2 06/16/2014 0101   RBC 2.65* 06/16/2014 0101   HGB 7.6* 06/16/2014 0101   HCT 25.2* 06/16/2014 0101   PLT 201 06/16/2014 0101   MCV 95.1 06/16/2014 0101   MCH 28.7 06/16/2014 0101   MCHC 30.2 06/16/2014 0101   RDW 17.1* 06/16/2014 0101   LYMPHSABS 0.9 06/13/2014 0600   MONOABS 0.1 06/13/2014 0600   EOSABS 0.0 06/13/2014 0600   BASOSABS 0.0 06/13/2014 0600    BMET    Component Value Date/Time   NA 141 06/16/2014 0101   K 4.3 06/16/2014 0101   CL 101 06/16/2014 0101   CO2 27 06/16/2014 0101   GLUCOSE 73 06/16/2014 0101   BUN 16 06/16/2014 0101   CREATININE 3.40* 06/16/2014 0101   CALCIUM 8.5 06/16/2014 0101   CALCIUM 9.6 04/10/2014 0917   GFRNONAA 12* 06/16/2014 0101   GFRAA 14* 06/16/2014 0101    CMP     Component Value Date/Time   NA 141 06/16/2014 0101   K 4.3 06/16/2014 0101   CL 101 06/16/2014 0101   CO2 27 06/16/2014 0101   GLUCOSE 73 06/16/2014 0101   BUN 16 06/16/2014 0101   CREATININE 3.40* 06/16/2014 0101   CALCIUM 8.5 06/16/2014 0101   CALCIUM 9.6 04/10/2014 0917   PROT 5.9* 06/13/2014 0600   ALBUMIN 2.2* 06/14/2014 1717   AST 19 06/13/2014 0600   ALT 9 06/13/2014 0600   ALKPHOS 58 06/13/2014 0600   BILITOT 0.3 06/13/2014 0600   GFRNONAA 12* 06/16/2014 0101   GFRAA 14* 06/16/2014 0101     Time In Time Out Total Time Spent with Patient Total Overall Time  1315 1420 60mn 650m    Greater than 50%  of this time was spent counseling and coordinating care related to the above  assessment and plan.   AlVinie SillNP Palliative Medicine Team Pager # [phone]-F 8a-5p) Team Phone # [phone]/Weekends)

## 2014-06-16 NOTE — Progress Notes (Signed)
Utilization review completed.  

## 2014-06-16 NOTE — Progress Notes (Signed)
[facility]                                                                              Patient Demographics  [name], is a 80 y.o. female, DOB - 1932/10/08, ZOX:[id number]  Admit date - 06/12/2014   Admitting Physician Sherren Kernsharles E Fields, MD  Outpatient Primary MD for the patient is Dorrene GermanAVBUERE,[name], MD  LOS - 4   Chief Complaint  Patient presents with  . Shortness of Breath      HPI on 06/13/2014 by Dr. Midge MiniumArshad Kakrakandy Phineas DouglasMattie M Beaumier is a 80 y.o. female with a history of ESRD on hemodialysis on Tuesday Thursday and Saturday was brought to the ER after patient was complaining of shortness of breath. As per the family patient did not have dialysis yesterday because patient had multiple episodes of diarrhea. Patient was recently admitted to the hospital and was diagnosed with right lower extremity DVT and was placed on Coumadin. Patient is only to have a shuntogram done today by Dr. Darrick PennaFields was supposed to be off Coumadin last 3 days but has taken it until yesterday. Patient's INR is subtherapeutic. On exam patient initially was tachypneic and tachycardic and chest x-ray was not showing any definite congestion. Patient's breathing improved with 1 dose of Ativan. But since patient is due for dialysis and there may be a complement of possible mild fluid overload patient has been admitted for observation and get dialysis in the morning. Patient denies any chest pain productive cough fever chills. Patient at the time of discharge was having constipation and patient's daughter did give some laxatives following which patient has had at least 4 episodes of diarrhea. Denies any abdominal pain.  Assessment & Plan  Acute encephalopathy -Multifactorial causes: dyspnea vs UTI -Will continue to monitor closely -?underlying dementia -Vitamin B12: 359 (low normal), was given B12 injection 06/14/2014 -TSH 0.821 -Spoke with daughter, this may be underlying dementia and patient has not been  able to tolerate dialsysis -Palliative care consulted  Dyspnea -Resolved, Likely multifactorial including missed dialysis with volume overload versus anxiety -CXR: Cardiomegaly without evidence of active cardiopulmonary disease -VQ scan: very low probability for PE  Diarrhea -Per patient, has resolved -Possibly secondary to laxative -C. difficile PCR pending  Urinary tract infection -UA: WBC TNTC, many bacteria, large leukocytes -Continue Rocephin -urine culture not received at admission  ESRD on hemodialysis -Patient dialyzes on Tuesday, Thursday, Saturday -Nephrology consulted and appreciated -Patient was scheduled for shuntogram with Dr. Darrick Pennafields 12/4, however canceled -Spoke with Dr. Darrick PennaFields, patient will be reevaluated and assessed for possible shuntogram, would like patient on heparin drip -Continue Nepro, Sensipar, Rena-Vite -Patient was unable to tolerate dialysis. Per nephrology, patient may not be a candidate for further dialysis.  -Spoke with daughter regarding this, she agreed to have palliative care consult. -Pending palliative care consult.   Asthma -Currently stable, patient is not coughing or wheezing at this time  Chronic anemia -Likely secondary to ESRD -Will continue to monitor CBC -hemoglobin 7.6 (stable) -Continue aranesp and IV Iron  Lower extremity DVT with supratherapeutic INR -Coumadin held, INR 1.9 today -heparin started  Hypotension -Continue Midodrine  Severe malnutrition -continue feeding supplements -Nutrition consulted  Goals  of care -Palliative care consulted and appreciated.  Code Status: Full  Family Communication: None at bedside, spoke with daughter via phone  Disposition Plan: Admitted  Time Spent in minutes   20 minutes  Procedures  None  Consults   Nephrology Vascular Surgery, Dr. Darrick Penna Palliative care  DVT Prophylaxis  Heparin   Lab Results  Component Value Date   PLT 201 06/16/2014     Medications  Scheduled Meds: . albuterol  3 mL Inhalation QHS  . cefTRIAXone (ROCEPHIN)  IV  1 g Intravenous Q24H  . cinacalcet  30 mg Oral Q breakfast  . [START ON 06/19/2014] darbepoetin (ARANESP) injection - DIALYSIS  40 mcg Intravenous Q Thu-HD  . feeding supplement (NEPRO CARB STEADY)  237 mL Oral BID BM  . ferric gluconate (FERRLECIT/NULECIT) IV  125 mg Intravenous Q T,Th,Sa-HD  . midodrine  10 mg Oral TID WC  . multivitamin  1 tablet Oral Daily  . sevelamer carbonate  800 mg Oral TID WC  . sodium chloride  3 mL Intravenous Q12H   Continuous Infusions: . heparin 1,000 Units/hr (06/16/14 0255)   PRN Meds:.acetaminophen **OR** acetaminophen, HYDROcodone-acetaminophen, LORazepam, nitroGLYCERIN, ondansetron **OR** ondansetron (ZOFRAN) IV  Antibiotics   Anti-infectives    Start     Dose/Rate Route Frequency Ordered Stop   06/13/14 0500  cefTRIAXone (ROCEPHIN) 1 g in dextrose 5 % 50 mL IVPB - Premix     1 g100 mL/hr over 30 Minutes Intravenous Every 24 hours 06/13/14 0437          Subjective:   [name] seen and examined today.   She has no specific complaints and would like to go home today.  She denies chest pain, shortness breath, abdominal pain.    Objective:   Filed Vitals:   06/15/14 1721 06/15/14 2115 06/15/14 2208 06/16/14 0508  BP: 105/58 116/71  118/60  Pulse: 85 109  85  Temp: 97.8 F (36.6 C) 97.4 F (36.3 C)  97.4 F (36.3 C)  TempSrc: Oral Oral  Oral  Resp: 16 16  16   Weight:    56.881 kg (125 lb 6.4 oz)  SpO2: 100% 100% 99% 95%    Wt Readings from Last 3 Encounters:  06/16/14 56.881 kg (125 lb 6.4 oz)  06/09/14 55.6 kg (122 lb 9.2 oz)  04/10/14 56.7 kg (125 lb)     Intake/Output Summary (Last 24 hours) at 06/16/14 1022 Last data filed at 06/15/14 1122  Gross per 24 hour  Intake     50 ml  Output      0 ml  Net     50 ml    Exam  General: Well developed, well nourished, NAD, appears stated age  HEENT: NCAT, mucous  membranes moist.   Cardiovascular: S1 S2 auscultated, 2/6 SEM, RRR  Respiratory: Clear to auscultation bilaterally with equal chest rise  Abdomen: Soft, nontender, nondistended, + bowel sounds  Extremities: warm dry without cyanosis clubbing or edema, LUE AVF (LUE swelling and hematoma)  Neuro: Awake and alert, oriented x 2.  Able to move all extremities and answer simple questions.   Data Review   Micro Results No results found for this or any previous visit (from the past 240 hour(s)).  Radiology Reports Ct Head Wo Contrast  06/05/2014   CLINICAL DATA:  Intermittent confusion associated with hypertension.  EXAM: CT HEAD WITHOUT CONTRAST  TECHNIQUE: Contiguous axial images were obtained from the base of the skull through the vertex without intravenous contrast.  COMPARISON:  None.  FINDINGS: Skull and Sinuses:Negative for fracture or destructive process. The mastoids, middle ears, and imaged paranasal sinuses are clear.  Orbits: No acute abnormality.  Brain: No evidence of acute abnormality, such as acute infarction, hemorrhage, hydrocephalus, or mass lesion/mass effect. There is generalized volume loss consistent with atrophy. Mild chronic small vessel disease with ischemic gliosis noted around the frontal horn of the left lateral ventricle.  IMPRESSION: 1. No acute intracranial disease. 2. Brain atrophy and mild chronic small vessel ischemia.   Electronically Signed   By: Tiburcio PeaJonathan  Watts M.D.   On: 06/05/2014 03:17   Nm Pulmonary Perf And Vent  06/15/2014   CLINICAL DATA:  80 year old female dialysis patient presenting with chest pain.  EXAM: NUCLEAR MEDICINE VENTILATION - PERFUSION LUNG SCAN  TECHNIQUE: Ventilation images were obtained in multiple projections using inhaled aerosol technetium 99 M DTPA. Perfusion images were obtained in multiple projections after intravenous injection of Tc-7053m MAA.  RADIOPHARMACEUTICALS:  30.0 mCi Tc-7353m DTPA aerosol and 5.0 mCi Tc-4953m MAA  COMPARISON:  No  priors.  FINDINGS: Ventilation: Markedly heterogeneous ventilation with large amount of deposition of radiotracer in the hilar regions bilaterally.  Perfusion: No wedge shaped peripheral perfusion defects to suggest acute pulmonary embolism.  IMPRESSION: 1. Very low probability (0-9%) for pulmonary embolism.   Electronically Signed   By: Trudie Reedaniel  Entrikin M.D.   On: 06/15/2014 10:26   Dg Chest Portable 1 View  06/13/2014   CLINICAL DATA:  Acute shortness of breath.  Initial encounter.  EXAM: PORTABLE CHEST - 1 VIEW  COMPARISON:  06/04/2014 prior radiographs  FINDINGS: Cardiomegaly and mild left basilar scarring again noted.  There is no evidence of focal airspace disease, pulmonary edema, suspicious pulmonary nodule/mass, pleural effusion, or pneumothorax. No acute bony abnormalities are identified.  IMPRESSION: Cardiomegaly without evidence of active cardiopulmonary disease.   Electronically Signed   By: Laveda AbbeJeff  Hu M.D.   On: 06/13/2014 01:06   Dg Chest Port 1 View  06/04/2014   CLINICAL DATA:  Asthma and hypertension.  Shortness of breath  EXAM: PORTABLE CHEST - 1 VIEW  COMPARISON:  05/10/2014  FINDINGS: Normal heart size. No pleural effusion or edema. Lung volumes are low. No airspace consolidation. Coarsened interstitial markings are identified bilaterally.  IMPRESSION: 1. Low lung volumes. 2. No acute findings noted.   Electronically Signed   By: Signa Kellaylor  Stroud M.D.   On: 06/04/2014 23:45    CBC  Recent Labs Lab 06/13/14 0037 06/13/14 0057 06/13/14 0600 06/14/14 1717 06/15/14 0442 06/16/14 0101  WBC 10.1  --  9.4 7.5 7.0 10.2  HGB 9.6* 10.2* 8.9* 7.5* 7.5* 7.6*  HCT 30.8* 30.0* 28.5* 24.2* 24.8* 25.2*  PLT 217  --  207 170 157 201  MCV 95.4  --  95.6 94.9 94.7 95.1  MCH 29.7  --  29.9 29.4 28.6 28.7  MCHC 31.2  --  31.2 31.0 30.2 30.2  RDW 16.3*  --  16.4* 16.6* 16.7* 17.1*  LYMPHSABS 4.5*  --  0.9  --   --   --   MONOABS 0.5  --  0.1  --   --   --   EOSABS 0.0  --  0.0  --   --   --    BASOSABS 0.0  --  0.0  --   --   --     Chemistries   Recent Labs Lab 06/13/14 0037 06/13/14 0057 06/13/14 0600 06/14/14 1717 06/15/14 0442 06/16/14 0101  NA 138 136* 138 138  141 141  K 3.7 3.5* 4.0 3.7 4.3 4.3  CL 96 100 99 99 103 101  CO2 23  --  22 27 29 27   GLUCOSE 72 71 140* 84 92 73  BUN 20 19 22  28* 8 16  CREATININE 4.81* 4.70* 4.92* 5.42* 2.31* 3.40*  CALCIUM 8.8  --  8.5 8.3* 8.1* 8.5  AST  --   --  19  --   --   --   ALT  --   --  9  --   --   --   ALKPHOS  --   --  58  --   --   --   BILITOT  --   --  0.3  --   --   --    ------------------------------------------------------------------------------------------------------------------ estimated creatinine clearance is 10.6 mL/min (by C-G formula based on Cr of 3.4). ------------------------------------------------------------------------------------------------------------------ No results for input(s): HGBA1C in the last 72 hours. ------------------------------------------------------------------------------------------------------------------ No results for input(s): CHOL, HDL, LDLCALC, TRIG, CHOLHDL, LDLDIRECT in the last 72 hours. ------------------------------------------------------------------------------------------------------------------  Recent Labs  06/13/14 1705  TSH 0.821   ------------------------------------------------------------------------------------------------------------------  Recent Labs  06/13/14 1705  VITAMINB12 359    Coagulation profile  Recent Labs Lab 06/13/14 0037 06/14/14 0712 06/14/14 1800 06/15/14 0442 06/16/14 0101  INR 3.13* 2.27* 2.09* 2.05* 1.90*    No results for input(s): DDIMER in the last 72 hours.  Cardiac Enzymes  Recent Labs Lab 06/13/14 0600  TROPONINI <0.30   ------------------------------------------------------------------------------------------------------------------ Invalid input(s): POCBNP    Hershal Eriksson D.O. on 06/16/2014  at 10:22 AM  Between 7am to 7pm - Pager - [phone]  After 7pm go to [email or web address] - password [email or web address]  And look for the night coverage person covering for me after hours  [facility] Office  [phone]

## 2014-06-16 NOTE — Progress Notes (Signed)
Problem: 1. SOB - resolved 2. Confusion/ AMS - ? Underlying dementia  3. ESRD - TTS on schedule 4. AVF infiltration - able to have HD yesterday . If cannot use AVF or infiltrates again, will likely need tunneled cath placement. / noted Dr. Darrick PennaFields plans Study in am/ Was scheduled as OP. I have spoken with pt and I agree with plans for shuntogram.  She & DTR want to continue HD. Can place [facility] if needed but I don't anticipate a need(but will see). HD in AM after procedure. 5. Chronic hypotension/volume - bp 102/74 midodrine started last admission, Echo neg percard effusion. Wt 53.3kg Below dry wt, not eating much. No vol excess 6. New DVT 05/2014 - on coumadin which is being held for Dr. Darrick PennaFields Fistulogram mon if INR down/ INR 2.05 today 7. DNR   Objective: Vital signs in last 24 hours: Temp:  [97.4 F (36.3 C)-97.8 F (36.6 C)] 97.4 F (36.3 C) (12/07 0508) Pulse Rate:  [85-109] 85 (12/07 0508) Resp:  [16] 16 (12/07 0508) BP: (105-118)/(58-71) 118/60 mmHg (12/07 0508) SpO2:  [95 %-100 %] 95 % (12/07 0508) Weight:  [56.881 kg (125 lb 6.4 oz)] 56.881 kg (125 lb 6.4 oz) (12/07 0508) Weight change: 3.981 kg (8 lb 12.4 oz)  Intake/Output from previous day: 12/06 0701 - 12/07 0700 In: 170 [P.O.:120; IV Piggyback:50] Out: -  Intake/Output this shift:    General appearance: alert and mild distress Chest wall: no tenderness Extremities: Right arm wrappef, left arm wioth tortuous AVF, large with swelling and ecchymosis  Pos thrill  Lab Results:  Recent Labs  06/15/14 0442 06/16/14 0101  WBC 7.0 10.2  HGB 7.5* 7.6*  HCT 24.8* 25.2*  PLT 157 201   BMET:  Recent Labs  06/15/14 0442 06/16/14 0101  NA 141 141  K 4.3 4.3  CL 103 101  CO2 29 27  GLUCOSE 92 73  BUN 8 16  CREATININE 2.31* 3.40*  CALCIUM 8.1* 8.5   No results for input(s): PTH in the last 72 hours. Iron Studies: No results for input(s): IRON, TIBC, TRANSFERRIN, FERRITIN in the last 72  hours. Studies/Results: Nm Pulmonary Perf And Vent  06/15/2014   CLINICAL DATA:  80 year old female dialysis patient presenting with chest pain.  EXAM: NUCLEAR MEDICINE VENTILATION - PERFUSION LUNG SCAN  TECHNIQUE: Ventilation images were obtained in multiple projections using inhaled aerosol technetium 99 M DTPA. Perfusion images were obtained in multiple projections after intravenous injection of Tc-9051m MAA.  RADIOPHARMACEUTICALS:  30.0 mCi Tc-8451m DTPA aerosol and 5.0 mCi Tc-7451m MAA  COMPARISON:  No priors.  FINDINGS: Ventilation: Markedly heterogeneous ventilation with large amount of deposition of radiotracer in the hilar regions bilaterally.  Perfusion: No wedge shaped peripheral perfusion defects to suggest acute pulmonary embolism.  IMPRESSION: 1. Very low probability (0-9%) for pulmonary embolism.   Electronically Signed   By: Trudie Reedaniel  Entrikin M.D.   On: 06/15/2014 10:26   Scheduled: . albuterol  3 mL Inhalation QHS  . cefTRIAXone (ROCEPHIN)  IV  1 g Intravenous Q24H  . cinacalcet  30 mg Oral Q breakfast  . [START ON 06/19/2014] darbepoetin (ARANESP) injection - DIALYSIS  40 mcg Intravenous Q Thu-HD  . feeding supplement (NEPRO CARB STEADY)  237 mL Oral BID BM  . ferric gluconate (FERRLECIT/NULECIT) IV  125 mg Intravenous Q T,Th,Sa-HD  . midodrine  10 mg Oral TID WC  . multivitamin  1 tablet Oral Daily  . sevelamer carbonate  800 mg Oral TID WC  .  sodium chloride  3 mL Intravenous Q12H     LOS: 4 days   Kennedee Kitzmiller C 06/16/2014,1:18 PM

## 2014-06-16 NOTE — Consult Note (Addendum)
Shuntogram was tentatively scheduled for today.  INR 1.9 this morning.  Will most likely be in range to do shuntogram tomorrow.  Ok to eat this morning from my standpoint.  Will make Npo p midnight tonight in hopes of doing shuntogram tomorrow.  However, in light of current situation with mental status palliative care etc., will talk with Nephrology later today about goals of care and possible diatek catheter and other goals of care.  Fabienne Brunsharles Guyla Bless, MD [facility] Office: [phone] Pager: [phone]

## 2014-06-16 NOTE — Progress Notes (Signed)
Full note to follow:  I met with [name] and and her daughter, [name], whom she has lived with for ~10 years. [name] understands that her mother has been having great difficulty with dialysis. We discussed the likelihood of a possible underlying dementia that has been exacerbated by her kidney failure and dialysis. [name] tells me that she did not notice any changes prior to initiation of dialysis, however, she does remember her mother telling her sometimes "I just forgot" - so she says it could very much be a possibility. Although, [name] says she was very independent prior to dialysis as well. She enjoyed her puzzles and puzzle books but now cannot do these. [name] is hesitant to d/c dialysis at this time but also admits that if it continues to be this tortuous to her mother she would not want to continue. [name] even asked about the next steps if dialysis does not work out and we discussed hospice options. [name] would like to see how her mother does tomorrow for dialysis and we will continue discussing options and how to best improve her quality of life. [name] was unable to participate fully in this conversation as she was very confused, however she was present for the entire discussion.    , NP Palliative Medicine Team Pager # [phone] (M-F 8a-5p) Team Phone # [phone] (Nights/Weekends) 

## 2014-06-16 NOTE — Progress Notes (Signed)
RT called to assess pt.  BBS are clear all throughout the bases.  Pt denied SOB after RN placed her on 1L of O2.  No breathing treatments needed at this time.

## 2014-06-16 NOTE — Progress Notes (Signed)
ANTICOAGULATION CONSULT NOTE - Follow Up Consult  Pharmacy Consult for heparin Indication: DVT  Labs:  Recent Labs  06/13/14 0600  06/14/14 1717 06/14/14 1800 06/15/14 0442 06/15/14 1430 06/16/14 0101  HGB 8.9*  --  7.5*  --  7.5*  --  7.6*  HCT 28.5*  --  24.2*  --  24.8*  --  25.2*  PLT 207  --  170  --  157  --  201  LABPROT  --   < >  --  23.7* 23.3*  --  21.9*  INR  --   < >  --  2.09* 2.05*  --  1.90*  HEPARINUNFRC  --   --   --   --   --  <0.10* 0.18*  CREATININE 4.92*  --  5.42*  --  2.31*  --  3.40*  TROPONINI <0.30  --   --   --   --   --   --   < > = values in this interval not displayed.   Assessment: 80yo female remains subtherapeutic on heparin after rate increase.  Goal of Therapy:  Heparin level 0.3-0.7 units/ml   Plan:  Will increase heparin gtt by 3 units/kg/hr to 1000 units/hr and check level in 8hr.  Vernard GamblesVeronda Timmi Devora, PharmD, BCPS  06/16/2014,2:34 AM

## 2014-06-16 NOTE — Progress Notes (Signed)
[facility]  Patient Status: Active (receiving services up to time of hospitalization)  [facility] is providing the following services: RN and PT ( PCP would like to have SW added)   If patient discharges after hours, please call [phone].   [name] DadaMiranda Page 06/16/2014, 10:26 AM

## 2014-06-16 NOTE — Progress Notes (Signed)
ANTICOAGULATION CONSULT NOTE - Follow Up Consult  Pharmacy Consult for: Heparin when INR <2 Indication: hx DVT  Allergies  Allergen Reactions  . Nsaids     REACTION: Avoid NSAIDS(renal failure)    Patient Measurements: Weight: 125 lb 6.4 oz (56.881 kg)  Height 5'  IBW 45.5 kg Heparin Dosing Weight: 53 kg  Vital Signs: Temp: 97.4 F (36.3 C) (12/07 0508) Temp Source: Oral (12/07 0508) BP: 118/60 mmHg (12/07 0508) Pulse Rate: 85 (12/07 0508)  Labs:  Recent Labs  06/14/14 1717 06/14/14 1800 06/15/14 0442 06/15/14 1430 06/16/14 0101 06/16/14 1020  HGB 7.5*  --  7.5*  --  7.6*  --   HCT 24.2*  --  24.8*  --  25.2*  --   PLT 170  --  157  --  201  --   LABPROT  --  23.7* 23.3*  --  21.9*  --   INR  --  2.09* 2.05*  --  1.90*  --   HEPARINUNFRC  --   --   --  <0.10* 0.18* 0.61  CREATININE 5.42*  --  2.31*  --  3.40*  --     Estimated Creatinine Clearance: 10.6 mL/min (by C-G formula based on Cr of 3.4).  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 Coumadin is on hold. Pharmacy consulted to dose IV Heparin while INR <2. Heparin level is therapeutic on 1000 units/hr. No bleeding noted, Hb is 7's which is low stable, platelets are normal.   Goal of Therapy:  Heparin level 0.3-0.7 units/ml Monitor platelets by anticoagulation protocol: Yes   Plan:  1. Continue heparin drip at 1000units/hr  2. 8 hr heparin level to confirm 3. Daily heparin level and CBC 4. Follow for s/s bleeding 5. Follow up plans for shuntogram and ability to restart warfarin after procedure  [facility] Il Va Medical CenterJennifer , Pharm.D., BCPS Clinical Pharmacist Pager: [phone] 06/16/2014 12:26 PM

## 2014-06-16 NOTE — Progress Notes (Signed)
ANTICOAGULATION CONSULT NOTE - Follow Up Consult  Pharmacy Consult for: Heparin when INR <2 Indication: hx DVT  Allergies  Allergen Reactions  . Nsaids     REACTION: Avoid NSAIDS(renal failure)    Patient Measurements: Weight: 125 lb 6.4 oz (56.881 kg)  Height 5'  IBW 45.5 kg Heparin Dosing Weight: 53 kg  Vital Signs: Temp: 97.4 F (36.3 C) (12/07 1946) Temp Source: Axillary (12/07 1533) BP: 103/70 mmHg (12/07 1946) Pulse Rate: 95 (12/07 1946)  Labs:  Recent Labs  06/14/14 1717 06/14/14 1800 06/15/14 0442  06/16/14 0101 06/16/14 1020 06/16/14 1900  HGB 7.5*  --  7.5*  --  7.6*  --   --   HCT 24.2*  --  24.8*  --  25.2*  --   --   PLT 170  --  157  --  201  --   --   LABPROT  --  23.7* 23.3*  --  21.9*  --   --   INR  --  2.09* 2.05*  --  1.90*  --   --   HEPARINUNFRC  --   --   --   < > 0.18* 0.61 2.20*  CREATININE 5.42*  --  2.31*  --  3.40*  --   --   < > = values in this interval not displayed.  Estimated Creatinine Clearance: 10.6 mL/min (by C-G formula based on Cr of 3.4).  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 Coumadin is on hold. Pharmacy consulted to dose IV Heparin while INR <2. Heparin level is therapeutic on 1000 units/hr. No bleeding noted, Hb is 7's which is low stable, platelets are normal.   Heparin level is supratherapeutic at 2.20 on heparin 1000 units/hr. No bleeding is reported.  Goal of Therapy:  Heparin level 0.3-0.7 units/ml Monitor platelets by anticoagulation protocol: Yes   Plan:  1. Hold heparin for 1 hour and reduce heparin to 800units/hr  2. 8 hr heparin level 3. Daily heparin level and CBC 4. Follow for s/s bleeding   Arlean HoppingCorey M. Newman PiesBall, PharmD Clinical Pharmacist Pager [phone]  06/16/2014 8:02 PM

## 2014-06-17 DIAGNOSIS — N186 End stage renal disease: Secondary | ICD-10-CM

## 2014-06-17 LAB — BASIC METABOLIC PANEL
Anion gap: 15 (ref 5–15)
BUN: 24 mg/dL — ABNORMAL HIGH (ref 6–23)
CHLORIDE: 99 meq/L (ref 96–112)
CO2: 24 meq/L (ref 19–32)
Calcium: 8.5 mg/dL (ref 8.4–10.5)
Creatinine, Ser: 4.64 mg/dL — ABNORMAL HIGH (ref 0.50–1.10)
GFR calc Af Amer: 9 mL/min — ABNORMAL LOW (ref >90)
GFR calc non Af Amer: 8 mL/min — ABNORMAL LOW (ref >90)
Glucose, Bld: 81 mg/dL (ref 70–99)
Potassium: 4.2 mEq/L (ref 3.7–5.3)
SODIUM: 138 meq/L (ref 137–147)

## 2014-06-17 LAB — CBC
HCT: 24.7 % — ABNORMAL LOW (ref 36.0–46.0)
HEMOGLOBIN: 7.7 g/dL — AB (ref 12.0–15.0)
MCH: 30.3 pg (ref 26.0–34.0)
MCHC: 31.2 g/dL (ref 30.0–36.0)
MCV: 97.2 fL (ref 78.0–100.0)
Platelets: 165 10*3/uL (ref 150–400)
RBC: 2.54 MIL/uL — AB (ref 3.87–5.11)
RDW: 17.4 % — ABNORMAL HIGH (ref 11.5–15.5)
WBC: 10.4 10*3/uL (ref 4.0–10.5)

## 2014-06-17 LAB — HEPARIN LEVEL (UNFRACTIONATED): HEPARIN UNFRACTIONATED: 0.18 [IU]/mL — AB (ref 0.30–0.70)

## 2014-06-17 LAB — PROTIME-INR
INR: 1.72 — AB (ref 0.00–1.49)
Prothrombin Time: 20.3 seconds — ABNORMAL HIGH (ref 11.6–15.2)

## 2014-06-17 MED ORDER — ALTEPLASE 2 MG IJ SOLR: 2.0000 mg | Freq: Once | INTRAMUSCULAR | Status: DC | PRN

## 2014-06-17 MED ORDER — PENTAFLUOROPROP-TETRAFLUOROETH EX AERO
INHALATION_SPRAY | CUTANEOUS | Status: AC
  Filled 2014-06-17: qty 103.5

## 2014-06-17 MED ORDER — HEPARIN SODIUM (PORCINE) 1000 UNIT/ML DIALYSIS: 1000.0000 [IU] | INTRAMUSCULAR | Status: DC | PRN

## 2014-06-17 MED ORDER — LIDOCAINE HCL (PF) 1 % IJ SOLN: 5.0000 mL | INTRAMUSCULAR | Status: DC | PRN

## 2014-06-17 MED ORDER — PENTAFLUOROPROP-TETRAFLUOROETH EX AERO: 1.0000 "application " | INHALATION_SPRAY | CUTANEOUS | Status: DC | PRN

## 2014-06-17 MED ORDER — LIDOCAINE-PRILOCAINE 2.5-2.5 % EX CREA: 1.0000 "application " | TOPICAL_CREAM | CUTANEOUS | Status: DC | PRN

## 2014-06-17 MED ORDER — SODIUM CHLORIDE 0.9 % IV SOLN: 100.0000 mL | INTRAVENOUS | Status: DC | PRN

## 2014-06-17 MED ORDER — NEPRO/CARBSTEADY PO LIQD: 237.0000 mL | ORAL | Status: DC | PRN

## 2014-06-17 NOTE — Procedures (Signed)
Successful cannulation of AVF today therefore will ask for cancellation of fistulogram. [name] C

## 2014-06-17 NOTE — Consult Note (Signed)
Pt had successful dialysis last treatment but still complains of pain and swelling in left arm  PE:  Filed Vitals:   06/16/14 1533 06/16/14 1946 06/16/14 2031 06/17/14 0546  BP: 109/61 103/70  121/67  Pulse: 97 95 91 87  Temp: 98.2 F (36.8 C) 97.4 F (36.3 C)  98.3 F (36.8 C)  TempSrc: Axillary     Resp: 16 16 16 16   Weight:    125 lb 11.2 oz (57.017 kg)  SpO2: 100% 100% 100% 100%    Left upper extremity: pulsatile fistula left upper arm, ecchymosis diffusely with 5 cm hematoma upper deltoid axillary area, some edema forearm and hand  INR 1.7 today   A: functioning left arm AV fistula with recent infiltration.  Family has decided to continue with dialysis at this point.  INR is now subtherapeutic.    Discussed pt care plan with Dr Lowell GuitarPowell.  They will try dialysis with her fistula today again.  If they are able to dialyze her without difficulty then we will leave the fistula alone for now.  However, if there are problems with dialysis today then we will do a fistulogram around 11 am tomorrow and possible intervention or possibly place tunneled dialysis catheter if necessary.    Plan also discussed with pt and daughter  Npo p midnight Consent  Fabienne Brunsharles Birgitta Uhlir, MD [facility] Office: [phone] Pager: [phone]

## 2014-06-17 NOTE — Consult Note (Signed)
Apparently no problems with fistula on HD today.  Will cancel fistulogram at Dr Roanna BanningPowell's request  Will sign off  Fabienne Brunsharles Katherine Syme, MD [facility] [place] Office: [phone] Pager: [phone]

## 2014-06-17 NOTE — Plan of Care (Signed)
Problem: Phase I Progression Outcomes Goal: Pain controlled with appropriate interventions Outcome: Completed/Met Date Met:  06/17/14 Goal: Initial discharge plan identified Outcome: Completed/Met Date Met:  06/17/14     

## 2014-06-17 NOTE — Progress Notes (Addendum)
Transfer note:  Arrival Method: Wheelchair from [employer] Mental Orientation:A&OX2 Assessment: See doc flowsheet Skin: Dry, intact IV: R forearm  Pain: Denies Safety Measures: Safety sitter at bedside Fall Prevention Safety Plan: Reviewed with pt and family 6700 Orientation: Patient has been oriented to the unit, staff and to the room.

## 2014-06-17 NOTE — Progress Notes (Signed)
ANTICOAGULATION CONSULT NOTE - Follow Up Consult  Pharmacy Consult for Heparin Indication: hx DVT  Allergies  Allergen Reactions  . Nsaids     REACTION: Avoid NSAIDS(renal failure)    Patient Measurements: Weight: 125 lb 11.2 oz (57.017 kg)  Height: 5' IBW: 45.5 kg Heparin Dosing Weight: 57 kg  Labs:  Recent Labs  06/14/14 1800 06/15/14 0442  06/16/14 0101 06/16/14 1020 06/16/14 1900 06/17/14 0545  HGB  --  7.5*  --  7.6*  --   --  7.7*  HCT  --  24.8*  --  25.2*  --   --  24.7*  PLT  --  157  --  201  --   --  165  LABPROT 23.7* 23.3*  --  21.9*  --   --   --   INR 2.09* 2.05*  --  1.90*  --   --   --   HEPARINUNFRC  --   --   < > 0.18* 0.61 2.20* 0.18*  CREATININE  --  2.31*  --  3.40*  --   --  4.64*  < > = values in this interval not displayed.  Assessment:   Heparin level down to 0.18 this am on 800 units/hr.   Last level was >2.2 on 1000 units/hr.   Possible shuntogram today, then hemodialysis.   INR 1.9 yesterday; added PT/INR added to HL sample in lab.   Last Coumadin dose on 12/3, prior to admission.  Goal of Therapy:  Heparin level 0.3-0.7 units/ml Monitor platelets by anticoagulation protocol: Yes   Plan:   Will increase heparin drip to 900 units/hr for now.  Follow up PT/INR and possible plans for procedure today, as well as interruptions in heparin infusion for procedure.  If no procedure today, will recheck heparin level ~ 8hrs after dialysis.  Daily heparin level, PT/INR and CBC.  Dennie FettersEgan, Cian Costanzo Donovan, RPh Pager: [phone] 06/17/2014,8:56 AM

## 2014-06-17 NOTE — Progress Notes (Signed)
Report called to 6E 

## 2014-06-17 NOTE — Progress Notes (Signed)
Subjective:  Confused, but no complaints, in no distress  Objective: Vital signs in last 24 hours: Temp:  [97.4 F (36.3 C)-98.3 F (36.8 C)] 98.3 F (36.8 C) (12/08 0546) Pulse Rate:  [87-97] 87 (12/08 0546) Resp:  [16] 16 (12/08 0546) BP: (103-121)/(61-70) 121/67 mmHg (12/08 0546) SpO2:  [100 %] 100 % (12/08 0546) Weight:  [57.017 kg (125 lb 11.2 oz)] 57.017 kg (125 lb 11.2 oz) (12/08 0546) Weight change: 0.136 kg (4.8 oz)  Intake/Output from previous day: 12/07 0701 - 12/08 0700 In: 290 [P.O.:240; IV Piggyback:50] Out: -  Intake/Output this shift:  Lab Results:  Recent Labs  06/16/14 0101 06/17/14 0545  WBC 10.2 10.4  HGB 7.6* 7.7*  HCT 25.2* 24.7*  PLT 201 165   BMET:  Recent Labs  06/14/14 1717  06/16/14 0101 06/17/14 0545  NA 138  < > 141 138  K 3.7  < > 4.3 4.2  CL 99  < > 101 99  CO2 27  < > 27 24  GLUCOSE 84  < > 73 81  BUN 28*  < > 16 24*  CREATININE 5.42*  < > 3.40* 4.64*  CALCIUM 8.3*  < > 8.5 8.5  ALBUMIN 2.2*  --   --   --   < > = values in this interval not displayed. No results for input(s): PTH in the last 72 hours. Iron Studies: No results for input(s): IRON, TIBC, TRANSFERRIN, FERRITIN in the last 72 hours.  Studies/Results: No results found.   EXAM: General appearance:  Alert, in no apparent distress  Resp:  CTA without rales, rhonchi, or wheezes Cardio:  RRR without murmur or rub GI:  + BS, soft and nontender Extremities:  L arm swollen @ AVF and distally, R arm wrapped Access:  AVF @ LUA with + bruit, but tortuous, swollen with local ecchymosis  Dialysis: [facility] TTS  4 hr 56 kg std heparin 4 K 2.25 Ca 400/800 left AVF  Calcitriol 0.5 on hold , Aranesp 40 q Sat, venofer 100 through 12/10  Assessment/Plan: 1. SOB - resolved. 2. Confusion/AMS - possibly underlying dementia and/or psychosis, also with E coli UTI, on Rocephin. 3. AVF infiltration - likely fistulogram today to evaluate swelling, may require catheter if unable to  use. 4. ESRD - HD on TTS @ [facility], K 4.3.  HD pending. 5. HTN/Volume - BP 121/67, on Midodrine 10 mg tid; wt 57 kg, no significant fluid. 6. Anemia - Hgb 7.7, Aranesp 40 mcg on Thurs, Fe per HD through 12/10. 7. Sec HPT - Calcitriol on hold, Sensipar 30 mg qd, Renvela 1 with meals. 8. Nutrition - renal diet, vitamin. 9. New DVT - 05/2014, Coumadin on hold for fistulogram, INR down to 1.9 yesterday. 10. DNR    LOS: 5 days   LYLES,CHARLES 06/17/2014,8:00 AM   Renal: I have spoken to Dr. Darrick PennaFields.  We will attempt to cannulate AVF today and if problematic proceed with dye study in AM Duff Pozzi C

## 2014-06-17 NOTE — Progress Notes (Signed)
[facility]                                                                              Patient Demographics  [name], is Page 80 y.o. female, DOB - Mar 03, 1933, KGM:[id number]  Admit date - 06/12/2014   Admitting Physician [name] Kernsharles E Fields, MD  Outpatient Primary MD for the patient is [name],[name] A, MD  LOS - 5   Chief Complaint  Patient presents with  . Shortness of Breath      HPI on 06/13/2014 by Dr. Midge MiniumArshad Page [name] is Page 80 y.o. female with Page history of ESRD on hemodialysis on Tuesday Thursday and Saturday was brought to the ER after patient was complaining of shortness of breath. As per the family patient did not have dialysis yesterday because patient had multiple episodes of diarrhea. Patient was recently admitted to the hospital and was diagnosed with right lower extremity DVT and was placed on Coumadin. Patient is only to have Page shuntogram done today by Dr. Darrick Page was supposed to be off Coumadin last 3 days but has taken it until yesterday. Patient's INR is subtherapeutic. On exam patient initially was tachypneic and tachycardic and chest x-ray was not showing any definite congestion. Patient's breathing improved with 1 dose of Ativan. But since patient is due for dialysis and there may be Page complement of possible mild fluid overload patient has been admitted for observation and get dialysis in the morning. Patient denies any chest pain productive cough fever chills. Patient at the time of discharge was having constipation and patient's daughter did give some laxatives following which patient has had at least 4 episodes of diarrhea. Denies any abdominal pain.  Interim history Patient had acute encephalopathy likely decline since starting dialysis with questionable underlying dementia.  Unfortunately, have not been able to fully dialyze patient over the past several days she is uncooperative. Palliative Care has been consulted. Patient is scheduled for  shuntogram today, 06/17/2014 with vascular surgery. Patient's daughter would like to continue hemodialysis and obtain shuntogram.   Assessment & Plan  Acute encephalopathy -Multifactorial causes: dyspnea vs UTI -Will continue to monitor closely -?underlying dementia -Vitamin B12: 359 (low normal), was given B12 injection 06/14/2014 -TSH 0.821 -Spoke with daughter, this may be underlying dementia and patient has not been able to tolerate dialsysis -Palliative care consulted  Dyspnea -Resolved, Likely multifactorial including missed dialysis with volume overload versus anxiety -CXR: Cardiomegaly without evidence of active cardiopulmonary disease -VQ scan: very low probability for PE  Diarrhea -Per patient, has resolved (patient had one episode of diarrhea in the emergency department) -Possibly secondary to laxative -C. difficile PCR pending  Urinary tract infection -UA: WBC TNTC, many bacteria, large leukocytes -Continue Rocephin -urine culture not received at admission  ESRD on hemodialysis -Patient dialyzes on Tuesday, Thursday, Saturday -Nephrology consulted and appreciated -Patient was scheduled for shuntogram with Dr. Darrick Page 12/4, however canceled -Spoke with Dr. Darrick Page, patient will be reevaluated and assessed for possible shuntogram, would like patient on heparin drip -Continue Nepro, Sensipar, Rena-Vite -Patient was unable to tolerate dialysis. Per nephrology, patient may not be Page candidate for further dialysis.  -Spoke with daughter regarding this, she agreed to  have palliative care consult. -Palliative care was consulted. Spoke with patient's daughter who would like to continue trying to dialyze patient and obtained shuntogram.  Asthma -Currently stable, patient is not coughing or wheezing at this time  Chronic anemia -Likely secondary to ESRD -Will continue to monitor CBC -hemoglobin 7.7 (stable) -Continue aranesp and IV Iron  Lower extremity DVT with  supratherapeutic INR -Coumadin held, INR 1.72 today -heparin started  Hypotension -Continue Midodrine  Severe malnutrition -continue feeding supplements -Nutrition consulted  Goals of care -Palliative care consulted and appreciated.  Code Status: Full  Family Communication: None at bedside, have spoken with daughter via phone  Disposition Plan: Admitted  Time Spent in minutes   20 minutes  Procedures  None  Consults   Nephrology Vascular Surgery, Dr. Darrick Penna Palliative care  DVT Prophylaxis  Heparin   Lab Results  Component Value Date   PLT 165 06/17/2014    Medications  Scheduled Meds: . albuterol  3 mL Inhalation QHS  . cefTRIAXone (ROCEPHIN)  IV  1 g Intravenous Q24H  . cinacalcet  30 mg Oral Q breakfast  . [START ON 06/19/2014] darbepoetin (ARANESP) injection - DIALYSIS  40 mcg Intravenous Q Thu-HD  . feeding supplement (NEPRO CARB STEADY)  237 mL Oral BID BM  . ferric gluconate (FERRLECIT/NULECIT) IV  125 mg Intravenous Q T,Th,Sa-HD  . midodrine  10 mg Oral TID WC  . multivitamin  1 tablet Oral Daily  . sevelamer carbonate  800 mg Oral TID WC  . sodium chloride  3 mL Intravenous Q12H   Continuous Infusions: . heparin 800 Units/hr (06/16/14 2130)   PRN Meds:.sodium chloride, sodium chloride, acetaminophen **OR** acetaminophen, alteplase, feeding supplement (NEPRO CARB STEADY), heparin, HYDROcodone-acetaminophen, lidocaine (PF), lidocaine-prilocaine, LORazepam, nitroGLYCERIN, ondansetron **OR** ondansetron (ZOFRAN) IV, pentafluoroprop-tetrafluoroeth  Antibiotics   Anti-infectives    Start     Dose/Rate Route Frequency Ordered Stop   06/13/14 0500  cefTRIAXone (ROCEPHIN) 1 g in dextrose 5 % 50 mL IVPB - Premix     1 g100 mL/hr over 30 Minutes Intravenous Every 24 hours 06/13/14 0437          Subjective:   [name] seen and examined today.   Patient has no complaints this morning, but is still confused. Patient is pleasant.  Objective:    Filed Vitals:   06/16/14 1533 06/16/14 1946 06/16/14 2031 06/17/14 0546  BP: 109/61 103/70  121/67  Pulse: 97 95 91 87  Temp: 98.2 F (36.8 C) 97.4 F (36.3 C)  98.3 F (36.8 C)  TempSrc: Axillary     Resp: 16 16 16 16   Weight:    57.017 kg (125 lb 11.2 oz)  SpO2: 100% 100% 100% 100%    Wt Readings from Last 3 Encounters:  06/17/14 57.017 kg (125 lb 11.2 oz)  06/09/14 55.6 kg (122 lb 9.2 oz)  04/10/14 56.7 kg (125 lb)     Intake/Output Summary (Last 24 hours) at 06/17/14 1126 Last data filed at 06/16/14 1900  Gross per 24 hour  Intake    170 ml  Output      0 ml  Net    170 ml    Exam  General: Well developed, well nourished, NAD, appears stated age  HEENT: NCAT, mucous membranes moist.   Cardiovascular: S1 S2 auscultated, 2/6 SEM, RRR  Respiratory: Clear to auscultation bilaterally with equal chest rise  Abdomen: Soft, nontender, nondistended, + bowel sounds  Extremities: warm dry without cyanosis clubbing, LUE AVF (LUE swelling and hematoma)  Neuro: Awake and alert, oriented to self.  Able to move all extremities and answer simple questions.   Data Review   Micro Results No results found for this or any previous visit (from the past 240 hour(s)).  Radiology Reports Ct Head Wo Contrast  06/05/2014   CLINICAL DATA:  Intermittent confusion associated with hypertension.  EXAM: CT HEAD WITHOUT CONTRAST  TECHNIQUE: Contiguous axial images were obtained from the base of the skull through the vertex without intravenous contrast.  COMPARISON:  None.  FINDINGS: Skull and Sinuses:Negative for fracture or destructive process. The mastoids, middle ears, and imaged paranasal sinuses are clear.  Orbits: No acute abnormality.  Brain: No evidence of acute abnormality, such as acute infarction, hemorrhage, hydrocephalus, or mass lesion/mass effect. There is generalized volume loss consistent with atrophy. Mild chronic small vessel disease with ischemic gliosis noted around  the frontal horn of the left lateral ventricle.  IMPRESSION: 1. No acute intracranial disease. 2. Brain atrophy and mild chronic small vessel ischemia.   Electronically Signed   By: Tiburcio PeaJonathan  Watts M.D.   On: 06/05/2014 03:17   Nm Pulmonary Perf And Vent  06/15/2014   CLINICAL DATA:  80 year old female dialysis patient presenting with chest pain.  EXAM: NUCLEAR MEDICINE VENTILATION - PERFUSION LUNG SCAN  TECHNIQUE: Ventilation images were obtained in multiple projections using inhaled aerosol technetium 99 M DTPA. Perfusion images were obtained in multiple projections after intravenous injection of Tc-1522m MAA.  RADIOPHARMACEUTICALS:  30.0 mCi Tc-2322m DTPA aerosol and 5.0 mCi Tc-3122m MAA  COMPARISON:  No priors.  FINDINGS: Ventilation: Markedly heterogeneous ventilation with large amount of deposition of radiotracer in the hilar regions bilaterally.  Perfusion: No wedge shaped peripheral perfusion defects to suggest acute pulmonary embolism.  IMPRESSION: 1. Very low probability (0-9%) for pulmonary embolism.   Electronically Signed   By: Trudie Reedaniel  Entrikin M.D.   On: 06/15/2014 10:26   Dg Chest Portable 1 View  06/13/2014   CLINICAL DATA:  Acute shortness of breath.  Initial encounter.  EXAM: PORTABLE CHEST - 1 VIEW  COMPARISON:  06/04/2014 prior radiographs  FINDINGS: Cardiomegaly and mild left basilar scarring again noted.  There is no evidence of focal airspace disease, pulmonary edema, suspicious pulmonary nodule/mass, pleural effusion, or pneumothorax. No acute bony abnormalities are identified.  IMPRESSION: Cardiomegaly without evidence of active cardiopulmonary disease.   Electronically Signed   By: Laveda AbbeJeff  Hu M.D.   On: 06/13/2014 01:06   Dg Chest Port 1 View  06/04/2014   CLINICAL DATA:  Asthma and hypertension.  Shortness of breath  EXAM: PORTABLE CHEST - 1 VIEW  COMPARISON:  05/10/2014  FINDINGS: Normal heart size. No pleural effusion or edema. Lung volumes are low. No airspace consolidation. Coarsened  interstitial markings are identified bilaterally.  IMPRESSION: 1. Low lung volumes. 2. No acute findings noted.   Electronically Signed   By: Signa Kellaylor  Stroud M.D.   On: 06/04/2014 23:45    CBC  Recent Labs Lab 06/13/14 0037  06/13/14 0600 06/14/14 1717 06/15/14 0442 06/16/14 0101 06/17/14 0545  WBC 10.1  --  9.4 7.5 7.0 10.2 10.4  HGB 9.6*  < > 8.9* 7.5* 7.5* 7.6* 7.7*  HCT 30.8*  < > 28.5* 24.2* 24.8* 25.2* 24.7*  PLT 217  --  207 170 157 201 165  MCV 95.4  --  95.6 94.9 94.7 95.1 97.2  MCH 29.7  --  29.9 29.4 28.6 28.7 30.3  MCHC 31.2  --  31.2 31.0 30.2 30.2 31.2  RDW 16.3*  --  16.4* 16.6* 16.7* 17.1* 17.4*  LYMPHSABS 4.5*  --  0.9  --   --   --   --   MONOABS 0.5  --  0.1  --   --   --   --   EOSABS 0.0  --  0.0  --   --   --   --   BASOSABS 0.0  --  0.0  --   --   --   --   < > = values in this interval not displayed.  Chemistries   Recent Labs Lab 06/13/14 0600 06/14/14 1717 06/15/14 0442 06/16/14 0101 06/17/14 0545  NA 138 138 141 141 138  K 4.0 3.7 4.3 4.3 4.2  CL 99 99 103 101 99  CO2 22 27 29 27 24   GLUCOSE 140* 84 92 73 81  BUN 22 28* 8 16 24*  CREATININE 4.92* 5.42* 2.31* 3.40* 4.64*  CALCIUM 8.5 8.3* 8.1* 8.5 8.5  AST 19  --   --   --   --   ALT 9  --   --   --   --   ALKPHOS 58  --   --   --   --   BILITOT 0.3  --   --   --   --    ------------------------------------------------------------------------------------------------------------------ estimated creatinine clearance is 7.8 mL/min (by C-G formula based on Cr of 4.64). ------------------------------------------------------------------------------------------------------------------ No results for input(s): HGBA1C in the last 72 hours. ------------------------------------------------------------------------------------------------------------------ No results for input(s): CHOL, HDL, LDLCALC, TRIG, CHOLHDL, LDLDIRECT in the last 72  hours. ------------------------------------------------------------------------------------------------------------------ No results for input(s): TSH, T4TOTAL, T3FREE, THYROIDAB in the last 72 hours.  Invalid input(s): FREET3 ------------------------------------------------------------------------------------------------------------------ No results for input(s): VITAMINB12, FOLATE, FERRITIN, TIBC, IRON, RETICCTPCT in the last 72 hours.  Coagulation profile  Recent Labs Lab 06/14/14 0712 06/14/14 1800 06/15/14 0442 06/16/14 0101 06/17/14 0545  INR 2.27* 2.09* 2.05* 1.90* 1.72*    No results for input(s): DDIMER in the last 72 hours.  Cardiac Enzymes  Recent Labs Lab 06/13/14 0600  TROPONINI <0.30   ------------------------------------------------------------------------------------------------------------------ Invalid input(s): POCBNP    Tlaloc Taddei D.O. on 06/17/2014 at 11:26 AM  Between 7am to 7pm - Pager - [phone]  After 7pm go to [email or web address] - password [email or web address]  And look for the night coverage person covering for me after hours  [facility] Office  [phone]

## 2014-06-17 NOTE — Plan of Care (Signed)
Problem: Phase II Progression Outcomes Goal: Vital signs remain stable Outcome: Completed/Met Date Met:  06/17/14     

## 2014-06-18 ENCOUNTER — Encounter (HOSPITAL_COMMUNITY)
Admission: EM | Disposition: A | Discharge status: Skilled Nursing Facility | Payer: Self-pay | Source: Home / Self Care | Attending: Family Medicine

## 2014-06-18 LAB — BASIC METABOLIC PANEL
Anion gap: 13 (ref 5–15)
BUN: 9 mg/dL (ref 6–23)
CO2: 28 mEq/L (ref 19–32)
Calcium: 8.4 mg/dL (ref 8.4–10.5)
Chloride: 97 mEq/L (ref 96–112)
Creatinine, Ser: 2.49 mg/dL — ABNORMAL HIGH (ref 0.50–1.10)
GFR, EST AFRICAN AMERICAN: 20 mL/min — AB (ref >90)
GFR, EST NON AFRICAN AMERICAN: 17 mL/min — AB (ref >90)
Glucose, Bld: 70 mg/dL (ref 70–99)
POTASSIUM: 3.5 meq/L — AB (ref 3.7–5.3)
SODIUM: 138 meq/L (ref 137–147)

## 2014-06-18 LAB — CBC
HCT: 24.1 % — ABNORMAL LOW (ref 36.0–46.0)
Hemoglobin: 7.5 g/dL — ABNORMAL LOW (ref 12.0–15.0)
MCH: 29.8 pg (ref 26.0–34.0)
MCHC: 31.1 g/dL (ref 30.0–36.0)
MCV: 95.6 fL (ref 78.0–100.0)
PLATELETS: 170 10*3/uL (ref 150–400)
RBC: 2.52 MIL/uL — ABNORMAL LOW (ref 3.87–5.11)
RDW: 17.1 % — ABNORMAL HIGH (ref 11.5–15.5)
WBC: 12 10*3/uL — AB (ref 4.0–10.5)

## 2014-06-18 LAB — PROTIME-INR
INR: 1.6 — ABNORMAL HIGH (ref 0.00–1.49)
Prothrombin Time: 19.2 seconds — ABNORMAL HIGH (ref 11.6–15.2)

## 2014-06-18 LAB — HEPARIN LEVEL (UNFRACTIONATED)
HEPARIN UNFRACTIONATED: 0.77 [IU]/mL — AB (ref 0.30–0.70)
HEPARIN UNFRACTIONATED: 0.72 [IU]/mL — AB (ref 0.30–0.70)
Heparin Unfractionated: 0.63 IU/mL (ref 0.30–0.70)

## 2014-06-18 LAB — CLOSTRIDIUM DIFFICILE BY PCR: Toxigenic C. Difficile by PCR: NEGATIVE

## 2014-06-18 SURGERY — FISTULOGRAM
Anesthesia: LOCAL | Laterality: Left

## 2014-06-18 MED ORDER — WARFARIN SODIUM 7.5 MG PO TABS
7.5000 mg | ORAL_TABLET | Freq: Once | ORAL | Status: AC
  Administered 2014-06-18: 7.5 mg via ORAL
  Filled 2014-06-18: qty 1

## 2014-06-18 MED ORDER — ALBUTEROL SULFATE HFA 108 (90 BASE) MCG/ACT IN AERS: 1.0000 | INHALATION_SPRAY | Freq: Four times a day (QID) | RESPIRATORY_TRACT | Status: DC | PRN

## 2014-06-18 MED ORDER — DARBEPOETIN ALFA 200 MCG/0.4ML IJ SOSY
200.0000 ug | PREFILLED_SYRINGE | INTRAMUSCULAR | Status: DC
  Administered 2014-06-19: 200 ug via INTRAVENOUS
  Filled 2014-06-18: qty 0.4

## 2014-06-18 MED ORDER — TRAZODONE 25 MG HALF TABLET
25.0000 mg | ORAL_TABLET | Freq: Every day | ORAL | Status: DC
  Administered 2014-06-18: 25 mg via ORAL
  Filled 2014-06-18 (×2): qty 1

## 2014-06-18 MED ORDER — ALBUTEROL SULFATE (2.5 MG/3ML) 0.083% IN NEBU
2.5000 mg | INHALATION_SOLUTION | Freq: Four times a day (QID) | RESPIRATORY_TRACT | Status: DC | PRN
  Administered 2014-06-18 – 2014-06-22 (×2): 2.5 mg via RESPIRATORY_TRACT
  Filled 2014-06-18 (×2): qty 3

## 2014-06-18 MED ORDER — WARFARIN - PHARMACIST DOSING INPATIENT
Freq: Every day | Status: DC
  Administered 2014-06-19 – 2014-06-21 (×2)

## 2014-06-18 MED ORDER — TRAMADOL HCL 50 MG PO TABS
50.0000 mg | ORAL_TABLET | Freq: Four times a day (QID) | ORAL | Status: DC | PRN
  Administered 2014-06-20: 50 mg via ORAL
  Filled 2014-06-18: qty 1

## 2014-06-18 NOTE — Progress Notes (Signed)
Subjective: Alert, pleasantly confused, talkative, sitter present  Objective: Vital signs in last 24 hours: Temp:  [97.1 F (36.2 C)-98 F (36.7 C)] 97.8 F (36.6 C) (12/09 0458) Pulse Rate:  [66-106] 84 (12/09 0458) Resp:  [16-20] 18 (12/09 0458) BP: (76-143)/(49-82) 90/61 mmHg (12/09 0458) SpO2:  [99 %-100 %] 99 % (12/09 0458) Weight:  [54 kg (119 lb 0.8 oz)] 54 kg (119 lb 0.8 oz) (12/09 0458) Weight change: -3.017 kg (-6 lb 10.4 oz)  Intake/Output from previous day: 12/08 0701 - 12/09 0700 In: 782.2 [P.O.:365; I.V.:97.2; IV Piggyback:320] Out: 544  Intake/Output this shift:   Lab Results:  Recent Labs  06/17/14 0545 06/18/14 0134  WBC 10.4 12.0*  HGB 7.7* 7.5*  HCT 24.7* 24.1*  PLT 165 170   BMET:  Recent Labs  06/17/14 0545 06/18/14 0134  NA 138 138  K 4.2 3.5*  CL 99 97  CO2 24 28  GLUCOSE 81 70  BUN 24* 9  CREATININE 4.64* 2.49*  CALCIUM 8.5 8.4   No results for input(s): PTH in the last 72 hours. Iron Studies: No results for input(s): IRON, TIBC, TRANSFERRIN, FERRITIN in the last 72 hours.  Studies/Results: No results found.   EXAM: General appearance: Alert, in no apparent distress  Resp: CTA without rales, rhonchi, or wheezes Cardio: RRR without murmur or rub GI: + BS, soft and nontender Extremities: L arm swelling @ AVF and distally slightly better, R arm wrapped, no LE edema Access: AVF @ LUA with + bruit, swelling slightly better  Dialysis: [facility] TTS  4 hr 56 kg std heparin 4 K 2.25 Ca 400/800 left AVF  Calcitriol 0.5 on hold , Aranesp 40 q Sat, venofer 100 through 12/10  Assessment/Plan: 1. SOB - resolved. 2. Confusion/AMS - possibly underlying dementia and/or psychosis, also with E coli UTI, on Rocephin. 3. AVF infiltration - successfully used for HD yesterday, fistulogram cancelled. 4. ESRD - HD on TTS @ [facility], K 3.5. HD tomorrow. 5. HTN/Volume - BP 90/61, on Midodrine 10 mg tid; wt 54 kg s/p net UF only 544 ml, below  EDW. 6. Anemia - Hgb 7.5, Aranesp 40 mcg on Thurs, Fe per HD through 12/10.  Increase Aranesp to 200 mcg. 7. Sec HPT - Ca 8.4, Calcitriol on hold, Sensipar 30 mg qd, Renvela 1 with meals. 8. Nutrition - renal diet, vitamin. 9. New DVT - 05/2014, Coumadin on hold for fistulogram, INR down to 1.6.  Restart Coumadin. 10. DNR    LOS: 6 days   LYLES,CHARLES 06/18/2014,7:07 AM  Renal Attending: Did well with dialysis yesterday via AVF.  Post procedure LUE hematoma with blister. Remains on coumadin for DVT. Kyleeann Cremeans C

## 2014-06-18 NOTE — Progress Notes (Signed)
[facility]                                                                              Patient Demographics  [name], is a 80 y.o. female, DOB - 1933-06-08, ZOX:[id number]  Admit date - 06/12/2014   Admitting Physician Edsel Petrin, DO  Outpatient Primary MD for the patient is Dorrene German, MD  LOS - 6   Chief Complaint  Patient presents with  . Shortness of Breath      HPI on 06/13/2014 by Dr. Midge Minium [name] is a 81 y.o. female with a history of ESRD on hemodialysis on Tuesday Thursday and Saturday was brought to the ER after patient was complaining of shortness of breath. As per the family patient did not have dialysis yesterday because patient had multiple episodes of diarrhea. Patient was recently admitted to the hospital and was diagnosed with right lower extremity DVT and was placed on Coumadin. Patient suppoed to have a shuntogram done 06/13/14 by Dr. Darrick Penna  On exam patient initially was tachypneic and tachycardic and chest x-ray was not showing any definite congestion-? there may be a complement of possible mild fluid overload patient has been admitted for observation and get dialysis in the morning. Patient denies any chest pain productive cough fever chills. Patient at the time of discharge was having constipation and patient's daughter did give some laxatives following which patient has had at least 4 episodes of diarrhea. Denies any abdominal pain.  Interim history Patient had acute encephalopathy likely decline since starting dialysis with questionable underlying dementia.  Unfortunately, have not been able to fully dialyze patient over the past several days she is uncooperative. Palliative Care has been consulted. Patient is scheduled for shuntogram today, 06/17/2014 with vascular surgery. Patient's daughter would like to continue hemodialysis and obtain shuntogram.   Assessment & Plan  Acute encephalopathy -Multifactorial causes:  dyspnea vs UTI -Will continue to monitor closely -?underlying dementia -Vitamin B12: 359 (low normal), was given B12 injection 06/14/2014 -TSH 0.821 -Spoke with daughter, this may be underlying dementia and patient has not been able to tolerate dialsysis -Palliative care consulted, input appreciated from Ms Parker-Small meals, PRe-med with ativan and low dose Trazadone qhs.  Vicodin probably doesn't metabolize well with ESRD, so have changed patient to Tramadol  Dyspnea -Resolved, Likely multifactorial including missed dialysis with volume overload versus anxiety -CXR: Cardiomegaly without evidence of active cardiopulmonary disease -VQ scan: very low probability for PE  Diarrhea -Per patient, has resolved (patient had one episode of diarrhea in the emergency department) -Possibly secondary to laxative -C. difficile PCR 12/8 neg  Pyelonephritis unclear source -UA: WBC TNTC, many bacteria, large leukocytes -Rocephin d/c 06/18/14 as has received 4 days IV therpay -urine culture not received at admission  ESRD on hemodialysis -Patient dialyzes on Tuesday, Thursday, Saturday -Nephrology consulted and appreciated -Patient was scheduled for shuntogram with Dr. Darrick Penna 12/4, however canceled -Spoke with Dr. Darrick Penna, patient  Reevaluated and working shunt -Spoke with daughter regarding this, she agreed to have palliative care consult.  Asthma -Currently stable, patient is not coughing or wheezing at this time  Chronic anemia -Likely secondary to ESRD -Will continue to monitor CBC -hemoglobin  7.7 (stable) -Continue aranesp and IV Iron  Lower extremity DVT 05/2014 with supratherapeutic INR -Coumadin held, INR 1.6 today -heparin d/c 12/9  Hypotension -Continue Midodrine  Severe malnutrition -continue feeding supplements -Nutrition consulted  Goals of care -Palliative care consulted and appreciated.  Code Status: Full  Family Communication: None at bedside, have spoken with  daughter via phone  Disposition Plan: Admitted  Time Spent in minutes   20 minutes  Procedures  None  Consults   Nephrology Vascular Surgery, Dr. Darrick PennaFields Palliative care  DVT Prophylaxis  Heparin   Lab Results  Component Value Date   PLT 170 06/18/2014    Medications  Scheduled Meds: . albuterol  3 mL Inhalation QHS  . cefTRIAXone (ROCEPHIN)  IV  1 g Intravenous Q24H  . cinacalcet  30 mg Oral Q breakfast  . [START ON 06/19/2014] darbepoetin (ARANESP) injection - DIALYSIS  200 mcg Intravenous Q Thu-HD  . feeding supplement (NEPRO CARB STEADY)  237 mL Oral BID BM  . ferric gluconate (FERRLECIT/NULECIT) IV  125 mg Intravenous Q T,Th,Sa-HD  . midodrine  10 mg Oral TID WC  . multivitamin  1 tablet Oral Daily  . sevelamer carbonate  800 mg Oral TID WC  . sodium chloride  3 mL Intravenous Q12H  . warfarin  7.5 mg Oral ONCE-1800  . Warfarin - Pharmacist Dosing Inpatient   Does not apply q1800   Continuous Infusions: . heparin 800 Units/hr (06/18/14 1316)   PRN Meds:.acetaminophen **OR** acetaminophen, HYDROcodone-acetaminophen, LORazepam, nitroGLYCERIN, ondansetron **OR** ondansetron (ZOFRAN) IV  Antibiotics   Anti-infectives    Start     Dose/Rate Route Frequency Ordered Stop   06/13/14 0500  cefTRIAXone (ROCEPHIN) 1 g in dextrose 5 % 50 mL IVPB - Premix     1 g100 mL/hr over 30 Minutes Intravenous Every 24 hours 06/13/14 0437          Subjective:   Mild confusion. Doing fair today Somewhat less issues today with dialysis. Cannot tell me day/date/year  Objective:   Filed Vitals:   06/17/14 1951 06/17/14 2116 06/18/14 0458 06/18/14 1003  BP: 95/57  90/61 95/67  Pulse: 91  84 88  Temp: 97.7 F (36.5 C)  97.8 F (36.6 C) 97 F (36.1 C)  TempSrc: Oral  Oral Oral  Resp: 18  18 18   Weight:   54 kg (119 lb 0.8 oz)   SpO2: 100% 99% 99% 99%    Wt Readings from Last 3 Encounters:  06/18/14 54 kg (119 lb 0.8 oz)  06/09/14 55.6 kg (122 lb 9.2 oz)  04/10/14  56.7 kg (125 lb)     Intake/Output Summary (Last 24 hours) at 06/18/14 1524 Last data filed at 06/18/14 0930  Gross per 24 hour  Intake 712.22 ml  Output    544 ml  Net 168.22 ml    Exam  General: Well developed, well nourished, NAD, appears stated age  HEENT: NCAT, mucous membranes moist.   Cardiovascular: S1 S2 auscultated, 2/6 SEM, RRR  Respiratory: Clear to auscultation bilaterally with equal chest rise  Abdomen: Soft, nontender, nondistended, + bowel sounds  Extremities: warm dry without cyanosis clubbing, LUE AVF (LUE swelling and hematoma)  Neuro: Awake and alert, oriented to self.  Able to move all extremities and answer simple questions.   Data Review   Micro Results Recent Results (from the past 240 hour(s))  Clostridium Difficile by PCR     Status: None   Collection Time: 06/17/14  6:00 PM  Result Value Ref  Range Status   C difficile by pcr NEGATIVE NEGATIVE Final    Radiology Reports Ct Head Wo Contrast  06/05/2014   CLINICAL DATA:  Intermittent confusion associated with hypertension.  EXAM: CT HEAD WITHOUT CONTRAST  TECHNIQUE: Contiguous axial images were obtained from the base of the skull through the vertex without intravenous contrast.  COMPARISON:  None.  FINDINGS: Skull and Sinuses:Negative for fracture or destructive process. The mastoids, middle ears, and imaged paranasal sinuses are clear.  Orbits: No acute abnormality.  Brain: No evidence of acute abnormality, such as acute infarction, hemorrhage, hydrocephalus, or mass lesion/mass effect. There is generalized volume loss consistent with atrophy. Mild chronic small vessel disease with ischemic gliosis noted around the frontal horn of the left lateral ventricle.  IMPRESSION: 1. No acute intracranial disease. 2. Brain atrophy and mild chronic small vessel ischemia.   Electronically Signed   By: Tiburcio PeaJonathan  Watts M.D.   On: 06/05/2014 03:17   Nm Pulmonary Perf And Vent  06/15/2014   CLINICAL DATA:   80 year old female dialysis patient presenting with chest pain.  EXAM: NUCLEAR MEDICINE VENTILATION - PERFUSION LUNG SCAN  TECHNIQUE: Ventilation images were obtained in multiple projections using inhaled aerosol technetium 99 M DTPA. Perfusion images were obtained in multiple projections after intravenous injection of Tc-5291m MAA.  RADIOPHARMACEUTICALS:  30.0 mCi Tc-7891m DTPA aerosol and 5.0 mCi Tc-3691m MAA  COMPARISON:  No priors.  FINDINGS: Ventilation: Markedly heterogeneous ventilation with large amount of deposition of radiotracer in the hilar regions bilaterally.  Perfusion: No wedge shaped peripheral perfusion defects to suggest acute pulmonary embolism.  IMPRESSION: 1. Very low probability (0-9%) for pulmonary embolism.   Electronically Signed   By: Trudie Reedaniel  Entrikin M.D.   On: 06/15/2014 10:26   Dg Chest Portable 1 View  06/13/2014   CLINICAL DATA:  Acute shortness of breath.  Initial encounter.  EXAM: PORTABLE CHEST - 1 VIEW  COMPARISON:  06/04/2014 prior radiographs  FINDINGS: Cardiomegaly and mild left basilar scarring again noted.  There is no evidence of focal airspace disease, pulmonary edema, suspicious pulmonary nodule/mass, pleural effusion, or pneumothorax. No acute bony abnormalities are identified.  IMPRESSION: Cardiomegaly without evidence of active cardiopulmonary disease.   Electronically Signed   By: Laveda AbbeJeff  Hu M.D.   On: 06/13/2014 01:06   Dg Chest Port 1 View  06/04/2014   CLINICAL DATA:  Asthma and hypertension.  Shortness of breath  EXAM: PORTABLE CHEST - 1 VIEW  COMPARISON:  05/10/2014  FINDINGS: Normal heart size. No pleural effusion or edema. Lung volumes are low. No airspace consolidation. Coarsened interstitial markings are identified bilaterally.  IMPRESSION: 1. Low lung volumes. 2. No acute findings noted.   Electronically Signed   By: Signa Kellaylor  Stroud M.D.   On: 06/04/2014 23:45    CBC  Recent Labs Lab 06/13/14 0037  06/13/14 0600 06/14/14 1717 06/15/14 0442  06/16/14 0101 06/17/14 0545 06/18/14 0134  WBC 10.1  --  9.4 7.5 7.0 10.2 10.4 12.0*  HGB 9.6*  < > 8.9* 7.5* 7.5* 7.6* 7.7* 7.5*  HCT 30.8*  < > 28.5* 24.2* 24.8* 25.2* 24.7* 24.1*  PLT 217  --  207 170 157 201 165 170  MCV 95.4  --  95.6 94.9 94.7 95.1 97.2 95.6  MCH 29.7  --  29.9 29.4 28.6 28.7 30.3 29.8  MCHC 31.2  --  31.2 31.0 30.2 30.2 31.2 31.1  RDW 16.3*  --  16.4* 16.6* 16.7* 17.1* 17.4* 17.1*  LYMPHSABS 4.5*  --  0.9  --   --   --   --   --  MONOABS 0.5  --  0.1  --   --   --   --   --   EOSABS 0.0  --  0.0  --   --   --   --   --   BASOSABS 0.0  --  0.0  --   --   --   --   --   < > = values in this interval not displayed.  Chemistries   Recent Labs Lab 06/13/14 0600 06/14/14 1717 06/15/14 0442 06/16/14 0101 06/17/14 0545 06/18/14 0134  NA 138 138 141 141 138 138  K 4.0 3.7 4.3 4.3 4.2 3.5*  CL 99 99 103 101 99 97  CO2 22 27 29 27 24 28   GLUCOSE 140* 84 92 73 81 70  BUN 22 28* 8 16 24* 9  CREATININE 4.92* 5.42* 2.31* 3.40* 4.64* 2.49*  CALCIUM 8.5 8.3* 8.1* 8.5 8.5 8.4  AST 19  --   --   --   --   --   ALT 9  --   --   --   --   --   ALKPHOS 58  --   --   --   --   --   BILITOT 0.3  --   --   --   --   --    ------------------------------------------------------------------------------------------------------------------ estimated creatinine clearance is 13.2 mL/min (by C-G formula based on Cr of 2.49). ------------------------------------------------------------------------------------------------------------------ No results for input(s): HGBA1C in the last 72 hours. ------------------------------------------------------------------------------------------------------------------ No results for input(s): CHOL, HDL, LDLCALC, TRIG, CHOLHDL, LDLDIRECT in the last 72 hours. ------------------------------------------------------------------------------------------------------------------ No results for input(s): TSH, T4TOTAL, T3FREE, THYROIDAB in the last 72  hours.  Invalid input(s): FREET3 ------------------------------------------------------------------------------------------------------------------ No results for input(s): VITAMINB12, FOLATE, FERRITIN, TIBC, IRON, RETICCTPCT in the last 72 hours.  Coagulation profile  Recent Labs Lab 06/14/14 1800 06/15/14 0442 06/16/14 0101 06/17/14 0545 06/18/14 0134  INR 2.09* 2.05* 1.90* 1.72* 1.60*    No results for input(s): DDIMER in the last 72 hours.  Cardiac Enzymes  Recent Labs Lab 06/13/14 0600  TROPONINI <0.30   ------------------------------------------------------------------------------------------------------------------ Invalid input(s): POCBNP    Pleas Koch, MD [facility] [phone]

## 2014-06-18 NOTE — Progress Notes (Signed)
INITIAL NUTRITION ASSESSMENT  Pt meets criteria for SEVERE MALNUTRITION in the context of chronic illness as evidenced by severe fat and muscle mass loss.  DOCUMENTATION CODES Per approved criteria  -Severe malnutrition in the context of chronic illness   INTERVENTION: Continue Nepro Shake po BID, each supplement provides 425 kcal and 19 grams protein  Encourage adequate PO intake.  NUTRITION DIAGNOSIS: Increased nutrient needs related to chronic illness, ESRD as evidenced by estimated nutrition needs; ongoing  Goal: Pt to meet >/= 90% of their estimated nutrition needs; met  Monitor:  PO intake, weight trends, labs, I/O's  Reason for Assessment: MST  80 y.o. female  Admitting Dx: SOB (shortness of breath)  ASSESSMENT: Pt with a history of ESRD on hemodialysis on Tuesday Thursday and Saturday was brought to the ER after patient was complaining of shortness of breath. On exam patient initially was tachypneic and tachycardic and chest x-ray was not showing any definite congestion.  Meal completion has been 100%. Appetite is good. Pt reports she has been drinking her Nepro Shakes. Will continue with currently intervention. Pt was encouraged to continue eating her food at meals and to drink her supplements.   Height: Ht Readings from Last 1 Encounters:  06/06/14 5' (1.524 m)    Weight: Wt Readings from Last 1 Encounters:  06/18/14 119 lb 0.8 oz (54 kg)    BMI:  Body mass index is 23.25 kg/(m^2).  Re-Estimated Nutritional Needs: Kcal: 1700-1900 Protein: 80-95 grams Fluid: 1.2 L/day  Skin: Stage II pressure ulcer on sacrum, RLE edema  Diet Order: Diet renal W/1269m fluid restriction   Intake/Output Summary (Last 24 hours) at 06/18/14 1428 Last data filed at 06/18/14 0930  Gross per 24 hour  Intake 712.22 ml  Output    544 ml  Net 168.22 ml    Last BM: 12/8  Labs:   Recent Labs Lab 06/14/14 1717  06/16/14 0101 06/17/14 0545 06/18/14 0134  NA 138  <  > 141 138 138  K 3.7  < > 4.3 4.2 3.5*  CL 99  < > 101 99 97  CO2 27  < > _0 BUN 28*  < > 16 24* 9  CREATININE 5.42*  < > 3.40* 4.64* 2.49*  CALCIUM 8.3*  < > 8.5 8.5 8.4  PHOS 3.3  --   --   --   --   GLUCOSE 84  < > 73 81 70  < > = values in this interval not displayed.  CBG (last 3)  No results for input(s): GLUCAP in the last 72 hours.  Scheduled Meds: . albuterol  3 mL Inhalation QHS  . cefTRIAXone (ROCEPHIN)  IV  1 g Intravenous Q24H  . cinacalcet  30 mg Oral Q breakfast  . [START ON 06/19/2014] darbepoetin (ARANESP) injection - DIALYSIS  200 mcg Intravenous Q Thu-HD  . feeding supplement (NEPRO CARB STEADY)  237 mL Oral BID BM  . ferric gluconate (FERRLECIT/NULECIT) IV  125 mg Intravenous Q T,Th,Sa-HD  . midodrine  10 mg Oral TID WC  . multivitamin  1 tablet Oral Daily  . sevelamer carbonate  800 mg Oral TID WC  . sodium chloride  3 mL Intravenous Q12H  . warfarin  7.5 mg Oral ONCE-1800  . Warfarin - Pharmacist Dosing Inpatient   Does not apply q1800    Continuous Infusions: . heparin 800 Units/hr (06/18/14 1316)    Past Medical History  Diagnosis Date  . Gout   . Renal  insufficiency   . Hypertension   . Hyperlipemia     Past Surgical History  Procedure Laterality Date  . Tubal ligation    . Av fistula placement       La, MS, RD, LDN Pager # [phone] After hours/ weekend pager # [phone]  

## 2014-06-18 NOTE — Progress Notes (Signed)
ANTICOAGULATION CONSULT NOTE - Follow Up Consult  Pharmacy Consult for Heparin, starting Coumadin Indication: hx DVT  Allergies  Allergen Reactions  . Nsaids     REACTION: Avoid NSAIDS(renal failure)    Patient Measurements: Weight: 119 lb 0.8 oz (54 kg)  Height: 5' IBW: 45.5 kg Heparin Dosing Weight: 57 kg  Labs:  Recent Labs  06/16/14 0101  06/17/14 0545 06/18/14 0134 06/18/14 1027  HGB 7.6*  --  7.7* 7.5*  --   HCT 25.2*  --  24.7* 24.1*  --   PLT 201  --  165 170  --   LABPROT 21.9*  --  20.3* 19.2*  --   INR 1.90*  --  1.72* 1.60*  --   HEPARINUNFRC 0.18*  < > 0.18* 0.77* 0.72*  CREATININE 3.40*  --  4.64* 2.49*  --   < > = values in this interval not displayed.  Assessment: -Heparin level this AM remains mildly elevated at 0.72 on 850 units/hr. Getting a therapeutic level has been challenging  -Fistulogram has been cancelled.   -Resuming Coumadin today. INR 1.6 today; added PT/INR added to HL sample in lab. -Last Coumadin dose on 12/3, prior to admission.  Goal of Therapy:  Heparin level 0.3-0.7 units/ml Monitor platelets by anticoagulation protocol: Yes  INR 2-3    Plan:  -Will decrease heparin drip to 800 units/hr for now. -Warfarin 7.5 mg x 1 dose today  -Follow up 8 hour HL at 2130 -Daily PT/INR and CBC  -Stop heparin infusion when INR is therapeutic   Vinnie LevelBenjamin Krissie Merrick, PharmD., BCPS Clinical Pharmacist Pager [phone]

## 2014-06-18 NOTE — Progress Notes (Signed)
ANTICOAGULATION CONSULT NOTE - Follow Up Consult  Pharmacy Consult for heparin Indication: h/o DVT  Labs:  Recent Labs  06/15/14 0442  06/16/14 0101  06/16/14 1900 06/17/14 0545 06/18/14 0134  HGB 7.5*  --  7.6*  --   --  7.7* 7.5*  HCT 24.8*  --  25.2*  --   --  24.7* 24.1*  PLT 157  --  201  --   --  165 170  LABPROT 23.3*  --  21.9*  --   --  20.3*  --   INR 2.05*  --  1.90*  --   --  1.72*  --   HEPARINUNFRC  --   < > 0.18*  < > 2.20* 0.18* 0.77*  CREATININE 2.31*  --  3.40*  --   --  4.64*  --   < > = values in this interval not displayed.   Assessment: 80yo female now supratherapeutic on heparin after rate increase.  Goal of Therapy:  Heparin level 0.3-0.7 units/ml   Plan:  Will decrease heparin gtt slightly to 850 units/hr and check level in 8hr.  [name], PharmD, BCPS  06/18/2014,2:17 AM

## 2014-06-18 NOTE — Progress Notes (Signed)
ANTICOAGULATION CONSULT NOTE - Follow Up Consult  Pharmacy Consult for Heparin, starting Coumadin Indication: hx DVT  Allergies  Allergen Reactions  . Nsaids     REACTION: Avoid NSAIDS(renal failure)    Patient Measurements: Weight: 119 lb 0.8 oz (54 kg)  Height: 5' IBW: 45.5 kg Heparin Dosing Weight: 57 kg  Labs:  Recent Labs  06/16/14 0101  06/17/14 0545 06/18/14 0134 06/18/14 1027 06/18/14 2157  HGB 7.6*  --  7.7* 7.5*  --   --   HCT 25.2*  --  24.7* 24.1*  --   --   PLT 201  --  165 170  --   --   LABPROT 21.9*  --  20.3* 19.2*  --   --   INR 1.90*  --  1.72* 1.60*  --   --   HEPARINUNFRC 0.18*  < > 0.18* 0.77* 0.72* 0.63  CREATININE 3.40*  --  4.64* 2.49*  --   --   < > = values in this interval not displayed.  Assessment: -Heparin level this AM remains mildly elevated at 0.72 on 850 units/hr. Getting a therapeutic level has been challenging  -Fistulogram has been cancelled.   -Resuming Coumadin today. INR 1.6 today; added PT/INR added to HL sample in lab. -Last Coumadin dose on 12/3, prior to admission.  HL is therapeutic at 0.63 on heparin 800 units/hr. No bleeding is noted.  Goal of Therapy:  Heparin level 0.3-0.7 units/ml Monitor platelets by anticoagulation protocol: Yes  INR 2-3    Plan:  Continue heparin 800 units/hr Recheck HL with AM labs Daily PT/INR and CBC  Stop heparin infusion when INR is therapeutic   Arlean Hoppingorey M. Newman PiesBall, PharmD Clinical Pharmacist Pager [phone]

## 2014-06-18 NOTE — Progress Notes (Signed)
Progress Note from the Palliative Medicine Team at [facility]  Subjective: [name] is sitting up in bed and looking through the newspaper. She is smiling and pleasant but does not remember anything about dialysis. Nursing told me that [name] did better BUT she had a sitter with her and she still had to stop dialysis 30 minutes earlier. Family wishes to continue dialysis if at all possible. We discussed option of friends/family sitting with her during her dialysis treatment but this is a huge commitment - family seems willing to try this. I also recommend premedication with lorazepam before dialysis treatment. Discussed these options with patient and daughter, [name], and also that if she continues to be miserable during dialysis we have to consider other comfort options - [name] understands but wishes to first try these alternatives.    Objective: Allergies  Allergen Reactions  . Nsaids     REACTION: Avoid NSAIDS(renal failure)   Scheduled Meds: . albuterol  3 mL Inhalation QHS  . cefTRIAXone (ROCEPHIN)  IV  1 g Intravenous Q24H  . cinacalcet  30 mg Oral Q breakfast  . [START ON 06/19/2014] darbepoetin (ARANESP) injection - DIALYSIS  200 mcg Intravenous Q Thu-HD  . feeding supplement (NEPRO CARB STEADY)  237 mL Oral BID BM  . ferric gluconate (FERRLECIT/NULECIT) IV  125 mg Intravenous Q T,Th,Sa-HD  . midodrine  10 mg Oral TID WC  . multivitamin  1 tablet Oral Daily  . sevelamer carbonate  800 mg Oral TID WC  . sodium chloride  3 mL Intravenous Q12H  . Warfarin - Pharmacist Dosing Inpatient   Does not apply q1800   Continuous Infusions: . heparin 850 Units/hr (06/18/14 0505)   PRN Meds:.acetaminophen **OR** acetaminophen, HYDROcodone-acetaminophen, LORazepam, nitroGLYCERIN, ondansetron **OR** ondansetron (ZOFRAN) IV  BP 90/61 mmHg  Pulse 84  Temp(Src) 97.8 F (36.6 C) (Oral)  Resp 18  Wt 54 kg (119 lb 0.8 oz)  SpO2 99%   PPS: 30%   Intake/Output Summary (Last 24  hours) at 06/18/14 1019 Last data filed at 06/18/14 0930  Gross per 24 hour  Intake 832.22 ml  Output    544 ml  Net 288.22 ml      LBM: 06/17/14  Physical Exam:  General: NAD, thin, frail HEENT: Temporal muscle wasting, no JVD, moist mucous membranes Chest: CTA throughout, symmetric, no labored breathin CVS: RRR, murmur Abdomen: Soft, NT, ND, +BS Ext: MAE, no edema, warm to touch Neuro: Awake, alert, oriented to person only, very confused in general   Labs: CBC    Component Value Date/Time   WBC 12.0* 06/18/2014 0134   RBC 2.52* 06/18/2014 0134   HGB 7.5* 06/18/2014 0134   HCT 24.1* 06/18/2014 0134   PLT 170 06/18/2014 0134   MCV 95.6 06/18/2014 0134   MCH 29.8 06/18/2014 0134   MCHC 31.1 06/18/2014 0134   RDW 17.1* 06/18/2014 0134   LYMPHSABS 0.9 06/13/2014 0600   MONOABS 0.1 06/13/2014 0600   EOSABS 0.0 06/13/2014 0600   BASOSABS 0.0 06/13/2014 0600    BMET    Component Value Date/Time   NA 138 06/18/2014 0134   K 3.5* 06/18/2014 0134   CL 97 06/18/2014 0134   CO2 28 06/18/2014 0134   GLUCOSE 70 06/18/2014 0134   BUN 9 06/18/2014 0134   CREATININE 2.49* 06/18/2014 0134   CALCIUM 8.4 06/18/2014 0134   CALCIUM 9.6 04/10/2014 0917   GFRNONAA 17* 06/18/2014 0134   GFRAA 20* 06/18/2014 0134    CMP  Component Value Date/Time   NA 138 06/18/2014 0134   K 3.5* 06/18/2014 0134   CL 97 06/18/2014 0134   CO2 28 06/18/2014 0134   GLUCOSE 70 06/18/2014 0134   BUN 9 06/18/2014 0134   CREATININE 2.49* 06/18/2014 0134   CALCIUM 8.4 06/18/2014 0134   CALCIUM 9.6 04/10/2014 0917   PROT 5.9* 06/13/2014 0600   ALBUMIN 2.2* 06/14/2014 1717   AST 19 06/13/2014 0600   ALT 9 06/13/2014 0600   ALKPHOS 58 06/13/2014 0600   BILITOT 0.3 06/13/2014 0600   GFRNONAA 17* 06/18/2014 0134   GFRAA 20* 06/18/2014 0134    Assessment and Plan: 1. Code Status: DNR 2. Symptom Control: 1. Confusion/agitation: This is worse during dialysis and at night.  1. Continue  lorazepam every 12 hours prn and could plan a dose to PREMEDICATE FOR DIALYSIS. 2. Sleep disturbance/agitation: Consider a trial of TRAZODONE 25 MG at night.  2. Bowel Regimen: Recommend colace scheduled and possibly senokot.  3. Pain: [name] tells me that Vicodin does not seem to help and makes her mother "shaky and more confused." Could consider tramadol for pain.  4. Decreased appetite: Continue Nepro BID. Encourage small frequent meals.  3. Psycho/Social: Emotional support provided to patient and family at bedside.  4. Disposition: Likely home when stable.     Time In Time Out Total Time Spent with Patient Total Overall Time  1000 1020 15min 20min    Greater than 50%  of this time was spent counseling and coordinating care related to the above assessment and plan.  Yong ChannelAlicia Jeffey Janssen, NP Palliative Medicine Team Pager # [phone] (M-F 8a-5p) Team Phone # [phone] (Nights/Weekends)   1

## 2014-06-18 NOTE — Plan of Care (Signed)
Problem: Phase II Progression Outcomes Goal: Tol increased activity, up in chair for at least 4 hrs/HD pt Outcome: Progressing     

## 2014-06-19 LAB — COMPREHENSIVE METABOLIC PANEL
ALK PHOS: 61 U/L (ref 39–117)
ALT: 7 U/L (ref 0–35)
AST: 12 U/L (ref 0–37)
Albumin: 2.1 g/dL — ABNORMAL LOW (ref 3.5–5.2)
Anion gap: 13 (ref 5–15)
BUN: 17 mg/dL (ref 6–23)
CO2: 26 meq/L (ref 19–32)
Calcium: 8.3 mg/dL — ABNORMAL LOW (ref 8.4–10.5)
Chloride: 98 mEq/L (ref 96–112)
Creatinine, Ser: 4.18 mg/dL — ABNORMAL HIGH (ref 0.50–1.10)
GFR calc non Af Amer: 9 mL/min — ABNORMAL LOW (ref >90)
GFR, EST AFRICAN AMERICAN: 11 mL/min — AB (ref >90)
Glucose, Bld: 70 mg/dL (ref 70–99)
POTASSIUM: 3.5 meq/L — AB (ref 3.7–5.3)
SODIUM: 137 meq/L (ref 137–147)
TOTAL PROTEIN: 5.4 g/dL — AB (ref 6.0–8.3)
Total Bilirubin: 0.4 mg/dL (ref 0.3–1.2)

## 2014-06-19 LAB — CBC
HCT: 21 % — ABNORMAL LOW (ref 36.0–46.0)
Hemoglobin: 6.5 g/dL — CL (ref 12.0–15.0)
MCH: 28.9 pg (ref 26.0–34.0)
MCHC: 31 g/dL (ref 30.0–36.0)
MCV: 93.3 fL (ref 78.0–100.0)
PLATELETS: 166 10*3/uL (ref 150–400)
RBC: 2.25 MIL/uL — AB (ref 3.87–5.11)
RDW: 17.7 % — ABNORMAL HIGH (ref 11.5–15.5)
WBC: 10.7 10*3/uL — ABNORMAL HIGH (ref 4.0–10.5)

## 2014-06-19 LAB — HEPARIN LEVEL (UNFRACTIONATED)
Heparin Unfractionated: 0.53 IU/mL (ref 0.30–0.70)
Heparin Unfractionated: <0.1 IU/mL — ABNORMAL LOW (ref 0.30–0.70)

## 2014-06-19 LAB — PREPARE RBC (CROSSMATCH)

## 2014-06-19 LAB — MAGNESIUM: Magnesium: 1.8 mg/dL (ref 1.5–2.5)

## 2014-06-19 LAB — PHOSPHORUS: Phosphorus: 3.4 mg/dL (ref 2.3–4.6)

## 2014-06-19 LAB — PROTIME-INR
INR: 1.54 — AB (ref 0.00–1.49)
Prothrombin Time: 18.6 seconds — ABNORMAL HIGH (ref 11.6–15.2)

## 2014-06-19 MED ORDER — WARFARIN SODIUM 7.5 MG PO TABS
7.5000 mg | ORAL_TABLET | Freq: Once | ORAL | Status: AC
Start: 2014-06-19 — End: 2014-06-19
  Administered 2014-06-19: 7.5 mg via ORAL
  Filled 2014-06-19: qty 1

## 2014-06-19 MED ORDER — DARBEPOETIN ALFA 200 MCG/0.4ML IJ SOSY
PREFILLED_SYRINGE | INTRAMUSCULAR | Status: AC
  Administered 2014-06-19: 13:00:00
  Filled 2014-06-19: qty 0.4

## 2014-06-19 MED ORDER — SODIUM CHLORIDE 0.9 % IV SOLN
Freq: Once | INTRAVENOUS | Status: AC
  Administered 2014-06-19: 13:00:00 via INTRAVENOUS

## 2014-06-19 NOTE — Progress Notes (Signed)
[facility]                                                                              Patient Demographics  [name], is a 80 y.o. female, DOB - 04-Jun-1933, ZOX:[id number]  Admit date - 06/12/2014   Admitting Physician [name], DO  Outpatient Primary MD for the patient is Dorrene GermanAVBUERE,[name], MD  LOS - 7   Chief Complaint  Patient presents with  . Shortness of Breath      HPI on 06/13/2014 by Dr. [name] is a 80 y.o. female with a history of ESRD on hemodialysis on Tuesday Thursday and Saturday was brought to the ER after patient was complaining of shortness of breath. As per the family patient did not have dialysis yesterday because patient had multiple episodes of diarrhea. Patient was recently admitted to the hospital and was diagnosed with right lower extremity DVT and was placed on Coumadin. Patient suppoed to have a shuntogram done 06/13/14 by Dr. Darrick PennaFields  On exam patient initially was tachypneic and tachycardic and chest x-ray was not showing any definite congestion-? there may be a complement of possible mild fluid overload patient has been admitted for observation and get dialysis in the morning. Patient denies any chest pain productive cough fever chills. Patient at the time of discharge was having constipation and patient's daughter did give some laxatives following which patient has had at least 4 episodes of diarrhea. Denies any abdominal pain.    Assessment & Plan  Acute encephalopathy -Multifactorial causes: dyspnea vs UTI-potentially medication effect as started trazadone and Ativan given which we will hold  -?underlying dementia -Vitamin B12: 359 (low normal), was given B12 injection 06/14/2014 -TSH 0.821 -Spoke with daughter, this may be underlying dementia and patient has not been able to tolerate dialsysis -Palliative care consulted, input appreciated from Ms Jimmey RalphParker- Vicodin probably doesn't metabolize well with ESRD, so  have changed patient to Tramadol  Dyspnea -Resolved, Likely multifactorial including missed dialysis with volume overload versus anxiety -CXR: Cardiomegaly without evidence of active cardiopulmonary disease -VQ scan: very low probability for PE  Diarrhea -Per patient, has resolved (patient had one episode of diarrhea in the emergency department) -Possibly secondary to laxative -C. difficile PCR 12/8 neg  Pyelonephritis unclear source -UA: WBC TNTC, many bacteria, large leukocytes -Rocephin d/c 06/18/14 as has received 4 days IV therpay -urine culture not received at admission  ESRD on hemodialysis -Patient dialyzes on Tuesday, Thursday, Saturday -Nephrology consulted and appreciated -Patient was scheduled for shuntogram with Dr. Darrick Pennafields 12/4, however canceled -Spoke with Dr. Darrick PennaFields, patient  Reevaluated and working shunt -Spoke with daughter regarding this, she agreed to have palliative care consult.  Asthma -Currently stable, patient is not coughing or wheezing at this time  Chronic anemia with acute component of ecchymosis 12/10 -Likely secondary to ESRD -2 U PRBC trasnfused -Continue aranesp and IV Iron  Lower extremity DVT 05/2014 with supratherapeutic INR -Coumadin held, INR 1.6 today -heparin d/c 12/9  Hypotension -Continue Midodrine -still hypotensive with dialysis   Severe malnutrition -continue feeding supplements -Nutrition consulted  Goals of care -Palliative care consulted and appreciated.  Code Status: Full  Family Communication: None at  bedside, have spoken with daughter via phone 12/10  Disposition Plan: Admitted  Time Spent in minutes   20 minutes  Procedures  None  Consults   Nephrology Vascular Surgery, Dr. Darrick Penna Palliative care  DVT Prophylaxis  Heparin   Lab Results  Component Value Date   PLT 166 06/19/2014    Medications  Scheduled Meds: . albuterol  3 mL Inhalation QHS  . cefTRIAXone (ROCEPHIN)  IV  1 g Intravenous Q24H    . cinacalcet  30 mg Oral Q breakfast  . darbepoetin (ARANESP) injection - DIALYSIS  200 mcg Intravenous Q Thu-HD  . feeding supplement (NEPRO CARB STEADY)  237 mL Oral BID BM  . ferric gluconate (FERRLECIT/NULECIT) IV  125 mg Intravenous Q T,Th,Sa-HD  . midodrine  10 mg Oral TID WC  . multivitamin  1 tablet Oral Daily  . sevelamer carbonate  800 mg Oral TID WC  . sodium chloride  3 mL Intravenous Q12H  . warfarin  7.5 mg Oral ONCE-1800  . Warfarin - Pharmacist Dosing Inpatient   Does not apply q1800   Continuous Infusions:   PRN Meds:.acetaminophen **OR** acetaminophen, albuterol, LORazepam, nitroGLYCERIN, ondansetron **OR** ondansetron (ZOFRAN) IV, traMADol  Antibiotics   Anti-infectives    Start     Dose/Rate Route Frequency Ordered Stop   06/13/14 0500  cefTRIAXone (ROCEPHIN) 1 g in dextrose 5 % 50 mL IVPB - Premix     1 g100 mL/hr over 30 Minutes Intravenous Every 24 hours 06/13/14 0437          Subjective:   Very confused. Poor sleep last pm Cannot tell me how she feels Noted ecchymosis and transfused today.  Objective:   Filed Vitals:   06/19/14 1108 06/19/14 1115 06/19/14 1130 06/19/14 1155  BP: 128/40 107/57 112/51 110/62  Pulse: 74 70 71 82  Temp: 97.9 F (36.6 C) 97.2 F (36.2 C) 97.3 F (36.3 C) 97.2 F (36.2 C)  TempSrc: Oral Oral Oral Oral  Resp: 13 14 15 14   Weight:    53.1 kg (117 lb 1 oz)  SpO2:    95%    Wt Readings from Last 3 Encounters:  06/19/14 53.1 kg (117 lb 1 oz)  06/09/14 55.6 kg (122 lb 9.2 oz)  04/10/14 56.7 kg (125 lb)     Intake/Output Summary (Last 24 hours) at 06/19/14 1515 Last data filed at 06/19/14 1329  Gross per 24 hour  Intake    343 ml  Output   1371 ml  Net  -1028 ml    Exam  General: Well developed, well nourished, NAD, appears stated age  HEENT: NCAT, mucous membranes moist.   Cardiovascular: S1 S2 auscultated, 2/6 SEM, RRR  Respiratory: Clear to auscultation bilaterally with equal chest rise  Data  Review   Micro Results Recent Results (from the past 240 hour(s))  Clostridium Difficile by PCR     Status: None   Collection Time: 06/17/14  6:00 PM  Result Value Ref Range Status   C difficile by pcr NEGATIVE NEGATIVE Final    Radiology Reports Ct Head Wo Contrast  06/05/2014   CLINICAL DATA:  Intermittent confusion associated with hypertension.  EXAM: CT HEAD WITHOUT CONTRAST  TECHNIQUE: Contiguous axial images were obtained from the base of the skull through the vertex without intravenous contrast.  COMPARISON:  None.  FINDINGS: Skull and Sinuses:Negative for fracture or destructive process. The mastoids, middle ears, and imaged paranasal sinuses are clear.  Orbits: No acute abnormality.  Brain: No evidence of  acute abnormality, such as acute infarction, hemorrhage, hydrocephalus, or mass lesion/mass effect. There is generalized volume loss consistent with atrophy. Mild chronic small vessel disease with ischemic gliosis noted around the frontal horn of the left lateral ventricle.  IMPRESSION: 1. No acute intracranial disease. 2. Brain atrophy and mild chronic small vessel ischemia.   Electronically Signed   By: Tiburcio Pea M.D.   On: 06/05/2014 03:17   Nm Pulmonary Perf And Vent  06/15/2014   CLINICAL DATA:  80 year old female dialysis patient presenting with chest pain.  EXAM: NUCLEAR MEDICINE VENTILATION - PERFUSION LUNG SCAN  TECHNIQUE: Ventilation images were obtained in multiple projections using inhaled aerosol technetium 99 M DTPA. Perfusion images were obtained in multiple projections after intravenous injection of Tc-14m MAA.  RADIOPHARMACEUTICALS:  30.0 mCi Tc-10m DTPA aerosol and 5.0 mCi Tc-92m MAA  COMPARISON:  No priors.  FINDINGS: Ventilation: Markedly heterogeneous ventilation with large amount of deposition of radiotracer in the hilar regions bilaterally.  Perfusion: No wedge shaped peripheral perfusion defects to suggest acute pulmonary embolism.  IMPRESSION: 1. Very low  probability (0-9%) for pulmonary embolism.   Electronically Signed   By: Trudie Reed M.D.   On: 06/15/2014 10:26   Dg Chest Portable 1 View  06/13/2014   CLINICAL DATA:  Acute shortness of breath.  Initial encounter.  EXAM: PORTABLE CHEST - 1 VIEW  COMPARISON:  06/04/2014 prior radiographs  FINDINGS: Cardiomegaly and mild left basilar scarring again noted.  There is no evidence of focal airspace disease, pulmonary edema, suspicious pulmonary nodule/mass, pleural effusion, or pneumothorax. No acute bony abnormalities are identified.  IMPRESSION: Cardiomegaly without evidence of active cardiopulmonary disease.   Electronically Signed   By: Laveda Abbe M.D.   On: 06/13/2014 01:06   Dg Chest Port 1 View  06/04/2014   CLINICAL DATA:  Asthma and hypertension.  Shortness of breath  EXAM: PORTABLE CHEST - 1 VIEW  COMPARISON:  05/10/2014  FINDINGS: Normal heart size. No pleural effusion or edema. Lung volumes are low. No airspace consolidation. Coarsened interstitial markings are identified bilaterally.  IMPRESSION: 1. Low lung volumes. 2. No acute findings noted.   Electronically Signed   By: Signa Kell M.D.   On: 06/04/2014 23:45    CBC  Recent Labs Lab 06/13/14 0037  06/13/14 0600  06/15/14 0442 06/16/14 0101 06/17/14 0545 06/18/14 0134 06/19/14 0500  WBC 10.1  --  9.4  < > 7.0 10.2 10.4 12.0* 10.7*  HGB 9.6*  < > 8.9*  < > 7.5* 7.6* 7.7* 7.5* 6.5*  HCT 30.8*  < > 28.5*  < > 24.8* 25.2* 24.7* 24.1* 21.0*  PLT 217  --  207  < > 157 201 165 170 166  MCV 95.4  --  95.6  < > 94.7 95.1 97.2 95.6 93.3  MCH 29.7  --  29.9  < > 28.6 28.7 30.3 29.8 28.9  MCHC 31.2  --  31.2  < > 30.2 30.2 31.2 31.1 31.0  RDW 16.3*  --  16.4*  < > 16.7* 17.1* 17.4* 17.1* 17.7*  LYMPHSABS 4.5*  --  0.9  --   --   --   --   --   --   MONOABS 0.5  --  0.1  --   --   --   --   --   --   EOSABS 0.0  --  0.0  --   --   --   --   --   --  BASOSABS 0.0  --  0.0  --   --   --   --   --   --   < > = values in this  interval not displayed.  Chemistries   Recent Labs Lab 06/13/14 0600  06/15/14 0442 06/16/14 0101 06/17/14 0545 06/18/14 0134 06/19/14 0500  NA 138  < > 141 141 138 138 137  K 4.0  < > 4.3 4.3 4.2 3.5* 3.5*  CL 99  < > 103 101 99 97 98  CO2 22  < > 29 27 24 28 26   GLUCOSE 140*  < > 92 73 81 70 70  BUN 22  < > 8 16 24* 9 17  CREATININE 4.92*  < > 2.31* 3.40* 4.64* 2.49* 4.18*  CALCIUM 8.5  < > 8.1* 8.5 8.5 8.4 8.3*  MG  --   --   --   --   --   --  1.8  AST 19  --   --   --   --   --  12  ALT 9  --   --   --   --   --  7  ALKPHOS 58  --   --   --   --   --  61  BILITOT 0.3  --   --   --   --   --  0.4  < > = values in this interval not displayed. ------------------------------------------------------------------------------------------------------------------ estimated creatinine clearance is 7.8 mL/min (by C-G formula based on Cr of 4.18). ------------------------------------------------------------------------------------------------------------------ No results for input(s): HGBA1C in the last 72 hours. ------------------------------------------------------------------------------------------------------------------ No results for input(s): CHOL, HDL, LDLCALC, TRIG, CHOLHDL, LDLDIRECT in the last 72 hours. ------------------------------------------------------------------------------------------------------------------ No results for input(s): TSH, T4TOTAL, T3FREE, THYROIDAB in the last 72 hours.  Invalid input(s): FREET3 ------------------------------------------------------------------------------------------------------------------ No results for input(s): VITAMINB12, FOLATE, FERRITIN, TIBC, IRON, RETICCTPCT in the last 72 hours.  Coagulation profile  Recent Labs Lab 06/15/14 0442 06/16/14 0101 06/17/14 0545 06/18/14 0134 06/19/14 0750  INR 2.05* 1.90* 1.72* 1.60* 1.54*    No results for input(s): DDIMER in the last 72 hours.  Cardiac Enzymes  Recent  Labs Lab 06/13/14 0600  TROPONINI <0.30   ------------------------------------------------------------------------------------------------------------------ Invalid input(s): POCBNP    Pleas KochJai Rahshawn Remo, MD [facility] [phone]

## 2014-06-19 NOTE — Progress Notes (Signed)
Progress Note from the Palliative Medicine Team at [facility]  Subjective: I spoke with [name] today briefly during dialysis. She is tolerating well so far and is probably at least halfway through treatment with no complications or agitation at this time. She continues to be confused. I see that she was given lorazepam prior to treatment - appreciate nursing help with this. No other complaints from Ms. Brittle except she wants to go home and she is tired of lying in bed. Asked the sitter to help nursing get her up in chair after dialysis. Consider PT consult as well.     Objective: Allergies  Allergen Reactions  . Nsaids     REACTION: Avoid NSAIDS(renal failure)   Scheduled Meds: . albuterol  3 mL Inhalation QHS  . cefTRIAXone (ROCEPHIN)  IV  1 g Intravenous Q24H  . cinacalcet  30 mg Oral Q breakfast  . darbepoetin (ARANESP) injection - DIALYSIS  200 mcg Intravenous Q Thu-HD  . feeding supplement (NEPRO CARB STEADY)  237 mL Oral BID BM  . ferric gluconate (FERRLECIT/NULECIT) IV  125 mg Intravenous Q T,Th,Sa-HD  . midodrine  10 mg Oral TID WC  . multivitamin  1 tablet Oral Daily  . sevelamer carbonate  800 mg Oral TID WC  . sodium chloride  3 mL Intravenous Q12H  . Warfarin - Pharmacist Dosing Inpatient   Does not apply q1800   Continuous Infusions:  PRN Meds:.acetaminophen **OR** acetaminophen, albuterol, LORazepam, nitroGLYCERIN, ondansetron **OR** ondansetron (ZOFRAN) IV, traMADol  BP 98/74 mmHg  Pulse 92  Temp(Src) 97.7 F (36.5 C) (Oral)  Resp 16  Wt 53.1 kg (117 lb 1 oz)  SpO2 100%   PPS: 30%   Intake/Output Summary (Last 24 hours) at 06/19/14 1816 Last data filed at 06/19/14 1700  Gross per 24 hour  Intake 546.63 ml  Output   1371 ml  Net -824.37 ml      LBM: 06/17/14  Physical Exam:  General: NAD, thin, frail HEENT: Temporal muscle wasting, no JVD, moist mucous membranes Chest: CTA throughout, symmetric, no labored breathin CVS: RRR, murmur Abdomen:  Soft, NT, ND, +BS Ext: MAE, no edema, warm to touch Neuro: Awake, alert, oriented to person only, very confused in general    Labs: CBC    Component Value Date/Time   WBC 10.7* 06/19/2014 0500   RBC 2.25* 06/19/2014 0500   HGB 6.5* 06/19/2014 0500   HCT 21.0* 06/19/2014 0500   PLT 166 06/19/2014 0500   MCV 93.3 06/19/2014 0500   MCH 28.9 06/19/2014 0500   MCHC 31.0 06/19/2014 0500   RDW 17.7* 06/19/2014 0500   LYMPHSABS 0.9 06/13/2014 0600   MONOABS 0.1 06/13/2014 0600   EOSABS 0.0 06/13/2014 0600   BASOSABS 0.0 06/13/2014 0600    BMET    Component Value Date/Time   NA 137 06/19/2014 0500   K 3.5* 06/19/2014 0500   CL 98 06/19/2014 0500   CO2 26 06/19/2014 0500   GLUCOSE 70 06/19/2014 0500   BUN 17 06/19/2014 0500   CREATININE 4.18* 06/19/2014 0500   CALCIUM 8.3* 06/19/2014 0500   CALCIUM 9.6 04/10/2014 0917   GFRNONAA 9* 06/19/2014 0500   GFRAA 11* 06/19/2014 0500    CMP     Component Value Date/Time   NA 137 06/19/2014 0500   K 3.5* 06/19/2014 0500   CL 98 06/19/2014 0500   CO2 26 06/19/2014 0500   GLUCOSE 70 06/19/2014 0500   BUN 17 06/19/2014 0500   CREATININE 4.18* 06/19/2014  0500   CALCIUM 8.3* 06/19/2014 0500   CALCIUM 9.6 04/10/2014 0917   PROT 5.4* 06/19/2014 0500   ALBUMIN 2.1* 06/19/2014 0500   AST 12 06/19/2014 0500   ALT 7 06/19/2014 0500   ALKPHOS 61 06/19/2014 0500   BILITOT 0.4 06/19/2014 0500   GFRNONAA 9* 06/19/2014 0500   GFRAA 11* 06/19/2014 0500    Assessment and Plan: 1. Code Status: DNR 2. Symptom Control: 1. Confusion/agitation: This is worse during dialysis and at night.  1. Continue lorazepam every 12 hours prn and could plan a dose to PREMEDICATE FOR DIALYSIS. 2. Sleep disturbance/agitation: Consider a trial of TRAZODONE 25 MG at night.  2. Bowel Regimen: Recommend colace scheduled and possibly senokot.  3. Pain: Tramadol 50 mg every 6 hours prn pain.  4. Decreased appetite: Continue Nepro BID. Encourage small  frequent meals.  3. Psycho/Social: Emotional support provided to patient - no family at bedside.  4. Disposition: Likely home when stable. Consider outpatient palliative care to follow how she is doing with dialysis.    Patient Documents Completed or Given: Document Given Completed  Advanced Directives Pkt    MOST    DNR    Gone from My Sight    Hard Choices      Time In Time Out Total Time Spent with Patient Total Overall Time  0910 0930 15min 20min    Greater than 50%  of this time was spent counseling and coordinating care related to the above assessment and plan.  Yong ChannelAlicia Zair Borawski, NP Palliative Medicine Team Pager # [phone] (M-F 8a-5p) Team Phone # [phone] (Nights/Weekends)

## 2014-06-19 NOTE — Procedures (Signed)
Tolerating hemodialysis via LUE AVF. BFR 400cc/min.  Large hematoma upper arm which is now ecchymotic. Shealynn Saulnier C

## 2014-06-19 NOTE — Progress Notes (Signed)
ANTICOAGULATION CONSULT NOTE - Follow Up Consult  Pharmacy Consult for Heparin, Coumadin Indication: hx DVT  Allergies  Allergen Reactions  . Nsaids     REACTION: Avoid NSAIDS(renal failure)    Patient Measurements: Weight: 119 lb 11.4 oz (54.3 kg)  Height: 5' IBW: 45.5 kg Heparin Dosing Weight: 57 kg  Labs:  Recent Labs  06/17/14 0545 06/18/14 0134 06/18/14 1027 06/18/14 2157 06/19/14 0500 06/19/14 0750  HGB 7.7* 7.5*  --   --  6.5*  --   HCT 24.7* 24.1*  --   --  21.0*  --   PLT 165 170  --   --  166  --   LABPROT 20.3* 19.2*  --   --   --  18.6*  INR 1.72* 1.60*  --   --   --  1.54*  HEPARINUNFRC 0.18* 0.77* 0.72* 0.63  --   --   CREATININE 4.64* 2.49*  --   --  4.18*  --     Assessment: -81 YOF admitted to the ER with SOB from fluid overload on chronic Coumadin for hx of DVT  -Heparin level this was not drawn prior to HD. Currently on 800 units/hr. Last level was therapeutic -Fistulogram was cancelled.  -Resumed Coumadin on 12/9. INR 1.54 today. Home Coumadin dose is 5 mg daily    Goal of Therapy:  Heparin level 0.3-0.7 units/ml Monitor platelets by anticoagulation protocol: Yes  INR 2-3    Plan:  Continue heparin 800 units/hr. F/u 8 hr HL after HD  Repeat Coumadin 7.5 mg tonight  Daily HL with AM labs Daily PT/INR and CBC  Stop heparin infusion when INR is therapeutic    Vinnie LevelBenjamin Imanol Bihl, PharmD., BCPS Clinical Pharmacist Pager [phone]

## 2014-06-19 NOTE — Progress Notes (Signed)
Subjective:  Sleeping, but passively cooperative, no complaints  Objective: Vital signs in last 24 hours: Temp:  [97 F (36.1 C)-98.9 F (37.2 C)] 98.9 F (37.2 C) (12/10 0522) Pulse Rate:  [78-88] 80 (12/10 0522) Resp:  [18-20] 20 (12/10 0522) BP: (95-106)/(51-67) 95/51 mmHg (12/10 0522) SpO2:  [98 %-100 %] 100 % (12/10 0522) Weight:  [55.1 kg (121 lb 7.6 oz)] 55.1 kg (121 lb 7.6 oz) (12/10 0522) Weight change: 1.1 kg (2 lb 6.8 oz)  Intake/Output from previous day: 12/09 0701 - 12/10 0700 In: 290 [P.O.:290] Out: 0  Intake/Output this shift:   Lab Results:  Recent Labs  06/17/14 0545 06/18/14 0134  WBC 10.4 12.0*  HGB 7.7* 7.5*  HCT 24.7* 24.1*  PLT 165 170   BMET:  Recent Labs  06/17/14 0545 06/18/14 0134  NA 138 138  K 4.2 3.5*  CL 99 97  CO2 24 28  GLUCOSE 81 70  BUN 24* 9  CREATININE 4.64* 2.49*  CALCIUM 8.5 8.4   No results for input(s): PTH in the last 72 hours. Iron Studies: No results for input(s): IRON, TIBC, TRANSFERRIN, FERRITIN in the last 72 hours.  Studies/Results: No results found.   EXAM: General appearance: Alert, in no apparent distress  Resp: CTA without rales, rhonchi, or wheezes Cardio: RRR without murmur or rub GI: + BS, soft and nontender Extremities: L arm swelling @ AVF and distally slightly better, R arm wrapped, no LE edema Access: AVF @ LUA with + bruit, swelling slightly better  Dialysis: [facility] TTS  4 hr 56 kg std heparin 4 K 2.25 Ca 400/800 left AVF  Calcitriol 0.5 on hold , Aranesp 40 q Sat, venofer 100 through 12/10  Assessment/Plan:  1. SOB - resolved. 2. Confusion/AMS - possibly underlying dementia and/or psychosis, also with E coli UTI, on Rocephin. 3. AVF infiltration - successfully used for HD, fistulogram cancelled. 4. ESRD - HD on TTS @ [facility], K 3.5. HD pending. 5. HTN/Volume - BP 95/51, on Midodrine 10 mg tid; wt 55.1 kg, below EDW. 6. Anemia - Hgb 7.5, Aranesp increased to 200 mcg on Thurs, Fe per HD  through 12/10. 7. Sec HPT - Ca 8.4, Calcitriol on hold, Sensipar 30 mg qd, Renvela 1 with meals. 8. Nutrition - renal diet, vitamin. 9. New DVT - 05/2014, restarted Coumadin yesterday after cancelled fistulogram, INR 1.6 yesterday. 10. DNR  LOS: 7 days   LYLES,CHARLES 06/19/2014,7:05 AM   Renal Attending: Has left arm hematoma which may explain the ABLA.  We will plan to give PRBCs with treatment.  Hopefully no other bleeding given need for warfarin. Anivea Velasques C

## 2014-06-19 NOTE — Plan of Care (Signed)
Problem: Phase II Progression Outcomes Goal: Progress activity as tolerated unless otherwise ordered Outcome: Completed/Met Date Met:  06/19/14 Goal: Obtain order to discontinue catheter if appropriate Outcome: Not Applicable Date Met:  98/42/10

## 2014-06-20 ENCOUNTER — Encounter (HOSPITAL_COMMUNITY)
Admission: EM | Disposition: A | Discharge status: Skilled Nursing Facility | Payer: Self-pay | Source: Home / Self Care | Attending: Family Medicine

## 2014-06-20 LAB — CBC
HCT: 31.6 % — ABNORMAL LOW (ref 36.0–46.0)
Hemoglobin: 10.2 g/dL — ABNORMAL LOW (ref 12.0–15.0)
MCH: 30.8 pg (ref 26.0–34.0)
MCHC: 32.3 g/dL (ref 30.0–36.0)
MCV: 95.5 fL (ref 78.0–100.0)
Platelets: 163 10*3/uL (ref 150–400)
RBC: 3.31 MIL/uL — ABNORMAL LOW (ref 3.87–5.11)
RDW: 17.3 % — AB (ref 11.5–15.5)
WBC: 8.9 10*3/uL (ref 4.0–10.5)

## 2014-06-20 LAB — TYPE AND SCREEN
ABO/RH(D): O POS
ANTIBODY SCREEN: NEGATIVE
Unit division: 0
Unit division: 0

## 2014-06-20 LAB — PROTIME-INR
INR: 1.68 — ABNORMAL HIGH (ref 0.00–1.49)
Prothrombin Time: 19.9 seconds — ABNORMAL HIGH (ref 11.6–15.2)

## 2014-06-20 LAB — HEPARIN LEVEL (UNFRACTIONATED)

## 2014-06-20 SURGERY — ASSESSMENT, SHUNT FUNCTION, WITH CONTRAST RADIOGRAPHIC STUDY
Anesthesia: LOCAL | Laterality: Left

## 2014-06-20 MED ORDER — WARFARIN SODIUM 7.5 MG PO TABS
7.5000 mg | ORAL_TABLET | Freq: Once | ORAL | Status: AC
  Administered 2014-06-20: 7.5 mg via ORAL
  Filled 2014-06-20: qty 1

## 2014-06-20 NOTE — Progress Notes (Signed)
Subjective:   Awake, no complaints.   Objective Filed Vitals:   06/19/14 1700 06/19/14 2045 06/19/14 2233 06/20/14 0645  BP: 98/74  93/49 118/48  Pulse: 92  60 105  Temp: 97.7 F (36.5 C)  99.7 F (37.6 C) 97.9 F (36.6 C)  TempSrc: Oral  Oral Oral  Resp: 16  18 16   Weight:      SpO2: 100% 96% 100% 92%   Physical Exam General: awake, sitter at bedside. No acute distress.  Heart: RRR Lungs: CTA, unlabored  Abdomen: soft, nontender +BS Extremities: no LE edema.  Dialysis Access: LUA AVF +b/t - L arm swelling   Dialysis: [facility]  4 hr 56 kg std heparin 4 K 2.25 Ca 400/800 left AVF  Calcitriol 0.5 on hold , Aranesp 40 q Sat, venofer 100 through 12/10  Assessment/Plan: 1. SOB - resolved. 2. Confusion/AMS - possibly underlying dementia and/or psychosis, also with E coli UTI, on Rocephin. 3. AVF infiltration with arm hematoma - successfully used for HD, fistulogram cancelled. 4. ESRD - HD on TTS @ [facility], K 3.5. HD pending. 5. HTN/Volume - BP 118/481, on Midodrine 10 mg tid; wt 53. 3 kg, below EDW. 6. Anemia - Hgb 10.2, Aranesp increased to 200 mcg on Thurs, Fe per HD through 12/10. 7. Sec HPT - Ca 8.4/9.9 corrected. Calcitriol on hold, Sensipar 30 mg qd, Renvela 1 with meals. 8. Nutrition - renal diet, vitamin. Alb 2.1 reports eating well. nepro 9. New DVT - 05/2014, restarted Coumadin 12/9 after cancelled fistulogram, INR 1.68  10. DNR- palliative care following  Jetty DuhamelBridget Whelan, NP [facility] [phone] 06/20/2014,8:50 AM  LOS: 8 days    Renal Attending: Recent PRBCs for ABLA presumed due to AVF hematoma while anticoagulated.  SHe is mentally yhe best I have seen her. [facility]    Additional Objective Labs: Basic Metabolic Panel:  Recent Labs Lab 06/14/14 1717  06/17/14 0545 06/18/14 0134 06/19/14 0500  NA 138  < > 138 138 137  K 3.7  < > 4.2 3.5* 3.5*  CL 99  < > 99 97 98  CO2 27  < > 24 28 26   GLUCOSE 84  < > 81  70 70  BUN 28*  < > 24* 9 17  CREATININE 5.42*  < > 4.64* 2.49* 4.18*  CALCIUM 8.3*  < > 8.5 8.4 8.3*  PHOS 3.3  --   --   --  3.4  < > = values in this interval not displayed. Liver Function Tests:  Recent Labs Lab 06/14/14 1717 06/19/14 0500  AST  --  12  ALT  --  7  ALKPHOS  --  61  BILITOT  --  0.4  PROT  --  5.4*  ALBUMIN 2.2* 2.1*   No results for input(s): LIPASE, AMYLASE in the last 168 hours. CBC:  Recent Labs Lab 06/16/14 0101 06/17/14 0545 06/18/14 0134 06/19/14 0500 06/20/14 0514  WBC 10.2 10.4 12.0* 10.7* 8.9  HGB 7.6* 7.7* 7.5* 6.5* 10.2*  HCT 25.2* 24.7* 24.1* 21.0* 31.6*  MCV 95.1 97.2 95.6 93.3 95.5  PLT 201 165 170 166 163   Blood Culture    Component Value Date/Time   SDES URINE, CATHETERIZED 06/05/2014 0014   SPECREQUEST NONE 06/05/2014 0014   CULT  06/05/2014 0014    ESCHERICHIA COLI Performed at [facility]    REPTSTATUS 06/07/2014 FINAL 06/05/2014 0014    Cardiac Enzymes: No results for input(s): CKTOTAL, CKMB,  CKMBINDEX, TROPONINI in the last 168 hours. CBG: No results for input(s): GLUCAP in the last 168 hours. Iron Studies: No results for input(s): IRON, TIBC, TRANSFERRIN, FERRITIN in the last 72 hours. @lablastinr3 @ Studies/Results: No results found. Medications:   . albuterol  3 mL Inhalation QHS  . cefTRIAXone (ROCEPHIN)  IV  1 g Intravenous Q24H  . cinacalcet  30 mg Oral Q breakfast  . darbepoetin (ARANESP) injection - DIALYSIS  200 mcg Intravenous Q Thu-HD  . feeding supplement (NEPRO CARB STEADY)  237 mL Oral BID BM  . ferric gluconate (FERRLECIT/NULECIT) IV  125 mg Intravenous Q T,Th,Sa-HD  . midodrine  10 mg Oral TID WC  . multivitamin  1 tablet Oral Daily  . sevelamer carbonate  800 mg Oral TID WC  . sodium chloride  3 mL Intravenous Q12H  . Warfarin - Pharmacist Dosing Inpatient   Does not apply [phone]

## 2014-06-20 NOTE — Clinical Social Work Psychosocial (Addendum)
Clinical Social Work Department BRIEF PSYCHOSOCIAL ASSESSMENT 06/20/2014  Patient:  [name],[name]     Account Number:  [id number]     Admit date:  06/12/2014  Clinical Social Worker:  Delmer IslamRAWFORD,Lanis Storlie, LCSW  Date/Time:  06/20/2014 12:29 PM  Referred by:  Physician  Date Referred:  06/20/2014 Referred for  SNF Placement   Other Referral:   Interview type:  Family Other interview type:    PSYCHOSOCIAL DATA Living Status:  WITH ADULT CHILDREN Admitted from facility:   Level of care:   Primary support name:  [name] Primary support relationship to patient:  CHILD, ADULT Degree of support available:   Patient and daughter, Ms. Fata [phone]) live together.    CURRENT CONCERNS Current Concerns  Post-Acute Placement   Other Concerns:    SOCIAL WORK ASSESSMENT / PLAN CSW talked by phone with patient's daughter, [name] OverlandMichelle Page regarding d/c planning and recommendation by MD for facility placement. When asked, daughter responded that there is no is no one that can provide 24/7 care for patient at home. Patient is in agreement with short-term rehab at a skilled facility. Daughter also informed CSW that her mother has not been to facility before for short-term rehab. CSW explained facility search process and informed daughter that the SNF list will be left in patient's room. [name] informed CSW that she will be coming to hospital and her sister [name] will also be coming later today.   Assessment/plan status:  Psychosocial Support/Ongoing Assessment of Needs Other assessment/ plan:   Information/referral to community resources:   [facility].    PATIENT'S/FAMILY'S RESPONSE TO PLAN OF CARE: [name] was receptive to talking with CSW and is in agreement with short-term rehab for patient.      [name], MSW, LCSW Clinical Social Work Department [facility] [phone]

## 2014-06-20 NOTE — Evaluation (Signed)
Physical Therapy Evaluation Patient Details Name: [name] MRN: [id number] DOB: December 10, 1932 Today's Date: 06/20/2014   History of Present Illness  Pt is a 80 y.o. female with a history of ESRD on hemodialysis on Tuesday Thursday and Saturday was brought to the ER after patient was complaining of shortness of breath. As per the family patient did not have dialysis day before admission because patient had multiple episodes of diarrhea.   Clinical Impression  Pt admitted with above diagnosis. Pt currently with functional limitations due to the deficits listed below (see PT Problem List). At the time of PT eval pt was able to perform transfers and ambulation with RW and close guarding for safety. Pt will benefit from skilled PT to increase their independence and safety with mobility to allow discharge to the venue listed below.  Pt's daughter was present during session and states that she has been providing max assist for toileting, bathing, and walking up until hospital admission. Appears that pt has improved however feel that pt could benefit from STR at the SNF level prior to returning home.      Follow Up Recommendations SNF;Supervision/Assistance - 24 hour    Equipment Recommendations  None recommended by PT    Recommendations for Other Services       Precautions / Restrictions Precautions Precautions: Fall Restrictions Weight Bearing Restrictions: No      Mobility  Bed Mobility Overal bed mobility: Needs Assistance Bed Mobility: Supine to Sit     Supine to sit: Supervision     General bed mobility comments: Supervision for safety, cues for technique, no assist needed.  Transfers Overall transfer level: Needs assistance Equipment used: Rolling walker (2 wheeled) Transfers: Sit to/from Stand Sit to Stand: Supervision         General transfer comment: Supervision for safety. VC for hand placement. Performed from lowest bed setting. Did not require physical  assist.  Ambulation/Gait Ambulation/Gait assistance: Min assist;Min guard Ambulation Distance (Feet): 100 Feet Assistive device: Rolling walker (2 wheeled);None Gait Pattern/deviations: Step-through pattern;Decreased stride length;Narrow base of support Gait velocity: Decreased Gait velocity interpretation: Below normal speed for age/gender General Gait Details: Pt moving very slowly. Initially was walking without AD and required min assist for steadying support. With RW, pt did not require any assistance however close guard was still provided for safety.   Stairs            Wheelchair Mobility    Modified Rankin (Stroke Patients Only)       Balance Overall balance assessment: Needs assistance Sitting-balance support: No upper extremity supported;Feet supported Sitting balance-Leahy Scale: Fair     Standing balance support: No upper extremity supported Standing balance-Leahy Scale: Fair Standing balance comment: Static standing pt does not require assist, however with dynamic movement pt requires assistance or UE support.                              Pertinent Vitals/Pain Pain Assessment: No/denies pain    Home Living Family/patient expects to be discharged to:: Skilled nursing facility Living Arrangements: Children                    Prior Function Level of Independence: Needs assistance         Comments: Per daughter, pt was max assist for toileting, bathing, etc.      Hand Dominance   Dominant Hand: Right    Extremity/Trunk Assessment   Upper Extremity  Assessment: Defer to OT evaluation           Lower Extremity Assessment: Generalized weakness      Cervical / Trunk Assessment: Normal  Communication   Communication: No difficulties  Cognition Arousal/Alertness: Awake/alert Behavior During Therapy: WFL for tasks assessed/performed Overall Cognitive Status: Within Functional Limits for tasks assessed       Memory:  Decreased short-term memory              General Comments      Exercises        Assessment/Plan    PT Assessment Patient needs continued PT services  PT Diagnosis Generalized weakness;Difficulty walking   PT Problem List Decreased strength;Decreased range of motion;Decreased activity tolerance;Decreased balance;Decreased mobility;Decreased knowledge of use of DME;Decreased safety awareness;Decreased knowledge of precautions  PT Treatment Interventions DME instruction;Gait training;Stair training;Functional mobility training;Therapeutic activities;Therapeutic exercise;Neuromuscular re-education;Patient/family education   PT Goals (Current goals can be found in the Care Plan section) Acute Rehab PT Goals Patient Stated Goal: Go home PT Goal Formulation: With patient/family Time For Goal Achievement: 07/04/14 Potential to Achieve Goals: Good    Frequency Min 2X/week   Barriers to discharge        Co-evaluation               End of Session Equipment Utilized During Treatment: Gait belt Activity Tolerance: Patient tolerated treatment well Patient left: in chair;with call bell/phone within reach;with family/visitor present Nurse Communication: Mobility status         Time: [id number] PT Time Calculation (min) (ACUTE ONLY): 26 min   Charges:   PT Evaluation $Initial PT Evaluation Tier I: 1 Procedure PT Treatments $Gait Training: 8-22 mins $Therapeutic Activity: 8-22 mins   PT G Codes:          [name], [name] 06/20/2014, 5:08 PM  [name], PT, DPT Acute Rehabilitation Services Pager: [phone]

## 2014-06-20 NOTE — Progress Notes (Signed)
ANTICOAGULATION CONSULT NOTE - Follow Up Consult  Pharmacy Consult for Coumadin Indication: hx DVT  Allergies  Allergen Reactions  . Nsaids     REACTION: Avoid NSAIDS(renal failure)    Patient Measurements: Weight: 117 lb 1 oz (53.1 kg)  Height: 5' IBW: 45.5 kg   Labs:  Recent Labs  06/18/14 0134  06/19/14 0500 06/19/14 0750 06/19/14 1954 06/20/14 0514  HGB 7.5*  --  6.5*  --   --  10.2*  HCT 24.1*  --  21.0*  --   --  31.6*  PLT 170  --  166  --   --  163  LABPROT 19.2*  --   --  18.6*  --  19.9*  INR 1.60*  --   --  1.54*  --  1.68*  HEPARINUNFRC 0.77*  < >  --  0.53 <0.10* <0.10*  CREATININE 2.49*  --  4.18*  --   --   --   < > = values in this interval not displayed.  Assessment: -[age] YOF admitted to the ER with SOB from fluid overload on chronic Coumadin for hx of DVT in 05/2014 -Heparin was stopped on 12/11  -Resumed Coumadin on 12/9. INR 1.68 today. Home Coumadin dose is 5 mg daily  -Pt noted with with large hematoma on upper arm on 12/10 per renal notes. Looks more like generalized bruising today    Goal of Therapy:   Monitor platelets by anticoagulation protocol: Yes  INR 2-3    Plan:  Repeat Coumadin 7.5 mg tonight  Daily PT/INR and CBC     Vinnie LevelBenjamin Faatima Tench, PharmD., BCPS Clinical Pharmacist Pager [phone]

## 2014-06-20 NOTE — Progress Notes (Signed)
Medicare Important Message given? YES  (If response is "NO", the following Medicare IM given date fields will be blank)  Date Medicare IM given:  06/20/2014 Medicare IM given by: Charina Fons  

## 2014-06-20 NOTE — Progress Notes (Signed)
Progress Note from the Palliative Medicine Team at [facility]  Subjective: I spoke with [name]'s daughter, [name] DusterMichelle, today in hallway. We discussed that dialysis went better yesterday but she had a sitter and was premedicated with lorazepam. Stressed to [place] the importance for her or another close family or friend to be with her during dialysis sessions so they know exactly how they went. We discussed that she can use this information to decide when they need to stop dialysis. She also expresses a desire for her mother to go to short term rehab as nursing helped her walk in the halls yesterday. I spoke with Ms. [name] MerlinHickman about short term rehab and she agrees -  although she remains confused.     Objective: Allergies  Allergen Reactions  . Nsaids     REACTION: Avoid NSAIDS(renal failure)   Scheduled Meds: . albuterol  3 mL Inhalation QHS  . cefTRIAXone (ROCEPHIN)  IV  1 g Intravenous Q24H  . cinacalcet  30 mg Oral Q breakfast  . darbepoetin (ARANESP) injection - DIALYSIS  200 mcg Intravenous Q Thu-HD  . feeding supplement (NEPRO CARB STEADY)  237 mL Oral BID BM  . ferric gluconate (FERRLECIT/NULECIT) IV  125 mg Intravenous Q T,Th,Sa-HD  . midodrine  10 mg Oral TID WC  . multivitamin  1 tablet Oral Daily  . sevelamer carbonate  800 mg Oral TID WC  . sodium chloride  3 mL Intravenous Q12H  . Warfarin - Pharmacist Dosing Inpatient   Does not apply q1800   Continuous Infusions:  PRN Meds:.acetaminophen **OR** acetaminophen, albuterol, LORazepam, nitroGLYCERIN, ondansetron **OR** ondansetron (ZOFRAN) IV, traMADol  BP 92/54 mmHg  Pulse 86  Temp(Src) 97.6 F (36.4 C) (Oral)  Resp 15  Wt 53.1 kg (117 lb 1 oz)  SpO2 100%   PPS: 30%   Intake/Output Summary (Last 24 hours) at 06/20/14 1211 Last data filed at 06/20/14 0904  Gross per 24 hour  Intake 556.63 ml  Output    200 ml  Net 356.63 ml      LBM: 06/17/14  Physical Exam:  General: NAD, thin, frail HEENT: Temporal  muscle wasting, no JVD, moist mucous membranes Chest: CTA throughout, symmetric, no labored breathing CVS: RRR, murmur Abdomen: Soft, NT, ND, +BS Ext: MAE, no edema, warm to touch Neuro: Awake, alert, oriented to person only, very confused in general   Labs: CBC    Component Value Date/Time   WBC 8.9 06/20/2014 0514   RBC 3.31* 06/20/2014 0514   HGB 10.2* 06/20/2014 0514   HCT 31.6* 06/20/2014 0514   PLT 163 06/20/2014 0514   MCV 95.5 06/20/2014 0514   MCH 30.8 06/20/2014 0514   MCHC 32.3 06/20/2014 0514   RDW 17.3* 06/20/2014 0514   LYMPHSABS 0.9 06/13/2014 0600   MONOABS 0.1 06/13/2014 0600   EOSABS 0.0 06/13/2014 0600   BASOSABS 0.0 06/13/2014 0600    BMET    Component Value Date/Time   NA 137 06/19/2014 0500   K 3.5* 06/19/2014 0500   CL 98 06/19/2014 0500   CO2 26 06/19/2014 0500   GLUCOSE 70 06/19/2014 0500   BUN 17 06/19/2014 0500   CREATININE 4.18* 06/19/2014 0500   CALCIUM 8.3* 06/19/2014 0500   CALCIUM 9.6 04/10/2014 0917   GFRNONAA 9* 06/19/2014 0500   GFRAA 11* 06/19/2014 0500    CMP     Component Value Date/Time   NA 137 06/19/2014 0500   K 3.5* 06/19/2014 0500   CL 98 06/19/2014 0500  CO2 26 06/19/2014 0500   GLUCOSE 70 06/19/2014 0500   BUN 17 06/19/2014 0500   CREATININE 4.18* 06/19/2014 0500   CALCIUM 8.3* 06/19/2014 0500   CALCIUM 9.6 04/10/2014 0917   PROT 5.4* 06/19/2014 0500   ALBUMIN 2.1* 06/19/2014 0500   AST 12 06/19/2014 0500   ALT 7 06/19/2014 0500   ALKPHOS 61 06/19/2014 0500   BILITOT 0.4 06/19/2014 0500   GFRNONAA 9* 06/19/2014 0500   GFRAA 11* 06/19/2014 0500    Assessment and Plan: 1. Code Status: DNR 2. Symptom Control: 1. Confusion/agitation: This is worse during dialysis and at night.  1. Continue lorazepam every 12 hours prn and PREMEDICATE FOR DIALYSIS. 2. Nighttime agitation was better last night.  2. Bowel Regimen: Recommend colace scheduled and possibly senokot.  3. Pain: Tramadol 50 mg every 6 hours prn  pain.  4. Decreased appetite: Continue Nepro BID. Encourage small frequent meals.  3. Psycho/Social: Emotional support provided to patient.  4. Disposition: SNF rehab with palliative.     Time In Time Out Total Time Spent with Patient Total Overall Time  1115 1135 15min 20min    Greater than 50%  of this time was spent counseling and coordinating care related to the above assessment and plan.  Yong ChannelAlicia Yvonnia Tango, NP Palliative Medicine Team Pager # [phone] (M-F 8a-5p) Team Phone # [phone] (Nights/Weekends)

## 2014-06-20 NOTE — Clinical Social Work Placement (Addendum)
Clinical Social Work Department CLINICAL SOCIAL WORK PLACEMENT NOTE 06/20/2014  Patient:  [name],Seraphim M  Account Number:  [id number] Admit date:  06/12/2014  Clinical Social Worker:  Genelle BalVANESSA Derren Suydam, LCSW  Date/time:  06/20/2014 12:38 PM  Clinical Social Work is seeking post-discharge placement for this patient at the following level of care:   SKILLED NURSING   (*CSW will update this form in Epic as items are completed)   06/20/2014  Patient/family provided with [facility] Department of Clinical Social Work's list of facilities offering this level of care within the geographic area requested by the patient (or if unable, by the patient's family).  06/20/2014  Patient/family informed of their freedom to choose among providers that offer the needed level of care, that participate in Medicare, Medicaid or managed care program needed by the patient, have an available bed and are willing to accept the patient.    Patient/family informed of [facility]' ownership interest in [facility], as well as of the fact that they are under no obligation to receive care at this facility.  PASARR submitted to EDS on 06/09/14 PASARR number received on 06/09/14 - [phone]   FL2 transmitted to all facilities in geographic area requested by pt/family on  06/20/2014 FL2 transmitted to all facilities within larger geographic area on   Patient informed that his/her managed care company has contracts with or will negotiate with  certain facilities, including the following:     Patient/family informed of bed offers received: 06/20/14   Patient chooses bed at: 1st choice [facility] and 2nd choice [facility] . Will send PT evaluation to facilities once completed in EPIC. Physician recommends and patient chooses bed at    [facility]   Patient to be transferred to facility by ambulance Patient and family notified of transfer on 06/23/14 Name of  family member notified: Daughters Wenda OverlandMichelle Tomaro and Rosalia Hammersanya Strough   The following physician request were entered in Epic:  Additional Comments: 12/11/5: Physical therapy worked with patient late Friday afternoon. Patient's daughter [name] in the room and informed CSW that she works as well as her sister Kennon HolterShauneeque and there will be no one at home with patient. MD contacted and updated. 06/20/14: [facility]   Genelle BalVanessa Tige Meas, MSW, LCSW Clinical Social Work Department [facility] [phone]

## 2014-06-20 NOTE — Progress Notes (Signed)
[facility]                                                                              Patient Demographics  [name], is a 80 y.o. female, DOB - 1933-03-09, WUJ:[id number]  Admit date - 06/12/2014   Admitting Physician [name], DO  Outpatient Primary MD for the patient is Dorrene GermanAVBUERE,[name], MD  LOS - 8   Chief Complaint  Patient presents with  . Shortness of Breath      HPI on 06/13/2014 by Dr. Midge MiniumArshad Kakrakandy [name] is a 80 y.o. female with a history of ESRD on hemodialysis on Tuesday Thursday and Saturday was brought to the ER after patient was complaining of shortness of breath. As per the family patient did not have dialysis yesterday because patient had multiple episodes of diarrhea. Patient was recently admitted to the hospital and was diagnosed with right lower extremity DVT and was placed on Coumadin. Patient suppoed to have a shuntogram done 06/13/14 by Dr. Darrick PennaFields  On exam patient initially was tachypneic and tachycardic and chest x-ray was not showing any definite congestion-? there may be a complement of possible mild fluid overload patient has been admitted for observation and get dialysis in the morning.    Assessment & Plan  Acute encephalopathy -Multifactorial causes: dyspnea vs UTI-potentially medication effect as started trazadone and Ativan given which we will hold  -?underlying dementia -Vitamin B12: 359 (low normal), was given B12 injection 06/14/2014 -TSH 0.821 -Spoke with daughter, this may be underlying dementia and patient has not been able to tolerate dialsysis -Palliative care consulted, input appreciated from Ms Jimmey RalphParker- Vicodin probably doesn't metabolize well with ESRD, so have changed patient to Tramadol  Dyspnea -Resolved, Likely multifactorial including missed dialysis with volume overload versus anxiety -CXR: Cardiomegaly without evidence of active cardiopulmonary disease -VQ scan: very low probability for  PE  Diarrhea -Per patient, has resolved (patient had one episode of diarrhea in the emergency department) -Possibly secondary to laxative -C. difficile PCR 12/8 neg  Pyelonephritis unclear source -UA: WBC TNTC, many bacteria, large leukocytes -Rocephin d/c 06/18/14 as has received 4 days IV therpay -urine culture not received at admission  ESRD on hemodialysis -Patient dialyzes on Tuesday, Thursday, Saturday -Nephrology consulted and appreciated -Patient was scheduled for shuntogram with Dr. Darrick Pennafields 12/4, however canceled -Spoke with Dr. Darrick PennaFields, patient  Reevaluated and working shunt -Spoke with daughter regarding this, she agreed to have palliative care consult.  Asthma -Currently stable, patient is not coughing or wheezing at this time  Chronic anemia with acute component of ecchymosis 12/10 -Likely secondary to ESRD -2 U PRBC trasnfused -Hemoglobin stable at 10.2 now -Continue aranesp and IV Iron  Lower extremity DVT 05/2014 with supratherapeutic INR -Coumadin held, INR 1.6 today -heparin d/c 12/9  Hypotension -Continue Midodrine -still hypotensive with dialysis   Severe malnutrition -continue feeding supplements -Nutrition consulted  Goals of care -Palliative care consulted and appreciated.  Code Status: Full  Family Communication: None at bedside, have spoken with daughter via phone 12/10 Main issue now is issues with safety to heself and impulsiveness.  PAtient is medically stable but Needs SNF level care  Disposition Plan: inpatient  Time  Spent in minutes   10 minutes  Procedures  None  Consults   Nephrology Vascular Surgery, Dr. Darrick PennaFields Palliative care  DVT Prophylaxis  Heparin   Lab Results  Component Value Date   PLT 163 06/20/2014    Medications  Scheduled Meds: . albuterol  3 mL Inhalation QHS  . cefTRIAXone (ROCEPHIN)  IV  1 g Intravenous Q24H  . cinacalcet  30 mg Oral Q breakfast  . darbepoetin (ARANESP) injection - DIALYSIS  200 mcg  Intravenous Q Thu-HD  . feeding supplement (NEPRO CARB STEADY)  237 mL Oral BID BM  . ferric gluconate (FERRLECIT/NULECIT) IV  125 mg Intravenous Q T,Th,Sa-HD  . midodrine  10 mg Oral TID WC  . multivitamin  1 tablet Oral Daily  . sevelamer carbonate  800 mg Oral TID WC  . sodium chloride  3 mL Intravenous Q12H  . Warfarin - Pharmacist Dosing Inpatient   Does not apply q1800   Continuous Infusions:   PRN Meds:.acetaminophen **OR** acetaminophen, albuterol, LORazepam, nitroGLYCERIN, ondansetron **OR** ondansetron (ZOFRAN) IV, traMADol  Antibiotics   Anti-infectives    Start     Dose/Rate Route Frequency Ordered Stop   06/13/14 0500  cefTRIAXone (ROCEPHIN) 1 g in dextrose 5 % 50 mL IVPB - Premix     1 g100 mL/hr over 30 Minutes Intravenous Every 24 hours 06/13/14 0437          Subjective:   Alert Talkative Suspicious No other issues currently  Objective:   Filed Vitals:   06/19/14 2045 06/19/14 2233 06/20/14 0645 06/20/14 0904  BP:  93/49 118/48 92/54  Pulse:  60 105 86  Temp:  99.7 F (37.6 C) 97.9 F (36.6 C) 97.6 F (36.4 C)  TempSrc:  Oral Oral Oral  Resp:  18 16 15   Weight:      SpO2: 96% 100% 92% 100%    Wt Readings from Last 3 Encounters:  06/19/14 53.1 kg (117 lb 1 oz)  06/09/14 55.6 kg (122 lb 9.2 oz)  04/10/14 56.7 kg (125 lb)     Intake/Output Summary (Last 24 hours) at 06/20/14 1145 Last data filed at 06/20/14 [id number]  Gross per 24 hour  Intake 556.63 ml  Output   1171 ml  Net -614.37 ml    Exam  General: Well developed, well nourished, NAD, appears stated age  HEENT: NCAT, mucous membranes moist.   Cardiovascular: S1 S2 auscultated, 2/6 SEM, RRR  Respiratory: Clear to auscultation bilaterally with equal chest rise  Area of ecchymosis/bleeding on L forearms look better  Data Review   Micro Results Recent Results (from the past 240 hour(s))  Clostridium Difficile by PCR     Status: None   Collection Time: 06/17/14  6:00 PM  Result  Value Ref Range Status   C difficile by pcr NEGATIVE NEGATIVE Final    Radiology Reports Ct Head Wo Contrast  06/05/2014   CLINICAL DATA:  Intermittent confusion associated with hypertension.  EXAM: CT HEAD WITHOUT CONTRAST  TECHNIQUE: Contiguous axial images were obtained from the base of the skull through the vertex without intravenous contrast.  COMPARISON:  None.  FINDINGS: Skull and Sinuses:Negative for fracture or destructive process. The mastoids, middle ears, and imaged paranasal sinuses are clear.  Orbits: No acute abnormality.  Brain: No evidence of acute abnormality, such as acute infarction, hemorrhage, hydrocephalus, or mass lesion/mass effect. There is generalized volume loss consistent with atrophy. Mild chronic small vessel disease with ischemic gliosis noted around the frontal horn of the left  lateral ventricle.  IMPRESSION: 1. No acute intracranial disease. 2. Brain atrophy and mild chronic small vessel ischemia.   Electronically Signed   By: Tiburcio Pea M.D.   On: 06/05/2014 03:17   Nm Pulmonary Perf And Vent  06/15/2014   CLINICAL DATA:  80 year old female dialysis patient presenting with chest pain.  EXAM: NUCLEAR MEDICINE VENTILATION - PERFUSION LUNG SCAN  TECHNIQUE: Ventilation images were obtained in multiple projections using inhaled aerosol technetium 99 M DTPA. Perfusion images were obtained in multiple projections after intravenous injection of Tc-26m MAA.  RADIOPHARMACEUTICALS:  30.0 mCi Tc-11m DTPA aerosol and 5.0 mCi Tc-18m MAA  COMPARISON:  No priors.  FINDINGS: Ventilation: Markedly heterogeneous ventilation with large amount of deposition of radiotracer in the hilar regions bilaterally.  Perfusion: No wedge shaped peripheral perfusion defects to suggest acute pulmonary embolism.  IMPRESSION: 1. Very low probability (0-9%) for pulmonary embolism.   Electronically Signed   By: Trudie Reed M.D.   On: 06/15/2014 10:26   Dg Chest Portable 1 View  06/13/2014    CLINICAL DATA:  Acute shortness of breath.  Initial encounter.  EXAM: PORTABLE CHEST - 1 VIEW  COMPARISON:  06/04/2014 prior radiographs  FINDINGS: Cardiomegaly and mild left basilar scarring again noted.  There is no evidence of focal airspace disease, pulmonary edema, suspicious pulmonary nodule/mass, pleural effusion, or pneumothorax. No acute bony abnormalities are identified.  IMPRESSION: Cardiomegaly without evidence of active cardiopulmonary disease.   Electronically Signed   By: Laveda Abbe M.D.   On: 06/13/2014 01:06   Dg Chest Port 1 View  06/04/2014   CLINICAL DATA:  Asthma and hypertension.  Shortness of breath  EXAM: PORTABLE CHEST - 1 VIEW  COMPARISON:  05/10/2014  FINDINGS: Normal heart size. No pleural effusion or edema. Lung volumes are low. No airspace consolidation. Coarsened interstitial markings are identified bilaterally.  IMPRESSION: 1. Low lung volumes. 2. No acute findings noted.   Electronically Signed   By: Signa Kell M.D.   On: 06/04/2014 23:45    CBC  Recent Labs Lab 06/16/14 0101 06/17/14 0545 06/18/14 0134 06/19/14 0500 06/20/14 0514  WBC 10.2 10.4 12.0* 10.7* 8.9  HGB 7.6* 7.7* 7.5* 6.5* 10.2*  HCT 25.2* 24.7* 24.1* 21.0* 31.6*  PLT 201 165 170 166 163  MCV 95.1 97.2 95.6 93.3 95.5  MCH 28.7 30.3 29.8 28.9 30.8  MCHC 30.2 31.2 31.1 31.0 32.3  RDW 17.1* 17.4* 17.1* 17.7* 17.3*    Chemistries   Recent Labs Lab 06/15/14 0442 06/16/14 0101 06/17/14 0545 06/18/14 0134 06/19/14 0500  NA 141 141 138 138 137  K 4.3 4.3 4.2 3.5* 3.5*  CL 103 101 99 97 98  CO2 29 27 24 28 26   GLUCOSE 92 73 81 70 70  BUN 8 16 24* 9 17  CREATININE 2.31* 3.40* 4.64* 2.49* 4.18*  CALCIUM 8.1* 8.5 8.5 8.4 8.3*  MG  --   --   --   --  1.8  AST  --   --   --   --  12  ALT  --   --   --   --  7  ALKPHOS  --   --   --   --  61  BILITOT  --   --   --   --  0.4    ------------------------------------------------------------------------------------------------------------------ estimated creatinine clearance is 7.8 mL/min (by C-G formula based on Cr of 4.18). ------------------------------------------------------------------------------------------------------------------ No results for input(s): HGBA1C in the last 72 hours. ------------------------------------------------------------------------------------------------------------------ No  results for input(s): CHOL, HDL, LDLCALC, TRIG, CHOLHDL, LDLDIRECT in the last 72 hours. ------------------------------------------------------------------------------------------------------------------ No results for input(s): TSH, T4TOTAL, T3FREE, THYROIDAB in the last 72 hours.  Invalid input(s): FREET3 ------------------------------------------------------------------------------------------------------------------ No results for input(s): VITAMINB12, FOLATE, FERRITIN, TIBC, IRON, RETICCTPCT in the last 72 hours.  Coagulation profile  Recent Labs Lab 06/16/14 0101 06/17/14 0545 06/18/14 0134 06/19/14 0750 06/20/14 0514  INR 1.90* 1.72* 1.60* 1.54* 1.68*    No results for input(s): DDIMER in the last 72 hours.  Cardiac Enzymes No results for input(s): CKMB, TROPONINI, MYOGLOBIN in the last 168 hours.  Invalid input(s): CK ------------------------------------------------------------------------------------------------------------------ Invalid input(s): POCBNP    Pleas Koch, MD [facility] [phone]

## 2014-06-20 NOTE — Progress Notes (Signed)
Assessed current PIV outdated only. Site looks clean, patient has no complaints of current site, left arm restricted.Dressing changed,extension tubing changed,recommend leaving current PIV. Staff nurse aware. Eliot FordSarah Jazmine Longshore RN [facility].

## 2014-06-21 LAB — CBC
HCT: 29.7 % — ABNORMAL LOW (ref 36.0–46.0)
HEMOGLOBIN: 9.3 g/dL — AB (ref 12.0–15.0)
MCH: 29.8 pg (ref 26.0–34.0)
MCHC: 31.3 g/dL (ref 30.0–36.0)
MCV: 95.2 fL (ref 78.0–100.0)
Platelets: 172 10*3/uL (ref 150–400)
RBC: 3.12 MIL/uL — ABNORMAL LOW (ref 3.87–5.11)
RDW: 17.9 % — AB (ref 11.5–15.5)
WBC: 11.4 10*3/uL — ABNORMAL HIGH (ref 4.0–10.5)

## 2014-06-21 LAB — RENAL FUNCTION PANEL
ALBUMIN: 1.9 g/dL — AB (ref 3.5–5.2)
Anion gap: 7 (ref 5–15)
BUN: 7 mg/dL (ref 6–23)
CHLORIDE: 107 meq/L (ref 96–112)
CO2: 29 mEq/L (ref 19–32)
Calcium: 7.8 mg/dL — ABNORMAL LOW (ref 8.4–10.5)
Creatinine, Ser: 1.67 mg/dL — ABNORMAL HIGH (ref 0.50–1.10)
GFR, EST AFRICAN AMERICAN: 33 mL/min — AB (ref >90)
GFR, EST NON AFRICAN AMERICAN: 28 mL/min — AB (ref >90)
Glucose, Bld: 81 mg/dL (ref 70–99)
PHOSPHORUS: 1.1 mg/dL — AB (ref 2.3–4.6)
POTASSIUM: 3.6 meq/L — AB (ref 3.7–5.3)
SODIUM: 143 meq/L (ref 137–147)

## 2014-06-21 LAB — PROTIME-INR
INR: 2.34 — ABNORMAL HIGH (ref 0.00–1.49)
Prothrombin Time: 25.9 seconds — ABNORMAL HIGH (ref 11.6–15.2)

## 2014-06-21 MED ORDER — LIDOCAINE HCL (PF) 1 % IJ SOLN: 5.0000 mL | INTRAMUSCULAR | Status: DC | PRN

## 2014-06-21 MED ORDER — HEPARIN SODIUM (PORCINE) 1000 UNIT/ML DIALYSIS: 20.0000 [IU]/kg | INTRAMUSCULAR | Status: DC | PRN

## 2014-06-21 MED ORDER — SODIUM CHLORIDE 0.9 % IV SOLN: 100.0000 mL | INTRAVENOUS | Status: DC | PRN

## 2014-06-21 MED ORDER — HEPARIN SODIUM (PORCINE) 1000 UNIT/ML DIALYSIS: 100.0000 [IU]/kg | Freq: Once | INTRAMUSCULAR | Status: DC

## 2014-06-21 MED ORDER — NEPRO/CARBSTEADY PO LIQD: 237.0000 mL | ORAL | Status: DC | PRN

## 2014-06-21 MED ORDER — PENTAFLUOROPROP-TETRAFLUOROETH EX AERO: 1.0000 "application " | INHALATION_SPRAY | CUTANEOUS | Status: DC | PRN

## 2014-06-21 MED ORDER — MIDODRINE HCL 5 MG PO TABS
ORAL_TABLET | ORAL | Status: AC
  Filled 2014-06-21: qty 2

## 2014-06-21 MED ORDER — HEPARIN SODIUM (PORCINE) 1000 UNIT/ML DIALYSIS: 1000.0000 [IU] | INTRAMUSCULAR | Status: DC | PRN

## 2014-06-21 MED ORDER — ALTEPLASE 2 MG IJ SOLR: 2.0000 mg | Freq: Once | INTRAMUSCULAR | Status: DC | PRN

## 2014-06-21 MED ORDER — LIDOCAINE-PRILOCAINE 2.5-2.5 % EX CREA: 1.0000 "application " | TOPICAL_CREAM | CUTANEOUS | Status: DC | PRN

## 2014-06-21 MED ORDER — LIDOCAINE-PRILOCAINE 2.5-2.5 % EX CREA
1.0000 "application " | TOPICAL_CREAM | CUTANEOUS | Status: DC | PRN
  Filled 2014-06-21: qty 5

## 2014-06-21 MED ORDER — WARFARIN SODIUM 2.5 MG PO TABS
2.5000 mg | ORAL_TABLET | Freq: Once | ORAL | Status: AC
  Administered 2014-06-21: 2.5 mg via ORAL
  Filled 2014-06-21: qty 1

## 2014-06-21 MED ORDER — ALTEPLASE 2 MG IJ SOLR
2.0000 mg | Freq: Once | INTRAMUSCULAR | Status: DC | PRN
  Filled 2014-06-21: qty 2

## 2014-06-21 NOTE — Progress Notes (Signed)
ANTICOAGULATION CONSULT NOTE - Follow Up Consult  Pharmacy Consult for Coumadin Indication: hx DVT  Allergies  Allergen Reactions  . Nsaids     REACTION: Avoid NSAIDS(renal failure)    Patient Measurements: Weight: 117 lb 1 oz (53.1 kg)  Height: 5' IBW: 45.5 kg   Labs:  Recent Labs  06/19/14 0500 06/19/14 0750 06/19/14 1954 06/20/14 0514 06/21/14 0830 06/21/14 0950 06/21/14 1320  HGB 6.5*  --   --  10.2*  --  9.3*  --   HCT 21.0*  --   --  31.6*  --  29.7*  --   PLT 166  --   --  163  --  172  --   LABPROT  --  18.6*  --  19.9*  --   --  25.9*  INR  --  1.54*  --  1.68*  --   --  2.34*  HEPARINUNFRC  --  0.53 <0.10* <0.10*  --   --   --   CREATININE 4.18*  --   --   --  1.67*  --   --     Assessment: -81 YOF admitted to the ER with SOB from fluid overload on chronic Coumadin for hx of DVT in 05/2014 -Heparin was stopped on 12/11  -Resumed Coumadin on 12/9. INR 2.34 from 1.68 today and appears to be trending up quickly now. Home Coumadin dose is 5 mg daily but has received 3 doses of 7.5 mg. Eating 0-50% of meals. -Pt noted with with large hematoma on upper arm on 12/10 per renal notes. Looks more like generalized bruising today   Goal of Therapy:  INR 2-3 Monitor platelets by anticoagulation protocol: Yes     Plan:  Repeat Coumadin 2.5 mg tonight  Daily PT/INR and CBC   GenoaJennifer Bertie, 17[address] Rainbow BoulevardPharm.D., BCPS Clinical Pharmacist Pager: [phone] 06/21/2014 2:00 PM

## 2014-06-21 NOTE — Progress Notes (Signed)
[facility]                                                                              Patient Demographics  [name], is Page 80 y.o. female, DOB - [birth year], [id number]  Admit date - 06/12/2014   Admitting Physician [name] PetrinMaryann Mikhail, DO  Outpatient Primary MD for the patient is [name],[name] A, MD  LOS - 9   Chief Complaint  Patient presents with  . Shortness of Breath      HPI on 06/13/2014 by Dr. Midge MiniumArshad Kakrakandy [name] is Page 80 y.o. female with Page history of ESRD on hemodialysis on Tuesday Thursday and Saturday was brought to the ER after patient was complaining of shortness of breath. As per the family patient did not have dialysis yesterday because patient had multiple episodes of diarrhea. Patient was recently admitted to the hospital and was diagnosed with right lower extremity DVT and was placed on Coumadin. Patient suppoed to have Page shuntogram done 06/13/14 by Dr. Darrick PennaFields  On exam patient initially was tachypneic and tachycardic and chest x-ray was not showing any definite congestion-? there may be Page complement of possible mild fluid overload patient has been admitted for observation and get dialysis in the morning.    Assessment & Plan  Acute encephalopathy -Multifactorial causes: dyspnea vs UTI-potentially medication effect as started trazadone and Ativan given which we will hold  -?underlying dementia -Vitamin B12: 359 (low normal), was given B12 injection 06/14/2014 -TSH 0.821 -Spoke with daughter, this may be underlying dementia and patient has not been able to tolerate dialsysis -Palliative care consulted, input appreciated from Ms Jimmey RalphParker- Vicodin probably doesn't metabolize well with ESRD, so have changed patient to Tramadol  Dyspnea -Resolved, Likely multifactorial including missed dialysis with volume overload versus anxiety -CXR: Cardiomegaly without evidence of active cardiopulmonary disease -VQ scan: very low probability for  PE  Diarrhea -Per patient, has resolved (patient had one episode of diarrhea in the emergency department) -Possibly secondary to laxative -C. difficile PCR 12/8 neg  Pyelonephritis unclear source -UA: WBC TNTC, many bacteria, large leukocytes -Rocephin d/c 06/18/14 as has received 4 days IV therpay -urine culture not received at admission  ESRD on hemodialysis -Patient dialyzes on Tuesday, Thursday, Saturday -Nephrology consulted and appreciated -Patient was scheduled for shuntogram with Dr. Darrick Pennafields 12/4, however canceled -Spoke with Dr. Darrick PennaFields, patient  Reevaluated and working shunt -Spoke with daughter regarding this, she agreed to have palliative care consult.  Asthma -Currently stable, patient is not coughing or wheezing at this time  Chronic anemia with acute component of ecchymosis 12/10 -Likely secondary to ESRD -2 U PRBC trasnfused -Hemoglobin stable at 10.2 now -Continue aranesp and IV Iron  Lower extremity DVT 05/2014 with supratherapeutic INR -Coumadin held, INR 1.6 today -heparin d/c 12/9  Hypotension -Continue Midodrine -still hypotensive with dialysis   Severe malnutrition -continue feeding supplements -Nutrition consulted  Goals of care -Palliative care consulted and appreciated.  Code Status: Full  Family Communication: None at bedside, have spoken with daughter via phone 12/10 Main issue now is issues with safety to heself and impulsiveness.  PAtient is medically stable but Needs SNF level care  Disposition Plan: inpatient  Time  Spent in minutes   10 minutes  Procedures  None  Consults   Nephrology Vascular Surgery, Dr. Darrick PennaFields Palliative care  DVT Prophylaxis  Heparin   Lab Results  Component Value Date   PLT 172 06/21/2014    Medications  Scheduled Meds: . albuterol  3 mL Inhalation QHS  . cinacalcet  30 mg Oral Q breakfast  . darbepoetin (ARANESP) injection - DIALYSIS  200 mcg Intravenous Q Thu-HD  . feeding supplement (NEPRO  CARB STEADY)  237 mL Oral BID BM  . ferric gluconate (FERRLECIT/NULECIT) IV  125 mg Intravenous Q T,Th,Sa-HD  . midodrine      . midodrine  10 mg Oral TID WC  . multivitamin  1 tablet Oral Daily  . sevelamer carbonate  800 mg Oral TID WC  . sodium chloride  3 mL Intravenous Q12H  . warfarin  2.5 mg Oral ONCE-1800  . Warfarin - Pharmacist Dosing Inpatient   Does not apply q1800   Continuous Infusions:   PRN Meds:.acetaminophen **OR** acetaminophen, albuterol, LORazepam, nitroGLYCERIN, ondansetron **OR** ondansetron (ZOFRAN) IV, traMADol  Antibiotics   Anti-infectives    Start     Dose/Rate Route Frequency Ordered Stop   06/13/14 0500  cefTRIAXone (ROCEPHIN) 1 g in dextrose 5 % 50 mL IVPB - Premix  Status:  Discontinued     1 g100 mL/hr over 30 Minutes Intravenous Every 24 hours 06/13/14 0437 06/21/14 1327        Subjective:   SLeepy.  Just back from dialysis Some confusion per RN Didn't eat much for dinner NO family at the bedside Seemed to tolerate the whole session of dialysis  Objective:   Filed Vitals:   06/21/14 1218 06/21/14 1230 06/21/14 1300 06/21/14 1400  BP: 81/49 101/52 101/63 89/53  Pulse: 63 65 84 80  Temp:    97.5 F (36.4 C)  TempSrc:    Oral  Resp:    17  Weight:    53.7 kg (118 lb 6.2 oz)  SpO2:    99%    Wt Readings from Last 3 Encounters:  06/21/14 53.7 kg (118 lb 6.2 oz)  06/09/14 55.6 kg (122 lb 9.2 oz)  04/10/14 56.7 kg (125 lb)     Intake/Output Summary (Last 24 hours) at 06/21/14 1434 Last data filed at 06/21/14 1400  Gross per 24 hour  Intake      0 ml  Output    139 ml  Net   -139 ml    Exam  General: Well developed, well nourished, NAD, appears stated age  HEENT: NCAT, mucous membranes moist.   Cardiovascular: S1 S2 auscultated, 2/6 SEM, RRR  Respiratory: Clear to auscultation bilaterally with equal chest rise  Area of ecchymosis/bleeding on L forearms look better  Data Review   Micro Results Recent Results (from  the past 240 hour(s))  Clostridium Difficile by PCR     Status: None   Collection Time: 06/17/14  6:00 PM  Result Value Ref Range Status   C difficile by pcr NEGATIVE NEGATIVE Final    Radiology Reports Ct Head Wo Contrast  06/05/2014   CLINICAL DATA:  Intermittent confusion associated with hypertension.  EXAM: CT HEAD WITHOUT CONTRAST  TECHNIQUE: Contiguous axial images were obtained from the base of the skull through the vertex without intravenous contrast.  COMPARISON:  None.  FINDINGS: Skull and Sinuses:Negative for fracture or destructive process. The mastoids, middle ears, and imaged paranasal sinuses are clear.  Orbits: No acute abnormality.  Brain: No evidence of  acute abnormality, such as acute infarction, hemorrhage, hydrocephalus, or mass lesion/mass effect. There is generalized volume loss consistent with atrophy. Mild chronic small vessel disease with ischemic gliosis noted around the frontal horn of the left lateral ventricle.  IMPRESSION: 1. No acute intracranial disease. 2. Brain atrophy and mild chronic small vessel ischemia.   Electronically Signed   By: Tiburcio Pea M.D.   On: 06/05/2014 03:17   Nm Pulmonary Perf And Vent  06/15/2014   CLINICAL DATA:  80 year old female dialysis patient presenting with chest pain.  EXAM: NUCLEAR MEDICINE VENTILATION - PERFUSION LUNG SCAN  TECHNIQUE: Ventilation images were obtained in multiple projections using inhaled aerosol technetium 99 M DTPA. Perfusion images were obtained in multiple projections after intravenous injection of Tc-82m MAA.  RADIOPHARMACEUTICALS:  30.0 mCi Tc-64m DTPA aerosol and 5.0 mCi Tc-67m MAA  COMPARISON:  No priors.  FINDINGS: Ventilation: Markedly heterogeneous ventilation with large amount of deposition of radiotracer in the hilar regions bilaterally.  Perfusion: No wedge shaped peripheral perfusion defects to suggest acute pulmonary embolism.  IMPRESSION: 1. Very low probability (0-9%) for pulmonary embolism.    Electronically Signed   By: Trudie Reed M.D.   On: 06/15/2014 10:26   Dg Chest Portable 1 View  06/13/2014   CLINICAL DATA:  Acute shortness of breath.  Initial encounter.  EXAM: PORTABLE CHEST - 1 VIEW  COMPARISON:  06/04/2014 prior radiographs  FINDINGS: Cardiomegaly and mild left basilar scarring again noted.  There is no evidence of focal airspace disease, pulmonary edema, suspicious pulmonary nodule/mass, pleural effusion, or pneumothorax. No acute bony abnormalities are identified.  IMPRESSION: Cardiomegaly without evidence of active cardiopulmonary disease.   Electronically Signed   By: Laveda Abbe M.D.   On: 06/13/2014 01:06   Dg Chest Port 1 View  06/04/2014   CLINICAL DATA:  Asthma and hypertension.  Shortness of breath  EXAM: PORTABLE CHEST - 1 VIEW  COMPARISON:  05/10/2014  FINDINGS: Normal heart size. No pleural effusion or edema. Lung volumes are low. No airspace consolidation. Coarsened interstitial markings are identified bilaterally.  IMPRESSION: 1. Low lung volumes. 2. No acute findings noted.   Electronically Signed   By: Signa Kell M.D.   On: 06/04/2014 23:45    CBC  Recent Labs Lab 06/17/14 0545 06/18/14 0134 06/19/14 0500 06/20/14 0514 06/21/14 0950  WBC 10.4 12.0* 10.7* 8.9 11.4*  HGB 7.7* 7.5* 6.5* 10.2* 9.3*  HCT 24.7* 24.1* 21.0* 31.6* 29.7*  PLT 165 170 166 163 172  MCV 97.2 95.6 93.3 95.5 95.2  MCH 30.3 29.8 28.9 30.8 29.8  MCHC 31.2 31.1 31.0 32.3 31.3  RDW 17.4* 17.1* 17.7* 17.3* 17.9*    Chemistries   Recent Labs Lab 06/16/14 0101 06/17/14 0545 06/18/14 0134 06/19/14 0500 06/21/14 0830  NA 141 138 138 137 143  K 4.3 4.2 3.5* 3.5* 3.6*  CL 101 99 97 98 107  CO2 27 24 28 26 29   GLUCOSE 73 81 70 70 81  BUN 16 24* 9 17 7   CREATININE 3.40* 4.64* 2.49* 4.18* 1.67*  CALCIUM 8.5 8.5 8.4 8.3* 7.8*  MG  --   --   --  1.8  --   AST  --   --   --  12  --   ALT  --   --   --  7  --   ALKPHOS  --   --   --  61  --   BILITOT  --   --   --  0.4   --    ------------------------------------------------------------------------------------------------------------------ estimated creatinine clearance is 19.6 mL/min (by C-G formula based on Cr of 1.67). ------------------------------------------------------------------------------------------------------------------ No results for input(s): HGBA1C in the last 72 hours. ------------------------------------------------------------------------------------------------------------------ No results for input(s): CHOL, HDL, LDLCALC, TRIG, CHOLHDL, LDLDIRECT in the last 72 hours. ------------------------------------------------------------------------------------------------------------------ No results for input(s): TSH, T4TOTAL, T3FREE, THYROIDAB in the last 72 hours.  Invalid input(s): FREET3 ------------------------------------------------------------------------------------------------------------------ No results for input(s): VITAMINB12, FOLATE, FERRITIN, TIBC, IRON, RETICCTPCT in the last 72 hours.  Coagulation profile  Recent Labs Lab 06/17/14 0545 06/18/14 0134 06/19/14 0750 06/20/14 0514 06/21/14 1320  INR 1.72* 1.60* 1.54* 1.68* 2.34*    No results for input(s): DDIMER in the last 72 hours.  Cardiac Enzymes No results for input(s): CKMB, TROPONINI, MYOGLOBIN in the last 168 hours.  Invalid input(s): CK ------------------------------------------------------------------------------------------------------------------ Invalid input(s): POCBNP    Pleas Koch, MD [facility] [phone]

## 2014-06-21 NOTE — Procedures (Signed)
Clinically improved. Hemodynamically stable.  [name] C

## 2014-06-22 LAB — CBC
HCT: 34.2 % — ABNORMAL LOW (ref 36.0–46.0)
Hemoglobin: 10.4 g/dL — ABNORMAL LOW (ref 12.0–15.0)
MCH: 29.6 pg (ref 26.0–34.0)
MCHC: 30.4 g/dL (ref 30.0–36.0)
MCV: 97.4 fL (ref 78.0–100.0)
Platelets: 189 10*3/uL (ref 150–400)
RBC: 3.51 MIL/uL — AB (ref 3.87–5.11)
RDW: 18.1 % — AB (ref 11.5–15.5)
WBC: 10.3 10*3/uL (ref 4.0–10.5)

## 2014-06-22 LAB — PROTIME-INR
INR: 2.7 — ABNORMAL HIGH (ref 0.00–1.49)
PROTHROMBIN TIME: 28.9 s — AB (ref 11.6–15.2)

## 2014-06-22 MED ORDER — WARFARIN SODIUM 4 MG PO TABS
4.0000 mg | ORAL_TABLET | Freq: Once | ORAL | Status: AC
  Administered 2014-06-22: 4 mg via ORAL
  Filled 2014-06-22: qty 1

## 2014-06-22 MED ORDER — LORAZEPAM 0.5 MG PO TABS
0.5000 mg | ORAL_TABLET | Freq: Once | ORAL | Status: AC
  Administered 2014-06-22: 0.5 mg via ORAL

## 2014-06-22 NOTE — Progress Notes (Signed)
ANTICOAGULATION CONSULT NOTE - Follow Up Consult  Pharmacy Consult:  Coumadin Indication: hx DVT  Allergies  Allergen Reactions  . Nsaids     REACTION: Avoid NSAIDS(renal failure)    Patient Measurements: Height: 5' (152.4 cm) Weight: 117 lb 11.6 oz (53.4 kg) IBW/kg (Calculated) : 45.5  Height: 5' IBW: 45.5 kg   Labs:  Recent Labs  06/19/14 1954  06/20/14 0514 06/21/14 0830 06/21/14 0950 06/21/14 1320 06/22/14 0445  HGB  --   < > 10.2*  --  9.3*  --  10.4*  HCT  --   --  31.6*  --  29.7*  --  34.2*  PLT  --   --  163  --  172  --  189  LABPROT  --   --  19.9*  --   --  25.9* 28.9*  INR  --   --  1.68*  --   --  2.34* 2.70*  HEPARINUNFRC <0.10*  --  <0.10*  --   --   --   --   CREATININE  --   --   --  1.67*  --   --   --   < > = values in this interval not displayed.     Assessment: [age] YOF to continue on Coumadin for history of DVT in November.  INR is therapeutic today.  Noted documentation of large hematoma on upper arm on 06/19/14 that now looks like generalized bruising.   Goal of Therapy:  INR 2-3      Plan:  - Coumadin 4mg  PO today - Daily PT / INR    Shaneequa Bahner D. Laney Potashang, PharmD, BCPS Pager:  [phone] 06/22/2014, 2:26 PM

## 2014-06-22 NOTE — Progress Notes (Signed)
Subjective:   No complaints, family at bedside  Objective Filed Vitals:   06/21/14 1400 06/21/14 1808 06/21/14 2139 06/22/14 0522  BP: 89/53 99/61 114/61 108/55  Pulse: 80 82 76 73  Temp: 97.5 F (36.4 C) 98.2 F (36.8 C) 99 F (37.2 C) 98.5 F (36.9 C)  TempSrc: Oral Oral Oral Oral  Resp: 17 18 18 17   Height: 5' (1.524 m)     Weight: 53.7 kg (118 lb 6.2 oz)  53.4 kg (117 lb 11.6 oz)   SpO2: 99% 99% 100% 100%   Physical Exam General: awake and alert. No acute distress.  Heart: RRR  Lungs: cTA, unlabored.   Abdomen: soft, nontender +BS Extremities: no edema  Dialysis Access: L AVF +b/t    Dialysis: [facility] TTS  4 hr 56 kg std heparin 4 K 2.25 Ca 400/800 left AVF  Calcitriol 0.5 on hold , Aranesp 40 q Sat, venofer 100 through 12/10  Assessment/Plan: 1. SOB - resolved. 2. Confusion/AMS - possibly underlying dementia and/or psychosis, also with E coli UTI, completed Rocephin. 3. AVF infiltration with arm hematoma - successfully used for HD, fistulogram cancelled. 4. ESRD - HD on TTS @ [facility], K 3.6. HD next on tuesday 5. HTN/Volume - BP 116/69 on Midodrine 10 mg tid; wt 53. 3 kg, below EDW. 6. Anemia - Hgb 10.4, Aranesp increased to 200 mcg on Thurs, Fe per HD through 12/10. 7. Sec HPT - Ca 7.8/9.5 corrected. Calcitriol on hold, Sensipar 30 mg qd, Renvela 1 with meals. 8. Nutrition - renal diet, vitamin. Alb 1.9 reports eating well. nepro 9. New DVT - 05/2014, restarted Coumadin 12/9 after cancelled-  fistulogram, INR 2.7 10. DNR- palliative care following  Jetty DuhamelBridget Whelan, NP [facility] Beeper [phone] 06/22/2014,10:27 AM  LOS: 10 days    Renal Attending: She has dementia.  She tolerated HD yesterday. Plan is for SNF. Agree with above. Liem Copenhaver C    Additional Objective Labs: Basic Metabolic Panel:  Recent Labs Lab 06/18/14 0134 06/19/14 0500 06/21/14 0830  NA 138 137 143  K 3.5* 3.5* 3.6*  CL 97 98 107  CO2 28 26 29   GLUCOSE 70  70 81  BUN 9 17 7   CREATININE 2.49* 4.18* 1.67*  CALCIUM 8.4 8.3* 7.8*  PHOS  --  3.4 1.1*   Liver Function Tests:  Recent Labs Lab 06/19/14 0500 06/21/14 0830  AST 12  --   ALT 7  --   ALKPHOS 61  --   BILITOT 0.4  --   PROT 5.4*  --   ALBUMIN 2.1* 1.9*   No results for input(s): LIPASE, AMYLASE in the last 168 hours. CBC:  Recent Labs Lab 06/18/14 0134 06/19/14 0500 06/20/14 0514 06/21/14 0950 06/22/14 0445  WBC 12.0* 10.7* 8.9 11.4* 10.3  HGB 7.5* 6.5* 10.2* 9.3* 10.4*  HCT 24.1* 21.0* 31.6* 29.7* 34.2*  MCV 95.6 93.3 95.5 95.2 97.4  PLT 170 166 163 172 189   Blood Culture    Component Value Date/Time   SDES URINE, CATHETERIZED 06/05/2014 0014   SPECREQUEST NONE 06/05/2014 0014   CULT  06/05/2014 0014    ESCHERICHIA COLI Performed at [facility]    REPTSTATUS 06/07/2014 FINAL 06/05/2014 0014    Cardiac Enzymes: No results for input(s): CKTOTAL, CKMB, CKMBINDEX, TROPONINI in the last 168 hours. CBG: No results for input(s): GLUCAP in the last 168 hours. Iron Studies: No results for input(s): IRON, TIBC, TRANSFERRIN, FERRITIN in the last 72 hours. @lablastinr3 @ Studies/Results: No results  found. Medications:   . albuterol  3 mL Inhalation QHS  . cinacalcet  30 mg Oral Q breakfast  . darbepoetin (ARANESP) injection - DIALYSIS  200 mcg Intravenous Q Thu-HD  . feeding supplement (NEPRO CARB STEADY)  237 mL Oral BID BM  . midodrine  10 mg Oral TID WC  . multivitamin  1 tablet Oral Daily  . sevelamer carbonate  800 mg Oral TID WC  . sodium chloride  3 mL Intravenous Q12H  . Warfarin - Pharmacist Dosing Inpatient   Does not apply [phone]

## 2014-06-22 NOTE — Progress Notes (Signed)
[facility]                                                                              Patient Demographics  [name], is a 80 y.o. female, DOB - 07/14/1932, ZOX:[id number]  Admit date - 06/12/2014   Admitting Physician [name], DO  Outpatient Primary MD for the patient is Dorrene GermanAVBUERE,[name], MD  LOS - 10   Chief Complaint  Patient presents with  . Shortness of Breath      HPI on 06/13/2014 by Dr. Midge MiniumArshad Kakrakandy [name] is a 80 y.o. female with a history of ESRD on hemodialysis on Tuesday Thursday and Saturday was brought to the ER after patient was complaining of shortness of breath. As per the family patient did not have dialysis yesterday because patient had multiple episodes of diarrhea. Patient was recently admitted to the hospital and was diagnosed with right lower extremity DVT and was placed on Coumadin. Patient suppoed to have a shuntogram done 06/13/14 by Dr. Darrick PennaFields  On exam patient initially was tachypneic and tachycardic and chest x-ray was not showing any definite congestion-? there may be a complement of possible mild fluid overload patient has been admitted for observation and get dialysis in the morning.    Assessment & Plan  Acute encephalopathy -Multifactorial causes: dyspnea vs UTI-potentially medication effect as started trazadone and Ativan given which we will hold  -underlying dementia -Vitamin B12: 359 (low normal), was given B12 injection 06/14/2014 -TSH 0.821 -Spoke with daughter, this may be underlying dementia and patient has not been able to tolerate dialsysis -Palliative care consulted, input appreciated from Ms Jimmey RalphParker- Vicodin probably doesn't metabolize well with ESRD, so have changed patient to Tramadol -OP re-evaluation of her state during dialysis-daughters plan to be present at dialysis  Dyspnea -Resolved, Likely multifactorial including missed dialysis with volume overload versus anxiety -CXR: Cardiomegaly without  evidence of active cardiopulmonary disease -VQ scan: very low probability for PE  Diarrhea -Per patient, has resolved (patient had one episode of diarrhea in the emergency department) -Possibly secondary to laxative -C. difficile PCR 12/8 neg  Pyelonephritis unclear source -UA: WBC TNTC, many bacteria, large leukocytes -Rocephin d/c 06/18/14 as has received 4 days IV therpay -urine culture not received at admission  ESRD on hemodialysis -Patient dialyzes on Tuesday, Thursday, Saturday -Nephrology consulted and appreciated -Patient was scheduled for shuntogram with Dr. Darrick Pennafields 12/4, however canceled -Spoke with Dr. Darrick PennaFields, patient  Reevaluated and working shunt  Asthma -Currently stable, patient is not coughing or wheezing at this time  Chronic anemia with acute component of ecchymosis 12/10 -Likely secondary to ESRD -2 U PRBC trasnfused -Hemoglobin stable at 10.2 now -Continue aranesp and IV Iron -Echymiosis improved  Lower extremity DVT 05/2014 with supratherapeutic INR -[facility] 2.7 today -heparin d/c 12/9  Hypotension -Continue Midodrine -still hypotensive with dialysis   Severe malnutrition -continue feeding supplements -Nutrition consulted  Goals of care -Palliative care consulted and appreciated.  Code Status: Full Family Communication: discussed in detailw ith daughter Disposition Plan: inpatient-likely SNF in am 12/14  Time Spent in minutes   25 minutes  Procedures  None  Consults   Nephrology Vascular Surgery, Dr. Darrick PennaFields  Palliative care  DVT Prophylaxis  Heparin   Lab Results  Component Value Date   PLT 189 06/22/2014    Medications  Scheduled Meds: . albuterol  3 mL Inhalation QHS  . cinacalcet  30 mg Oral Q breakfast  . darbepoetin (ARANESP) injection - DIALYSIS  200 mcg Intravenous Q Thu-HD  . feeding supplement (NEPRO CARB STEADY)  237 mL Oral BID BM  . midodrine  10 mg Oral TID WC  . multivitamin  1 tablet Oral Daily    . sevelamer carbonate  800 mg Oral TID WC  . sodium chloride  3 mL Intravenous Q12H  . warfarin  4 mg Oral ONCE-1800  . Warfarin - Pharmacist Dosing Inpatient   Does not apply q1800   Continuous Infusions:   PRN Meds:.acetaminophen **OR** acetaminophen, albuterol, LORazepam, nitroGLYCERIN, ondansetron **OR** ondansetron (ZOFRAN) IV, traMADol  Antibiotics   Anti-infectives    Start     Dose/Rate Route Frequency Ordered Stop   06/13/14 0500  cefTRIAXone (ROCEPHIN) 1 g in dextrose 5 % 50 mL IVPB - Premix  Status:  Discontinued     1 g100 mL/hr over 30 Minutes Intravenous Every 24 hours 06/13/14 0437 06/21/14 1327        Subjective:   sleping most of the day Confused and awake nights Daughter present Patient ate some lunch NO issues otherwise  Objective:   Filed Vitals:   06/21/14 1808 06/21/14 2139 06/22/14 0522 06/22/14 1030  BP: 99/61 114/61 108/55 116/69  Pulse: 82 76 73 79  Temp: 98.2 F (36.8 C) 99 F (37.2 C) 98.5 F (36.9 C) 98.5 F (36.9 C)  TempSrc: Oral Oral Oral Oral  Resp: 18 18 17 18   Height:      Weight:  53.4 kg (117 lb 11.6 oz)    SpO2: 99% 100% 100% 100%    Wt Readings from Last 3 Encounters:  06/21/14 53.4 kg (117 lb 11.6 oz)  06/09/14 55.6 kg (122 lb 9.2 oz)  04/10/14 56.7 kg (125 lb)     Intake/Output Summary (Last 24 hours) at 06/22/14 1546 Last data filed at 06/22/14 1355  Gross per 24 hour  Intake    720 ml  Output      2 ml  Net    718 ml    Exam  General: Well developed, well nourished, NAD, appears stated age  HEENT: NCAT, mucous membranes moist.   Cardiovascular: S1 S2 auscultated, 2/6 SEM, RRR  Respiratory: Clear to auscultation bilaterally with equal chest rise  Area of ecchymosis/bleeding on L forearm greatly improved  Data Review   Micro Results Recent Results (from the past 240 hour(s))  Clostridium Difficile by PCR     Status: None   Collection Time: 06/17/14  6:00 PM  Result Value Ref Range Status   C  difficile by pcr NEGATIVE NEGATIVE Final    Radiology Reports Ct Head Wo Contrast  06/05/2014   CLINICAL DATA:  Intermittent confusion associated with hypertension.  EXAM: CT HEAD WITHOUT CONTRAST  TECHNIQUE: Contiguous axial images were obtained from the base of the skull through the vertex without intravenous contrast.  COMPARISON:  None.  FINDINGS: Skull and Sinuses:Negative for fracture or destructive process. The mastoids, middle ears, and imaged paranasal sinuses are clear.  Orbits: No acute abnormality.  Brain: No evidence of acute abnormality, such as acute infarction, hemorrhage, hydrocephalus, or mass lesion/mass effect. There is generalized volume loss consistent with atrophy. Mild chronic small vessel disease with ischemic gliosis noted around the frontal  horn of the left lateral ventricle.  IMPRESSION: 1. No acute intracranial disease. 2. Brain atrophy and mild chronic small vessel ischemia.   Electronically Signed   By: Tiburcio Pea M.D.   On: 06/05/2014 03:17   Nm Pulmonary Perf And Vent  06/15/2014   CLINICAL DATA:  80 year old female dialysis patient presenting with chest pain.  EXAM: NUCLEAR MEDICINE VENTILATION - PERFUSION LUNG SCAN  TECHNIQUE: Ventilation images were obtained in multiple projections using inhaled aerosol technetium 99 M DTPA. Perfusion images were obtained in multiple projections after intravenous injection of Tc-77m MAA.  RADIOPHARMACEUTICALS:  30.0 mCi Tc-70m DTPA aerosol and 5.0 mCi Tc-53m MAA  COMPARISON:  No priors.  FINDINGS: Ventilation: Markedly heterogeneous ventilation with large amount of deposition of radiotracer in the hilar regions bilaterally.  Perfusion: No wedge shaped peripheral perfusion defects to suggest acute pulmonary embolism.  IMPRESSION: 1. Very low probability (0-9%) for pulmonary embolism.   Electronically Signed   By: Trudie Reed M.D.   On: 06/15/2014 10:26   Dg Chest Portable 1 View  06/13/2014   CLINICAL DATA:  Acute shortness of  breath.  Initial encounter.  EXAM: PORTABLE CHEST - 1 VIEW  COMPARISON:  06/04/2014 prior radiographs  FINDINGS: Cardiomegaly and mild left basilar scarring again noted.  There is no evidence of focal airspace disease, pulmonary edema, suspicious pulmonary nodule/mass, pleural effusion, or pneumothorax. No acute bony abnormalities are identified.  IMPRESSION: Cardiomegaly without evidence of active cardiopulmonary disease.   Electronically Signed   By: Laveda Abbe M.D.   On: 06/13/2014 01:06   Dg Chest Port 1 View  06/04/2014   CLINICAL DATA:  Asthma and hypertension.  Shortness of breath  EXAM: PORTABLE CHEST - 1 VIEW  COMPARISON:  05/10/2014  FINDINGS: Normal heart size. No pleural effusion or edema. Lung volumes are low. No airspace consolidation. Coarsened interstitial markings are identified bilaterally.  IMPRESSION: 1. Low lung volumes. 2. No acute findings noted.   Electronically Signed   By: Signa Kell M.D.   On: 06/04/2014 23:45    CBC  Recent Labs Lab 06/18/14 0134 06/19/14 0500 06/20/14 0514 06/21/14 0950 06/22/14 0445  WBC 12.0* 10.7* 8.9 11.4* 10.3  HGB 7.5* 6.5* 10.2* 9.3* 10.4*  HCT 24.1* 21.0* 31.6* 29.7* 34.2*  PLT 170 166 163 172 189  MCV 95.6 93.3 95.5 95.2 97.4  MCH 29.8 28.9 30.8 29.8 29.6  MCHC 31.1 31.0 32.3 31.3 30.4  RDW 17.1* 17.7* 17.3* 17.9* 18.1*    Chemistries   Recent Labs Lab 06/16/14 0101 06/17/14 0545 06/18/14 0134 06/19/14 0500 06/21/14 0830  NA 141 138 138 137 143  K 4.3 4.2 3.5* 3.5* 3.6*  CL 101 99 97 98 107  CO2 27 24 28 26 29   GLUCOSE 73 81 70 70 81  BUN 16 24* 9 17 7   CREATININE 3.40* 4.64* 2.49* 4.18* 1.67*  CALCIUM 8.5 8.5 8.4 8.3* 7.8*  MG  --   --   --  1.8  --   AST  --   --   --  12  --   ALT  --   --   --  7  --   ALKPHOS  --   --   --  61  --   BILITOT  --   --   --  0.4  --    ------------------------------------------------------------------------------------------------------------------ estimated creatinine  clearance is 19.6 mL/min (by C-G formula based on Cr of 1.67). ------------------------------------------------------------------------------------------------------------------ No results for input(s): HGBA1C in the last  72 hours. ------------------------------------------------------------------------------------------------------------------ No results for input(s): CHOL, HDL, LDLCALC, TRIG, CHOLHDL, LDLDIRECT in the last 72 hours. ------------------------------------------------------------------------------------------------------------------ No results for input(s): TSH, T4TOTAL, T3FREE, THYROIDAB in the last 72 hours.  Invalid input(s): FREET3 ------------------------------------------------------------------------------------------------------------------ No results for input(s): VITAMINB12, FOLATE, FERRITIN, TIBC, IRON, RETICCTPCT in the last 72 hours.  Coagulation profile  Recent Labs Lab 06/18/14 0134 06/19/14 0750 06/20/14 0514 06/21/14 1320 06/22/14 0445  INR 1.60* 1.54* 1.68* 2.34* 2.70*    No results for input(s): DDIMER in the last 72 hours.  Cardiac Enzymes No results for input(s): CKMB, TROPONINI, MYOGLOBIN in the last 168 hours.  Invalid input(s): CK ------------------------------------------------------------------------------------------------------------------ Invalid input(s): POCBNP    Pleas Koch, MD [facility] [phone]

## 2014-06-23 LAB — CBC
HCT: 34.9 % — ABNORMAL LOW (ref 36.0–46.0)
Hemoglobin: 10.4 g/dL — ABNORMAL LOW (ref 12.0–15.0)
MCH: 29.4 pg (ref 26.0–34.0)
MCHC: 29.8 g/dL — AB (ref 30.0–36.0)
MCV: 98.6 fL (ref 78.0–100.0)
PLATELETS: 204 10*3/uL (ref 150–400)
RBC: 3.54 MIL/uL — AB (ref 3.87–5.11)
RDW: 18.5 % — AB (ref 11.5–15.5)
WBC: 10.2 10*3/uL (ref 4.0–10.5)

## 2014-06-23 LAB — PROTIME-INR
INR: 2.53 — ABNORMAL HIGH (ref 0.00–1.49)
PROTHROMBIN TIME: 27.4 s — AB (ref 11.6–15.2)

## 2014-06-23 MED ORDER — LORAZEPAM 0.5 MG PO TABS: 0.5000 mg | ORAL_TABLET | Freq: Two times a day (BID) | ORAL | Status: DC | PRN

## 2014-06-23 MED ORDER — TRAMADOL HCL 50 MG PO TABS: 50.0000 mg | ORAL_TABLET | Freq: Four times a day (QID) | ORAL | Status: DC | PRN

## 2014-06-23 MED ORDER — WARFARIN SODIUM 4 MG PO TABS
4.0000 mg | ORAL_TABLET | Freq: Once | ORAL | Status: DC
  Filled 2014-06-23: qty 1

## 2014-06-23 NOTE — Progress Notes (Signed)
Patient Discharge:  Dis[facility]: [facility]  ZO:[id number]:Removed   Transportation: Via ambulance  Belongings: All belongings taken with pt

## 2014-06-23 NOTE — Progress Notes (Signed)
Subjective:  [facility]wait[facility], son present, ready to leave; otherwise, no complaints  Objective: Vital signs in last 24 hours: Temp:  [97.7 F (36.5 C)-98.5 F (36.9 C)] 97.9 F (36.6 C) (12/14 0420) Pulse Rate:  [71-119] 71 (12/14 0420) Resp:  [17-18] 17 (12/14 0420) BP: (101-116)/(57-69) 105/57 mmHg (12/14 0420) SpO2:  [97 %-100 %] 100 % (12/14 0420) Weight:  [53.434 kg (117 lb 12.8 oz)] 53.434 kg (117 lb 12.8 oz) (12/13 2010) Weight change: -0.266 kg (-9.4 oz)  Intake/Output from previous day: 12/13 0701 - 12/14 0700 In: 360 [P.O.:360] Out: 2 [Urine:1; Stool:1] Intake/Output this shift:   Lab Results:  Recent Labs  06/22/14 0445 06/23/14 0440  WBC 10.3 10.2  HGB 10.4* 10.4*  HCT 34.2* 34.9*  PLT 189 204   BMET:  Recent Labs  06/21/14 0830  NA 143  K 3.6*  CL 107  CO2 29  GLUCOSE 81  BUN 7  CREATININE 1.67*  CALCIUM 7.8*  ALBUMIN 1.9*   No results for input(s): PTH in the last 72 hours. Iron Studies: No results for input(s): IRON, TIBC, TRANSFERRIN, FERRITIN in the last 72 hours.  Studies/Results: No results found.   EXAM: General appearance: Alert, in no apparent distress  Resp: CTA without rales, rhonchi, or wheezes Cardio: RRR without murmur or rub GI: + BS, soft and nontender Extremities: L arm swelling distal to AVF better, no LE edema Access: AVF @ LUA with + bruit, L arm swelling better  Dialysis: [facility] TTS  4 hr 56 kg std heparin 4 K 2.25 Ca 400/800 left AVF  Calcitriol 0.5 on hold , Aranesp 40 q Sat, venofer 100 through 12/10  Assessment/Plan: 1. SOB - resolved 2. Confusion/AMS - underlying dementia and/or psychosis most likely chronic 3. AVF infiltration with arm hematoma - still very large hematoma, still able to use for HD  4. ESRD - HD on TTS @ [facility]. HD pending. 5. HTN/Volume - BP 105/57, on Midodrine 10 mg tid; wt 53.4 kg, below EDW. 6. Anemia - Hgb 10.4, Aranesp 200 mcg on Thurs, completed Fe loading  12/10. 7. Sec HPT - Last Ca 8.4 (9.9 corrected); Calcitriol on hold, Sensipar 30 mg qd, Renvela 1 with meals. 8. Nutrition - renal diet, vitamin, Nepro, eating well. 9. New DVT - 05/2014, restarted Coumadin 12/9 after cancelled fistulogram, INR 1.68  10. DNR- [facility]. Per palliative care notes, plan is for OP re-evaluation of her state during dialysis-daughters plan to be present at dialysis to determine if they are interested in pursuing full medical care or if they wish to pursue palliative care as an outpatient which can be arranged at skilled nursing facility   LOS: 11 days   LYLES,CHARLES 06/23/2014,7:02 AM  Pt seen, examined, agree w assess/plan as above with additions as indicated.  Patient remains pleasantly demented and can hold a conversation but doesn't understand her medical problems or remember things from day to day. Suspect that at some point dialysis will become difficult to do given her severe memory problems. For now plan is for re-evaluation by family at OP HD to see how she is progressing, as noted above.  Vinson Moselleob Kayli Beal MD pager [phone]    cell [phone] 06/23/2014, 12:34 PM

## 2014-06-23 NOTE — Progress Notes (Signed)
Report given to charge nurse "Marisue IvanLiz" [facility].

## 2014-06-23 NOTE — Progress Notes (Signed)
ANTICOAGULATION CONSULT NOTE - Follow Up Consult  Pharmacy Consult:  Coumadin Indication: hx DVT  Allergies  Allergen Reactions  . Nsaids     REACTION: Avoid NSAIDS(renal failure)    Patient Measurements: Height: 5' (152.4 cm) Weight: 117 lb 12.8 oz (53.434 kg) IBW/kg (Calculated) : 45.5  Height: 5' IBW: 45.5 kg   Labs:  Recent Labs  06/21/14 0830  06/21/14 0950 06/21/14 1320 06/22/14 0445 06/23/14 0440  HGB  --   < > 9.3*  --  10.4* 10.4*  HCT  --   --  29.7*  --  34.2* 34.9*  PLT  --   --  172  --  189 204  LABPROT  --   --   --  25.9* 28.9* 27.4*  INR  --   --   --  2.34* 2.70* 2.53*  CREATININE 1.67*  --   --   --   --   --   < > = values in this interval not displayed.     Assessment: [age] YOF to continue on Coumadin for history of DVT in November.  INR = 2.54 remains therapeutic today.  Chronic anemia with acute component of ecchymosis 12/10, previously noted documentation of large hematoma on upper arm on 06/19/14 , that now looks like generalized bruising; echymiosis improved.  Has received 2 units PRBCs. Aranesp 200 mcg on Thurs, completed Fe loading 12/10.Hgb stable at 10.4 and PLTC stable at 204. No bleeding reported.   Prior to admit, coumadin dose was 5 mg daily. INR was 3.13 (slightly supratherapeutic) on admit date 06/13/14 with last dose taken on 12/1 (was held due to surgery). Restarted Coumadin 12/9 after cancelled fistulogram.  Goal of Therapy:  INR 2-3      Plan:  - Coumadin 4mg  PO today - Daily PT / INR   Noah Delaineuth Pia Jedlicka, RPh Clinical Pharmacist Pager: [phone] 06/23/2014, 10:28 AM

## 2014-06-23 NOTE — Discharge Summary (Addendum)
Physician Discharge Summary  [name] ZOX:[id number] DOB: [birth year] DOA: 06/12/2014  PCP: Dorrene GermanAVBUERE,[name], MD  Admit date: 06/12/2014 Discharge date: 06/23/2014  Time spent: 45 minutes  Recommendations for Outpatient Follow-up:  1. Recommend Ativan 1 hour prior to onset of dialysis given discomfort and anxiety at dialysis 2. Recommend pursuing goals of care at nursing facility and palliative care if patient fails to thrive 3. Recommend lab work at next dialysis 4. [facility] 5. Recommend adding Florinef to Midodrin if persistently hypotensive at dialysis 6. Patient will be discharged to skilled nursing facility  7. Recommend INR check in 3-4 days given she is on Coumadin for DVT  Discharge Diagnoses:  Principal Problem:   SOB (shortness of breath) Active Problems:   Asthma   ESRD on dialysis   Dementia with behavioral disturbance   DVT (deep venous thrombosis)   Anxiety state   Protein-calorie malnutrition, severe   Palliative care encounter   Decreased appetite   Discharge Condition: Guarded  Diet recommendation: Heart healthy renal diet  Filed Weights   06/21/14 1400 06/21/14 2139 06/22/14 2010  Weight: 53.7 kg (118 lb 6.2 oz) 53.4 kg (117 lb 11.6 oz) 53.434 kg (117 lb 12.8 oz)    History of present illness:  80 year old female, H/o T/Th/sat admitted 12/4 increasing shortness of breath.  Some concern that right-sided shunt not working properly. Patient was admitted and evaluated as below  Hospital Course:  Acute encephalopathy secondary to hypoxemia from volume overload -Also contributors = underlying dementia -Vitamin B12: 359 (low normal), was given B12 injection 06/14/2014 -TSH 0.821 -Spoke with daughter, this may be underlying dementia and patient has not been able to tolerate dialsysis -Palliative care consulted, input appreciated from Ms Jimmey Ralpharker- they recommended tramadol and Ativan -OP re-evaluation of her  state during dialysis-daughters plan to be present at dialysis to determine if they are interested in pursuing full medical care or if they wish to pursue palliative care as an outpatient which can be arranged at skilled nursing facility  Dyspnea -Resolved, Likely multifactorial including missed dialysis with volume overload versus anxiety -CXR: Cardiomegaly without evidence of active cardiopulmonary disease -VQ scan: very low probability for PE  Diarrhea -Per patient, has resolved (patient had one episode of diarrhea in the emergency department) - secondary to laxative  -C. difficile PCR 12/8 neg  Pyelonephritis unclear source -UA: WBC TNTC, many bacteria, large leukocytes -Rocephin d/c 06/18/14 as has received 4 days IV therpay -urine culture not received at admission  ESRD on hemodialysis -Patient dialyzes on Tuesday, Thursday, Saturday -Nephrology consulted and appreciated -Patient was scheduled for shuntogram with Dr. Darrick Pennafields 12/4, however canceled -Spoke with Dr. Darrick PennaFields, patient Reevaluated and working shunt  Asthma -Currently stable, patient is not coughing or wheezing at this time  Chronic anemia with acute component of ecchymosis 12/10 -Likely secondary to ESRD -2 U PRBC trasnfused -Hemoglobin stable at 10.2 now -Continue aranesp and IV Iron -Echymosis improved  Lower extremity DVT 05/2014 with supratherapeutic INR -[facility].53 [facility] -Needs outpatient reevaluation with INR in about 3-4 days -heparin d/c 12/9  Hypotension -Continue Midodrine -still hypotensive with dialysis  -Consider Florinef if continuing aggressive medical care  Severe malnutrition -continue feeding supplements -Nutrition consulted  Goals of care -Palliative care consulted and appreciated.  Procedures  None  Consults  Nephrology Vascular Surgery, Dr. Darrick PennaFields Palliative care  DVT Prophylaxis Heparin   Discharge Exam: Filed Vitals:   06/23/14 0420  BP: 105/57  Pulse: 71  Temp: 97.9 F (36.6 C)  Resp: 17    Patient is pleasant but confused today. She thinks one of the nurse techs wants her to give her $40,000 Her son is at the bedside Patient had a large breakfast of bacon and eggs and toast which she does not remember eating  General: Alert Cardiovascular: S1-S2 no murmur rub or gallop Respiratory: Clinically clear Fistula appears bruised and left arm appears swollen however this is decreased from prior. No lower extremity edema no JVD  Discharge Instructions You were cared for by a hospitalist during your hospital stay. If you have any questions about your discharge medications or the care you received while you were in the hospital after you are discharged, you can call the unit and asked to speak with the hospitalist on call if the hospitalist that took care of you is not available. Once you are discharged, your primary care physician will handle any further medical issues. Please note that NO REFILLS for any discharge medications will be authorized once you are discharged, as it is imperative that you return to your primary care physician (or establish a relationship with a primary care physician if you do not have one) for your aftercare needs so that they can reassess your need for medications and monitor your lab values.   Current Discharge Medication List    START taking these medications   Details  traMADol (ULTRAM) 50 MG tablet Take 1 tablet (50 mg total) by mouth every 6 (six) hours as needed for moderate pain. Qty: 30 tablet, Refills: 0      CONTINUE these medications which have CHANGED   Details  LORazepam (ATIVAN) 0.5 MG tablet Take 1 tablet (0.5 mg total) by mouth every 12 (twelve) hours as needed for anxiety. Also-may give 1 hour prior to dialysis Qty: 30 tablet, Refills: 0      CONTINUE these medications which have NOT CHANGED   Details  albuterol (PROAIR HFA) 108 (90 BASE) MCG/ACT inhaler Inhale 1 puff into the lungs  at bedtime.    cinacalcet (SENSIPAR) 30 MG tablet Take 1 tablet (30 mg total) by mouth daily with breakfast. Qty: 30 tablet, Refills: 3    midodrine (PROAMATINE) 10 MG tablet Take 1 tablet (10 mg total) by mouth 3 (three) times daily with meals. Qty: 90 tablet, Refills: 3    multivitamin (RENA-VIT) TABS tablet Take 1 tablet by mouth daily.    nitroGLYCERIN (NITROSTAT) 0.4 MG SL tablet Place 0.4 mg under the tongue every 5 (five) minutes as needed for chest pain.    Nutritional Supplements (FEEDING SUPPLEMENT, NEPRO CARB STEADY,) LIQD Take 237 mLs by mouth 2 (two) times daily between meals. Qty: 60 Can, Refills: 3    warfarin (COUMADIN) 5 MG tablet Take 1 tablet (5 mg total) by mouth daily at 6 PM. Qty: 30 tablet, Refills: 3    HYDROcodone-acetaminophen (NORCO/VICODIN) 5-325 MG per tablet Take 1-2 tablets by mouth every 4 (four) hours as needed for moderate pain or severe pain. Qty: 30 tablet, Refills: 0      STOP taking these medications     enoxaparin (LOVENOX) 60 MG/0.6ML injection      sevelamer carbonate (RENVELA) 800 MG tablet        Allergies  Allergen Reactions  . Nsaids     REACTION: Avoid NSAIDS(renal failure)      The results of significant diagnostics from this hospitalization (including imaging, microbiology, ancillary and laboratory) are listed below for reference.  Significant Diagnostic Studies: Ct Head Wo Contrast  06/05/2014   CLINICAL DATA:  Intermittent confusion associated with hypertension.  EXAM: CT HEAD WITHOUT CONTRAST  TECHNIQUE: Contiguous axial images were obtained from the base of the skull through the vertex without intravenous contrast.  COMPARISON:  None.  FINDINGS: Skull and Sinuses:Negative for fracture or destructive process. The mastoids, middle ears, and imaged paranasal sinuses are clear.  Orbits: No acute abnormality.  Brain: No evidence of acute abnormality, such as acute infarction, hemorrhage, hydrocephalus, or mass lesion/mass  effect. There is generalized volume loss consistent with atrophy. Mild chronic small vessel disease with ischemic gliosis noted around the frontal horn of the left lateral ventricle.  IMPRESSION: 1. No acute intracranial disease. 2. Brain atrophy and mild chronic small vessel ischemia.   Electronically Signed   By: Tiburcio Pea M.D.   On: 06/05/2014 03:17   Nm Pulmonary Perf And Vent  06/15/2014   CLINICAL DATA:  80 year old female dialysis patient presenting with chest pain.  EXAM: NUCLEAR MEDICINE VENTILATION - PERFUSION LUNG SCAN  TECHNIQUE: Ventilation images were obtained in multiple projections using inhaled aerosol technetium 99 M DTPA. Perfusion images were obtained in multiple projections after intravenous injection of Tc-46m MAA.  RADIOPHARMACEUTICALS:  30.0 mCi Tc-48m DTPA aerosol and 5.0 mCi Tc-97m MAA  COMPARISON:  No priors.  FINDINGS: Ventilation: Markedly heterogeneous ventilation with large amount of deposition of radiotracer in the hilar regions bilaterally.  Perfusion: No wedge shaped peripheral perfusion defects to suggest acute pulmonary embolism.  IMPRESSION: 1. Very low probability (0-9%) for pulmonary embolism.   Electronically Signed   By: Trudie Reed M.D.   On: 06/15/2014 10:26   Dg Chest Portable 1 View  06/13/2014   CLINICAL DATA:  Acute shortness of breath.  Initial encounter.  EXAM: PORTABLE CHEST - 1 VIEW  COMPARISON:  06/04/2014 prior radiographs  FINDINGS: Cardiomegaly and mild left basilar scarring again noted.  There is no evidence of focal airspace disease, pulmonary edema, suspicious pulmonary nodule/mass, pleural effusion, or pneumothorax. No acute bony abnormalities are identified.  IMPRESSION: Cardiomegaly without evidence of active cardiopulmonary disease.   Electronically Signed   By: Laveda Abbe M.D.   On: 06/13/2014 01:06   Dg Chest Port 1 View  06/04/2014   CLINICAL DATA:  Asthma and hypertension.  Shortness of breath  EXAM: PORTABLE CHEST - 1 VIEW   COMPARISON:  05/10/2014  FINDINGS: Normal heart size. No pleural effusion or edema. Lung volumes are low. No airspace consolidation. Coarsened interstitial markings are identified bilaterally.  IMPRESSION: 1. Low lung volumes. 2. No acute findings noted.   Electronically Signed   By: Signa Kell M.D.   On: 06/04/2014 23:45    Microbiology: Recent Results (from the past 240 hour(s))  Clostridium Difficile by PCR     Status: None   Collection Time: 06/17/14  6:00 PM  Result Value Ref Range Status   C difficile by pcr NEGATIVE NEGATIVE Final     Labs: Basic Metabolic Panel:  Recent Labs Lab 06/17/14 0545 06/18/14 0134 06/19/14 0500 06/21/14 0830  NA 138 138 137 143  K 4.2 3.5* 3.5* 3.6*  CL 99 97 98 107  CO2 24 28 26 29   GLUCOSE 81 70 70 81  BUN 24* 9 17 7   CREATININE 4.64* 2.49* 4.18* 1.67*  CALCIUM 8.5 8.4 8.3* 7.8*  MG  --   --  1.8  --   PHOS  --   --  3.4 1.1*   Liver Function Tests:  Recent Labs Lab 06/19/14 0500 06/21/14 0830  AST 12  --   ALT 7  --   ALKPHOS 61  --   BILITOT 0.4  --   PROT 5.4*  --   ALBUMIN 2.1* 1.9*   No results for input(s): LIPASE, AMYLASE in the last 168 hours. No results for input(s): AMMONIA in the last 168 hours. CBC:  Recent Labs Lab 06/19/14 0500 06/20/14 0514 06/21/14 0950 06/22/14 0445 06/23/14 0440  WBC 10.7* 8.9 11.4* 10.3 10.2  HGB 6.5* 10.2* 9.3* 10.4* 10.4*  HCT 21.0* 31.6* 29.7* 34.2* 34.9*  MCV 93.3 95.5 95.2 97.4 98.6  PLT 166 163 172 189 204   Cardiac Enzymes: No results for input(s): CKTOTAL, CKMB, CKMBINDEX, TROPONINI in the last 168 hours. BNP: BNP (last 3 results) No results for input(s): PROBNP in the last 8760 hours. CBG: No results for input(s): GLUCAP in the last 168 hours.     SignedRhetta Mura:  Aleksis Jiggetts, JAI-GURMUKH  [facility] 06/23/2014, 11:57 AM

## 2014-06-24 ENCOUNTER — Encounter: Payer: Self-pay | Admitting: Adult Health

## 2014-06-24 ENCOUNTER — Non-Acute Institutional Stay (SKILLED_NURSING_FACILITY): Payer: PRIVATE HEALTH INSURANCE | Admitting: Adult Health

## 2014-06-24 DIAGNOSIS — F03918 Unspecified dementia, unspecified severity, with other behavioral disturbance: Secondary | ICD-10-CM

## 2014-06-24 DIAGNOSIS — F411 Generalized anxiety disorder: Secondary | ICD-10-CM

## 2014-06-24 DIAGNOSIS — F0391 Unspecified dementia with behavioral disturbance: Secondary | ICD-10-CM

## 2014-06-24 DIAGNOSIS — N186 End stage renal disease: Secondary | ICD-10-CM

## 2014-06-24 DIAGNOSIS — E43 Unspecified severe protein-calorie malnutrition: Secondary | ICD-10-CM

## 2014-06-24 DIAGNOSIS — R5381 Other malaise: Secondary | ICD-10-CM

## 2014-06-24 DIAGNOSIS — I953 Hypotension of hemodialysis: Secondary | ICD-10-CM

## 2014-06-24 DIAGNOSIS — J4520 Mild intermittent asthma, uncomplicated: Secondary | ICD-10-CM

## 2014-06-24 DIAGNOSIS — I82401 Acute embolism and thrombosis of unspecified deep veins of right lower extremity: Secondary | ICD-10-CM

## 2014-06-24 DIAGNOSIS — Z992 Dependence on renal dialysis: Secondary | ICD-10-CM

## 2014-06-24 NOTE — Progress Notes (Signed)
Patient ID: [name], female   DOB: 1932-10-31, 80 y.o.   MRN: [id number]   06/24/2014  Facility:  Nursing Home Location:  [facility] Room Number: 1004 -1 LEVEL OF CARE:  SNF (31)   Chief Complaint  Patient presents with  . Hospitalization Follow-up     Physical deconditioning, asthma, ESRD, dementia, DVT, anxiety, protein calorie malnutrition and hypotension    HISTORY OF PRESENT ILLNESS:  This is a 80 year old female who [facility] Shortness of breath which is likely multifactorial: Missed dialysis with volume overload versus anxiety. Patient has been admitted for a short-term rehabilitation.  REASSESSMENT OF ONGOING PROBLEMS:  ASTHMA: The patient's asthma remains stable. Patient denies shortness of breath, dyspnea on exertion or wheezing. No complications reported from the medications currently being used.   HYPOTENSION: The orthostatcic hypotension remains stable.  Pt is tolerating current medications without any adverse reactions.  No dizziness or lightheadedness reported. Last BP 124/69  ANXIETY: The anxiety remains stable. Patient denies ongoing anxiety or irritability. No complications reported from the medications currently being used.   PAST MEDICAL HISTORY:  Past Medical History  Diagnosis Date  . Gout   . Renal insufficiency   . Hypertension   . Hyperlipemia     CURRENT MEDICATIONS: Reviewed per MAR/see medication list  Allergies  Allergen Reactions  . Nsaids     REACTION: Avoid NSAIDS(renal failure)     REVIEW OF SYSTEMS:  GENERAL: no fatigue, no weight changes, no fever, chills or weakness RESPIRATORY: no cough, wheezing, hemoptysis CARDIAC: no chest pain, edema or palpitations GI: no abdominal pain, diarrhea, constipation, heart burn, nausea or vomiting  PHYSICAL EXAMINATION  GENERAL: no acute distress, normal body habitus EYES: conjunctivae  normal, sclerae normal, normal eye lids NECK: supple, trachea midline, no neck masses, no thyroid tenderness, no thyromegaly LYMPHATICS: no LAN in the neck, no supraclavicular LAN RESPIRATORY: breathing is even & unlabored, BS CTAB CARDIAC: RRR, no murmur,no extra heart sounds, no edema GI: abdomen soft, normal BS, no masses, no tenderness, no hepatomegaly, no splenomegaly EXTREMITIES: Able to well 4 extremities; Left arm AV shunt + for bruit and thrill PSYCHIATRIC: the patient is alert & oriented to person, affect & behavior appropriate  LABS/RADIOLOGY: Labs reviewed: Basic Metabolic Panel:  Recent Labs  40/98/1110/11/23 1717  06/18/14 0134 06/19/14 0500 06/21/14 0830  NA 138  < > 138 137 143  K 3.7  < > 3.5* 3.5* 3.6*  CL 99  < > 97 98 107  CO2 27  < > 28 26 29   GLUCOSE 84  < > 70 70 81  BUN 28*  < > 9 17 7   CREATININE 5.42*  < > 2.49* 4.18* 1.67*  CALCIUM 8.3*  < > 8.4 8.3* 7.8*  MG  --   --   --  1.8  --   PHOS 3.3  --   --  3.4 1.1*  < > = values in this interval not displayed. Liver Function Tests:  Recent Labs  06/05/14 0317  06/13/14 0600 06/14/14 1717 06/19/14 0500 06/21/14 0830  AST 16  --  19  --  12  --   ALT 8  --  9  --  7  --   ALKPHOS 55  --  58  --  61  --   BILITOT 0.4  --  0.3  --  0.4  --  PROT 5.8*  --  5.9*  --  5.4*  --   ALBUMIN 2.1*  < > 2.2* 2.2* 2.1* 1.9*  < > = values in this interval not displayed.   Recent Labs  06/05/14 0317  AMMONIA 13   CBC:  Recent Labs  06/05/14 0317  06/13/14 0037  06/13/14 0600  06/21/14 0950 06/22/14 0445 06/23/14 0440  WBC 6.4  < > 10.1  --  9.4  < > 11.4* 10.3 10.2  NEUTROABS 4.7  --  5.0  --  8.4*  --   --   --   --   HGB 9.6*  < > 9.6*  < > 8.9*  < > 9.3* 10.4* 10.4*  HCT 30.3*  < > 30.8*  < > 28.5*  < > 29.7* 34.2* 34.9*  MCV 94.1  < > 95.4  --  95.6  < > 95.2 97.4 98.6  PLT 180  < > 217  --  207  < > 172 189 204  < > = values in this interval not displayed.  Cardiac Enzymes:  Recent Labs   06/05/14 0317 06/13/14 0600  TROPONINI <0.30 <0.30   CBG:  Recent Labs  06/06/14 0818  GLUCAP 76     Ct Head Wo Contrast  06/05/2014   CLINICAL DATA:  Intermittent confusion associated with hypertension.  EXAM: CT HEAD WITHOUT CONTRAST  TECHNIQUE: Contiguous axial images were obtained from the base of the skull through the vertex without intravenous contrast.  COMPARISON:  None.  FINDINGS: Skull and Sinuses:Negative for fracture or destructive process. The mastoids, middle ears, and imaged paranasal sinuses are clear.  Orbits: No acute abnormality.  Brain: No evidence of acute abnormality, such as acute infarction, hemorrhage, hydrocephalus, or mass lesion/mass effect. There is generalized volume loss consistent with atrophy. Mild chronic small vessel disease with ischemic gliosis noted around the frontal horn of the left lateral ventricle.  IMPRESSION: 1. No acute intracranial disease. 2. Brain atrophy and mild chronic small vessel ischemia.   Electronically Signed   By: Tiburcio PeaJonathan  Watts M.D.   On: 06/05/2014 03:17   Nm Pulmonary Perf And Vent  06/15/2014   CLINICAL DATA:  80 year old female dialysis patient presenting with chest pain.  EXAM: NUCLEAR MEDICINE VENTILATION - PERFUSION LUNG SCAN  TECHNIQUE: Ventilation images were obtained in multiple projections using inhaled aerosol technetium 99 M DTPA. Perfusion images were obtained in multiple projections after intravenous injection of Tc-6878m MAA.  RADIOPHARMACEUTICALS:  30.0 mCi Tc-5878m DTPA aerosol and 5.0 mCi Tc-5478m MAA  COMPARISON:  No priors.  FINDINGS: Ventilation: Markedly heterogeneous ventilation with large amount of deposition of radiotracer in the hilar regions bilaterally.  Perfusion: No wedge shaped peripheral perfusion defects to suggest acute pulmonary embolism.  IMPRESSION: 1. Very low probability (0-9%) for pulmonary embolism.   Electronically Signed   By: Trudie Reedaniel  Entrikin M.D.   On: 06/15/2014 10:26   Dg Chest Portable 1  View  06/13/2014   CLINICAL DATA:  Acute shortness of breath.  Initial encounter.  EXAM: PORTABLE CHEST - 1 VIEW  COMPARISON:  06/04/2014 prior radiographs  FINDINGS: Cardiomegaly and mild left basilar scarring again noted.  There is no evidence of focal airspace disease, pulmonary edema, suspicious pulmonary nodule/mass, pleural effusion, or pneumothorax. No acute bony abnormalities are identified.  IMPRESSION: Cardiomegaly without evidence of active cardiopulmonary disease.   Electronically Signed   By: Laveda AbbeJeff  Hu M.D.   On: 06/13/2014 01:06   Dg Chest Port 1 View  06/04/2014  CLINICAL DATA:  Asthma and hypertension.  Shortness of breath  EXAM: PORTABLE CHEST - 1 VIEW  COMPARISON:  05/10/2014  FINDINGS: Normal heart size. No pleural effusion or edema. Lung volumes are low. No airspace consolidation. Coarsened interstitial markings are identified bilaterally.  IMPRESSION: 1. Low lung volumes. 2. No acute findings noted.   Electronically Signed   By: Signa Kell M.D.   On: 06/04/2014 23:45    ASSESSMENT/PLAN:  Physical deconditioning - for rehabilitation Asthma - stable; continue pro-air HFA Dementia with behavioral disturbances - continue Ativan when necessary Right lower extremity DVT - INR 3.7-supratherapeutic; hold Coumadin and check INR on 06/25/14 Anxiety - continue Ativan 0.5 mg 1 tab by mouth every 12 hours when necessary Protein calorie malnutrition, severe - continue supplementation and check CMP Hypotension - continue midodrine 10 mg by mouth 3 times a day ESRD - continue hemodialysis on Tuesday, Thursday and Saturday, continue Sensipar 30 mg by mouth daily   Spent 50 minutes in patient care.     [facility], NP [name] [phone]

## 2014-06-27 ENCOUNTER — Encounter: Payer: Self-pay | Admitting: Internal Medicine

## 2014-06-27 ENCOUNTER — Non-Acute Institutional Stay (SKILLED_NURSING_FACILITY): Payer: PRIVATE HEALTH INSURANCE | Admitting: Internal Medicine

## 2014-06-27 DIAGNOSIS — I82401 Acute embolism and thrombosis of unspecified deep veins of right lower extremity: Secondary | ICD-10-CM

## 2014-06-27 DIAGNOSIS — R5381 Other malaise: Secondary | ICD-10-CM

## 2014-06-27 DIAGNOSIS — F0391 Unspecified dementia with behavioral disturbance: Secondary | ICD-10-CM

## 2014-06-27 DIAGNOSIS — Z992 Dependence on renal dialysis: Secondary | ICD-10-CM

## 2014-06-27 DIAGNOSIS — F03918 Unspecified dementia, unspecified severity, with other behavioral disturbance: Secondary | ICD-10-CM

## 2014-06-27 DIAGNOSIS — I953 Hypotension of hemodialysis: Secondary | ICD-10-CM

## 2014-06-27 DIAGNOSIS — N186 End stage renal disease: Secondary | ICD-10-CM

## 2014-06-27 DIAGNOSIS — J4520 Mild intermittent asthma, uncomplicated: Secondary | ICD-10-CM

## 2014-06-27 DIAGNOSIS — E46 Unspecified protein-calorie malnutrition: Secondary | ICD-10-CM

## 2014-06-27 NOTE — Progress Notes (Signed)
Patient ID: [name], female   DOB: 07/17/1932, 80 y.o.   MRN: [id number]     [facility]   PCP: Dorrene German, MD  Code Status: DNR  Allergies  Allergen Reactions  . Nsaids     REACTION: Avoid NSAIDS(renal failure)    Chief Complaint  Patient presents with  . New Admit To SNF     HPI:  80 y/o female patient is here for rehabilitation post hospital admission from 06/12/14-06/23/14 with acute encephalopathy and dyspnea. She responded well to dialysis. She received a brief course of antibiotic for presumed uti in hospital. Her dementia was thought to be contributing to her encephalopathy. She received 2 u prbc in hospital. She was continued on midodrine for post dialysis hypotensive episodes. She was on coumadin bridged with heparin for DVT in hospital. Palliative care was consulted in the hospital. She has PMH of dementia, ESRD on HD, asthma, anemia, protein calorie malnutrition She is seen in the room today with her daughter present. She is pleasantly confused. She has been refusing dialysis but went for it yesterday. She had a bowel movement this am. She denies any concerns  Review of Systems:  Constitutional: Negative for fever, chills, diaphoresis.  HENT: Negative for congestion.   Respiratory: Negative for cough, shortness of breath and wheezing.   Cardiovascular: Negative for chest pain, palpitations, leg swelling.  Gastrointestinal: Negative for heartburn, nausea, vomiting, abdominal pain Genitourinary: Negative for dysuria  Musculoskeletal: Negative for back pain, falls Skin: Negative for itching, rash.  Neurological: Negative for weakness,dizziness, headaches.  Psychiatric/Behavioral: Negative for depression. Has memory loss.  Past Medical History  Diagnosis Date  . Gout   . Renal insufficiency   . Hypertension   . Hyperlipemia    Past Surgical History  Procedure Laterality Date  . Tubal ligation    . Av fistula  placement     Social History:   reports that she has never smoked. She has never used smokeless tobacco. She reports that she does not drink alcohol or use illicit drugs.  Family History  Problem Relation Age of Onset  . Colon cancer Neg Hx   . Diabetes Mellitus II Neg Hx     Medications: Patient's Medications  New Prescriptions   No medications on file  Previous Medications   ALBUTEROL (PROAIR HFA) 108 (90 BASE) MCG/ACT INHALER    Inhale 1 puff into the lungs at bedtime.   CINACALCET (SENSIPAR) 30 MG TABLET    Take 1 tablet (30 mg total) by mouth daily with breakfast.   HYDROCODONE-ACETAMINOPHEN (NORCO/VICODIN) 5-325 MG PER TABLET    Take 1-2 tablets by mouth every 4 (four) hours as needed for moderate pain or severe pain.   LORAZEPAM (ATIVAN) 0.5 MG TABLET    Take 1 tablet (0.5 mg total) by mouth every 12 (twelve) hours as needed for anxiety. Also-may give 1 hour prior to dialysis   MIDODRINE (PROAMATINE) 10 MG TABLET    Take 1 tablet (10 mg total) by mouth 3 (three) times daily with meals.   MULTIVITAMIN (RENA-VIT) TABS TABLET    Take 1 tablet by mouth daily.   NITROGLYCERIN (NITROSTAT) 0.4 MG SL TABLET    Place 0.4 mg under the tongue every 5 (five) minutes as needed for chest pain.   NUTRITIONAL SUPPLEMENTS (FEEDING SUPPLEMENT, NEPRO CARB STEADY,) LIQD    Take 237 mLs by mouth 2 (two) times daily between meals.   TRAMADOL (ULTRAM) 50 MG TABLET    Take  1 tablet (50 mg total) by mouth every 6 (six) hours as needed for moderate pain.   WARFARIN (COUMADIN) 5 MG TABLET    Take 1 tablet (5 mg total) by mouth daily at 6 PM.  Modified Medications   No medications on file  Discontinued Medications   No medications on file     Physical Exam: Filed Vitals:   06/27/14 0923  BP: 124/69  Pulse: 82  Temp: 97.1 F (36.2 C)  Resp: 18  SpO2: 98%    General- elderly female in no acute distress Head- atraumatic, normocephalic Eyes- PERRLA, EOMI, no pallor, no icterus, no  discharge Neck- no cervical lymphadenopathy Throat- moist mucus membrane Cardiovascular- normal s1,s2, no murmurs Respiratory- bilateral clear to auscultation, no wheeze, no rhonchi, no crackles, no use of accessory muscles Abdomen- bowel sounds present, soft, non tender Musculoskeletal- able to move all 4 extremities, no leg edema, swelling in left arm, good radial pulses Neurological- no focal deficit Skin- warm and dry, left arm fistula Psychiatry- alert but not oriented to person, place and time, normal mood and affect   Labs reviewed: Basic Metabolic Panel:  Recent Labs  16/04/9611/05/15 1717  06/18/14 0134 06/19/14 0500 06/21/14 0830  NA 138  < > 138 137 143  K 3.7  < > 3.5* 3.5* 3.6*  CL 99  < > 97 98 107  CO2 27  < > 28 26 29   GLUCOSE 84  < > 70 70 81  BUN 28*  < > 9 17 7   CREATININE 5.42*  < > 2.49* 4.18* 1.67*  CALCIUM 8.3*  < > 8.4 8.3* 7.8*  MG  --   --   --  1.8  --   PHOS 3.3  --   --  3.4 1.1*  < > = values in this interval not displayed. Liver Function Tests:  Recent Labs  06/05/14 0317  06/13/14 0600 06/14/14 1717 06/19/14 0500 06/21/14 0830  AST 16  --  19  --  12  --   ALT 8  --  9  --  7  --   ALKPHOS 55  --  58  --  61  --   BILITOT 0.4  --  0.3  --  0.4  --   PROT 5.8*  --  5.9*  --  5.4*  --   ALBUMIN 2.1*  < > 2.2* 2.2* 2.1* 1.9*  < > = values in this interval not displayed. No results for input(s): LIPASE, AMYLASE in the last 8760 hours.  Recent Labs  06/05/14 0317  AMMONIA 13   CBC:  Recent Labs  06/05/14 0317  06/13/14 0037  06/13/14 0600  06/21/14 0950 06/22/14 0445 06/23/14 0440  WBC 6.4  < > 10.1  --  9.4  < > 11.4* 10.3 10.2  NEUTROABS 4.7  --  5.0  --  8.4*  --   --   --   --   HGB 9.6*  < > 9.6*  < > 8.9*  < > 9.3* 10.4* 10.4*  HCT 30.3*  < > 30.8*  < > 28.5*  < > 29.7* 34.2* 34.9*  MCV 94.1  < > 95.4  --  95.6  < > 95.2 97.4 98.6  PLT 180  < > 217  --  207  < > 172 189 204  < > = values in this interval not  displayed. Cardiac Enzymes:  Recent Labs  06/05/14 0317 06/13/14 0600  TROPONINI <0.30 <0.30  BNP: Invalid input(s): POCBNP CBG:  Recent Labs  06/06/14 0818  GLUCAP 76    Assessment/Plan  Physical deconditioning Will have her work with physical therapy and occupational therapy team to help with gait training and muscle strengthening exercises.fall precautions. Skin care. Encourage to be out of bed.   RLE DVT inr 2.2 on 06/26/14. On coumadin 4 mg daily with inr check 07/01/14.  Dementia with behavioral disturbance On prn ativan for anxiety/agitation. Decline anticipated. Will get palliative care consult with her refusal of care and overall decline. Continue skin care and fall precautions  [facility]  Protein calorie malnutrition With her dementia and medical co-morbidities, decline anticipated. Encourage po intake, continue supplements. Monitor for skin breakdown  ESRD On HD 3 days a week. Continue sensipar. Refusing it at times. Explained to family about fluid overload with her stopping dialysis and reviewing goals of care. Palliative care consult placed today  Hypotension bp stable at present. continue midodrine 10 mg by mouth tid  Family/ staff Communication: reviewed care plan with patient and nursing supervisor  Goals of care: short term rehabilitation   Labs/tests ordered: cbc, cmp    [name] GroutMAHIMA Nyjai Graff, MD  [facility] [phone] (Monday-Friday 8 am - 5 pm) [phone] (afterhours)

## 2014-07-02 ENCOUNTER — Emergency Department (HOSPITAL_COMMUNITY): Payer: PRIVATE HEALTH INSURANCE

## 2014-07-02 ENCOUNTER — Emergency Department (HOSPITAL_COMMUNITY)
Admission: EM | Admit: 2014-07-02 | Discharge: 2014-07-02 | Disposition: A | Discharge status: Home / Self Care | Payer: PRIVATE HEALTH INSURANCE | Attending: Emergency Medicine | Admitting: Emergency Medicine

## 2014-07-02 ENCOUNTER — Encounter (HOSPITAL_COMMUNITY): Payer: Self-pay | Admitting: *Deleted

## 2014-07-02 DIAGNOSIS — I120 Hypertensive chronic kidney disease with stage 5 chronic kidney disease or end stage renal disease: Secondary | ICD-10-CM | POA: Insufficient documentation

## 2014-07-02 DIAGNOSIS — Z992 Dependence on renal dialysis: Secondary | ICD-10-CM | POA: Insufficient documentation

## 2014-07-02 DIAGNOSIS — Z7901 Long term (current) use of anticoagulants: Secondary | ICD-10-CM | POA: Diagnosis not present

## 2014-07-02 DIAGNOSIS — Z8669 Personal history of other diseases of the nervous system and sense organs: Secondary | ICD-10-CM | POA: Insufficient documentation

## 2014-07-02 DIAGNOSIS — N186 End stage renal disease: Secondary | ICD-10-CM | POA: Insufficient documentation

## 2014-07-02 DIAGNOSIS — Z79899 Other long term (current) drug therapy: Secondary | ICD-10-CM | POA: Insufficient documentation

## 2014-07-02 DIAGNOSIS — Z9889 Other specified postprocedural states: Secondary | ICD-10-CM | POA: Insufficient documentation

## 2014-07-02 DIAGNOSIS — R609 Edema, unspecified: Secondary | ICD-10-CM

## 2014-07-02 DIAGNOSIS — R509 Fever, unspecified: Secondary | ICD-10-CM

## 2014-07-02 DIAGNOSIS — Z8739 Personal history of other diseases of the musculoskeletal system and connective tissue: Secondary | ICD-10-CM | POA: Insufficient documentation

## 2014-07-02 LAB — COMPREHENSIVE METABOLIC PANEL
ALK PHOS: 71 U/L (ref 39–117)
ALT: 13 U/L (ref 0–35)
AST: 26 U/L (ref 0–37)
Albumin: 2.3 g/dL — ABNORMAL LOW (ref 3.5–5.2)
Anion gap: 15 (ref 5–15)
BUN: 40 mg/dL — ABNORMAL HIGH (ref 6–23)
CO2: 22 mmol/L (ref 19–32)
Calcium: 8.3 mg/dL — ABNORMAL LOW (ref 8.4–10.5)
Chloride: 99 mEq/L (ref 96–112)
Creatinine, Ser: 5.26 mg/dL — ABNORMAL HIGH (ref 0.50–1.10)
GFR calc non Af Amer: 7 mL/min — ABNORMAL LOW (ref >90)
GFR, EST AFRICAN AMERICAN: 8 mL/min — AB (ref >90)
GLUCOSE: 112 mg/dL — AB (ref 70–99)
POTASSIUM: 3.6 mmol/L (ref 3.5–5.1)
SODIUM: 136 mmol/L (ref 135–145)
Total Bilirubin: 1.1 mg/dL (ref 0.3–1.2)
Total Protein: 5.8 g/dL — ABNORMAL LOW (ref 6.0–8.3)

## 2014-07-02 LAB — CBC WITH DIFFERENTIAL/PLATELET
BASOS ABS: 0 10*3/uL (ref 0.0–0.1)
Basophils Relative: 0 % (ref 0–1)
Eosinophils Absolute: 0 10*3/uL (ref 0.0–0.7)
Eosinophils Relative: 0 % (ref 0–5)
HCT: 31.5 % — ABNORMAL LOW (ref 36.0–46.0)
Hemoglobin: 10 g/dL — ABNORMAL LOW (ref 12.0–15.0)
LYMPHS ABS: 1.3 10*3/uL (ref 0.7–4.0)
LYMPHS PCT: 9 % — AB (ref 12–46)
MCH: 31.9 pg (ref 26.0–34.0)
MCHC: 31.7 g/dL (ref 30.0–36.0)
MCV: 100.6 fL — ABNORMAL HIGH (ref 78.0–100.0)
Monocytes Absolute: 0.6 10*3/uL (ref 0.1–1.0)
Monocytes Relative: 4 % (ref 3–12)
NEUTROS PCT: 87 % — AB (ref 43–77)
Neutro Abs: 12.2 10*3/uL — ABNORMAL HIGH (ref 1.7–7.7)
PLATELETS: 177 10*3/uL (ref 150–400)
RBC: 3.13 MIL/uL — AB (ref 3.87–5.11)
RDW: 20.3 % — ABNORMAL HIGH (ref 11.5–15.5)
WBC: 14.1 10*3/uL — AB (ref 4.0–10.5)

## 2014-07-02 LAB — URINALYSIS, ROUTINE W REFLEX MICROSCOPIC
BILIRUBIN URINE: NEGATIVE
GLUCOSE, UA: NEGATIVE mg/dL
Ketones, ur: NEGATIVE mg/dL
Nitrite: NEGATIVE
Protein, ur: 100 mg/dL — AB
SPECIFIC GRAVITY, URINE: 1.013 (ref 1.005–1.030)
UROBILINOGEN UA: 1 mg/dL (ref 0.0–1.0)
pH: 8 (ref 5.0–8.0)

## 2014-07-02 LAB — URINE MICROSCOPIC-ADD ON

## 2014-07-02 LAB — I-STAT CG4 LACTIC ACID, ED: Lactic Acid, Venous: 3.3 mmol/L — ABNORMAL HIGH (ref 0.5–2.2)

## 2014-07-02 MED ORDER — ALBUTEROL SULFATE HFA 108 (90 BASE) MCG/ACT IN AERS
2.0000 | INHALATION_SPRAY | Freq: Four times a day (QID) | RESPIRATORY_TRACT | Status: DC
  Administered 2014-07-02: 2 via RESPIRATORY_TRACT
  Filled 2014-07-02: qty 6.7

## 2014-07-02 NOTE — Progress Notes (Signed)
*  Preliminary Results* Left upper extremity venous duplex completed. Visualized veins of the left upper extremity are negative for deep and superficial vein thrombosis. Incidentally, a large heterogenous area measuring about 6cm was visualized in the proximal left upper arm, suggestive of a hematoma.  Preliminary results discussed with Dr.Lockwood.  07/02/2014 7:08 PM  Gertie FeyMichelle Covey Baller, RVT, RDCS, RDMS

## 2014-07-02 NOTE — ED Notes (Signed)
NOTIFIED DR. LOCKWOOD IN PERSON FOR PATIENTS LAB RESULTS OF CG4+ LACTIC ACID=3.30 mmol/L @18 :47 PM.

## 2014-07-02 NOTE — ED Notes (Signed)
Pt resting at this time; awaiting disposition

## 2014-07-02 NOTE — ED Notes (Signed)
US at bedside

## 2014-07-02 NOTE — ED Notes (Signed)
Pt to ED from home assisted by family c/o fever and swelling to L upper arm. Home health nurse found that patient felt warm today and noticed swelling to arm. Dialysis fistula recently removed and dialysis access placed to R chest wall. Pt had significant bruising and swelling noted to area. Hx of dementia, is at baseline per family. Pt also c/o dyspnea on exertion. Pt ambulated to restroom with steady gait and assistance from family. Reports shortness of breath after returning to room. Pt sats 98%, placed on 2L oxygen via [place].

## 2014-07-02 NOTE — ED Provider Notes (Signed)
CSN: [id number]     Arrival date & time 07/02/14  1738 History   First MD Initiated Contact with Patient 07/02/14 1756     Chief Complaint  Patient presents with  . Post-op Problem  . Fever     HPI  Patient presents after recent change in dialysis catheter placement, now with concern of fever, possible swelling about the left arm. The patient herself states that she feels"fine," though there is obvious evidence for memory loss. She does deny specific pain, change in her baseline dyspnea, or any nausea, confusion, lightheadedness. Per reports nursing staff felt that the patient has developed swelling in the left distal upper arm following a removal of dialysis access. Patient had right upper chest hemodialysis access port placed, and this does not seem to be an area of concern according to nursing home staff, with the patient. No report of new fever, though there was tactile warmth according to nursing home staff.   Past Medical History  Diagnosis Date  . Gout   . Renal insufficiency   . Hypertension   . Hyperlipemia    Past Surgical History  Procedure Laterality Date  . Tubal ligation    . Av fistula placement     Family History  Problem Relation Age of Onset  . Colon cancer Neg Hx   . Diabetes Mellitus II Neg Hx    History  Substance Use Topics  . Smoking status: Never Smoker   . Smokeless tobacco: Never Used  . Alcohol Use: No   OB History    No data available     Review of Systems  Constitutional:       Per HPI, otherwise negative  HENT:       Per HPI, otherwise negative  Respiratory:       Per HPI, otherwise negative  Cardiovascular:       Per HPI, otherwise negative  Gastrointestinal: Negative for nausea and vomiting.  Endocrine:       Negative aside from HPI  Genitourinary:       Neg aside from HPI   Musculoskeletal:       Per HPI, otherwise negative  Skin: Positive for color change.  Neurological: Negative for syncope.      Allergies    Nsaids  Home Medications   Prior to Admission medications   Medication Sig Start Date End Date Taking? Authorizing Provider  albuterol (PROAIR HFA) 108 (90 BASE) MCG/ACT inhaler Inhale 1 puff into the lungs at bedtime.    Historical Provider, MD  cinacalcet (SENSIPAR) 30 MG tablet Take 1 tablet (30 mg total) by mouth daily with breakfast. 06/09/14   Stephani PoliceMarianne L York, PA-C  HYDROcodone-acetaminophen (NORCO/VICODIN) 5-325 MG per tablet Take 1-2 tablets by mouth every 4 (four) hours as needed for moderate pain or severe pain. 05/10/14   [name], MD  LORazepam (ATIVAN) 0.5 MG tablet Take 1 tablet (0.5 mg total) by mouth every 12 (twelve) hours as needed for anxiety. Also-may give 1 hour prior to dialysis 06/23/14   Rhetta MuraJai-Gurmukh Samtani, MD  midodrine (PROAMATINE) 10 MG tablet Take 1 tablet (10 mg total) by mouth 3 (three) times daily with meals. 06/09/14   Stephani PoliceMarianne L York, PA-C  multivitamin (RENA-VIT) TABS tablet Take 1 tablet by mouth daily.    Historical Provider, MD  nitroGLYCERIN (NITROSTAT) 0.4 MG SL tablet Place 0.4 mg under the tongue every 5 (five) minutes as needed for chest pain.    Historical Provider, MD  Nutritional Supplements (FEEDING SUPPLEMENT,  NEPRO CARB STEADY,) LIQD Take 237 mLs by mouth 2 (two) times daily between meals. 06/09/14   Stephani PoliceMarianne L York, PA-C  traMADol (ULTRAM) 50 MG tablet Take 1 tablet (50 mg total) by mouth every 6 (six) hours as needed for moderate pain. 06/23/14   Rhetta MuraJai-Gurmukh Samtani, MD  warfarin (COUMADIN) 5 MG tablet Take 1 tablet (5 mg total) by mouth daily at 6 PM. 06/09/14   Marianne L York, PA-C   BP 75/31 mmHg  Pulse 120  Temp(Src) 98.4 F (36.9 C)  Resp 16  SpO2 97% Physical Exam  Constitutional: She is oriented to person, place, and time. She appears well-developed and well-nourished. No distress.  HENT:  Head: Normocephalic and atraumatic.  Eyes: Conjunctivae and EOM are normal.  Cardiovascular: Normal rate and regular rhythm.    Pulmonary/Chest: Effort normal and breath sounds normal. No stridor. No respiratory distress.  Abdominal: She exhibits no distension.  Musculoskeletal: She exhibits no edema.       Arms: Neurological: She is alert and oriented to person, place, and time. No cranial nerve deficit.  Skin: Skin is warm and dry.  Psychiatric: She has a normal mood and affect. Her speech is normal and behavior is normal. Cognition and memory are impaired.  Nursing note and vitals reviewed.   ED Course  Procedures (including critical care time) Labs Review Labs Reviewed  CBC WITH DIFFERENTIAL - Abnormal; Notable for the following:    WBC 14.1 (*)    RBC 3.13 (*)    Hemoglobin 10.0 (*)    HCT 31.5 (*)    MCV 100.6 (*)    RDW 20.3 (*)    Neutrophils Relative % 87 (*)    Neutro Abs 12.2 (*)    Lymphocytes Relative 9 (*)    All other components within normal limits  COMPREHENSIVE METABOLIC PANEL - Abnormal; Notable for the following:    Glucose, Bld 112 (*)    BUN 40 (*)    Creatinine, Ser 5.26 (*)    Calcium 8.3 (*)    Total Protein 5.8 (*)    Albumin 2.3 (*)    GFR calc non Af Amer 7 (*)    GFR calc Af Amer 8 (*)    All other components within normal limits  URINALYSIS, ROUTINE W REFLEX MICROSCOPIC - Abnormal; Notable for the following:    APPearance CLOUDY (*)    Hgb urine dipstick MODERATE (*)    Protein, ur 100 (*)    Leukocytes, UA MODERATE (*)    All other components within normal limits  URINE MICROSCOPIC-ADD ON - Abnormal; Notable for the following:    Squamous Epithelial / LPF MANY (*)    Bacteria, UA FEW (*)    Casts HYALINE CASTS (*)    All other components within normal limits  I-STAT CG4 LACTIC ACID, ED - Abnormal; Notable for the following:    Lactic Acid, Venous 3.30 (*)    All other components within normal limits  URINE CULTURE  CULTURE, BLOOD (ROUTINE X 2)  CULTURE, BLOOD (ROUTINE X 2)  I-STAT CG4 LACTIC ACID, ED    Imaging Review Dg Chest 2 View  07/02/2014    CLINICAL DATA:  Fever, line placement  EXAM: CHEST  2 VIEW  COMPARISON:  06/13/2014  FINDINGS: Heart size is mildly enlarged. Lungs are hypoaerated with crowding of the bronchovascular markings. Linear bilateral lower lobe scarring or atelectasis. Right-sided dual lumen catheter tip projects over the right atrium. Trace pleural effusions are present. No acute osseous  finding. No pneumothorax.  IMPRESSION: Right-sided IJ approach dual lumen catheter with tip over the right atrium.  Trace pleural effusions and bilateral lower lobe probable atelectasis. Minimal prominence of the interstitial markings could indicate very early interstitial edema.   Electronically Signed   By: Christiana Pellant M.D.   On: 07/02/2014 20:16    Cardiac : 95 sr, nml    After my initial evaluation I reviewed the patient's chart.   Update: On repeat exam the patient remains in no distress, smiling, laughing, interacting playfully.  Update: I discussed all findings with patient, and multiple family members. The patient has mild lab abnormalities, there is no evidence for infection. Patient always has low blood pressure, both according to family, and on chart review. The patient states that she feels well, wants to go home. Family members voiced an understanding of return precautions, and follow-up instructions, including dialysis attendance tomorrow.  MDM   Final diagnoses:  Fever   patient presents with concern of possible fever several days after change in her dialysis access sites. Here the patient is awake and alert, afebrile, denying complaints essentially throughout her emergency department course. Patient has minor lab abnormalities, no overt evidence of infection, nor thrombosis. Patient has a previously identified hematoma on the left proximal arm, that will require time for resolution, but no evidence for her logic or vascular compromise. With reassuring physical exam, and the aforementioned labs, vitals, she  was discharged to the care of her family to go to dialysis tomorrow, and follow-up with primary care.  Gerhard Munch, MD 07/02/14 [id number]

## 2014-07-02 NOTE — Discharge Instructions (Signed)
As discussed, your evaluation today has been largely reassuring.  But, it is important that you monitor your condition carefully, and do not hesitate to return to the ED if you develop new, or concerning changes in your condition. ? ?Otherwise, please follow-up with your physician for appropriate ongoing care. ? ?

## 2014-07-02 NOTE — ED Notes (Signed)
Pt in from home, had her dialysis fistula removed from her left arm and was it was placed to her right chest wall today, her home nurse checked on her tonight and felt like she had a fever and swelling to her left upper arm, pt at baseline mental status, confusion

## 2014-07-03 LAB — URINE CULTURE
CULTURE: NO GROWTH
Colony Count: NO GROWTH

## 2014-07-04 ENCOUNTER — Other Ambulatory Visit: Payer: Self-pay | Admitting: Internal Medicine

## 2014-07-05 ENCOUNTER — Emergency Department (HOSPITAL_COMMUNITY): Payer: PRIVATE HEALTH INSURANCE

## 2014-07-05 ENCOUNTER — Encounter (HOSPITAL_COMMUNITY): Payer: Self-pay | Admitting: *Deleted

## 2014-07-05 ENCOUNTER — Inpatient Hospital Stay (HOSPITAL_COMMUNITY)
Admission: EM | Admit: 2014-07-05 | Discharge: 2014-07-09 | DRG: 871 | Disposition: A | Discharge status: Home / Self Care | Payer: PRIVATE HEALTH INSURANCE | Attending: Internal Medicine | Admitting: Internal Medicine

## 2014-07-05 DIAGNOSIS — M109 Gout, unspecified: Secondary | ICD-10-CM | POA: Diagnosis present

## 2014-07-05 DIAGNOSIS — Z86718 Personal history of other venous thrombosis and embolism: Secondary | ICD-10-CM | POA: Diagnosis not present

## 2014-07-05 DIAGNOSIS — Z515 Encounter for palliative care: Secondary | ICD-10-CM

## 2014-07-05 DIAGNOSIS — I120 Hypertensive chronic kidney disease with stage 5 chronic kidney disease or end stage renal disease: Secondary | ICD-10-CM | POA: Diagnosis present

## 2014-07-05 DIAGNOSIS — D631 Anemia in chronic kidney disease: Secondary | ICD-10-CM | POA: Diagnosis present

## 2014-07-05 DIAGNOSIS — J948 Other specified pleural conditions: Secondary | ICD-10-CM

## 2014-07-05 DIAGNOSIS — I959 Hypotension, unspecified: Secondary | ICD-10-CM | POA: Diagnosis present

## 2014-07-05 DIAGNOSIS — R058 Other specified cough: Secondary | ICD-10-CM | POA: Diagnosis present

## 2014-07-05 DIAGNOSIS — B370 Candidal stomatitis: Secondary | ICD-10-CM | POA: Diagnosis not present

## 2014-07-05 DIAGNOSIS — E785 Hyperlipidemia, unspecified: Secondary | ICD-10-CM | POA: Diagnosis present

## 2014-07-05 DIAGNOSIS — D72829 Elevated white blood cell count, unspecified: Secondary | ICD-10-CM | POA: Diagnosis present

## 2014-07-05 DIAGNOSIS — A419 Sepsis, unspecified organism: Secondary | ICD-10-CM | POA: Diagnosis present

## 2014-07-05 DIAGNOSIS — Z66 Do not resuscitate: Secondary | ICD-10-CM | POA: Diagnosis present

## 2014-07-05 DIAGNOSIS — Z992 Dependence on renal dialysis: Secondary | ICD-10-CM

## 2014-07-05 DIAGNOSIS — N2581 Secondary hyperparathyroidism of renal origin: Secondary | ICD-10-CM | POA: Diagnosis present

## 2014-07-05 DIAGNOSIS — R000 Tachycardia, unspecified: Secondary | ICD-10-CM | POA: Diagnosis present

## 2014-07-05 DIAGNOSIS — R0602 Shortness of breath: Secondary | ICD-10-CM

## 2014-07-05 DIAGNOSIS — F0390 Unspecified dementia without behavioral disturbance: Secondary | ICD-10-CM | POA: Diagnosis present

## 2014-07-05 DIAGNOSIS — E43 Unspecified severe protein-calorie malnutrition: Secondary | ICD-10-CM | POA: Diagnosis present

## 2014-07-05 DIAGNOSIS — N186 End stage renal disease: Secondary | ICD-10-CM | POA: Diagnosis present

## 2014-07-05 DIAGNOSIS — I10 Essential (primary) hypertension: Secondary | ICD-10-CM | POA: Insufficient documentation

## 2014-07-05 DIAGNOSIS — Z888 Allergy status to other drugs, medicaments and biological substances status: Secondary | ICD-10-CM | POA: Diagnosis not present

## 2014-07-05 DIAGNOSIS — Z7189 Other specified counseling: Secondary | ICD-10-CM

## 2014-07-05 DIAGNOSIS — J90 Pleural effusion, not elsewhere classified: Secondary | ICD-10-CM | POA: Diagnosis present

## 2014-07-05 DIAGNOSIS — R05 Cough: Secondary | ICD-10-CM

## 2014-07-05 DIAGNOSIS — I471 Supraventricular tachycardia: Secondary | ICD-10-CM

## 2014-07-05 DIAGNOSIS — J189 Pneumonia, unspecified organism: Secondary | ICD-10-CM | POA: Diagnosis present

## 2014-07-05 DIAGNOSIS — N189 Chronic kidney disease, unspecified: Secondary | ICD-10-CM | POA: Diagnosis present

## 2014-07-05 DIAGNOSIS — R059 Cough, unspecified: Secondary | ICD-10-CM

## 2014-07-05 LAB — CBC
HCT: 32.4 % — ABNORMAL LOW (ref 36.0–46.0)
HEMOGLOBIN: 10.1 g/dL — AB (ref 12.0–15.0)
MCH: 30.4 pg (ref 26.0–34.0)
MCHC: 31.2 g/dL (ref 30.0–36.0)
MCV: 97.6 fL (ref 78.0–100.0)
Platelets: 257 10*3/uL (ref 150–400)
RBC: 3.32 MIL/uL — ABNORMAL LOW (ref 3.87–5.11)
RDW: 19.1 % — AB (ref 11.5–15.5)
WBC: 18.4 10*3/uL — AB (ref 4.0–10.5)

## 2014-07-05 LAB — BASIC METABOLIC PANEL
ANION GAP: 13 (ref 5–15)
BUN: 39 mg/dL — AB (ref 6–23)
CHLORIDE: 97 meq/L (ref 96–112)
CO2: 24 mmol/L (ref 19–32)
CREATININE: 5.17 mg/dL — AB (ref 0.50–1.10)
Calcium: 8 mg/dL — ABNORMAL LOW (ref 8.4–10.5)
GFR calc Af Amer: 8 mL/min — ABNORMAL LOW (ref >90)
GFR calc non Af Amer: 7 mL/min — ABNORMAL LOW (ref >90)
Glucose, Bld: 59 mg/dL — ABNORMAL LOW (ref 70–99)
Potassium: 4.1 mmol/L (ref 3.5–5.1)
Sodium: 134 mmol/L — ABNORMAL LOW (ref 135–145)

## 2014-07-05 LAB — I-STAT TROPONIN, ED: Troponin i, poc: 0.01 ng/mL (ref 0.00–0.08)

## 2014-07-05 LAB — CBG MONITORING, ED
GLUCOSE-CAPILLARY: 65 mg/dL — AB (ref 70–99)
GLUCOSE-CAPILLARY: 108 mg/dL — AB (ref 70–99)
Glucose-Capillary: 56 mg/dL — ABNORMAL LOW (ref 70–99)

## 2014-07-05 LAB — BRAIN NATRIURETIC PEPTIDE: B Natriuretic Peptide: 403 pg/mL — ABNORMAL HIGH (ref 0.0–100.0)

## 2014-07-05 MED ORDER — ALBUTEROL SULFATE (2.5 MG/3ML) 0.083% IN NEBU
2.5000 mg | INHALATION_SOLUTION | RESPIRATORY_TRACT | Status: DC | PRN
  Administered 2014-07-05 – 2014-07-06 (×2): 2.5 mg via RESPIRATORY_TRACT
  Filled 2014-07-05 (×2): qty 3

## 2014-07-05 MED ORDER — WARFARIN SODIUM 4 MG PO TABS: 4.0000 mg | ORAL_TABLET | Freq: Every evening | ORAL | Status: DC

## 2014-07-05 MED ORDER — ONDANSETRON HCL 4 MG PO TABS: 4.0000 mg | ORAL_TABLET | Freq: Four times a day (QID) | ORAL | Status: DC | PRN

## 2014-07-05 MED ORDER — NEPRO/CARBSTEADY PO LIQD
237.0000 mL | Freq: Two times a day (BID) | ORAL | Status: DC
  Administered 2014-07-06 – 2014-07-07 (×2): 237 mL via ORAL
  Filled 2014-07-05 (×4): qty 237

## 2014-07-05 MED ORDER — SODIUM CHLORIDE 0.9 % IV SOLN: 250.0000 mL | INTRAVENOUS | Status: DC | PRN

## 2014-07-05 MED ORDER — SODIUM CHLORIDE 0.9 % IJ SOLN
3.0000 mL | Freq: Two times a day (BID) | INTRAMUSCULAR | Status: DC
  Administered 2014-07-06 – 2014-07-08 (×5): 3 mL via INTRAVENOUS

## 2014-07-05 MED ORDER — ALBUTEROL SULFATE (2.5 MG/3ML) 0.083% IN NEBU: 2.5000 mg | INHALATION_SOLUTION | Freq: Four times a day (QID) | RESPIRATORY_TRACT | Status: DC

## 2014-07-05 MED ORDER — ONDANSETRON HCL 4 MG/2ML IJ SOLN: 4.0000 mg | Freq: Three times a day (TID) | INTRAMUSCULAR | Status: DC | PRN

## 2014-07-05 MED ORDER — HYDROMORPHONE HCL 1 MG/ML IJ SOLN: 0.5000 mg | INTRAMUSCULAR | Status: DC | PRN

## 2014-07-05 MED ORDER — ALBUTEROL SULFATE (2.5 MG/3ML) 0.083% IN NEBU: 2.5000 mg | INHALATION_SOLUTION | RESPIRATORY_TRACT | Status: DC | PRN

## 2014-07-05 MED ORDER — MIDODRINE HCL 5 MG PO TABS
10.0000 mg | ORAL_TABLET | Freq: Three times a day (TID) | ORAL | Status: DC
  Administered 2014-07-06 – 2014-07-09 (×8): 10 mg via ORAL
  Filled 2014-07-05 (×14): qty 2

## 2014-07-05 MED ORDER — LORAZEPAM 0.5 MG PO TABS: 0.5000 mg | ORAL_TABLET | Freq: Two times a day (BID) | ORAL | Status: DC | PRN

## 2014-07-05 MED ORDER — ACETAMINOPHEN 650 MG RE SUPP: 650.0000 mg | Freq: Four times a day (QID) | RECTAL | Status: DC | PRN

## 2014-07-05 MED ORDER — RENA-VITE PO TABS
1.0000 | ORAL_TABLET | Freq: Every day | ORAL | Status: DC
  Administered 2014-07-07 – 2014-07-08 (×2): 1 via ORAL
  Filled 2014-07-05 (×4): qty 1

## 2014-07-05 MED ORDER — DEXTROSE 5 % IV SOLN
1.0000 g | INTRAVENOUS | Status: DC
  Administered 2014-07-06 – 2014-07-07 (×3): 1 g via INTRAVENOUS
  Filled 2014-07-05 (×4): qty 10

## 2014-07-05 MED ORDER — DEXTROSE 5 % IV SOLN
500.0000 mg | INTRAVENOUS | Status: DC
  Administered 2014-07-06 – 2014-07-07 (×3): 500 mg via INTRAVENOUS
  Filled 2014-07-05 (×4): qty 500

## 2014-07-05 MED ORDER — SODIUM CHLORIDE 0.9 % IJ SOLN: 3.0000 mL | INTRAMUSCULAR | Status: DC | PRN

## 2014-07-05 MED ORDER — IPRATROPIUM BROMIDE 0.02 % IN SOLN
0.5000 mg | Freq: Once | RESPIRATORY_TRACT | Status: AC
  Administered 2014-07-05: 0.5 mg via RESPIRATORY_TRACT
  Filled 2014-07-05: qty 2.5

## 2014-07-05 MED ORDER — ALBUTEROL SULFATE (2.5 MG/3ML) 0.083% IN NEBU
5.0000 mg | INHALATION_SOLUTION | Freq: Once | RESPIRATORY_TRACT | Status: AC
  Administered 2014-07-05: 5 mg via RESPIRATORY_TRACT
  Filled 2014-07-05: qty 6

## 2014-07-05 MED ORDER — SODIUM CHLORIDE 0.9 % IV BOLUS (SEPSIS)
250.0000 mL | Freq: Once | INTRAVENOUS | Status: AC
  Administered 2014-07-06: 250 mL via INTRAVENOUS

## 2014-07-05 MED ORDER — OXYCODONE HCL 5 MG PO TABS
5.0000 mg | ORAL_TABLET | ORAL | Status: DC | PRN
  Administered 2014-07-06: 5 mg via ORAL
  Filled 2014-07-05 (×2): qty 1

## 2014-07-05 MED ORDER — ONDANSETRON HCL 4 MG/2ML IJ SOLN: 4.0000 mg | Freq: Four times a day (QID) | INTRAMUSCULAR | Status: DC | PRN

## 2014-07-05 MED ORDER — CINACALCET HCL 30 MG PO TABS
30.0000 mg | ORAL_TABLET | Freq: Every day | ORAL | Status: DC
  Administered 2014-07-06 – 2014-07-09 (×3): 30 mg via ORAL
  Filled 2014-07-05 (×6): qty 1

## 2014-07-05 MED ORDER — ACETAMINOPHEN 325 MG PO TABS: 650.0000 mg | ORAL_TABLET | Freq: Four times a day (QID) | ORAL | Status: DC | PRN

## 2014-07-05 NOTE — H&P (Addendum)
[facility] Admission History and Physical       [name] JXB:[id number] DOB: 05-21-1933 DOA: 07/05/2014  Referring physician: EDP PCP: Dorrene GermanAVBUERE,[name], MD  Specialists:   Chief Complaint: SOB and Cough  HPI: [name] is a 80 y.o. female with ESRD on HD who presents to the ED with complaints of SOB , and DOE and malaise and weakness along with subjective Fevers and Chills and productive Cough of greenish sputum x 2-3 days.  She has had poor intake of foods and liquids for the past 3 days, but no nausea and vomiting.    She  Has dialysis treatments on Tues, Thursdays, and Saturdays Regularly, and is compliant with the schedule.   Her Dialysis Schedule was switched this week due to the holiday schedule and dialysis is for Sunday (tomorrow) instead of Saturday today.      Review of Systems:  Constitutional: No Weight Loss, No Weight Gain, Night Sweats, +Fevers, Chills, Dizziness, Fatigue, or +Generalized Weakness HEENT: No Headaches, Difficulty Swallowing,Tooth/Dental Problems,Sore Throat,  No Sneezing, Rhinitis, Ear Ache, Nasal Congestion, or Post Nasal Drip,  Cardio-vascular:  No Chest pain, Orthopnea, PND, Edema in Lower Extremities, Anasarca, Dizziness, Palpitations  Resp: +Dyspnea, +DOE, +Productive Cough, No Non-Productive Cough, No Hemoptysis, No Wheezing.    GI: No Heartburn, Indigestion, Abdominal Pain, Nausea, Vomiting, Diarrhea, Hematemesis, Hematochezia, Melena, Change in Bowel Habits,  +Loss of Appetite  GU: No Dysuria, Change in Color of Urine, No Urgency or Frequency, No Flank pain.  Musculoskeletal: No Joint Pain or Swelling, No Decreased Range of Motion, No Back Pain.  Neurologic: No Syncope, No Seizures, Muscle Weakness, Paresthesia, Vision Disturbance or Loss, No Diplopia, No Vertigo, No Difficulty Walking,  Skin: No Rash or Lesions. Psych: No Change in Mood or Affect, No Depression or Anxiety, No Memory loss, No Confusion, or  Hallucinations   Past Medical History  Diagnosis Date  . Gout   . CKD (chronic kidney disease) stage V requiring chronic dialysis   . Hypertension   . Hyperlipemia       Past Surgical History  Procedure Laterality Date  . Tubal ligation    . Av fistula placement         Prior to Admission medications   Medication Sig Start Date End Date Taking? Authorizing Provider  albuterol (PROAIR HFA) 108 (90 BASE) MCG/ACT inhaler Inhale 1 puff into the lungs every 6 (six) hours as needed for wheezing or shortness of breath.    Yes Historical Provider, MD  cinacalcet (SENSIPAR) 30 MG tablet Take 1 tablet (30 mg total) by mouth daily with breakfast. 06/09/14  Yes Stephani PoliceMarianne L York, PA-C  HYDROcodone-acetaminophen (NORCO/VICODIN) 5-325 MG per tablet Take 1-2 tablets by mouth every 4 (four) hours as needed for moderate pain or severe pain. 05/10/14  Yes [name], MD  LORazepam (ATIVAN) 0.5 MG tablet Take 1 tablet (0.5 mg total) by mouth every 12 (twelve) hours as needed for anxiety. Also-may give 1 hour prior to dialysis 06/23/14  Yes Rhetta MuraJai-Gurmukh Samtani, MD  midodrine (PROAMATINE) 10 MG tablet Take 1 tablet (10 mg total) by mouth 3 (three) times daily with meals. 06/09/14  Yes Marianne L York, PA-C  multivitamin (RENA-VIT) TABS tablet Take 1 tablet by mouth daily.   Yes Historical Provider, MD  nitroGLYCERIN (NITROSTAT) 0.4 MG SL tablet Place 0.4 mg under the tongue every 5 (five) minutes as needed for chest pain.   Yes Historical Provider, MD  Nutritional Supplements (FEEDING SUPPLEMENT, NEPRO CARB STEADY,) LIQD  Take 237 mLs by mouth 2 (two) times daily between meals. 06/09/14  Yes Tora KindredMarianne L York, PA-C  traMADol (ULTRAM) 50 MG tablet Take 1 tablet (50 mg total) by mouth every 6 (six) hours as needed for moderate pain. 06/23/14  Yes Rhetta MuraJai-Gurmukh Samtani, MD  warfarin (COUMADIN) 4 MG tablet Take 4 mg by mouth every evening.  06/30/14  Yes Historical Provider, MD  warfarin (COUMADIN) 5 MG tablet  Take 1 tablet (5 mg total) by mouth daily at 6 PM. Patient not taking: Reported on 07/02/2014 06/09/14   Stephani PoliceMarianne L York, PA-C      Allergies  Allergen Reactions  . Nsaids     REACTION: Avoid NSAIDS(renal failure)     Social History:  reports that she has never smoked. She has never used smokeless tobacco. She reports that she does not drink alcohol or use illicit drugs.     Family History  Problem Relation Age of Onset  . Colon cancer Neg Hx   . Diabetes Mellitus II Neg Hx        Physical Exam:  GEN:  Pleasant Elderly CAchectic 80 y.o. African American female  examined  and in no acute distress; cooperative with exam Filed Vitals:   07/05/14 2010 07/05/14 2054 07/05/14 2126 07/05/14 2240  BP: 96/57 87/44 70/31  91/51  Pulse: 104 105 103 100  Temp:      TempSrc:      Resp: 22 22 18 17   SpO2: 99% 94% 99% 97%   Blood pressure 91/51, pulse 100, temperature 97.8 F (36.6 C), temperature source Oral, resp. rate 17, SpO2 97 %. PSYCH: She is alert and oriented x 2; does not appear anxious does not appear depressed; affect is normal HEENT: Normocephalic and Atraumatic, Mucous membranes pink; PERRLA; EOM intact; Fundi:  Benign;  No scleral icterus, Nares: Patent, Oropharynx: Clear, Edentulous,    Neck:  FROM, No Cervical Lymphadenopathy nor Thyromegaly or Carotid Bruit; No JVD; Breasts:: Not examined CHEST WALL: No tenderness CHEST: Normal respiration, clear to auscultation bilaterally HEART: Regular rate and rhythm; no murmurs rubs or gallops BACK: No kyphosis or scoliosis; No CVA tenderness ABDOMEN: Positive Bowel Sounds, Scaphoid, Soft Non-Tender; No Masses, No Organomegaly. Rectal Exam: Not done EXTREMITIES: No Cyanosis, Clubbing, or Edema; No Ulcerations. Genitalia: not examined PULSES: 2+ and symmetric SKIN: Normal hydration no rash or ulceration CNS:  A x O x 2, Weakness and NonFocal Vascular: pulses palpable throughout    Labs on Admission:  Basic Metabolic  Panel:  Recent Labs Lab 07/02/14 1815 07/05/14 1807  NA 136 134*  K 3.6 4.1  CL 99 97  CO2 22 24  GLUCOSE 112* 59*  BUN 40* 39*  CREATININE 5.26* 5.17*  CALCIUM 8.3* 8.0*   Liver Function Tests:  Recent Labs Lab 07/02/14 1815  AST 26  ALT 13  ALKPHOS 71  BILITOT 1.1  PROT 5.8*  ALBUMIN 2.3*   No results for input(s): LIPASE, AMYLASE in the last 168 hours. No results for input(s): AMMONIA in the last 168 hours. CBC:  Recent Labs Lab 07/02/14 1815 07/05/14 1807  WBC 14.1* 18.4*  NEUTROABS 12.2*  --   HGB 10.0* 10.1*  HCT 31.5* 32.4*  MCV 100.6* 97.6  PLT 177 257   Cardiac Enzymes: No results for input(s): CKTOTAL, CKMB, CKMBINDEX, TROPONINI in the last 168 hours.  BNP (last 3 results) No results for input(s): PROBNP in the last 8760 hours. CBG:  Recent Labs Lab 07/05/14 1958 07/05/14 2132 07/05/14 2238  GLUCAP 56*  65* 108*    Radiological Exams on Admission: Dg Chest Port 1 View  07/05/2014   CLINICAL DATA:  80 year old female with shortness of Breath. Initial encounter.  EXAM: PORTABLE CHEST - 1 VIEW  COMPARISON:  07/02/2014 and earlier.  FINDINGS: Portable AP semi upright view at at 2018 hrs. Stable right chest tunneled dual lumen dialysis catheter. Stable cardiac size and mediastinal contours. No pneumothorax or pulmonary edema. Interval increased left lung base opacity, obscuring most of the left hemidiaphragm.  IMPRESSION: Increased left lung base opacity felt in part due to pleural effusion, with interval lower lobe collapse or consolidation possible. No acute pulmonary edema.   Electronically Signed   By: Augusto Gamble M.D.   On: 07/05/2014 20:33        Assessment/Plan:   80 y.o. female with  Principal Problem:   1.   Sepsis/Septic Shock    Blood Cultures x2 ordered and Lactic Acid Ordered    IV Rocephin and Azithro ordered     Active Problems:   2.   Hypotension   Fluid Challenge x 1   Abxs in #1   Monitor BPs   Check Cortisol level,  and TSH,   Continue Midodrine Rx       3.   Sinus tachycardia   IV Fluid Challenge,   Sepsis Workup and ABxs   Check TSH     4.   Pleural effusion on left   CT Scan of Chest to further Delineate     5.   Productive cough- Clinical Pneumonia and Sepsis   Mucinex BID     6.   Hyperlipidemia   Continue      7.   ESRD on dialysis   On a T, Th, Sat Schedule regularly and is compliant     ( Due to [facility] Dialysis Sunday ( tomorrow) instead of Saturday )     8.   Protein-calorie malnutrition, severe   Nutriton consult   Continue Nepro supplements     9.   Anemia of renal disease   Monitor Hb   10.   Leukocytosis- WBCs =18.4     Monitor Trend    11.  DVT Prophylaxis   On Warfarin Rx 4 mg daily   Monitor PT/INRs   Adjust PRN      Code Status:    FULL CODE   Family Communication:  Daughters at Bedside   Disposition Plan:       Inpatient Stepdown Bed  Time spent:  60 Minutes  Ron Parker [facility] Pager [phone]   If 7AM -7PM Please Contact the Day Rounding Team MD for [facility]  If 7PM-7AM, Please Contact Night-Floor Coverage  [email or web address] Password [email or web address] 07/05/2014, 10:52 PM     ADDENDUM:  Patient was seen and examined on 07/05/2014

## 2014-07-05 NOTE — ED Notes (Signed)
CBG taken =  56

## 2014-07-05 NOTE — ED Notes (Signed)
The pt has not coughed for awhile/.  More comfortable now

## 2014-07-05 NOTE — ED Notes (Signed)
The pt is sl confused.  The daughter at the bedside reports that the pt has not slept all night for 3-4 weeks.  The pt has a bruise to the lt upper arm and down to the elbow from dialysis needle being too small according to the daughter that was why the dialysis cath was placed in her rt upper chest

## 2014-07-05 NOTE — ED Notes (Signed)
Pt asking for food.  Given  Iv nss connected to iv tko.

## 2014-07-05 NOTE — ED Provider Notes (Signed)
CSN: [id number]     Arrival date & time 07/05/14  1736 History   First MD Initiated Contact with Patient 07/05/14 1808     Chief Complaint  Patient presents with  . Shortness of Breath     HPI Pt and family reports pt having last dialysis tx on Thursday. Pt having generalized fatigue, weakness, not eating, coughing and having sob/cp. ekg being done at triage.  No known fever or chills.  No history of COPD.  Does have history of renal failure.  Normally on Tuesday Thursday Saturday schedule but because the holidays did not have dialysis today. Past Medical History  Diagnosis Date  . Gout   . Renal insufficiency   . Hypertension   . Hyperlipemia    Past Surgical History  Procedure Laterality Date  . Tubal ligation    . Av fistula placement     Family History  Problem Relation Age of Onset  . Colon cancer Neg Hx   . Diabetes Mellitus II Neg Hx    History  Substance Use Topics  . Smoking status: Never Smoker   . Smokeless tobacco: Never Used  . Alcohol Use: No   OB History    No data available     Review of Systems  All other systems reviewed and are negative  Allergies  Nsaids  Home Medications   Prior to Admission medications   Medication Sig Start Date End Date Taking? Authorizing Provider  albuterol (PROAIR HFA) 108 (90 BASE) MCG/ACT inhaler Inhale 1 puff into the lungs every 6 (six) hours as needed for wheezing or shortness of breath.     Historical Provider, MD  allopurinol (ZYLOPRIM) 100 MG tablet Take 100 mg by mouth daily.  06/20/14   Historical Provider, MD  amLODipine (NORVASC) 5 MG tablet Take 5 mg by mouth daily.  06/29/14   Historical Provider, MD  cinacalcet (SENSIPAR) 30 MG tablet Take 1 tablet (30 mg total) by mouth daily with breakfast. Patient not taking: Reported on 07/02/2014 06/09/14   Stephani PoliceMarianne L York, PA-C  HYDROcodone-acetaminophen (NORCO/VICODIN) 5-325 MG per tablet Take 1-2 tablets by mouth every 4 (four) hours as needed for moderate pain or  severe pain. 05/10/14   [name], MD  LORazepam (ATIVAN) 0.5 MG tablet Take 1 tablet (0.5 mg total) by mouth every 12 (twelve) hours as needed for anxiety. Also-may give 1 hour prior to dialysis 06/23/14   Rhetta MuraJai-Gurmukh Samtani, MD  midodrine (PROAMATINE) 10 MG tablet Take 1 tablet (10 mg total) by mouth 3 (three) times daily with meals. 06/09/14   Stephani PoliceMarianne L York, PA-C  multivitamin (RENA-VIT) TABS tablet Take 1 tablet by mouth daily.    Historical Provider, MD  nitroGLYCERIN (NITROSTAT) 0.4 MG SL tablet Place 0.4 mg under the tongue every 5 (five) minutes as needed for chest pain.    Historical Provider, MD  Nutritional Supplements (FEEDING SUPPLEMENT, NEPRO CARB STEADY,) LIQD Take 237 mLs by mouth 2 (two) times daily between meals. 06/09/14   Tora KindredMarianne L York, PA-C  RENVELA 800 MG tablet Take 800 mg by mouth daily.  07/02/14   Historical Provider, MD  traMADol (ULTRAM) 50 MG tablet Take 1 tablet (50 mg total) by mouth every 6 (six) hours as needed for moderate pain. 06/23/14   Rhetta MuraJai-Gurmukh Samtani, MD  warfarin (COUMADIN) 4 MG tablet Take 4 mg by mouth daily.  06/30/14   Historical Provider, MD  warfarin (COUMADIN) 5 MG tablet Take 1 tablet (5 mg total) by mouth daily at 6  PM. Patient not taking: Reported on 07/02/2014 06/09/14   Clerance LavMarianne L York, PA-C   BP 70/31 mmHg  Pulse 103  Temp(Src) 97.8 F (36.6 C) (Oral)  Resp 18  SpO2 99% Physical Exam Physical Exam  Nursing note and vitals reviewed. Constitutional: She is oriented to person, place, and time. She appears well-developed and well-nourished. No distress.  Patient with productive cough with yellow thick sputum.   HENT:  Head: Normocephalic and atraumatic.  Eyes: Pupils are equal, round, and reactive to light.  Neck: Normal range of motion.  Cardiovascular: Normal rate and intact distal pulses.   Pulmonary/Chest: No respiratory distress.  decreased breath sounds to auscultation left lung.  Wheezes bilaterally. Abdominal: Normal  appearance. She exhibits no distension.  Musculoskeletal: Normal range of motion.  Neurological: She is alert and oriented to person, place, and time. No cranial nerve deficit.  Skin: Skin is warm and dry. No rash noted.  Psychiatric: She has a normal mood and affect. Her behavior is normal.   ED Course  Procedures (including critical care time) Labs Review Labs Reviewed  CBC - Abnormal; Notable for the following:    WBC 18.4 (*)    RBC 3.32 (*)    Hemoglobin 10.1 (*)    HCT 32.4 (*)    RDW 19.1 (*)    All other components within normal limits  BASIC METABOLIC PANEL - Abnormal; Notable for the following:    Sodium 134 (*)    Glucose, Bld 59 (*)    BUN 39 (*)    Creatinine, Ser 5.17 (*)    Calcium 8.0 (*)    GFR calc non Af Amer 7 (*)    GFR calc Af Amer 8 (*)    All other components within normal limits  BRAIN NATRIURETIC PEPTIDE - Abnormal; Notable for the following:    B Natriuretic Peptide 403.0 (*)    All other components within normal limits  CBG MONITORING, ED - Abnormal; Notable for the following:    Glucose-Capillary 56 (*)    All other components within normal limits  CBG MONITORING, ED - Abnormal; Notable for the following:    Glucose-Capillary 65 (*)    All other components within normal limits  Rosezena SensorI-STAT TROPOININ, ED    Imaging Review Dg Chest Port 1 View  07/05/2014   CLINICAL DATA:  80 year old female with shortness of Breath. Initial encounter.  EXAM: PORTABLE CHEST - 1 VIEW  COMPARISON:  07/02/2014 and earlier.  FINDINGS: Portable AP semi upright view at at 2018 hrs. Stable right chest tunneled dual lumen dialysis catheter. Stable cardiac size and mediastinal contours. No pneumothorax or pulmonary edema. Interval increased left lung base opacity, obscuring most of the left hemidiaphragm.  IMPRESSION: Increased left lung base opacity felt in part due to pleural effusion, with interval lower lobe collapse or consolidation possible. No acute pulmonary edema.    Electronically Signed   By: Augusto GambleLee  Hall M.D.   On: 07/05/2014 20:33     EKG Interpretation   Date/Time:  Saturday July 05 2014 17:54:50 EST Ventricular Rate:  106 PR Interval:  138 QRS Duration: 94 QT Interval:  382 QTC Calculation: 507 R Axis:   -78 Text Interpretation:  Sinus tachycardia with Premature atrial complexes  Left axis deviation Low voltage QRS Incomplete right bundle branch block  Possible Lateral infarct , age undetermined ST \\T \ T wave abnormality,  consider anterior ischemia Abnormal ECG Confirmed by Berman Grainger  MD, Harini Dearmond  ([id number]) on 07/05/2014 8:09:24 PM  MDM   Final diagnoses:  Pleural effusion  Productive cough  End stage renal disease        Nelia Shi, MD 07/05/14 2202

## 2014-07-05 NOTE — ED Notes (Signed)
CBG taken = 108

## 2014-07-05 NOTE — ED Notes (Signed)
Pt has an airregular pulse ?? New the daughter does not remember anyone mentioning it previously

## 2014-07-05 NOTE — ED Notes (Signed)
Report given to rn on 3300 

## 2014-07-05 NOTE — ED Notes (Signed)
CBG taken =  65

## 2014-07-05 NOTE — ED Notes (Signed)
The pt lives with her daughter and usually walks around but has not felt like walking around for several days.  The pt has been a dialysis pt for 2 months.  No sticks in the lt arm.  She has a dialysis catheter in her rt upper chest.  hhn given

## 2014-07-05 NOTE — ED Notes (Signed)
Pt and family reports pt having last dialysis tx on Thursday. Pt having generalized fatigue, weakness, not eating, coughing and having sob/cp. ekg being done at triage.

## 2014-07-05 NOTE — ED Notes (Signed)
The pt was here this past Wednesday with cough and temp. The daughter reports that the pt is worse almost constant coughing productive cough green sputum.  She has also had an elevated temp.  Pt alert

## 2014-07-06 ENCOUNTER — Inpatient Hospital Stay (HOSPITAL_COMMUNITY): Payer: PRIVATE HEALTH INSURANCE

## 2014-07-06 ENCOUNTER — Encounter (HOSPITAL_COMMUNITY): Payer: Self-pay

## 2014-07-06 DIAGNOSIS — I9589 Other hypotension: Secondary | ICD-10-CM

## 2014-07-06 LAB — GLUCOSE, CAPILLARY
GLUCOSE-CAPILLARY: 76 mg/dL (ref 70–99)
Glucose-Capillary: 101 mg/dL — ABNORMAL HIGH (ref 70–99)
Glucose-Capillary: 79 mg/dL (ref 70–99)

## 2014-07-06 LAB — CBC
HEMATOCRIT: 29.3 % — AB (ref 36.0–46.0)
Hemoglobin: 8.9 g/dL — ABNORMAL LOW (ref 12.0–15.0)
MCH: 29.7 pg (ref 26.0–34.0)
MCHC: 30.4 g/dL (ref 30.0–36.0)
MCV: 97.7 fL (ref 78.0–100.0)
Platelets: 244 10*3/uL (ref 150–400)
RBC: 3 MIL/uL — ABNORMAL LOW (ref 3.87–5.11)
RDW: 19.1 % — ABNORMAL HIGH (ref 11.5–15.5)
WBC: 13.6 10*3/uL — ABNORMAL HIGH (ref 4.0–10.5)

## 2014-07-06 LAB — BASIC METABOLIC PANEL
ANION GAP: 14 (ref 5–15)
BUN: 42 mg/dL — ABNORMAL HIGH (ref 6–23)
CO2: 22 mmol/L (ref 19–32)
CREATININE: 5.19 mg/dL — AB (ref 0.50–1.10)
Calcium: 7.5 mg/dL — ABNORMAL LOW (ref 8.4–10.5)
Chloride: 98 mEq/L (ref 96–112)
GFR calc non Af Amer: 7 mL/min — ABNORMAL LOW (ref >90)
GFR, EST AFRICAN AMERICAN: 8 mL/min — AB (ref >90)
Glucose, Bld: 69 mg/dL — ABNORMAL LOW (ref 70–99)
Potassium: 4 mmol/L (ref 3.5–5.1)
Sodium: 134 mmol/L — ABNORMAL LOW (ref 135–145)

## 2014-07-06 LAB — LACTIC ACID, PLASMA: LACTIC ACID, VENOUS: 1.4 mmol/L (ref 0.5–2.2)

## 2014-07-06 LAB — MRSA PCR SCREENING: MRSA by PCR: NEGATIVE

## 2014-07-06 LAB — INFLUENZA PANEL BY PCR (TYPE A & B)
H1N1FLUPCR: NOT DETECTED
INFLAPCR: NEGATIVE
Influenza B By PCR: NEGATIVE

## 2014-07-06 LAB — HEPATITIS B SURFACE ANTIGEN: Hepatitis B Surface Ag: NEGATIVE

## 2014-07-06 LAB — TSH: TSH: 2.215 u[IU]/mL (ref 0.350–4.500)

## 2014-07-06 LAB — PROTIME-INR
INR: 7.82 — AB (ref 0.00–1.49)
Prothrombin Time: 66.3 seconds — ABNORMAL HIGH (ref 11.6–15.2)

## 2014-07-06 LAB — CORTISOL: Cortisol, Plasma: 25.7 ug/dL

## 2014-07-06 MED ORDER — ALTEPLASE 2 MG IJ SOLR: 2.0000 mg | Freq: Once | INTRAMUSCULAR | Status: AC | PRN

## 2014-07-06 MED ORDER — HEPARIN SODIUM (PORCINE) 1000 UNIT/ML DIALYSIS: 100.0000 [IU]/kg | INTRAMUSCULAR | Status: DC | PRN

## 2014-07-06 MED ORDER — NEPRO/CARBSTEADY PO LIQD: 237.0000 mL | ORAL | Status: DC | PRN

## 2014-07-06 MED ORDER — LIDOCAINE-PRILOCAINE 2.5-2.5 % EX CREA: 1.0000 "application " | TOPICAL_CREAM | CUTANEOUS | Status: DC | PRN

## 2014-07-06 MED ORDER — SODIUM CHLORIDE 0.9 % IV SOLN: 100.0000 mL | INTRAVENOUS | Status: DC | PRN

## 2014-07-06 MED ORDER — ALBUMIN HUMAN 25 % IV SOLN
INTRAVENOUS | Status: AC
  Filled 2014-07-06: qty 50

## 2014-07-06 MED ORDER — HEPARIN SODIUM (PORCINE) 1000 UNIT/ML DIALYSIS
1000.0000 [IU] | INTRAMUSCULAR | Status: DC | PRN
  Filled 2014-07-06: qty 1

## 2014-07-06 MED ORDER — SODIUM CHLORIDE 0.9 % IV BOLUS (SEPSIS)
500.0000 mL | Freq: Once | INTRAVENOUS | Status: AC
  Administered 2014-07-06: 500 mL via INTRAVENOUS

## 2014-07-06 MED ORDER — LIDOCAINE HCL (PF) 1 % IJ SOLN: 5.0000 mL | INTRAMUSCULAR | Status: DC | PRN

## 2014-07-06 MED ORDER — PENTAFLUOROPROP-TETRAFLUOROETH EX AERO
1.0000 | INHALATION_SPRAY | CUTANEOUS | Status: DC | PRN
Start: 2014-07-06 — End: 2014-07-09

## 2014-07-06 MED ORDER — IOHEXOL 350 MG/ML SOLN
100.0000 mL | Freq: Once | INTRAVENOUS | Status: AC | PRN
  Administered 2014-07-06: 100 mL via INTRAVENOUS

## 2014-07-06 MED ORDER — ALBUMIN HUMAN 25 % IV SOLN
12.5000 g | Freq: Once | INTRAVENOUS | Status: AC
  Administered 2014-07-06: 12.5 g via INTRAVENOUS

## 2014-07-06 MED ORDER — ALBUTEROL SULFATE (2.5 MG/3ML) 0.083% IN NEBU
2.5000 mg | INHALATION_SOLUTION | Freq: Three times a day (TID) | RESPIRATORY_TRACT | Status: DC
  Administered 2014-07-06 (×2): 2.5 mg via RESPIRATORY_TRACT
  Filled 2014-07-06 (×3): qty 3

## 2014-07-06 MED ORDER — LORAZEPAM 2 MG/ML IJ SOLN
0.5000 mg | Freq: Once | INTRAMUSCULAR | Status: AC
  Administered 2014-07-06: 0.5 mg via INTRAVENOUS
  Filled 2014-07-06: qty 1

## 2014-07-06 NOTE — Consult Note (Signed)
Reason for Consult: Continuity of ESRD care Referring Physician: Debbe Odea MD [facility])  HPI:  80 year old African-American woman who is relatively new to dialysis (started about one month ago)-ESRD on HD on a TTS schedule ([place]) who presented to the hospital with subjective fevers, chills and a productive cough with greenish sputum for the past 2-3 days. Also reports poor oral intake but without any nausea or vomiting. Denies any chest pain but states that she is sore in her chest wall from coughing. Chest x-ray suggestive of progressive pulmonary infiltrate with associated pleural effusion. No significant pulmonary edema noted-she is close to her dry weight.  Last week, she underwent placement of a right IJ tunneled hemodialysis catheter to rest her left brachiocephalic fistula that is severely infiltrated (done at [facility] by myself). The fistula is very tortuous and had some outflow disease that was treated with angioplasty.  HD prescription: [facility] on a TTS schedule. 4 hours, blood flow 400, dialysate flow 800, 4K/2.25Ca, estimated dry weight 53.5 kg, hemodialysis via right IJ tunneled dialysis catheter (resting severely infiltrated left brachiocephalic fistula). Aranesp 40 g weekly and heparin 5700 units bolus.  Past Medical History  Diagnosis Date  . Gout   . CKD (chronic kidney disease) stage V requiring chronic dialysis   . Hypertension   . Hyperlipemia     Past Surgical History  Procedure Laterality Date  . Tubal ligation    . Av fistula placement      Family History  Problem Relation Age of Onset  . Colon cancer Neg Hx   . Diabetes Mellitus II Neg Hx     Social History:  reports that she has never smoked. She has never used smokeless tobacco. She reports that she does not drink alcohol or use illicit drugs.  Allergies:  Allergies  Allergen Reactions  . Nsaids     REACTION: Avoid NSAIDS(renal failure)    Medications:  Scheduled: . albuterol  2.5 mg  Nebulization TID  . azithromycin  500 mg Intravenous Q24H  . cefTRIAXone (ROCEPHIN)  IV  1 g Intravenous Q24H  . cinacalcet  30 mg Oral Q breakfast  . feeding supplement (NEPRO CARB STEADY)  237 mL Oral BID BM  . midodrine  10 mg Oral TID WC  . multivitamin  1 tablet Oral QHS  . sodium chloride  3 mL Intravenous Q12H    Results for orders placed or performed during the hospital encounter of 07/05/14 (from the past 48 hour(s))  TSH     Status: None   Collection Time: 07/05/14  1:06 AM  Result Value Ref Range   TSH 2.215 0.350 - 4.500 uIU/mL  CBC     Status: Abnormal   Collection Time: 07/05/14  6:07 PM  Result Value Ref Range   WBC 18.4 (H) 4.0 - 10.5 K/uL   RBC 3.32 (L) 3.87 - 5.11 MIL/uL   Hemoglobin 10.1 (L) 12.0 - 15.0 g/dL   HCT 32.4 (L) 36.0 - 46.0 %   MCV 97.6 78.0 - 100.0 fL   MCH 30.4 26.0 - 34.0 pg   MCHC 31.2 30.0 - 36.0 g/dL   RDW 19.1 (H) 11.5 - 15.5 %   Platelets 257 150 - 400 K/uL  Basic metabolic panel     Status: Abnormal   Collection Time: 07/05/14  6:07 PM  Result Value Ref Range   Sodium 134 (L) 135 - 145 mmol/L    Comment: Please note change in reference range.   Potassium 4.1 3.5 -  5.1 mmol/L    Comment: Please note change in reference range.   Chloride 97 96 - 112 mEq/L   CO2 24 19 - 32 mmol/L   Glucose, Bld 59 (L) 70 - 99 mg/dL   BUN 39 (H) 6 - 23 mg/dL   Creatinine, Ser 5.17 (H) 0.50 - 1.10 mg/dL   Calcium 8.0 (L) 8.4 - 10.5 mg/dL   GFR calc non Af Amer 7 (L) >90 mL/min   GFR calc Af Amer 8 (L) >90 mL/min    Comment: (NOTE) The eGFR has been calculated using the CKD EPI equation. This calculation has not been validated in all clinical situations. eGFR's persistently <90 mL/min signify possible Chronic Kidney Disease.    Anion gap 13 5 - 15  BNP (order ONLY if patient complains of dyspnea/SOB AND you have documented it for THIS visit)     Status: Abnormal   Collection Time: 07/05/14  6:07 PM  Result Value Ref Range   B Natriuretic Peptide  403.0 (H) 0.0 - 100.0 pg/mL    Comment: Please note change in reference range.  I-stat troponin, ED (not at [facility])     Status: None   Collection Time: 07/05/14  6:16 PM  Result Value Ref Range   Troponin i, poc 0.01 0.00 - 0.08 ng/mL   Comment 3            Comment: Due to the release kinetics of cTnI, a negative result within the first hours of the onset of symptoms does not rule out myocardial infarction with certainty. If myocardial infarction is still suspected, repeat the test at appropriate intervals.   CBG monitoring, ED     Status: Abnormal   Collection Time: 07/05/14  7:58 PM  Result Value Ref Range   Glucose-Capillary 56 (L) 70 - 99 mg/dL  POC CBG, ED     Status: Abnormal   Collection Time: 07/05/14  9:32 PM  Result Value Ref Range   Glucose-Capillary 65 (L) 70 - 99 mg/dL  CBG monitoring, ED     Status: Abnormal   Collection Time: 07/05/14 10:38 PM  Result Value Ref Range   Glucose-Capillary 108 (H) 70 - 99 mg/dL  Glucose, capillary     Status: None   Collection Time: 07/05/14 11:46 PM  Result Value Ref Range   Glucose-Capillary 79 70 - 99 mg/dL   Comment 1 Notify RN   MRSA PCR Screening     Status: None   Collection Time: 07/06/14 12:11 AM  Result Value Ref Range   MRSA by PCR NEGATIVE NEGATIVE    Comment:        The GeneXpert MRSA Assay (FDA approved for NASAL specimens only), is one component of a comprehensive MRSA colonization surveillance program. It is not intended to diagnose MRSA infection nor to guide or monitor treatment for MRSA infections.   Basic metabolic panel     Status: Abnormal   Collection Time: 07/06/14  1:06 AM  Result Value Ref Range   Sodium 134 (L) 135 - 145 mmol/L    Comment: Please note change in reference range.   Potassium 4.0 3.5 - 5.1 mmol/L    Comment: Please note change in reference range.   Chloride 98 96 - 112 mEq/L   CO2 22 19 - 32 mmol/L   Glucose, Bld 69 (L) 70 - 99 mg/dL   BUN 42 (H) 6 - 23 mg/dL   Creatinine, Ser  5.19 (H) 0.50 - 1.10 mg/dL   Calcium 7.5 (L)  8.4 - 10.5 mg/dL   GFR calc non Af Amer 7 (L) >90 mL/min   GFR calc Af Amer 8 (L) >90 mL/min    Comment: (NOTE) The eGFR has been calculated using the CKD EPI equation. This calculation has not been validated in all clinical situations. eGFR's persistently <90 mL/min signify possible Chronic Kidney Disease.    Anion gap 14 5 - 15  CBC     Status: Abnormal   Collection Time: 07/06/14  1:06 AM  Result Value Ref Range   WBC 13.6 (H) 4.0 - 10.5 K/uL   RBC 3.00 (L) 3.87 - 5.11 MIL/uL   Hemoglobin 8.9 (L) 12.0 - 15.0 g/dL   HCT 29.3 (L) 36.0 - 46.0 %   MCV 97.7 78.0 - 100.0 fL   MCH 29.7 26.0 - 34.0 pg   MCHC 30.4 30.0 - 36.0 g/dL   RDW 19.1 (H) 11.5 - 15.5 %   Platelets 244 150 - 400 K/uL  Lactic acid, plasma     Status: None   Collection Time: 07/06/14  3:29 AM  Result Value Ref Range   Lactic Acid, Venous 1.4 0.5 - 2.2 mmol/L  Glucose, capillary     Status: Abnormal   Collection Time: 07/06/14  3:31 AM  Result Value Ref Range   Glucose-Capillary 101 (H) 70 - 99 mg/dL  Protime-INR     Status: Abnormal   Collection Time: 07/06/14  5:35 AM  Result Value Ref Range   Prothrombin Time 66.3 (H) 11.6 - 15.2 seconds    Comment: REPEATED TO VERIFY   INR 7.82 (HH) 0.00 - 1.49    Comment: RESULTS VERIFIED VIA RECOLLECT REPEATED TO VERIFY CRITICAL RESULT CALLED TO, READ BACK BY AND VERIFIED WITHNikki Dom RN [phone] GREEN R     Dg Chest Port 1 View  07/05/2014   CLINICAL DATA:  80 year old female with shortness of Breath. Initial encounter.  EXAM: PORTABLE CHEST - 1 VIEW  COMPARISON:  07/02/2014 and earlier.  FINDINGS: Portable AP semi upright view at at 2018 hrs. Stable right chest tunneled dual lumen dialysis catheter. Stable cardiac size and mediastinal contours. No pneumothorax or pulmonary edema. Interval increased left lung base opacity, obscuring most of the left hemidiaphragm.  IMPRESSION: Increased left lung base opacity felt  in part due to pleural effusion, with interval lower lobe collapse or consolidation possible. No acute pulmonary edema.   Electronically Signed   By: Lars Pinks M.D.   On: 07/05/2014 20:33    Review of Systems  Constitutional: Positive for fever, chills and malaise/fatigue. Negative for weight loss.  HENT: Negative.   Eyes: Negative.   Respiratory: Positive for cough, sputum production and shortness of breath. Negative for hemoptysis and wheezing.   Cardiovascular: Negative.   Gastrointestinal: Positive for nausea. Negative for abdominal pain and diarrhea.       Decreased appetite  Genitourinary: Negative.   Musculoskeletal: Negative.   Skin: Negative.   Neurological: Positive for weakness. Negative for dizziness, tingling and tremors.  Endo/Heme/Allergies: Negative.   Psychiatric/Behavioral: Positive for memory loss. The patient is nervous/anxious.        H/o dementia   Blood pressure 87/51, pulse 88, temperature 98.5 F (36.9 C), temperature source Oral, resp. rate 25, height 5' 4"  (1.626 m), weight 53.6 kg (118 lb 2.7 oz), SpO2 100 %. Physical Exam  Nursing note and vitals reviewed. Constitutional: She appears well-developed. No distress.  Cachectic appearing  HENT:  Head: Normocephalic and atraumatic.  Nose: Nose normal.  Eyes: Conjunctivae and EOM are normal. Pupils are equal, round, and reactive to light. No scleral icterus.  Neck: Normal range of motion. Neck supple. No JVD present. No thyromegaly present.  Cardiovascular: Normal rate, regular rhythm and normal heart sounds.  Exam reveals no friction rub.   Respiratory: Effort normal. She has wheezes. She has rales.  Right base rales with intermittent expiratory wheezing  GI: Soft. Bowel sounds are normal. She exhibits no distension. There is no tenderness.  Musculoskeletal: She exhibits no edema or tenderness.  Severely infiltrated left upper arm brachiocephalic fistula-currently resting arm. Right IJ tunneled hemodialysis  catheter.  Neurological: She is alert.  Somewhat alert and answers questions with some difficulty. Poorly oriented.  Skin: Skin is warm and dry. No erythema.    Assessment/Plan: 1. Pneumonia: Currently on coverage for community-acquired pneumonia with ceftriaxone and azithromycin-likely to escalate antibiotic coverage to include HCAP organisms after CT scan of the chest is done and resulted. Leukocytosis improving. She has chronic hypotension and remains on midodrine. On bronchodilators when necessary and scheduled. 2. ESRD: No acute indications however, will undertake hemodialysis today for clearance-very close to her dry weight. 3. Anemia: Restart ESA, lower hemoglobin has been likely compounded by her recent acute illness/ESA resistance 4. Hypotension: Chronic and on midodrine 10 mg 3 times a day as an outpatient. Attempt ultrafiltration judiciously at hemodialysis. 5. Metabolic bone disease: Resumed binders and cinacalcet, continue to follow phosphorus levels. 6. Protein calorie malnutrition: Nutritional status additionally compromised by her recent acute illness/infection. Continue supplementation-Nepro.  Lenia Housley K. 07/06/2014, 11:21 AM

## 2014-07-06 NOTE — Progress Notes (Signed)
Patient transferred from ER via stretcher by ER RN. Patient pulling at lines and verbally aggressive/agitated. Once daughter present, patient was cooperative. Patient oriented to person only, however [name], pt's daughter, states that this is normal. Oriented patient and family to unit and room, instructed on callbell and placed at side. Bed alarm on. Updated on POC. Family has patient belongings. Will continue to monitor.

## 2014-07-06 NOTE — Progress Notes (Addendum)
[facility] Progress Note   [name] DouglasMattie M Brickhouse ZOX:[id number] DOB: [birth year] DOA: 07/05/2014 PCP: Dorrene GermanAVBUERE,[name], MD  Brief narrative: [name] is a 80 y.o. female with ESRD on HD who presents to the ED with complaints of SOB , and DOE and malaise and weakness along with subjective Fevers and Chills and productive Cough of greenish sputum x 2-3 days. She has had poor intake of foods and liquids for the past 3 days, but no nausea and vomiting.   Subjective: Patient is nonverbal and is not following commands but eyes are open. Family is at bedside but she is not speaking with them either.  Assessment/Plan: Principal Problem:   Sepsis-   Pleural effusion on left-leukocytosis -Based on symptoms, we are suspecting pneumonia versus acute bronchitis -have ordered CT of the chest to further delineate whether there is a left lower lobe infiltrate-Will order PE study as she never had a CT scan on her last admission and it was assumed that she may have had a PE - note small effusion on chest x-ray -May need to change antibiotic coverage to cover for HCAP- Currently she is on Zithromax and Rocephin  Active Problems:      ESRD on dialysis -Renal to dialyze today -Usually dialyzes Tuesday Thursday Saturday    Hypotension -Continue midodrine    Protein-calorie malnutrition, severe -Continue Nepro carb shakes    Anemia of renal disease -Hemoglobin dropped down to 8.9 from 10.1-follow  Recent DVT-possible PE (11/15) -Coumadin on hold as INR is 7.82-no signs of bleeding currently    Code Status: Full code Family Communication: Daughters at bedside Disposition Plan: To be determined-transfer to med surg DVT prophylaxis: INR supratherapeutic  Consultants: Nephrology  Procedures: None  Antibiotics: Anti-infectives    Start     Dose/Rate Route Frequency Ordered Stop   07/05/14 2245  cefTRIAXone (ROCEPHIN) 1 g in dextrose 5 % 50 mL IVPB     1 g100 mL/hr over 30  Minutes Intravenous Every 24 hours 07/05/14 2243     07/05/14 2245  azithromycin (ZITHROMAX) 500 mg in dextrose 5 % 250 mL IVPB     500 mg250 mL/hr over 60 Minutes Intravenous Every 24 hours 07/05/14 2243           Objective: Filed Weights   07/05/14 2350 07/06/14 0335 07/06/14 1438  Weight: 53.6 kg (118 lb 2.7 oz) 53.6 kg (118 lb 2.7 oz) 56 kg (123 lb 7.3 oz)    Intake/Output Summary (Last 24 hours) at 07/06/14 1510 Last data filed at 07/06/14 [id number]  Gross per 24 hour  Intake   1050 ml  Output     75 ml  Net    975 ml     Vitals Filed Vitals:   07/06/14 0745 07/06/14 0750 07/06/14 1105 07/06/14 1438  BP: 87/51  86/43 88/52  Pulse: 88  92 90  Temp: 98.5 F (36.9 C)  98.2 F (36.8 C) 98 F (36.7 C)  TempSrc: Oral  Oral Oral  Resp: 25  24 22   Height:      Weight:    56 kg (123 lb 7.3 oz)  SpO2: 100% 100% 100% 95%    Exam: General: Eyes open but nonverbal and does not follow commands, No acute respiratory distress Lungs: Difficult to assess as she is not taking deep breaths-no rhonchi or wheezes or crackles heard-not requiring oxygen Cardiovascular: Regular rate and rhythm without murmur gallop or rub normal S1 and S2 Abdomen: Nontender, nondistended, soft, bowel sounds positive, no  rebound, no ascites, no appreciable mass Extremities: No significant cyanosis, clubbing, or edema bilateral lower extremities  Data Reviewed: Basic Metabolic Panel:  Recent Labs Lab 07/02/14 1815 07/05/14 1807 07/06/14 0106  NA 136 134* 134*  K 3.6 4.1 4.0  CL 99 97 98  CO2 22 24 22   GLUCOSE 112* 59* 69*  BUN 40* 39* 42*  CREATININE 5.26* 5.17* 5.19*  CALCIUM 8.3* 8.0* 7.5*   Liver Function Tests:  Recent Labs Lab 07/02/14 1815  AST 26  ALT 13  ALKPHOS 71  BILITOT 1.1  PROT 5.8*  ALBUMIN 2.3*   No results for input(s): LIPASE, AMYLASE in the last 168 hours. No results for input(s): AMMONIA in the last 168 hours. CBC:  Recent Labs Lab 07/02/14 1815 07/05/14 1807  07/06/14 0106  WBC 14.1* 18.4* 13.6*  NEUTROABS 12.2*  --   --   HGB 10.0* 10.1* 8.9*  HCT 31.5* 32.4* 29.3*  MCV 100.6* 97.6 97.7  PLT 177 257 244   Cardiac Enzymes: No results for input(s): CKTOTAL, CKMB, CKMBINDEX, TROPONINI in the last 168 hours. BNP (last 3 results) No results for input(s): PROBNP in the last 8760 hours. CBG:  Recent Labs Lab 07/05/14 2132 07/05/14 2238 07/05/14 2346 07/06/14 0331 07/06/14 1234  GLUCAP 65* 108* 79 101* 76    Recent Results (from the past 240 hour(s))  Culture, blood (routine x 2)     Status: None (Preliminary result)   Collection Time: 07/02/14  6:17 PM  Result Value Ref Range Status   Specimen Description BLOOD HAND RIGHT  Final   Special Requests BOTTLES DRAWN AEROBIC AND ANAEROBIC 5CC  Final   Culture  Setup Time   Final    07/02/2014 22:47 Performed at [facility]    Culture   Final           BLOOD CULTURE RECEIVED NO GROWTH TO DATE CULTURE WILL BE HELD FOR 5 DAYS BEFORE ISSUING A FINAL NEGATIVE REPORT Performed at [facility]    Report Status PENDING  Incomplete  Urine culture     Status: None   Collection Time: 07/02/14  6:28 PM  Result Value Ref Range Status   Specimen Description URINE, RANDOM  Final   Special Requests NONE  Final   Culture  Setup Time   Final    07/03/2014 02:40 Performed at [facility]    Colony Count NO GROWTH Performed at [facility]   Final   Culture NO GROWTH Performed at [facility]   Final   Report Status 07/03/2014 FINAL  Final  Culture, blood (routine x 2)     Status: None (Preliminary result)   Collection Time: 07/02/14  8:10 PM  Result Value Ref Range Status   Specimen Description BLOOD RIGHT ARM  Final   Special Requests BOTTLES DRAWN AEROBIC AND ANAEROBIC  Final   Culture  Setup Time   Final    07/03/2014 01:46 Performed at [facility]    Culture   Final           BLOOD CULTURE RECEIVED NO GROWTH TO DATE CULTURE WILL  BE HELD FOR 5 DAYS BEFORE ISSUING A FINAL NEGATIVE REPORT Performed at [facility]    Report Status PENDING  Incomplete  MRSA PCR Screening     Status: None   Collection Time: 07/06/14 12:11 AM  Result Value Ref Range Status   MRSA by PCR NEGATIVE NEGATIVE Final    Comment:  The GeneXpert MRSA Assay (FDA approved for NASAL specimens only), is one component of a comprehensive MRSA colonization surveillance program. It is not intended to diagnose MRSA infection nor to guide or monitor treatment for MRSA infections.      Studies:  Recent x-ray studies have been reviewed in detail by the Attending Physician  Scheduled Meds:  Scheduled Meds: . albuterol  2.5 mg Nebulization TID  . azithromycin  500 mg Intravenous Q24H  . cefTRIAXone (ROCEPHIN)  IV  1 g Intravenous Q24H  . cinacalcet  30 mg Oral Q breakfast  . feeding supplement (NEPRO CARB STEADY)  237 mL Oral BID BM  . midodrine  10 mg Oral TID WC  . multivitamin  1 tablet Oral QHS  . sodium chloride  3 mL Intravenous Q12H   Continuous Infusions:   Time spent on care of this patient: > 35 minutes   Madonna Flegal, MD 07/06/2014, 3:10 PM  LOS: 1 day   [facility] Office  [phone] Pager - Text Page per [email or web address]  If 7PM-7AM, please contact night-coverage [email or web address]

## 2014-07-06 NOTE — Progress Notes (Signed)
Received a call from CT saying that contrast dye has to be okayed by a nephrologist if a pt has been on dialysis for <6 months. Pt has been on dialysis 2-3 months per family. Notified Dr. Butler Denmarkizwan of this and she said she would get in touch with the nephrologist. Will continue to monitor pt.

## 2014-07-06 NOTE — Progress Notes (Signed)
CRITICAL VALUE ALERT  Critical value received:  INR 7.82  Date of notification:  07/06/14  Time of notification:  0628  Critical value read back:Yes.    Nurse who received alert:  D. Madelin Rearillon, RN  MD notified (1st page):  M. Burnadette PeterLynch, NP  Time of first page:  [phone]  MD notified (2nd page):  Time of second page:  Responding MD:  M. Burnadette PeterLynch, NP  Time MD responded:  [phone]

## 2014-07-06 NOTE — Procedures (Signed)
Patient seen on Hemodialysis. QB 400, UF goal 1L Treatment adjusted as needed.  Zetta BillsJay Hibba Schram MD [facility]. Office # [phone] Pager # [phone] 2:54 PM

## 2014-07-06 NOTE — Progress Notes (Signed)
[facility].

## 2014-07-06 NOTE — Progress Notes (Signed)
Patient ID: [name], female   DOB: 1932/10/16, 80 y.o.   MRN: [id number] Discussed with radiology----Okay for [name] to have a contrasted study/CT scan as she is ESRD.  Zetta BillsJay Willona Phariss MD [facility]. Office # [phone] Pager # [phone] 11:43 AM

## 2014-07-06 NOTE — Progress Notes (Signed)
0250 AM labs returned with low blood glucose 69. Encouraged patient to drink orange juice and eat applesauce. Family at bedside to assist. Follow-up CBG 101.

## 2014-07-07 DIAGNOSIS — E785 Hyperlipidemia, unspecified: Secondary | ICD-10-CM

## 2014-07-07 LAB — PREALBUMIN: PREALBUMIN: 3.7 mg/dL — AB (ref 17.0–34.0)

## 2014-07-07 LAB — CBC
HEMATOCRIT: 27.4 % — AB (ref 36.0–46.0)
Hemoglobin: 8.7 g/dL — ABNORMAL LOW (ref 12.0–15.0)
MCH: 31.8 pg (ref 26.0–34.0)
MCHC: 31.8 g/dL (ref 30.0–36.0)
MCV: 100 fL (ref 78.0–100.0)
Platelets: 241 10*3/uL (ref 150–400)
RBC: 2.74 MIL/uL — ABNORMAL LOW (ref 3.87–5.11)
RDW: 19.1 % — ABNORMAL HIGH (ref 11.5–15.5)
WBC: 10.8 10*3/uL — ABNORMAL HIGH (ref 4.0–10.5)

## 2014-07-07 LAB — PROTIME-INR
INR: 5.76 (ref 0.00–1.49)
Prothrombin Time: 52.3 seconds — ABNORMAL HIGH (ref 11.6–15.2)

## 2014-07-07 MED ORDER — SODIUM CHLORIDE 0.9 % IV BOLUS (SEPSIS)
250.0000 mL | Freq: Once | INTRAVENOUS | Status: AC
  Administered 2014-07-07: 250 mL via INTRAVENOUS

## 2014-07-07 MED ORDER — ALBUTEROL SULFATE (2.5 MG/3ML) 0.083% IN NEBU
2.5000 mg | INHALATION_SOLUTION | RESPIRATORY_TRACT | Status: DC
  Administered 2014-07-07 – 2014-07-09 (×7): 2.5 mg via RESPIRATORY_TRACT
  Filled 2014-07-07 (×8): qty 3

## 2014-07-07 MED ORDER — GUAIFENESIN 100 MG/5ML PO SYRP
200.0000 mg | ORAL_SOLUTION | ORAL | Status: DC
  Administered 2014-07-07 – 2014-07-09 (×7): 200 mg via ORAL
  Filled 2014-07-07 (×12): qty 10

## 2014-07-07 MED ORDER — DARBEPOETIN ALFA 60 MCG/0.3ML IJ SOSY: 60.0000 ug | PREFILLED_SYRINGE | INTRAMUSCULAR | Status: DC

## 2014-07-07 MED ORDER — RENA-VITE PO TABS: 1.0000 | ORAL_TABLET | Freq: Every day | ORAL | Status: DC

## 2014-07-07 NOTE — Progress Notes (Signed)
CRITICAL VALUE ALERT  Critical value received:  INR 5.76  Date of notification:  07/07/2014  Time of notification:  1005  Critical value read back:Yes.    Nurse who received alert:  Theadora RamaKIRKMAN, Darletta Noblett Brooke  MD notified (1st page):  Dr. Gaynelle CageFeliz-Ortiz/M. Bell PharmD  Time of first page:  1007  MD notified (2nd page):  Time of second page:  Responding MD:  M.Bell PharmD  Time MD responded:  1012

## 2014-07-07 NOTE — Progress Notes (Signed)
Subjective:  Awakened, with productive cough, no other complaints  Objective: Vital signs in last 24 hours: Temp:  [97.9 F (36.6 C)-99 F (37.2 C)] 98.6 F (37 C) (12/28 0446) Pulse Rate:  [51-93] 92 (12/28 0446) Resp:  [17-25] 18 (12/28 0446) BP: (70-98)/(39-60) 80/50 mmHg (12/28 0447) SpO2:  [95 %-100 %] 100 % (12/28 0446) Weight:  [54.3 kg (119 lb 11.4 oz)-56 kg (123 lb 7.3 oz)] 54.341 kg (119 lb 12.8 oz) (12/27 2008) Weight change: 2.4 kg (5 lb 4.7 oz)  Intake/Output from previous day: 12/27 0701 - 12/28 0700 In: -  Out: 461  Intake/Output this shift:   Lab Results:  Recent Labs  07/05/14 1807 07/06/14 0106  WBC 18.4* 13.6*  HGB 10.1* 8.9*  HCT 32.4* 29.3*  PLT 257 244   BMET:  Recent Labs  07/05/14 1807 07/06/14 0106  NA 134* 134*  K 4.1 4.0  CL 97 98  CO2 24 22  GLUCOSE 59* 69*  BUN 39* 42*  CREATININE 5.17* 5.19*  CALCIUM 8.0* 7.5*   No results for input(s): PTH in the last 72 hours. Iron Studies: No results for input(s): IRON, TIBC, TRANSFERRIN, FERRITIN in the last 72 hours.  Studies/Results: Ct Angio Chest Pe W/cm &/or Wo Cm  07/06/2014   CLINICAL DATA:  Shortness of breath, cough.  EXAM: CT ANGIOGRAPHY CHEST WITH CONTRAST  TECHNIQUE: Multidetector CT imaging of the chest was performed using the standard protocol during bolus administration of intravenous contrast. Multiplanar CT image reconstructions and MIPs were obtained to evaluate the vascular anatomy.  CONTRAST:  100mL OMNIPAQUE IOHEXOL 350 MG/ML SOLN  COMPARISON:  Chest x-ray 07/05/2014.  FINDINGS: No filling defects in the pulmonary arteries to suggest pulmonary emboli. Heart is mildly enlarged. Aorta is normal caliber.  Layering material noted within the mainstem bronchi bilaterally. Mucous plugging of the low left lower lobe bronchi with left lower lobe atelectasis or consolidation. Small left pleural effusion. No definite obstructing central mass lesion.  Atelectasis noted at the right lung  base posteriorly. Patchy ground-glass opacities noted in the lingula and left upper lobe. No pleural effusion on the right. No mediastinal, hilar, or axillary adenopathy.  Right dialysis catheter is in place. Chest wall soft tissues are unremarkable. Imaging into the upper abdomen shows no acute findings.  Review of the MIP images confirms the above findings.  IMPRESSION: No evidence of pulmonary embolus.  Mucous plugging of the left lower lobe bronchi with associated left lower lobe atelectasis or consolidation/pneumonia. Small left effusion. Layering debris/ mucous in the mainstem bronchi bilaterally.  Cardiomegaly.   Electronically Signed   By: Charlett NoseKevin  Dover M.D.   On: 07/06/2014 23:18   EXAM: General appearance:  Alert, in no apparent distress Resp:  Mild expiratory wheezes, bibasilar rales Cardio:  RRR without murmur or rub GI: + BS, soft and nontender Extremities:  No edema Access:  R IJ catheter, AVF @ LUA infiltrated  HD prescription: [facility] on a TTS schedule. 4 hours, blood flow 400, dialysate flow 800, 4K/2.25Ca, estimated dry weight 53.5 kg, hemodialysis via right IJ tunneled dialysis catheter (resting severely infiltrated left brachiocephalic fistula). Aranesp 40 g weekly and heparin 5700 units bolus.  Assessment/Plan: 1. Pneumonia - CT 12/27 showed LLL consolidation, no PE;  Getting nebs, and ABnebulizer 2. ESRD - HD on TTS @ [facility], K 4.  Next HD tomorrow. 3. HTN/Volume - BP 80/50, Midodrine 10 mg tid; wt 54.3 kg s/p net UF 461 ml yesterday. 4. Anemia - Hgb  8.9, Aranesp 40 mcg on Thurs.  Increase to 60 mcg. 5. Sec HPT - Ca 7.5; Sensipar 30 mg qd, no binders. 6. Nutrition - renal diet, supplements, vitamin 7. Dementia - pleasantly demented, conversant but disoriented and has no idea why she is here 8. Code status - notes from pall care on 06/20/14 say DNR, but pt is listed as full code. Will d/w pall care    LOS: 2 days   LYLES,CHARLES 07/07/2014,7:10 AM  Pt  seen, examined and agree w A/P as above.  Vinson Moselleob Jru Pense MD pager [phone]    cell [phone] 07/07/2014, 12:59 PM

## 2014-07-07 NOTE — Progress Notes (Signed)
Pt refuses to allow blood pressure to be taken and refuses to take medication

## 2014-07-07 NOTE — Progress Notes (Signed)
[facility] PROGRESS NOTE Interim History: 80 y.o. female with ESRD on HD who presents to the ED with complaints of SOB , and DOE and malaise and weakness along with subjective Fevers and Chills and productive Cough of greenish sputum x 2-3 days.   Assessment/Plan: Sepsis/Pleural effusion on left-leukocytosis: - Most likely PNA. - CT of the chest as below, no PE, PNA and mucus plug. - Schedule albuterol, chest physiotherapy. - guaifensin.  ESRD on dialysis: - Renal to dialyze today. - Usually dialyzes Tuesday, Thursday and Saturday.  Hypotension: - Continue midodrine  Protein-calorie malnutrition, severe: - Continue Nepro carb shakes  Anemia of renal disease: - Hemoglobin dropped down to 8.9 from 10.1-follow  Recent DVT-possible PE (11/15) - Cont to hold coumadin INR on admission >7.0, now is trending down. - no signs of overt bleeding.    Code Status: Full code Family Communication: Daughters at bedside Disposition Plan: To be determined-transfer to med surg DVT prophylaxis: INR supratherapeutic   Consultants:  none  Procedures: Ct chest : Mucous plugging of the left lower lobe bronchi with associated left lower lobe atelectasis or consolidation/pneumonia. Small left effusion. Layering debris/ mucous in the mainstem bronchi bilaterally  Antibiotics:  Rocephin and azithro  HPI/Subjective: No complains  Objective: Filed Vitals:   07/07/14 0037 07/07/14 0446 07/07/14 0447 07/07/14 0812  BP: 70/39 88/55 80/50  91/48  Pulse:  92  82  Temp:  98.6 F (37 C)  99.1 F (37.3 C)  TempSrc:  Oral  Oral  Resp:  18  16  Height:      Weight:      SpO2:  100%  98%    Intake/Output Summary (Last 24 hours) at 07/07/14 1156 Last data filed at 07/07/14 0700  Gross per 24 hour  Intake    790 ml  Output    461 ml  Net    329 ml   Filed Weights   07/06/14 1438 07/06/14 1855 07/06/14 2008  Weight: 56 kg (123 lb 7.3 oz) 54.3 kg (119 lb 11.4 oz) 54.341 kg  (119 lb 12.8 oz)    Exam:  General: Alert, awake, oriented x2, in no acute distress.  HEENT: No bruits, no goiter.  Heart: Regular rate and rhythm, without murmurs, rubs, gallops.  Lungs: moderate air movement, wheezing B/L Abdomen: Soft, nontender, nondistended, positive bowel sounds.    Data Reviewed: Basic Metabolic Panel:  Recent Labs Lab 07/02/14 1815 07/05/14 1807 07/06/14 0106  NA 136 134* 134*  K 3.6 4.1 4.0  CL 99 97 98  CO2 22 24 22   GLUCOSE 112* 59* 69*  BUN 40* 39* 42*  CREATININE 5.26* 5.17* 5.19*  CALCIUM 8.3* 8.0* 7.5*   Liver Function Tests:  Recent Labs Lab 07/02/14 1815  AST 26  ALT 13  ALKPHOS 71  BILITOT 1.1  PROT 5.8*  ALBUMIN 2.3*   No results for input(s): LIPASE, AMYLASE in the last 168 hours. No results for input(s): AMMONIA in the last 168 hours. CBC:  Recent Labs Lab 07/02/14 1815 07/05/14 1807 07/06/14 0106 07/07/14 0805  WBC 14.1* 18.4* 13.6* 10.8*  NEUTROABS 12.2*  --   --   --   HGB 10.0* 10.1* 8.9* 8.7*  HCT 31.5* 32.4* 29.3* 27.4*  MCV 100.6* 97.6 97.7 100.0  PLT 177 257 244 241   Cardiac Enzymes: No results for input(s): CKTOTAL, CKMB, CKMBINDEX, TROPONINI in the last 168 hours. BNP (last 3 results) No results for input(s): PROBNP in the last 8760 hours. CBG:  Recent Labs Lab 07/05/14 2132 07/05/14 2238 07/05/14 2346 07/06/14 0331 07/06/14 1234  GLUCAP 65* 108* 79 101* 76    Recent Results (from the past 240 hour(s))  Culture, blood (routine x 2)     Status: None (Preliminary result)   Collection Time: 07/02/14  6:17 PM  Result Value Ref Range Status   Specimen Description BLOOD HAND RIGHT  Final   Special Requests BOTTLES DRAWN AEROBIC AND ANAEROBIC 5CC  Final   Culture  Setup Time   Final    07/02/2014 22:47 Performed at [facility]    Culture   Final           BLOOD CULTURE RECEIVED NO GROWTH TO DATE CULTURE WILL BE HELD FOR 5 DAYS BEFORE ISSUING A FINAL NEGATIVE REPORT Performed at  [facility]    Report Status PENDING  Incomplete  Urine culture     Status: None   Collection Time: 07/02/14  6:28 PM  Result Value Ref Range Status   Specimen Description URINE, RANDOM  Final   Special Requests NONE  Final   Culture  Setup Time   Final    07/03/2014 02:40 Performed at [facility]    Colony Count NO GROWTH Performed at [facility]   Final   Culture NO GROWTH Performed at [facility]   Final   Report Status 07/03/2014 FINAL  Final  Culture, blood (routine x 2)     Status: None (Preliminary result)   Collection Time: 07/02/14  8:10 PM  Result Value Ref Range Status   Specimen Description BLOOD RIGHT ARM  Final   Special Requests BOTTLES DRAWN AEROBIC AND ANAEROBIC 10ML  Final   Culture  Setup Time   Final    07/03/2014 01:46 Performed at [facility]    Culture   Final           BLOOD CULTURE RECEIVED NO GROWTH TO DATE CULTURE WILL BE HELD FOR 5 DAYS BEFORE ISSUING A FINAL NEGATIVE REPORT Performed at [facility]    Report Status PENDING  Incomplete  Blood culture (routine x 2)     Status: None (Preliminary result)   Collection Time: 07/05/14 11:08 PM  Result Value Ref Range Status   Specimen Description BLOOD RIGHT ARM  Final   Special Requests BOTTLES DRAWN AEROBIC AND ANAEROBIC 5CC EA  Final   Culture   Final           BLOOD CULTURE RECEIVED NO GROWTH TO DATE CULTURE WILL BE HELD FOR 5 DAYS BEFORE ISSUING A FINAL NEGATIVE REPORT Performed at [facility]    Report Status PENDING  Incomplete  Blood culture (routine x 2)     Status: None (Preliminary result)   Collection Time: 07/05/14 11:18 PM  Result Value Ref Range Status   Specimen Description BLOOD RIGHT WRIST  Final   Special Requests BOTTLES DRAWN AEROBIC ONLY 3CC  Final   Culture   Final           BLOOD CULTURE RECEIVED NO GROWTH TO DATE CULTURE WILL BE HELD FOR 5 DAYS BEFORE ISSUING A FINAL NEGATIVE REPORT Performed at [facility]    Report Status PENDING  Incomplete  MRSA PCR Screening     Status: None   Collection Time: 07/06/14 12:11 AM  Result Value Ref Range Status   MRSA by PCR NEGATIVE NEGATIVE Final    Comment:        The GeneXpert MRSA  Assay (FDA approved for NASAL specimens only), is one component of a comprehensive MRSA colonization surveillance program. It is not intended to diagnose MRSA infection nor to guide or monitor treatment for MRSA infections.      Studies: Ct Angio Chest Pe W/cm &/or Wo Cm  07/06/2014   CLINICAL DATA:  Shortness of breath, cough.  EXAM: CT ANGIOGRAPHY CHEST WITH CONTRAST  TECHNIQUE: Multidetector CT imaging of the chest was performed using the standard protocol during bolus administration of intravenous contrast. Multiplanar CT image reconstructions and MIPs were obtained to evaluate the vascular anatomy.  CONTRAST:  100mL OMNIPAQUE IOHEXOL 350 MG/ML SOLN  COMPARISON:  Chest x-ray 07/05/2014.  FINDINGS: No filling defects in the pulmonary arteries to suggest pulmonary emboli. Heart is mildly enlarged. Aorta is normal caliber.  Layering material noted within the mainstem bronchi bilaterally. Mucous plugging of the low left lower lobe bronchi with left lower lobe atelectasis or consolidation. Small left pleural effusion. No definite obstructing central mass lesion.  Atelectasis noted at the right lung base posteriorly. Patchy ground-glass opacities noted in the lingula and left upper lobe. No pleural effusion on the right. No mediastinal, hilar, or axillary adenopathy.  Right dialysis catheter is in place. Chest wall soft tissues are unremarkable. Imaging into the upper abdomen shows no acute findings.  Review of the MIP images confirms the above findings.  IMPRESSION: No evidence of pulmonary embolus.  Mucous plugging of the left lower lobe bronchi with associated left lower lobe atelectasis or consolidation/pneumonia. Small left effusion. Layering debris/ mucous in the  mainstem bronchi bilaterally.  Cardiomegaly.   Electronically Signed   By: Charlett NoseKevin  Dover M.D.   On: 07/06/2014 23:18   Dg Chest Port 1 View  07/05/2014   CLINICAL DATA:  80 year old female with shortness of Breath. Initial encounter.  EXAM: PORTABLE CHEST - 1 VIEW  COMPARISON:  07/02/2014 and earlier.  FINDINGS: Portable AP semi upright view at at 2018 hrs. Stable right chest tunneled dual lumen dialysis catheter. Stable cardiac size and mediastinal contours. No pneumothorax or pulmonary edema. Interval increased left lung base opacity, obscuring most of the left hemidiaphragm.  IMPRESSION: Increased left lung base opacity felt in part due to pleural effusion, with interval lower lobe collapse or consolidation possible. No acute pulmonary edema.   Electronically Signed   By: Augusto GambleLee  Hall M.D.   On: 07/05/2014 20:33    Scheduled Meds: . albuterol  2.5 mg Nebulization TID  . azithromycin  500 mg Intravenous Q24H  . cefTRIAXone (ROCEPHIN)  IV  1 g Intravenous Q24H  . cinacalcet  30 mg Oral Q breakfast  . [START ON 07/10/2014] darbepoetin (ARANESP) injection - DIALYSIS  60 mcg Intravenous Q Thu-HD  . feeding supplement (NEPRO CARB STEADY)  237 mL Oral BID BM  . midodrine  10 mg Oral TID WC  . multivitamin  1 tablet Oral QHS  . sodium chloride  3 mL Intravenous Q12H   Continuous Infusions:    Marinda ElkFELIZ ORTIZ, Hennessey Cantrell  [facility] Pager [phone]. If 8PM-8AM, please contact night-coverage at [email or web address], password [email or web address] 07/07/2014, 11:56 AM  LOS: 2 days

## 2014-07-07 NOTE — Progress Notes (Signed)
Pt confused, family member left and pt continually tries to get out of bed.  Pt unsteady.  Moved pt to nurses desk for closer observation.

## 2014-07-07 NOTE — Progress Notes (Deleted)
Subjective:  Awakened, with productive cough, no other complaints  Objective: Vital signs in last 24 hours: Temp:  [97.9 F (36.6 C)-99 F (37.2 C)] 98.6 F (37 C) (12/28 0446) Pulse Rate:  [51-93] 92 (12/28 0446) Resp:  [17-25] 18 (12/28 0446) BP: (70-98)/(39-60) 80/50 mmHg (12/28 0447) SpO2:  [95 %-100 %] 100 % (12/28 0446) Weight:  [54.3 kg (119 lb 11.4 oz)-56 kg (123 lb 7.3 oz)] 54.341 kg (119 lb 12.8 oz) (12/27 2008) Weight change: 2.4 kg (5 lb 4.7 oz)  Intake/Output from previous day: 12/27 0701 - 12/28 0700 In: -  Out: 461  Intake/Output this shift:   Lab Results:  Recent Labs  07/05/14 1807 07/06/14 0106  WBC 18.4* 13.6*  HGB 10.1* 8.9*  HCT 32.4* 29.3*  PLT 257 244   BMET:  Recent Labs  07/05/14 1807 07/06/14 0106  NA 134* 134*  K 4.1 4.0  CL 97 98  CO2 24 22  GLUCOSE 59* 69*  BUN 39* 42*  CREATININE 5.17* 5.19*  CALCIUM 8.0* 7.5*   No results for input(s): PTH in the last 72 hours. Iron Studies: No results for input(s): IRON, TIBC, TRANSFERRIN, FERRITIN in the last 72 hours.  Studies/Results: Ct Angio Chest Pe W/cm &/or Wo Cm  07/06/2014   CLINICAL DATA:  Shortness of breath, cough.  EXAM: CT ANGIOGRAPHY CHEST WITH CONTRAST  TECHNIQUE: Multidetector CT imaging of the chest was performed using the standard protocol during bolus administration of intravenous contrast. Multiplanar CT image reconstructions and MIPs were obtained to evaluate the vascular anatomy.  CONTRAST:  100mL OMNIPAQUE IOHEXOL 350 MG/ML SOLN  COMPARISON:  Chest x-ray 07/05/2014.  FINDINGS: No filling defects in the pulmonary arteries to suggest pulmonary emboli. Heart is mildly enlarged. Aorta is normal caliber.  Layering material noted within the mainstem bronchi bilaterally. Mucous plugging of the low left lower lobe bronchi with left lower lobe atelectasis or consolidation. Small left pleural effusion. No definite obstructing central mass lesion.  Atelectasis noted at the right lung  base posteriorly. Patchy ground-glass opacities noted in the lingula and left upper lobe. No pleural effusion on the right. No mediastinal, hilar, or axillary adenopathy.  Right dialysis catheter is in place. Chest wall soft tissues are unremarkable. Imaging into the upper abdomen shows no acute findings.  Review of the MIP images confirms the above findings.  IMPRESSION: No evidence of pulmonary embolus.  Mucous plugging of the left lower lobe bronchi with associated left lower lobe atelectasis or consolidation/pneumonia. Small left effusion. Layering debris/ mucous in the mainstem bronchi bilaterally.  Cardiomegaly.   Electronically Signed   By: Charlett NoseKevin  Dover M.D.   On: 07/06/2014 23:18   EXAM: General appearance:  Alert, in no apparent distress Resp:  Mild expiratory wheezes, bibasilar rales Cardio:  RRR without murmur or rub GI: + BS, soft and nontender Extremities:  No edema Access:  R IJ catheter, AVF @ LUA infiltrated  HD prescription: [facility] on a TTS schedule. 4 hours, blood flow 400, dialysate flow 800, 4K/2.25Ca, estimated dry weight 53.5 kg, hemodialysis via right IJ tunneled dialysis catheter (resting severely infiltrated left brachiocephalic fistula). Aranesp 40 g weekly and heparin 5700 units bolus.  Assessment/Plan: 1. Pneumonia - CT 12/27 showed LLL consolidation, no PE; on nebulizer, Azithromycin & Ceftriaxone.  2. ESRD - HD on TTS @ [facility], K 4.  Next HD tomorrow. 3. HTN/Volume - BP 80/50, Midodrine 10 mg tid; wt 54.3 kg s/p net UF 461 ml yesterday. 4. Anemia -  Hgb 8.9, Aranesp 40 mcg on Thurs.  Increase to 60 mcg. 5. Sec HPT - Ca 7.5; Sensipar 30 mg qd, no binders. 6. Nutrition - renal diet, supplements, vitamin.   LOS: 2 days   Tecla Mailloux 07/07/2014,7:10 AM

## 2014-07-07 NOTE — Progress Notes (Signed)
INITIAL NUTRITION ASSESSMENT  Pt meets criteria for SEVERE MALNUTRITION in the context of chronic illness as evidenced by severe fat and muscle mass loss.  DOCUMENTATION CODES Per approved criteria  -Severe malnutrition in the context of chronic illness   INTERVENTION: Continue Nepro Shake po BID, each supplement provides 425 kcal and 19 grams protein.  Encourage adequate PO intake.  NUTRITION DIAGNOSIS: Increased nutrient needs related to chronic illness, ESRD as evidenced by estimated nutrition needs.   Goal: Pt to meet >/= 90% of their estimated nutrition needs   Monitor:  PO intake, weight trends, labs, I/O's  Reason for Assessment: MST  80 y.o. female  Admitting Dx: Sepsis  ASSESSMENT: Pt with ESRD on HD who presents to the ED with complaints of SOB , and DOE and malaise and weakness along with subjective Fevers and Chills and productive Cough of greenish sputum x 2-3 days. She has had poor intake of foods and liquids for the past 3 days, but no nausea and vomiting.  Family at pt bedside. Family member reports pt has been eating well at home with 3 full meals a day with Boost BID, except for the 2 days PTA. She reports pt has still been eating 3 meals a day, but appetite has been decreased. Weight has been stable. Pt currently has Nepro Shake ordered and has been drinking them. Family reports she has been having trouble finding Nepro. Family member was educated on where Nepro can be found. Pt was encouraged to eat her food at meals and to drink her supplements.   Labs and medications reviewed.  Nutrition Focused Physical Exam:  Subcutaneous Fat:  Orbital Region: N/A Upper Arm Region: Severe depletion Thoracic and Lumbar Region: WNL  Muscle:  Temple Region: N/A Clavicle Bone Region: Severe depletion Clavicle and Acromion Bone Region: Severe depletion Scapular Bone Region: N/A Dorsal Hand: N/A Patellar Region: WNL Anterior Thigh Region: WNL Posterior Calf Region:  WNL  Edema: non-pitting LUE   Height: Ht Readings from Last 1 Encounters:  07/06/14 5\' 4"  (1.626 m)    Weight: Wt Readings from Last 1 Encounters:  07/06/14 119 lb 12.8 oz (54.341 kg)    Ideal Body Weight: 120 lbs  % Ideal Body Weight: 99%  Wt Readings from Last 10 Encounters:  07/06/14 119 lb 12.8 oz (54.341 kg)  06/24/14 113 lb (51.256 kg)  06/22/14 117 lb 12.8 oz (53.434 kg)  06/09/14 122 lb 9.2 oz (55.6 kg)  04/10/14 125 lb (56.7 kg)  05/07/13 151 lb (68.493 kg)  03/13/13 154 lb (69.854 kg)  11/14/11 180 lb (81.647 kg)  11/08/11 180 lb (81.647 kg)  09/08/08 208 lb (94.348 kg)    Usual Body Weight: 154 lbs  % Usual Body Weight: 77%  BMI:  Body mass index is 20.55 kg/(m^2).  Estimated Nutritional Needs: Kcal: 1700-1900 Protein: 80-95 grams Fluid: 1.2 L/day  Skin: non-pitting LUE  Diet Order: Diet renal W/122100mL fluid restriction  EDUCATION NEEDS: -Education needs addressed   Intake/Output Summary (Last 24 hours) at 07/07/14 1014 Last data filed at 07/07/14 0700  Gross per 24 hour  Intake    790 ml  Output    461 ml  Net    329 ml    Last BM: 12/27  Labs:   Recent Labs Lab 07/02/14 1815 07/05/14 1807 07/06/14 0106  NA 136 134* 134*  K 3.6 4.1 4.0  CL 99 97 98  CO2 22 24 22   BUN 40* 39* 42*  CREATININE 5.26* 5.17* 5.19*  CALCIUM 8.3* 8.0* 7.5*  GLUCOSE 112* 59* 69*    CBG (last 3)   Recent Labs  07/05/14 2346 07/06/14 0331 07/06/14 1234  GLUCAP 79 101* 76    Scheduled Meds: . albuterol  2.5 mg Nebulization TID  . azithromycin  500 mg Intravenous Q24H  . cefTRIAXone (ROCEPHIN)  IV  1 g Intravenous Q24H  . cinacalcet  30 mg Oral Q breakfast  . [START ON 07/10/2014] darbepoetin (ARANESP) injection - DIALYSIS  60 mcg Intravenous Q Thu-HD  . feeding supplement (NEPRO CARB STEADY)  237 mL Oral BID BM  . midodrine  10 mg Oral TID WC  . multivitamin  1 tablet Oral QHS  . sodium chloride  3 mL Intravenous Q12H    Continuous  Infusions:   Past Medical History  Diagnosis Date  . Gout   . CKD (chronic kidney disease) stage V requiring chronic dialysis   . Hypertension   . Hyperlipemia     Past Surgical History  Procedure Laterality Date  . Tubal ligation    . Av fistula placement      Marijean NiemannStephanie La, MS, RD, LDN Pager # [phone] After hours/ weekend pager # [phone]

## 2014-07-08 DIAGNOSIS — Z515 Encounter for palliative care: Secondary | ICD-10-CM

## 2014-07-08 DIAGNOSIS — B370 Candidal stomatitis: Secondary | ICD-10-CM

## 2014-07-08 LAB — RENAL FUNCTION PANEL
Albumin: 1.8 g/dL — ABNORMAL LOW (ref 3.5–5.2)
Anion gap: 8 (ref 5–15)
BUN: 13 mg/dL (ref 6–23)
CALCIUM: 7.9 mg/dL — AB (ref 8.4–10.5)
CO2: 26 mmol/L (ref 19–32)
CREATININE: 3.14 mg/dL — AB (ref 0.50–1.10)
Chloride: 100 mEq/L (ref 96–112)
GFR calc Af Amer: 15 mL/min — ABNORMAL LOW (ref >90)
GFR, EST NON AFRICAN AMERICAN: 13 mL/min — AB (ref >90)
Glucose, Bld: 60 mg/dL — ABNORMAL LOW (ref 70–99)
Phosphorus: 2.9 mg/dL (ref 2.3–4.6)
Potassium: 3.3 mmol/L — ABNORMAL LOW (ref 3.5–5.1)
Sodium: 134 mmol/L — ABNORMAL LOW (ref 135–145)

## 2014-07-08 LAB — CULTURE, BLOOD (ROUTINE X 2): CULTURE: NO GROWTH

## 2014-07-08 LAB — CBC
HCT: 25.6 % — ABNORMAL LOW (ref 36.0–46.0)
Hemoglobin: 7.9 g/dL — ABNORMAL LOW (ref 12.0–15.0)
MCH: 30.4 pg (ref 26.0–34.0)
MCHC: 30.9 g/dL (ref 30.0–36.0)
MCV: 98.5 fL (ref 78.0–100.0)
PLATELETS: 247 10*3/uL (ref 150–400)
RBC: 2.6 MIL/uL — AB (ref 3.87–5.11)
RDW: 19 % — AB (ref 11.5–15.5)
WBC: 10.7 10*3/uL — ABNORMAL HIGH (ref 4.0–10.5)

## 2014-07-08 LAB — PROTIME-INR
INR: 4.6 — ABNORMAL HIGH (ref 0.00–1.49)
Prothrombin Time: 43.8 s — ABNORMAL HIGH (ref 11.6–15.2)

## 2014-07-08 MED ORDER — MAGIC MOUTHWASH W/LIDOCAINE
5.0000 mL | Freq: Three times a day (TID) | ORAL | Status: DC
  Administered 2014-07-08 – 2014-07-09 (×3): 5 mL via ORAL
  Filled 2014-07-08 (×6): qty 5

## 2014-07-08 MED ORDER — MIDODRINE HCL 5 MG PO TABS
ORAL_TABLET | ORAL | Status: AC
  Filled 2014-07-08: qty 2

## 2014-07-08 MED ORDER — TRAZODONE 25 MG HALF TABLET
25.0000 mg | ORAL_TABLET | Freq: Every day | ORAL | Status: DC
  Administered 2014-07-08: 25 mg via ORAL
  Filled 2014-07-08 (×2): qty 1

## 2014-07-08 MED ORDER — LEVOFLOXACIN 750 MG PO TABS
750.0000 mg | ORAL_TABLET | Freq: Every day | ORAL | Status: DC
  Administered 2014-07-08: 750 mg via ORAL
  Filled 2014-07-08 (×3): qty 1

## 2014-07-08 NOTE — Progress Notes (Signed)
Subjective:  On HD, no complaints  Objective: Vital signs in last 24 hours: Temp:  [97.9 F (36.6 C)-99 F (37.2 C)] 98.6 F (37 C) (12/28 0446) Pulse Rate:  [51-93] 92 (12/28 0446) Resp:  [17-25] 18 (12/28 0446) BP: (70-98)/(39-60) 80/50 mmHg (12/28 0447) SpO2:  [95 %-100 %] 100 % (12/28 0446) Weight:  [54.3 kg (119 lb 11.4 oz)-56 kg (123 lb 7.3 oz)] 54.341 kg (119 lb 12.8 oz) (12/27 2008) Weight change: 2.4 kg (5 lb 4.7 oz)  Intake/Output from previous day: 12/27 0701 - 12/28 0700 In: -  Out: 461  Intake/Output this shift:   Lab Results:  Recent Labs  07/05/14 1807 07/06/14 0106  WBC 18.4* 13.6*  HGB 10.1* 8.9*  HCT 32.4* 29.3*  PLT 257 244   BMET:  Recent Labs  07/05/14 1807 07/06/14 0106  NA 134* 134*  K 4.1 4.0  CL 97 98  CO2 24 22  GLUCOSE 59* 69*  BUN 39* 42*  CREATININE 5.17* 5.19*  CALCIUM 8.0* 7.5*   EXAM: General appearance:  Alert, in no apparent distress Resp:  Mild expiratory wheezes, bibasilar rales Cardio:  RRR without murmur or rub GI: + BS, soft and nontender Extremities:  No edema Access:  R IJ catheter, AVF @ LUA infiltrated  HD: [facility]   400/800  4K/2.25 [place]   53.5kg    R IJ cath / LUA AVF (resting d/t severe infiltration, tortuous course) Aranesp 40 g weekly and heparin 5700 units bolus.  Assessment: 1. Pneumonia - CT 12/27 showed LLL consolidation, no PE;  Getting nebs, and AB 2. ESRD - HD TTS. HD today 3. Vascular access - resting LUA AVF d/t infiltrations; per Dr Allena KatzPatel on fistulogram done at [facility] , the fistula was found to be extremely tortuous. This will make for difficult cannulations and frequent infiltrations.  4. HTN/Volume - BP 80/50, Midodrine 10 mg tid; wt 54.3 kg s/p net UF 461 ml yesterday. 5. Anemia - Hgb 8.9, Aranesp 40 mcg on Thurs.  Increase to 60 mcg. 6. Sec HPT - Ca 7.5; Sensipar 30 mg qd, no binders. 7. Nutrition - renal diet, supplements, vitamin 8. Dementia - pleasantly demented, conversant  but disoriented and has no idea why she is here.  9. Code status - notes from pall care on 06/20/14 say DNR, but pt is listed as full code. Palliative care said they would consult and clarify the code status  Plan - HD today   [name] [name] Hargadon MD (pgr) [phone]    ([id number]) [phone] 07/08/2014, 10:09 AM

## 2014-07-08 NOTE — Progress Notes (Signed)
[facility] PROGRESS NOTE Interim History: 80 y.o. female with ESRD on HD who presents to the ED with complaints of SOB , and DOE and malaise and weakness along with subjective Fevers and Chills and productive Cough of greenish sputum x 2-3 days.   Assessment/Plan: Sepsis/Pleural effusion on left-leukocytosis: - Most likely PNA. De escalate to levaquin - CT of the chest as below, no PE, PNA and mucus plug. - Schedule albuterol, chest physiotherapy. - Started on mucolytic's.  ESRD on dialysis: - Renal to dialyze today. - Usually dialyzes Tuesday, Thursday and Saturday.  Hypotension: - Continue midodrine  Protein-calorie malnutrition, severe: - Continue Nepro carb shakes  Anemia of renal disease: - Hemoglobin dropped down to 8.9 from 10.1-follow  Recent DVT-possible PE (11/15) - Cont to hold coumadin INR on admission >7.0, now is trending down. - no signs of overt bleeding.    Code Status: DNR Family Communication: Daughters at bedside Disposition Plan: To be determined-transfer to med surg DVT prophylaxis: INR supratherapeutic   Consultants:  none  Procedures: Ct chest : Mucous plugging of the left lower lobe bronchi with associated left lower lobe atelectasis or consolidation/pneumonia. Small left effusion. Layering debris/ mucous in the mainstem bronchi bilaterally  Antibiotics:  Rocephin and azithro  HPI/Subjective: No complains  Objective: Filed Vitals:   07/08/14 1000 07/08/14 1030 07/08/14 1107 07/08/14 1130  BP: 107/69 97/56 95/58  108/54  Pulse: 86 82 81   Temp:   98 F (36.7 C)   TempSrc:   Oral   Resp: 14 14 13    Height:      Weight:   52.8 kg (116 lb 6.5 oz)   SpO2:        Intake/Output Summary (Last 24 hours) at 07/08/14 1239 Last data filed at 07/08/14 1107  Gross per 24 hour  Intake    420 ml  Output    820 ml  Net   -400 ml   Filed Weights   07/06/14 2008 07/08/14 0700 07/08/14 1107  Weight: 54.341 kg (119 lb 12.8 oz)  54.4 kg (119 lb 14.9 oz) 52.8 kg (116 lb 6.5 oz)    Exam:  General: Alert, awake, oriented x2, in no acute distress.  HEENT: No bruits, no goiter.  Heart: Regular rate and rhythm, without murmurs, rubs, gallops.  Lungs: moderate air movement, clear Abdomen: Soft, nontender, nondistended, positive bowel sounds.    Data Reviewed: Basic Metabolic Panel:  Recent Labs Lab 07/02/14 1815 07/05/14 1807 07/06/14 0106 07/08/14 0500  NA 136 134* 134* 134*  K 3.6 4.1 4.0 3.3*  CL 99 97 98 100  CO2 22 24 22 26   GLUCOSE 112* 59* 69* 60*  BUN 40* 39* 42* 13  CREATININE 5.26* 5.17* 5.19* 3.14*  CALCIUM 8.3* 8.0* 7.5* 7.9*  PHOS  --   --   --  2.9   Liver Function Tests:  Recent Labs Lab 07/02/14 1815 07/08/14 0500  AST 26  --   ALT 13  --   ALKPHOS 71  --   BILITOT 1.1  --   PROT 5.8*  --   ALBUMIN 2.3* 1.8*   No results for input(s): LIPASE, AMYLASE in the last 168 hours. No results for input(s): AMMONIA in the last 168 hours. CBC:  Recent Labs Lab 07/02/14 1815 07/05/14 1807 07/06/14 0106 07/07/14 0805 07/08/14 0500  WBC 14.1* 18.4* 13.6* 10.8* 10.7*  NEUTROABS 12.2*  --   --   --   --   HGB 10.0* 10.1* 8.9* 8.7*  7.9*  HCT 31.5* 32.4* 29.3* 27.4* 25.6*  MCV 100.6* 97.6 97.7 100.0 98.5  PLT 177 257 244 241 247   Cardiac Enzymes: No results for input(s): CKTOTAL, CKMB, CKMBINDEX, TROPONINI in the last 168 hours. BNP (last 3 results) No results for input(s): PROBNP in the last 8760 hours. CBG:  Recent Labs Lab 07/05/14 2132 07/05/14 2238 07/05/14 2346 07/06/14 0331 07/06/14 1234  GLUCAP 65* 108* 79 101* 76    Recent Results (from the past 240 hour(s))  Culture, blood (routine x 2)     Status: None   Collection Time: 07/02/14  6:17 PM  Result Value Ref Range Status   Specimen Description BLOOD HAND RIGHT  Final   Special Requests BOTTLES DRAWN AEROBIC AND ANAEROBIC 5CC  Final   Culture  Setup Time   Final    07/02/2014 22:47 Performed at [facility]    Culture   Final    NO GROWTH 5 DAYS Performed at [facility]    Report Status 07/08/2014 FINAL  Final  Urine culture     Status: None   Collection Time: 07/02/14  6:28 PM  Result Value Ref Range Status   Specimen Description URINE, RANDOM  Final   Special Requests NONE  Final   Culture  Setup Time   Final    07/03/2014 02:40 Performed at [facility]    Colony Count NO GROWTH Performed at [facility]   Final   Culture NO GROWTH Performed at [facility]   Final   Report Status 07/03/2014 FINAL  Final  Culture, blood (routine x 2)     Status: None (Preliminary result)   Collection Time: 07/02/14  8:10 PM  Result Value Ref Range Status   Specimen Description BLOOD RIGHT ARM  Final   Special Requests BOTTLES DRAWN AEROBIC AND ANAEROBIC  Final   Culture  Setup Time   Final    07/03/2014 01:46 Performed at [facility]    Culture   Final           BLOOD CULTURE RECEIVED NO GROWTH TO DATE CULTURE WILL BE HELD FOR 5 DAYS BEFORE ISSUING A FINAL NEGATIVE REPORT Performed at [facility]    Report Status PENDING  Incomplete  Blood culture (routine x 2)     Status: None (Preliminary result)   Collection Time: 07/05/14 11:08 PM  Result Value Ref Range Status   Specimen Description BLOOD RIGHT ARM  Final   Special Requests BOTTLES DRAWN AEROBIC AND ANAEROBIC 5CC EA  Final   Culture   Final           BLOOD CULTURE RECEIVED NO GROWTH TO DATE CULTURE WILL BE HELD FOR 5 DAYS BEFORE ISSUING A FINAL NEGATIVE REPORT Performed at [facility]    Report Status PENDING  Incomplete  Blood culture (routine x 2)     Status: None (Preliminary result)   Collection Time: 07/05/14 11:18 PM  Result Value Ref Range Status   Specimen Description BLOOD RIGHT WRIST  Final   Special Requests BOTTLES DRAWN AEROBIC ONLY 3CC  Final   Culture   Final           BLOOD CULTURE RECEIVED NO GROWTH TO DATE CULTURE WILL BE HELD FOR 5  DAYS BEFORE ISSUING A FINAL NEGATIVE REPORT Performed at [facility]    Report Status PENDING  Incomplete  MRSA PCR Screening     Status: None   Collection Time:  07/06/14 12:11 AM  Result Value Ref Range Status   MRSA by PCR NEGATIVE NEGATIVE Final    Comment:        The GeneXpert MRSA Assay (FDA approved for NASAL specimens only), is one component of a comprehensive MRSA colonization surveillance program. It is not intended to diagnose MRSA infection nor to guide or monitor treatment for MRSA infections.      Studies: Ct Angio Chest Pe W/cm &/or Wo Cm  07/06/2014   CLINICAL DATA:  Shortness of breath, cough.  EXAM: CT ANGIOGRAPHY CHEST WITH CONTRAST  TECHNIQUE: Multidetector CT imaging of the chest was performed using the standard protocol during bolus administration of intravenous contrast. Multiplanar CT image reconstructions and MIPs were obtained to evaluate the vascular anatomy.  CONTRAST:  100mL OMNIPAQUE IOHEXOL 350 MG/ML SOLN  COMPARISON:  Chest x-ray 07/05/2014.  FINDINGS: No filling defects in the pulmonary arteries to suggest pulmonary emboli. Heart is mildly enlarged. Aorta is normal caliber.  Layering material noted within the mainstem bronchi bilaterally. Mucous plugging of the low left lower lobe bronchi with left lower lobe atelectasis or consolidation. Small left pleural effusion. No definite obstructing central mass lesion.  Atelectasis noted at the right lung base posteriorly. Patchy ground-glass opacities noted in the lingula and left upper lobe. No pleural effusion on the right. No mediastinal, hilar, or axillary adenopathy.  Right dialysis catheter is in place. Chest wall soft tissues are unremarkable. Imaging into the upper abdomen shows no acute findings.  Review of the MIP images confirms the above findings.  IMPRESSION: No evidence of pulmonary embolus.  Mucous plugging of the left lower lobe bronchi with associated left lower lobe atelectasis or  consolidation/pneumonia. Small left effusion. Layering debris/ mucous in the mainstem bronchi bilaterally.  Cardiomegaly.   Electronically Signed   By: Charlett NoseKevin  Dover M.D.   On: 07/06/2014 23:18    Scheduled Meds: . albuterol  2.5 mg Nebulization Q4H  . azithromycin  500 mg Intravenous Q24H  . cefTRIAXone (ROCEPHIN)  IV  1 g Intravenous Q24H  . cinacalcet  30 mg Oral Q breakfast  . [START ON 07/10/2014] darbepoetin (ARANESP) injection - DIALYSIS  60 mcg Intravenous Q Thu-HD  . feeding supplement (NEPRO CARB STEADY)  237 mL Oral BID BM  . guaifenesin  200 mg Oral Q4H while awake  . midodrine  10 mg Oral TID WC  . multivitamin  1 tablet Oral QHS  . sodium chloride  3 mL Intravenous Q12H   Continuous Infusions:    Marinda ElkFELIZ ORTIZ, ABRAHAM  [facility] Pager [phone]. If 8PM-8AM, please contact night-coverage at [email or web address], password [email or web address] 07/08/2014, 12:39 PM  LOS: 3 days

## 2014-07-08 NOTE — Procedures (Signed)
I was present at this dialysis session, have reviewed the session itself and made  appropriate changes  Rob Pellegrino Kennard MD (pgr) [phone]    (c) [phone] 07/08/2014, 9:14 AM   

## 2014-07-08 NOTE — Consult Note (Signed)
Patient [name]      DOB: 06/04/1933      TIR:[id number]     Consult Note from the Palliative Medicine Team at [facility] Requested by: Dr. Melvia Heaps     PCP: Philis Fendt, MD Reason for Consultation: [place]     Phone Number:[phone]  Assessment of patients Current state: I met again today with [name] and her daughter Sharyn Lull. [name] continues to be very confused and asks "why are ya'll here in my house." I have met with them on past admission. Sharyn Lull has told me that she does not want for her mother to be miserable and suffering and considers stopping dialysis but she cannot get herself to this point yet. She continues to want to continue dialysis she tells me. Sharyn Lull says that her mother was in rehab for ~ 1 week and was so agitated and difficult she brought her back home with her. Sharyn Lull says she continues to be awake all night and roaming the house and that they have been sitting with her during her dialysis sessions as we discussed before. I did recommend this to them so they can see the difficulty that this is for [name]. Sharyn Lull says that she does well when she is with her at dialysis but continues to be difficult and agitated with other family members. I continue to offer support and remind Sharyn Lull that when she decides this is too difficult on her mother and too difficult on her family that they are able to d/c dialysis and receive the help of hospice in their home or in their facility. Appetite continues to be very poor and I remind Sharyn Lull that her body will not thrive without nutrition and she will continue to have complications. Sharyn Lull tells me she understands. I will also provide Sharyn Lull a note for her work - she awaiting her FMLA to be approved.    Goals of Care: 1.  Code Status: DNR confirmed with family.    2. Scope of Treatment: Continue dialysis and supportive treatment.    4. Disposition: Home when stable.    3. Symptom  Management:   1. Anxiety/Agitation: Lorazepam 0.5 mg BID prn.  2. Sleep: Trazodone 25 mg qhs.  3. Mouth Pain - thrush: Complaining of pain with any intake. Magic mouthwash with lidocaine TID.  4. Severe protein calorie malnutrition/decreased appetite: Encourage small frequent meals. Nepro TID between meals.   4. Psychosocial: Emotional support provided to patient and family at bedside.     Brief HPI: 80 yo female admitted with SOB and productive cough r/t sepsis pneumonia. She also is ESRD on HD TTS. Also history of dementia. PMH significant for dementia, HTN, hyperlipidemia, ESRD, and gout.    ROS: Unable to assess - very confused. Family notes agitation, sleep disturbance, decreased appetite.     PMH:  Past Medical History  Diagnosis Date  . Gout   . CKD (chronic kidney disease) stage V requiring chronic dialysis   . Hypertension   . Hyperlipemia      PSH: Past Surgical History  Procedure Laterality Date  . Tubal ligation    . Av fistula placement     I have reviewed the FH and SH and  If appropriate update it with new information. Allergies  Allergen Reactions  . Nsaids     REACTION: Avoid NSAIDS(renal failure)   Scheduled Meds: . albuterol  2.5 mg Nebulization Q4H  . cinacalcet  30 mg Oral Q breakfast  . [  START ON 07/10/2014] darbepoetin (ARANESP) injection - DIALYSIS  60 mcg Intravenous Q Thu-HD  . feeding supplement (NEPRO CARB STEADY)  237 mL Oral BID BM  . guaifenesin  200 mg Oral Q4H while awake  . levofloxacin  750 mg Oral Daily  . midodrine  10 mg Oral TID WC  . multivitamin  1 tablet Oral QHS  . sodium chloride  3 mL Intravenous Q12H   Continuous Infusions:  PRN Meds:.sodium chloride, acetaminophen **OR** acetaminophen, feeding supplement (NEPRO CARB STEADY), heparin, heparin, HYDROmorphone (DILAUDID) injection, lidocaine (PF), lidocaine-prilocaine, LORazepam, ondansetron **OR** ondansetron (ZOFRAN) IV, oxyCODONE, pentafluoroprop-tetrafluoroeth, sodium  chloride    BP 108/54 mmHg  Pulse 81  Temp(Src) 98 F (36.7 C) (Oral)  Resp 13  Ht 5' 4"  (1.626 m)  Wt 52.8 kg (116 lb 6.5 oz)  BMI 19.97 kg/m2  SpO2 94%   PPS: 30% at best   Intake/Output Summary (Last 24 hours) at 07/08/14 1336 Last data filed at 07/08/14 1107  Gross per 24 hour  Intake    420 ml  Output    820 ml  Net   -400 ml   LBM: 12/27  Physical Exam:  General: NAD, sitting up in chair HEENT: Temporal muscle wasting, no JVD, white painful coating back of tongue, painful ulceration healing on right lip crease Chest: No labored breathing, symmetric, room air CVS: RRR Abdomen: Soft, NT, ND Ext: MAE, no edema, warm to touch Neuro: Awake, alert, oriented to person only  Labs: CBC    Component Value Date/Time   WBC 10.7* 07/08/2014 0500   RBC 2.60* 07/08/2014 0500   HGB 7.9* 07/08/2014 0500   HCT 25.6* 07/08/2014 0500   PLT 247 07/08/2014 0500   MCV 98.5 07/08/2014 0500   MCH 30.4 07/08/2014 0500   MCHC 30.9 07/08/2014 0500   RDW 19.0* 07/08/2014 0500   LYMPHSABS 1.3 07/02/2014 1815   MONOABS 0.6 07/02/2014 1815   EOSABS 0.0 07/02/2014 1815   BASOSABS 0.0 07/02/2014 1815    BMET    Component Value Date/Time   NA 134* 07/08/2014 0500   K 3.3* 07/08/2014 0500   CL 100 07/08/2014 0500   CO2 26 07/08/2014 0500   GLUCOSE 60* 07/08/2014 0500   BUN 13 07/08/2014 0500   CREATININE 3.14* 07/08/2014 0500   CALCIUM 7.9* 07/08/2014 0500   CALCIUM 9.6 04/10/2014 0917   GFRNONAA 13* 07/08/2014 0500   GFRAA 15* 07/08/2014 0500    CMP     Component Value Date/Time   NA 134* 07/08/2014 0500   K 3.3* 07/08/2014 0500   CL 100 07/08/2014 0500   CO2 26 07/08/2014 0500   GLUCOSE 60* 07/08/2014 0500   BUN 13 07/08/2014 0500   CREATININE 3.14* 07/08/2014 0500   CALCIUM 7.9* 07/08/2014 0500   CALCIUM 9.6 04/10/2014 0917   PROT 5.8* 07/02/2014 1815   ALBUMIN 1.8* 07/08/2014 0500   AST 26 07/02/2014 1815   ALT 13 07/02/2014 1815   ALKPHOS 71 07/02/2014  1815   BILITOT 1.1 07/02/2014 1815   GFRNONAA 13* 07/08/2014 0500   GFRAA 15* 07/08/2014 0500     Time In Time Out Total Time Spent with Patient Total Overall Time  1310 1410 25mn 659m    Greater than 50%  of this time was spent counseling and coordinating care related to the above assessment and plan.  AlVinie SillNP Palliative Medicine Team Pager # [phone]-F 8a-5p) Team Phone # [phone]/Weekends)

## 2014-07-09 ENCOUNTER — Inpatient Hospital Stay (HOSPITAL_COMMUNITY): Payer: PRIVATE HEALTH INSURANCE

## 2014-07-09 LAB — PROTIME-INR
INR: 4.23 — ABNORMAL HIGH (ref 0.00–1.49)
Prothrombin Time: 41 seconds — ABNORMAL HIGH (ref 11.6–15.2)

## 2014-07-09 LAB — CULTURE, BLOOD (ROUTINE X 2): CULTURE: NO GROWTH

## 2014-07-09 MED ORDER — LEVOFLOXACIN 500 MG PO TABS: 500.0000 mg | ORAL_TABLET | ORAL | Status: DC

## 2014-07-09 MED ORDER — NEPRO/CARBSTEADY PO LIQD
237.0000 mL | Freq: Three times a day (TID) | ORAL | Status: DC
  Administered 2014-07-09: 237 mL via ORAL

## 2014-07-09 MED ORDER — WARFARIN SODIUM 4 MG PO TABS: 4.0000 mg | ORAL_TABLET | Freq: Every evening | ORAL | Status: DC

## 2014-07-09 MED ORDER — MIDODRINE HCL 10 MG PO TABS: 10.0000 mg | ORAL_TABLET | Freq: Three times a day (TID) | ORAL | Status: DC

## 2014-07-09 MED ORDER — GUAIFENESIN 100 MG/5ML PO SYRP: 200.0000 mg | ORAL_SOLUTION | ORAL | Status: DC

## 2014-07-09 NOTE — Care Management Note (Signed)
CARE MANAGEMENT NOTE 07/09/2014  Patient:  [name],[name]   Account Number:  [id number]  Date Initiated:  07/09/2014  Documentation initiated by:  Foday Cone  Subjective/Objective Assessment:   CM following for progression and d/c planning.     Action/Plan:   Pt will d/c to home , [facility]   Anticipated DC Date:  07/09/2014   Anticipated DC Plan:  HOME W HOME HEALTH SERVICES         Choice offered to / List presented to:          [facility]  [facility] RN  HH-6 SOCIAL WORKER      [facility]  [facility]   Status of service:  Completed, signed off Medicare Important Message given?  NO (If response is "NO", the following Medicare IM given date fields will be blank) Date Medicare IM given:   Medicare IM given by:   Date Additional Medicare IM given:   Additional Medicare IM given by:    Discharge Disposition:  HOME W HOME HEALTH SERVICES  Per UR Regulation:    If discussed at Long Length of Stay Meetings, dates discussed:    Comments:  07/09/2014 Pt for d/c to home with family to provide care. [facility] services will ask [facility] to follow.   CRoyal RN MPH, case manager, [phone]

## 2014-07-09 NOTE — Progress Notes (Signed)
Subjective:  pleasantly confused with sitter in room  Objective Vital signs in last 24 hours: Filed Vitals:   07/09/14 0006 07/09/14 0020 07/09/14 0425 07/09/14 0556  BP: 98/57  87/52 84/56  Pulse: 92  93 81  Temp: 97.7 F (36.5 C)  97.8 F (36.6 C) 98.4 F (36.9 C)  TempSrc: Oral  Oral Oral  Resp: 18  17 17   Height:      Weight:      SpO2: 100% 99% 100% 99%   Weight change: -1.6 kg (-3 lb 8.4 oz)  Physical Exam: General: Alert, Confufed, Oreinted to self only Heart: RRR, no gallop. mur or rub Lungs: CTA biilat Abdomen: BS pos. , soft, NT, ND Extremities: : no pedal edema/ L upper arm swelling and at AVF Upper Arm mildly tender to palpitation  Dialysis Access; R ij perm cath/ Pos. Bruit L UA AVF  HD: [facility] 4h 400/800 4K/2.25 [place] 53.5kg R IJ cath / LUA AVF (resting d/t severe infiltration, tortuous course) Aranesp 40 g weekly and heparin 5700 units bolus.  Problem/Plan: 1.  PNA - Nebs and now po Levofloxacin qod/ CXR yesterday no pulm edema / Pna L base persistent  2. ESRD - HD TTS  Noted yesterday Palliative Care Notes stated Daughter wants to continue HD and She would sit with Pt at op hd sec to her agitation / confusion intermittently  3. [facility]/ swelling improved / CD pictures from [facility] vas on paper chart if needed/SP infiltration  With Normal fistulogram = no stenosis 4. Dementia/AMS now about baseline = Family made DNR and continue HD for now with daughter accompanying pt at op hd sec to pt movement / agitation on HD/ noted Palliative Care notes  5. Anemia - hgb 7.9 Aranesp incr 40 to 60mcg ths admit / fu Fe levels  6. Secondary hyperparathyroidism - CA corrected 9.7 phos 2.9 no binder  sensipar 30mg   7. HTN/volume - On midodrine 10mg  tid / wt post hd  52.8 yesterday below edw/ bp in 80s and  asymp this am  Lenny Pastelavid Zeyfang, PA-C [facility] Beeper [phone] 07/09/2014,9:07 AM  LOS: 4 days   Pt seen, examined and  agree w A/P as above.  Vinson Moselleob Sundus Pete MD pager [phone]    cell [phone] 07/09/2014, 11:22 AM    Labs: Basic Metabolic Panel:  Recent Labs Lab 07/05/14 1807 07/06/14 0106 07/08/14 0500  NA 134* 134* 134*  K 4.1 4.0 3.3*  CL 97 98 100  CO2 24 22 26   GLUCOSE 59* 69* 60*  BUN 39* 42* 13  CREATININE 5.17* 5.19* 3.14*  CALCIUM 8.0* 7.5* 7.9*  PHOS  --   --  2.9   Liver Function Tests:  Recent Labs Lab 07/02/14 1815 07/08/14 0500  AST 26  --   ALT 13  --   ALKPHOS 71  --   BILITOT 1.1  --   PROT 5.8*  --   ALBUMIN 2.3* 1.8*  CBC:  Recent Labs Lab 07/02/14 1815 07/05/14 1807 07/06/14 0106 07/07/14 0805 07/08/14 0500  WBC 14.1* 18.4* 13.6* 10.8* 10.7*  NEUTROABS 12.2*  --   --   --   --   HGB 10.0* 10.1* 8.9* 8.7* 7.9*  HCT 31.5* 32.4* 29.3* 27.4* 25.6*  MCV 100.6* 97.6 97.7 100.0 98.5  PLT 177 257 244 241 247   CBG:  Recent Labs Lab 07/05/14 2132 07/05/14 2238 07/05/14 2346 07/06/14 0331 07/06/14 1234  GLUCAP 65* 108* 79 101* 76  Studies/Results: Dg Chest 2 View  07/09/2014   CLINICAL DATA:  Shortness of breath, cough, and wheezing with onset today  EXAM: CHEST  2 VIEW  COMPARISON:  CT scan of the chest of July 06, 2014 and portable chest x-ray of July 05, 2014  FINDINGS: The right lung is well-expanded and clear. On the left there is basilar increased density little changed from the previous study. The cardiac silhouette is enlarged. The pulmonary vascularity is not engorged. A large caliber dual-lumen dialysis type catheter is in place with the tip at the cavoatrial junction. The mediastinum is normal in width.  IMPRESSION: Persistent increased density at the left lung base is consistent with known atelectasis or pneumonia. There is no evidence of pulmonary edema.   Electronically Signed   By: David  SwazilandJordan   On: 07/09/2014 07:58   Medications:   . albuterol  2.5 mg Nebulization Q4H  . cinacalcet  30 mg Oral Q breakfast  . [START ON  07/10/2014] darbepoetin (ARANESP) injection - DIALYSIS  60 mcg Intravenous Q Thu-HD  . feeding supplement (NEPRO CARB STEADY)  237 mL Oral TID BM  . guaifenesin  200 mg Oral Q4H while awake  . [START ON 07/10/2014] levofloxacin  500 mg Oral Q48H  . magic mouthwash w/lidocaine  5 mL Oral TID  . midodrine  10 mg Oral TID WC  . multivitamin  1 tablet Oral QHS  . sodium chloride  3 mL Intravenous Q12H  . traZODone  25 mg Oral QHS

## 2014-07-09 NOTE — Progress Notes (Signed)
Patient discharge teaching given to patient and patient's daughter, including activity, diet, follow-up appoints, and medications. Patient verbalized understanding of all discharge instructions. IV access was d/c'd. Vitals are stable. Skin is intact except as charted in most recent assessments. Pt to be escorted out by NT, to be driven home by family.  Leila Schuff, MBA, BS, RN 

## 2014-07-09 NOTE — Discharge Summary (Signed)
Physician Discharge Summary  [name] DouglasMattie M Page ZOX:[id number] DOB: 1932/11/22 DOA: 07/05/2014  PCP: Dorrene GermanAVBUERE,[name], MD  Admit date: 07/05/2014 Discharge date: 07/09/2014  Time spent: 35minutes  Recommendations for Outpatient Follow-up:  1. Follow up with PCP in 2-4 weeks.  Discharge Diagnoses:  Principal Problem:   Sepsis Active Problems:   Hyperlipidemia   ESRD on dialysis   Hypotension   Protein-calorie malnutrition, severe   Palliative care encounter   Productive cough   Sinus tachycardia   Pleural effusion on left   Anemia of renal disease   Leukocytosis   Oral thrush   Discharge Condition: stable  Diet recommendation: renal   Filed Weights   07/06/14 2008 07/08/14 0700 07/08/14 1107  Weight: 54.341 kg (119 lb 12.8 oz) 54.4 kg (119 lb 14.9 oz) 52.8 kg (116 lb 6.5 oz)    History of present illness:  80 y.o. female with ESRD on HD who presents to the ED with complaints of SOB , and DOE and malaise and weakness along with subjective Fevers and Chills and productive Cough of greenish sputum x 2-3 days. She has had poor intake of foods and liquids for the past 3 days, but no nausea and vomiting. She Has dialysis treatments on Tues, Thursdays, and Saturdays Regularly, and is compliant with the schedule. Her Dialysis Schedule was switched this week due to the holiday schedule and dialysis is for Sunday (tomorrow) instead of Saturday today  Hospital Course:  Sepsis/Pleural effusion on left-leukocytosis: - Most likely PNA. Started empirically on admission on Rocephin and he said to moisten once afebrile De escalate to levaquin which she will continue for 4 additional days. - CT of the chest as below, no PE, PNA and mucus plug. - Schedule albuterol, chest physiotherapy. - Started on mucolytic's.  ESRD on dialysis: - Renal to dialyze today. - Usually dialyzes Tuesday, Thursday and Saturday.  Hypotension: - Continue midodrine  Protein-calorie malnutrition,  severe: - Continue Nepro carb shakes  Anemia of renal disease: - Hemoglobin dropped down to 8.9 from 10.1-follow  Recent DVT-possible PE (11/15) - Held coumadin INR on admission >7.0. Trended down we'll resume after discharge. - no signs of overt bleeding.  Procedures:  CT angiography of the chest  Consultations:  none  Discharge Exam: Filed Vitals:   07/09/14 0556  BP: 84/56  Pulse: 81  Temp: 98.4 F (36.9 C)  Resp: 17    General: A&O x3 Cardiovascular: RRR Respiratory: good air movement CTA B/L  Discharge Instructions   Discharge Instructions    Diet - low sodium heart healthy    Complete by:  As directed      Increase activity slowly    Complete by:  As directed           Current Discharge Medication List    START taking these medications   Details  guaifenesin (ROBITUSSIN) 100 MG/5ML syrup Take 10 mLs (200 mg total) by mouth every 4 (four) hours while awake. Qty: 120 mL, Refills: 0    levofloxacin (LEVAQUIN) 500 MG tablet Take 1 tablet (500 mg total) by mouth every other day. Qty: 3 tablet, Refills: 0      CONTINUE these medications which have CHANGED   Details  warfarin (COUMADIN) 4 MG tablet Take 1 tablet (4 mg total) by mouth every evening. Qty: 30 tablet, Refills: 0      CONTINUE these medications which have NOT CHANGED   Details  albuterol (PROAIR HFA) 108 (90 BASE) MCG/ACT inhaler Inhale 1 puff into the lungs  every 6 (six) hours as needed for wheezing or shortness of breath.     cinacalcet (SENSIPAR) 30 MG tablet Take 1 tablet (30 mg total) by mouth daily with breakfast. Qty: 30 tablet, Refills: 3    HYDROcodone-acetaminophen (NORCO/VICODIN) 5-325 MG per tablet Take 1-2 tablets by mouth every 4 (four) hours as needed for moderate pain or severe pain. Qty: 30 tablet, Refills: 0    LORazepam (ATIVAN) 0.5 MG tablet Take 1 tablet (0.5 mg total) by mouth every 12 (twelve) hours as needed for anxiety. Also-may give 1 hour prior to  dialysis Qty: 30 tablet, Refills: 0    midodrine (PROAMATINE) 10 MG tablet Take 1 tablet (10 mg total) by mouth 3 (three) times daily with meals. Qty: 90 tablet, Refills: 3    multivitamin (RENA-VIT) TABS tablet Take 1 tablet by mouth daily.    nitroGLYCERIN (NITROSTAT) 0.4 MG SL tablet Place 0.4 mg under the tongue every 5 (five) minutes as needed for chest pain.    Nutritional Supplements (FEEDING SUPPLEMENT, NEPRO CARB STEADY,) LIQD Take 237 mLs by mouth 2 (two) times daily between meals. Qty: 60 Can, Refills: 3    traMADol (ULTRAM) 50 MG tablet Take 1 tablet (50 mg total) by mouth every 6 (six) hours as needed for moderate pain. Qty: 30 tablet, Refills: 0       Allergies  Allergen Reactions  . Nsaids     REACTION: Avoid NSAIDS(renal failure)      The results of significant diagnostics from this hospitalization (including imaging, microbiology, ancillary and laboratory) are listed below for reference.    Significant Diagnostic Studies: Dg Chest 2 View  07/09/2014   CLINICAL DATA:  Shortness of breath, cough, and wheezing with onset today  EXAM: CHEST  2 VIEW  COMPARISON:  CT scan of the chest of July 06, 2014 and portable chest x-ray of July 05, 2014  FINDINGS: The right lung is well-expanded and clear. On the left there is basilar increased density little changed from the previous study. The cardiac silhouette is enlarged. The pulmonary vascularity is not engorged. A large caliber dual-lumen dialysis type catheter is in place with the tip at the cavoatrial junction. The mediastinum is normal in width.  IMPRESSION: Persistent increased density at the left lung base is consistent with known atelectasis or pneumonia. There is no evidence of pulmonary edema.   Electronically Signed   By: David  [place]   On: 07/09/2014 07:58   Dg Chest 2 View  07/02/2014   CLINICAL DATA:  Fever, line placement  EXAM: CHEST  2 VIEW  COMPARISON:  06/13/2014  FINDINGS: Heart size is mildly  enlarged. Lungs are hypoaerated with crowding of the bronchovascular markings. Linear bilateral lower lobe scarring or atelectasis. Right-sided dual lumen catheter tip projects over the right atrium. Trace pleural effusions are present. No acute osseous finding. No pneumothorax.  IMPRESSION: Right-sided IJ approach dual lumen catheter with tip over the right atrium.  Trace pleural effusions and bilateral lower lobe probable atelectasis. Minimal prominence of the interstitial markings could indicate very early interstitial edema.   Electronically Signed   By: Christiana Pellant M.D.   On: 07/02/2014 20:16   Ct Angio Chest Pe W/cm &/or Wo Cm  07/06/2014   CLINICAL DATA:  Shortness of breath, cough.  EXAM: CT ANGIOGRAPHY CHEST WITH CONTRAST  TECHNIQUE: Multidetector CT imaging of the chest was performed using the standard protocol during bolus administration of intravenous contrast. Multiplanar CT image reconstructions and MIPs were obtained to evaluate  the vascular anatomy.  CONTRAST:  OMNIPAQUE IOHEXOL 350 MG/ML SOLN  COMPARISON:  Chest x-ray 07/05/2014.  FINDINGS: No filling defects in the pulmonary arteries to suggest pulmonary emboli. Heart is mildly enlarged. Aorta is normal caliber.  Layering material noted within the mainstem bronchi bilaterally. Mucous plugging of the low left lower lobe bronchi with left lower lobe atelectasis or consolidation. Small left pleural effusion. No definite obstructing central mass lesion.  Atelectasis noted at the right lung base posteriorly. Patchy ground-glass opacities noted in the lingula and left upper lobe. No pleural effusion on the right. No mediastinal, hilar, or axillary adenopathy.  Right dialysis catheter is in place. Chest wall soft tissues are unremarkable. Imaging into the upper abdomen shows no acute findings.  Review of the MIP images confirms the above findings.  IMPRESSION: No evidence of pulmonary embolus.  Mucous plugging of the left lower lobe bronchi  with associated left lower lobe atelectasis or consolidation/pneumonia. Small left effusion. Layering debris/ mucous in the mainstem bronchi bilaterally.  Cardiomegaly.   Electronically Signed   By: Charlett Nose M.D.   On: 07/06/2014 23:18   Nm Pulmonary Perf And Vent  06/15/2014   CLINICAL DATA:  80 year old female dialysis patient presenting with chest pain.  EXAM: NUCLEAR MEDICINE VENTILATION - PERFUSION LUNG SCAN  TECHNIQUE: Ventilation images were obtained in multiple projections using inhaled aerosol technetium 99 M DTPA. Perfusion images were obtained in multiple projections after intravenous injection of Tc-80m MAA.  RADIOPHARMACEUTICALS:  30.0 mCi Tc-72m DTPA aerosol and 5.0 mCi Tc-4m MAA  COMPARISON:  No priors.  FINDINGS: Ventilation: Markedly heterogeneous ventilation with large amount of deposition of radiotracer in the hilar regions bilaterally.  Perfusion: No wedge shaped peripheral perfusion defects to suggest acute pulmonary embolism.  IMPRESSION: 1. Very low probability (0-9%) for pulmonary embolism.   Electronically Signed   By: Trudie Reed M.D.   On: 06/15/2014 10:26   Dg Chest Port 1 View  07/05/2014   CLINICAL DATA:  80 year old female with shortness of Breath. Initial encounter.  EXAM: PORTABLE CHEST - 1 VIEW  COMPARISON:  07/02/2014 and earlier.  FINDINGS: Portable AP semi upright view at at 2018 hrs. Stable right chest tunneled dual lumen dialysis catheter. Stable cardiac size and mediastinal contours. No pneumothorax or pulmonary edema. Interval increased left lung base opacity, obscuring most of the left hemidiaphragm.  IMPRESSION: Increased left lung base opacity felt in part due to pleural effusion, with interval lower lobe collapse or consolidation possible. No acute pulmonary edema.   Electronically Signed   By: Augusto Gamble M.D.   On: 07/05/2014 20:33   Dg Chest Portable 1 View  06/13/2014   CLINICAL DATA:  Acute shortness of breath.  Initial encounter.  EXAM: PORTABLE  CHEST - 1 VIEW  COMPARISON:  06/04/2014 prior radiographs  FINDINGS: Cardiomegaly and mild left basilar scarring again noted.  There is no evidence of focal airspace disease, pulmonary edema, suspicious pulmonary nodule/mass, pleural effusion, or pneumothorax. No acute bony abnormalities are identified.  IMPRESSION: Cardiomegaly without evidence of active cardiopulmonary disease.   Electronically Signed   By: Laveda Abbe M.D.   On: 06/13/2014 01:06    Microbiology: Recent Results (from the past 240 hour(s))  Culture, blood (routine x 2)     Status: None   Collection Time: 07/02/14  6:17 PM  Result Value Ref Range Status   Specimen Description BLOOD HAND RIGHT  Final   Special Requests BOTTLES DRAWN AEROBIC AND ANAEROBIC 5CC  Final  Culture  Setup Time   Final    07/02/2014 22:47 Performed at [facility]    Culture   Final    NO GROWTH 5 DAYS Performed at [facility]    Report Status 07/08/2014 FINAL  Final  Urine culture     Status: None   Collection Time: 07/02/14  6:28 PM  Result Value Ref Range Status   Specimen Description URINE, RANDOM  Final   Special Requests NONE  Final   Culture  Setup Time   Final    07/03/2014 02:40 Performed at [facility]    Colony Count NO GROWTH Performed at [facility]   Final   Culture NO GROWTH Performed at [facility]   Final   Report Status 07/03/2014 FINAL  Final  Culture, blood (routine x 2)     Status: None   Collection Time: 07/02/14  8:10 PM  Result Value Ref Range Status   Specimen Description BLOOD RIGHT ARM  Final   Special Requests BOTTLES DRAWN AEROBIC AND ANAEROBIC 10ML  Final   Culture  Setup Time   Final    07/03/2014 01:46 Performed at [facility]    Culture   Final    NO GROWTH 5 DAYS Performed at [facility]    Report Status 07/09/2014 FINAL  Final  Blood culture (routine x 2)     Status: None (Preliminary result)   Collection Time: 07/05/14 11:08 PM   Result Value Ref Range Status   Specimen Description BLOOD RIGHT ARM  Final   Special Requests BOTTLES DRAWN AEROBIC AND ANAEROBIC 5CC EA  Final   Culture   Final           BLOOD CULTURE RECEIVED NO GROWTH TO DATE CULTURE WILL BE HELD FOR 5 DAYS BEFORE ISSUING A FINAL NEGATIVE REPORT Performed at [facility]    Report Status PENDING  Incomplete  Blood culture (routine x 2)     Status: None (Preliminary result)   Collection Time: 07/05/14 11:18 PM  Result Value Ref Range Status   Specimen Description BLOOD RIGHT WRIST  Final   Special Requests BOTTLES DRAWN AEROBIC ONLY 3CC  Final   Culture   Final           BLOOD CULTURE RECEIVED NO GROWTH TO DATE CULTURE WILL BE HELD FOR 5 DAYS BEFORE ISSUING A FINAL NEGATIVE REPORT Performed at [facility]    Report Status PENDING  Incomplete  MRSA PCR Screening     Status: None   Collection Time: 07/06/14 12:11 AM  Result Value Ref Range Status   MRSA by PCR NEGATIVE NEGATIVE Final    Comment:        The GeneXpert MRSA Assay (FDA approved for NASAL specimens only), is one component of a comprehensive MRSA colonization surveillance program. It is not intended to diagnose MRSA infection nor to guide or monitor treatment for MRSA infections.      Labs: Basic Metabolic Panel:  Recent Labs Lab 07/02/14 1815 07/05/14 1807 07/06/14 0106 07/08/14 0500  NA 136 134* 134* 134*  K 3.6 4.1 4.0 3.3*  CL 99 97 98 100  CO2 22 24 22 26   GLUCOSE 112* 59* 69* 60*  BUN 40* 39* 42* 13  CREATININE 5.26* 5.17* 5.19* 3.14*  CALCIUM 8.3* 8.0* 7.5* 7.9*  PHOS  --   --   --  2.9   Liver Function Tests:  Recent Labs Lab 07/02/14 1815 07/08/14 0500  AST 26  --   ALT 13  --   ALKPHOS 71  --   BILITOT 1.1  --   PROT 5.8*  --   ALBUMIN 2.3* 1.8*   No results for input(s): LIPASE, AMYLASE in the last 168 hours. No results for input(s): AMMONIA in the last 168 hours. CBC:  Recent Labs Lab 07/02/14 1815 07/05/14 1807  07/06/14 0106 07/07/14 0805 07/08/14 0500  WBC 14.1* 18.4* 13.6* 10.8* 10.7*  NEUTROABS 12.2*  --   --   --   --   HGB 10.0* 10.1* 8.9* 8.7* 7.9*  HCT 31.5* 32.4* 29.3* 27.4* 25.6*  MCV 100.6* 97.6 97.7 100.0 98.5  PLT 177 257 244 241 247   Cardiac Enzymes: No results for input(s): CKTOTAL, CKMB, CKMBINDEX, TROPONINI in the last 168 hours. BNP: BNP (last 3 results) No results for input(s): PROBNP in the last 8760 hours. CBG:  Recent Labs Lab 07/05/14 2132 07/05/14 2238 07/05/14 2346 07/06/14 0331 07/06/14 1234  GLUCAP 65* 108* 79 101* 76       Signed:  FELIZ ORTIZ, Rosellen Lichtenberger  [facility] 07/09/2014, 9:16 AM

## 2014-07-10 ENCOUNTER — Emergency Department (HOSPITAL_COMMUNITY): Payer: Medicare Other

## 2014-07-10 ENCOUNTER — Encounter (HOSPITAL_COMMUNITY): Payer: Self-pay | Admitting: Emergency Medicine

## 2014-07-10 ENCOUNTER — Inpatient Hospital Stay (HOSPITAL_COMMUNITY)
Admission: EM | Admit: 2014-07-10 | Discharge: 2014-07-17 | DRG: 193 | Disposition: A | Discharge status: Home / Self Care | Payer: Medicare Other | Attending: Internal Medicine | Admitting: Internal Medicine

## 2014-07-10 DIAGNOSIS — N2581 Secondary hyperparathyroidism of renal origin: Secondary | ICD-10-CM | POA: Diagnosis present

## 2014-07-10 DIAGNOSIS — R0602 Shortness of breath: Secondary | ICD-10-CM

## 2014-07-10 DIAGNOSIS — D631 Anemia in chronic kidney disease: Secondary | ICD-10-CM | POA: Diagnosis present

## 2014-07-10 DIAGNOSIS — Z66 Do not resuscitate: Secondary | ICD-10-CM | POA: Diagnosis present

## 2014-07-10 DIAGNOSIS — R0600 Dyspnea, unspecified: Secondary | ICD-10-CM

## 2014-07-10 DIAGNOSIS — J189 Pneumonia, unspecified organism: Secondary | ICD-10-CM | POA: Diagnosis not present

## 2014-07-10 DIAGNOSIS — Z992 Dependence on renal dialysis: Secondary | ICD-10-CM

## 2014-07-10 DIAGNOSIS — L89152 Pressure ulcer of sacral region, stage 2: Secondary | ICD-10-CM | POA: Diagnosis present

## 2014-07-10 DIAGNOSIS — I953 Hypotension of hemodialysis: Secondary | ICD-10-CM | POA: Diagnosis present

## 2014-07-10 DIAGNOSIS — R52 Pain, unspecified: Secondary | ICD-10-CM

## 2014-07-10 DIAGNOSIS — T17990A Other foreign object in respiratory tract, part unspecified in causing asphyxiation, initial encounter: Secondary | ICD-10-CM | POA: Diagnosis present

## 2014-07-10 DIAGNOSIS — I82402 Acute embolism and thrombosis of unspecified deep veins of left lower extremity: Secondary | ICD-10-CM | POA: Diagnosis present

## 2014-07-10 DIAGNOSIS — Z86718 Personal history of other venous thrombosis and embolism: Secondary | ICD-10-CM

## 2014-07-10 DIAGNOSIS — Z681 Body mass index (BMI) 19 or less, adult: Secondary | ICD-10-CM

## 2014-07-10 DIAGNOSIS — F410 Panic disorder [episodic paroxysmal anxiety] without agoraphobia: Secondary | ICD-10-CM | POA: Diagnosis present

## 2014-07-10 DIAGNOSIS — N189 Chronic kidney disease, unspecified: Secondary | ICD-10-CM | POA: Diagnosis present

## 2014-07-10 DIAGNOSIS — N186 End stage renal disease: Secondary | ICD-10-CM

## 2014-07-10 DIAGNOSIS — J90 Pleural effusion, not elsewhere classified: Secondary | ICD-10-CM | POA: Diagnosis present

## 2014-07-10 DIAGNOSIS — Y95 Nosocomial condition: Secondary | ICD-10-CM | POA: Diagnosis present

## 2014-07-10 DIAGNOSIS — I10 Essential (primary) hypertension: Secondary | ICD-10-CM | POA: Diagnosis present

## 2014-07-10 DIAGNOSIS — F0390 Unspecified dementia without behavioral disturbance: Secondary | ICD-10-CM | POA: Diagnosis present

## 2014-07-10 DIAGNOSIS — F0391 Unspecified dementia with behavioral disturbance: Secondary | ICD-10-CM | POA: Diagnosis present

## 2014-07-10 DIAGNOSIS — D638 Anemia in other chronic diseases classified elsewhere: Secondary | ICD-10-CM | POA: Diagnosis present

## 2014-07-10 DIAGNOSIS — E43 Unspecified severe protein-calorie malnutrition: Secondary | ICD-10-CM | POA: Diagnosis present

## 2014-07-10 DIAGNOSIS — E162 Hypoglycemia, unspecified: Secondary | ICD-10-CM | POA: Diagnosis not present

## 2014-07-10 DIAGNOSIS — Z7901 Long term (current) use of anticoagulants: Secondary | ICD-10-CM

## 2014-07-10 DIAGNOSIS — I120 Hypertensive chronic kidney disease with stage 5 chronic kidney disease or end stage renal disease: Secondary | ICD-10-CM | POA: Diagnosis present

## 2014-07-10 DIAGNOSIS — K219 Gastro-esophageal reflux disease without esophagitis: Secondary | ICD-10-CM | POA: Diagnosis present

## 2014-07-10 LAB — CBC WITH DIFFERENTIAL/PLATELET
Basophils Absolute: 0 10*3/uL (ref 0.0–0.1)
Basophils Relative: 0 % (ref 0–1)
EOS ABS: 0 10*3/uL (ref 0.0–0.7)
EOS PCT: 0 % (ref 0–5)
HCT: 29.5 % — ABNORMAL LOW (ref 36.0–46.0)
HEMOGLOBIN: 8.9 g/dL — AB (ref 12.0–15.0)
LYMPHS ABS: 1.8 10*3/uL (ref 0.7–4.0)
Lymphocytes Relative: 15 % (ref 12–46)
MCH: 30.1 pg (ref 26.0–34.0)
MCHC: 30.2 g/dL (ref 30.0–36.0)
MCV: 99.7 fL (ref 78.0–100.0)
MONO ABS: 0.5 10*3/uL (ref 0.1–1.0)
Monocytes Relative: 4 % (ref 3–12)
Neutro Abs: 9.7 10*3/uL — ABNORMAL HIGH (ref 1.7–7.7)
Neutrophils Relative %: 81 % — ABNORMAL HIGH (ref 43–77)
Platelets: 269 10*3/uL (ref 150–400)
RBC: 2.96 MIL/uL — AB (ref 3.87–5.11)
RDW: 18.4 % — ABNORMAL HIGH (ref 11.5–15.5)
WBC: 12 10*3/uL — ABNORMAL HIGH (ref 4.0–10.5)

## 2014-07-10 LAB — LIPASE, BLOOD: Lipase: 33 U/L (ref 11–59)

## 2014-07-10 LAB — TROPONIN I: Troponin I: <0.03 ng/mL (ref <0.031)

## 2014-07-10 LAB — BRAIN NATRIURETIC PEPTIDE: B Natriuretic Peptide: 223.7 pg/mL — ABNORMAL HIGH (ref 0.0–100.0)

## 2014-07-10 NOTE — ED Provider Notes (Signed)
CSN: [id number]     Arrival date & time 07/10/14  2125 History   First MD Initiated Contact with Patient 07/10/14 2250     Chief Complaint  Patient presents with  . Shortness of Breath      HPI Patient presents with concern of dyspnea. Patient has dementia. Level V caveat. Patient is here with her son. He reports that patient had a typical session of dialysis today, was generally well following session. Approximately 1 hour prior to ED arrival patient complained of dyspnea to family members. She had no new pain. There is no report of new fever, vomiting, change in cognition. Patient improved with albuterol therapy in route, provided by EMS. Currently the patient denies complaints, wonders why she is here.  According to son, the patient has been doing generally well since discharged 2 days ago, after admission for similar dyspnea.  She was diagnosed w PNA, and has been taking ABX as directed. I have seen the patient before, and she is interacting in a typical manner for her.    Past Medical History  Diagnosis Date  . Gout   . CKD (chronic kidney disease) stage V requiring chronic dialysis   . Hypertension   . Hyperlipemia    Past Surgical History  Procedure Laterality Date  . Tubal ligation    . Av fistula placement     Family History  Problem Relation Age of Onset  . Colon cancer Neg Hx   . Diabetes Mellitus II Neg Hx    History  Substance Use Topics  . Smoking status: Never Smoker   . Smokeless tobacco: Never Used  . Alcohol Use: No   OB History    No data available     Review of Systems  Constitutional:       Per HPI, otherwise negative  HENT:       Per HPI, otherwise negative  Respiratory:       Per HPI, otherwise negative  Cardiovascular:       Per HPI, otherwise negative  Gastrointestinal: Negative for nausea and vomiting.  Endocrine:       Negative aside from HPI  Genitourinary:       Neg aside from HPI   Musculoskeletal:       Per HPI,  otherwise negative  Skin: Positive for color change.  Neurological: Negative for syncope.      Allergies  Nsaids  Home Medications   Prior to Admission medications   Medication Sig Start Date End Date Taking? Authorizing Provider  albuterol (PROAIR HFA) 108 (90 BASE) MCG/ACT inhaler Inhale 1 puff into the lungs every 6 (six) hours as needed for wheezing or shortness of breath.    Yes Historical Provider, MD  cinacalcet (SENSIPAR) 30 MG tablet Take 1 tablet (30 mg total) by mouth daily with breakfast. 06/09/14  Yes Tora KindredMarianne L York, PA-C  guaifenesin (ROBITUSSIN) 100 MG/5ML syrup Take 10 mLs (200 mg total) by mouth every 4 (four) hours while awake. 07/09/14  Yes Marinda ElkAbraham Feliz Ortiz, MD  HYDROcodone-acetaminophen (NORCO/VICODIN) 5-325 MG per tablet Take 1-2 tablets by mouth every 4 (four) hours as needed for moderate pain or severe pain. 05/10/14  Yes [name], MD  levofloxacin (LEVAQUIN) 500 MG tablet Take 1 tablet (500 mg total) by mouth every other day. 07/10/14  Yes Marinda ElkAbraham Feliz Ortiz, MD  LORazepam (ATIVAN) 0.5 MG tablet Take 1 tablet (0.5 mg total) by mouth every 12 (twelve) hours as needed for anxiety. Also-may give 1 hour prior  to dialysis 06/23/14  Yes Rhetta Mura, MD  midodrine (PROAMATINE) 10 MG tablet Take 1 tablet (10 mg total) by mouth 3 (three) times daily with meals. 07/09/14  Yes [name], MD  multivitamin (RENA-VIT) TABS tablet Take 1 tablet by mouth daily.   Yes Historical Provider, MD  traMADol (ULTRAM) 50 MG tablet Take 1 tablet (50 mg total) by mouth every 6 (six) hours as needed for moderate pain. 06/23/14  Yes Rhetta Mura, MD  nitroGLYCERIN (NITROSTAT) 0.4 MG SL tablet Place 0.4 mg under the tongue every 5 (five) minutes as needed for chest pain.    Historical Provider, MD  Nutritional Supplements (FEEDING SUPPLEMENT, NEPRO CARB STEADY,) LIQD Take 237 mLs by mouth 2 (two) times daily between meals. 06/09/14   Stephani Police, PA-C    warfarin (COUMADIN) 4 MG tablet Take 1 tablet (4 mg total) by mouth every evening. 07/11/14   [name], MD   BP 101/64 mmHg  Pulse 109  Temp(Src) 97.5 F (36.4 C) (Oral)  Resp 19  SpO2 100% Physical Exam  Constitutional: She is oriented to person, place, and time. She appears well-developed and well-nourished. No distress.  HENT:  Head: Normocephalic and atraumatic.  Eyes: Conjunctivae and EOM are normal.  Cardiovascular: Normal rate and regular rhythm.   Pulmonary/Chest: Effort normal and breath sounds normal. No stridor. No respiratory distress.  Abdominal: She exhibits no distension.  Musculoskeletal: She exhibits no edema.       Arms: Neurological: She is alert and oriented to person, place, and time. No cranial nerve deficit.  Skin: Skin is warm and dry.  Psychiatric: She has a normal mood and affect. Her speech is normal and behavior is normal. Cognition and memory are impaired.  Nursing note and vitals reviewed.   ED Course  Procedures (including critical care time) Labs Review Labs Reviewed  COMPREHENSIVE METABOLIC PANEL - Abnormal; Notable for the following:    Glucose, Bld 69 (*)    BUN <5 (*)    Creatinine, Ser 1.47 (*)    Calcium 7.6 (*)    Albumin 2.1 (*)    GFR calc non Af Amer 33 (*)    GFR calc Af Amer 38 (*)    All other components within normal limits  CBC WITH DIFFERENTIAL - Abnormal; Notable for the following:    WBC 12.0 (*)    RBC 2.96 (*)    Hemoglobin 8.9 (*)    HCT 29.5 (*)    RDW 18.4 (*)    Neutrophils Relative % 81 (*)    Neutro Abs 9.7 (*)    All other components within normal limits  BRAIN NATRIURETIC PEPTIDE - Abnormal; Notable for the following:    B Natriuretic Peptide 223.7 (*)    All other components within normal limits  LIPASE, BLOOD  TROPONIN I    Imaging Review Dg Chest 2 View  07/11/2014   CLINICAL DATA:  Dyspnea. History of chronic kidney disease, hypertension.  EXAM: CHEST  2 VIEW  COMPARISON:  Chest radiograph  July 09, 2014 and CT of the chest July 06, 2014  FINDINGS: Cardiac silhouette is moderately enlarged, unchanged. Calcified aortic knob. Persistent LEFT lower lobe consolidation and increasing, now moderate layering pleural effusion. Mild chronic interstitial changes increased lung volumes can be seen with COPD. No pneumothorax.  Tunneled dialysis catheter via RIGHT internal jugular venous approach distal tips projected cavoatrial junction. Patient is osteopenic. Osseous structures are nonsuspicious.  IMPRESSION: Increasing moderate LEFT pleural effusion with underlying  consolidation. Background of apparent COPD. Recommend followup chest radiograph after treatment to verify improvement.  Similar cardiomegaly.   Electronically Signed   By: Awilda Metroourtnay  Bloomer   On: 07/11/2014 00:13   Dg Chest 2 View  07/09/2014   CLINICAL DATA:  Shortness of breath, cough, and wheezing with onset today  EXAM: CHEST  2 VIEW  COMPARISON:  CT scan of the chest of July 06, 2014 and portable chest x-ray of July 05, 2014  FINDINGS: The right lung is well-expanded and clear. On the left there is basilar increased density little changed from the previous study. The cardiac silhouette is enlarged. The pulmonary vascularity is not engorged. A large caliber dual-lumen dialysis type catheter is in place with the tip at the cavoatrial junction. The mediastinum is normal in width.  IMPRESSION: Persistent increased density at the left lung base is consistent with known atelectasis or pneumonia. There is no evidence of pulmonary edema.   Electronically Signed   By: David  [place]   On: 07/09/2014 07:58     EKG Interpretation   Date/Time:  Thursday July 10 2014 21:36:14 EST Ventricular Rate:  119 PR Interval:  122 QRS Duration: 116 QT Interval:  380 QTC Calculation: 535 R Axis:   -84 Text Interpretation:  Sinus or ectopic atrial tachycardia Nonspecific IVCD  with LAD Inferior infarct, old Probable anterior infarct,  age  indeterminate Sinus rhythm Artifact ST-t wave abnormality Abnormal ekg  Confirmed by Gerhard MunchLOCKWOOD, Sayra Frisby  MD ([id number]) on 07/11/2014 12:04:30 AM      O2- 99%ra, nml  cardiac: 101 st, abnormal   On repeat exam the patient remains tachycardic, dyspneic. Vital signs otherwise unremarkable. MDM   Final diagnoses:  Dyspnea   healthcare acquired pneumonia Tachycardia    Patient with dementia, end-stage renal disease, recent admission for pneumonia now presents with increasing dyspnea. Patient is tachycardic, dyspneic, tachypneic.  Patient was started on broader spectrum antibiotics, admitted for further evaluation and management.    Gerhard Munchobert Winnona Wargo, MD 07/11/14 [phone]

## 2014-07-10 NOTE — ED Notes (Addendum)
Pts respirations still tachypneic but no longer labored, pt does not appear to be in any distress at this time, appears much calmer than when she first arrived. States she feels like she is breathing better.

## 2014-07-10 NOTE — ED Notes (Addendum)
Pt arrives via [facility], called out for sob that began all of a sudden at rest. Pt D/c from hospital yesterday for after being treated for influenza and a cough, has hx of asthma. Pt given breathing tx at home then given 5mg  of albuterol, denies any chest pain. O2 sat 99 on room air, but still having mildly labored breathing and tachypnea that she states is because her bottom is hurting-sacral ulcer present and bandaged. Pt in early dementia according to her family, but they states she is at her baseline, family states pt was given anxiety meds upon discharge, feels like her sob may be related to anxiety. Pt had dialysis today without any complications. Pt hypotensive, pts family states this is normal for her.  alert, oriented.

## 2014-07-11 ENCOUNTER — Inpatient Hospital Stay (HOSPITAL_COMMUNITY): Payer: Medicare Other

## 2014-07-11 ENCOUNTER — Encounter (HOSPITAL_COMMUNITY): Payer: Self-pay | Admitting: *Deleted

## 2014-07-11 DIAGNOSIS — D631 Anemia in chronic kidney disease: Secondary | ICD-10-CM | POA: Diagnosis not present

## 2014-07-11 DIAGNOSIS — J90 Pleural effusion, not elsewhere classified: Secondary | ICD-10-CM | POA: Diagnosis not present

## 2014-07-11 DIAGNOSIS — Y95 Nosocomial condition: Secondary | ICD-10-CM | POA: Diagnosis present

## 2014-07-11 DIAGNOSIS — J189 Pneumonia, unspecified organism: Principal | ICD-10-CM

## 2014-07-11 DIAGNOSIS — Z992 Dependence on renal dialysis: Secondary | ICD-10-CM

## 2014-07-11 DIAGNOSIS — T17990A Other foreign object in respiratory tract, part unspecified in causing asphyxiation, initial encounter: Secondary | ICD-10-CM | POA: Diagnosis present

## 2014-07-11 DIAGNOSIS — I120 Hypertensive chronic kidney disease with stage 5 chronic kidney disease or end stage renal disease: Secondary | ICD-10-CM | POA: Diagnosis present

## 2014-07-11 DIAGNOSIS — J948 Other specified pleural conditions: Secondary | ICD-10-CM

## 2014-07-11 DIAGNOSIS — N2581 Secondary hyperparathyroidism of renal origin: Secondary | ICD-10-CM | POA: Diagnosis present

## 2014-07-11 DIAGNOSIS — Z9889 Other specified postprocedural states: Secondary | ICD-10-CM | POA: Diagnosis not present

## 2014-07-11 DIAGNOSIS — D638 Anemia in other chronic diseases classified elsewhere: Secondary | ICD-10-CM | POA: Diagnosis present

## 2014-07-11 DIAGNOSIS — Z681 Body mass index (BMI) 19 or less, adult: Secondary | ICD-10-CM | POA: Diagnosis not present

## 2014-07-11 DIAGNOSIS — F410 Panic disorder [episodic paroxysmal anxiety] without agoraphobia: Secondary | ICD-10-CM | POA: Diagnosis present

## 2014-07-11 DIAGNOSIS — I10 Essential (primary) hypertension: Secondary | ICD-10-CM | POA: Diagnosis not present

## 2014-07-11 DIAGNOSIS — J439 Emphysema, unspecified: Secondary | ICD-10-CM | POA: Diagnosis not present

## 2014-07-11 DIAGNOSIS — R0602 Shortness of breath: Secondary | ICD-10-CM | POA: Diagnosis present

## 2014-07-11 DIAGNOSIS — E43 Unspecified severe protein-calorie malnutrition: Secondary | ICD-10-CM | POA: Diagnosis present

## 2014-07-11 DIAGNOSIS — Z86718 Personal history of other venous thrombosis and embolism: Secondary | ICD-10-CM | POA: Diagnosis not present

## 2014-07-11 DIAGNOSIS — N186 End stage renal disease: Secondary | ICD-10-CM

## 2014-07-11 DIAGNOSIS — I82402 Acute embolism and thrombosis of unspecified deep veins of left lower extremity: Secondary | ICD-10-CM | POA: Diagnosis present

## 2014-07-11 DIAGNOSIS — F0391 Unspecified dementia with behavioral disturbance: Secondary | ICD-10-CM | POA: Diagnosis not present

## 2014-07-11 DIAGNOSIS — K219 Gastro-esophageal reflux disease without esophagitis: Secondary | ICD-10-CM

## 2014-07-11 DIAGNOSIS — I953 Hypotension of hemodialysis: Secondary | ICD-10-CM | POA: Diagnosis present

## 2014-07-11 DIAGNOSIS — Z7901 Long term (current) use of anticoagulants: Secondary | ICD-10-CM | POA: Diagnosis not present

## 2014-07-11 DIAGNOSIS — L89152 Pressure ulcer of sacral region, stage 2: Secondary | ICD-10-CM | POA: Diagnosis present

## 2014-07-11 DIAGNOSIS — Z66 Do not resuscitate: Secondary | ICD-10-CM | POA: Diagnosis present

## 2014-07-11 DIAGNOSIS — E162 Hypoglycemia, unspecified: Secondary | ICD-10-CM | POA: Diagnosis not present

## 2014-07-11 LAB — CBC WITH DIFFERENTIAL/PLATELET
Basophils Absolute: 0 10*3/uL (ref 0.0–0.1)
Basophils Relative: 0 % (ref 0–1)
Eosinophils Absolute: 0 10*3/uL (ref 0.0–0.7)
Eosinophils Relative: 0 % (ref 0–5)
HCT: 24.5 % — ABNORMAL LOW (ref 36.0–46.0)
HEMOGLOBIN: 7.8 g/dL — AB (ref 12.0–15.0)
LYMPHS ABS: 1.9 10*3/uL (ref 0.7–4.0)
LYMPHS PCT: 18 % (ref 12–46)
MCH: 31.6 pg (ref 26.0–34.0)
MCHC: 31.8 g/dL (ref 30.0–36.0)
MCV: 99.2 fL (ref 78.0–100.0)
MONOS PCT: 6 % (ref 3–12)
Monocytes Absolute: 0.6 10*3/uL (ref 0.1–1.0)
NEUTROS PCT: 76 % (ref 43–77)
Neutro Abs: 8.2 10*3/uL — ABNORMAL HIGH (ref 1.7–7.7)
PLATELETS: 246 10*3/uL (ref 150–400)
RBC: 2.47 MIL/uL — ABNORMAL LOW (ref 3.87–5.11)
RDW: 18.6 % — ABNORMAL HIGH (ref 11.5–15.5)
WBC: 10.8 10*3/uL — ABNORMAL HIGH (ref 4.0–10.5)

## 2014-07-11 LAB — COMPREHENSIVE METABOLIC PANEL
ALK PHOS: 63 U/L (ref 39–117)
ALT: 14 U/L (ref 0–35)
ALT: 11 U/L (ref 0–35)
ANION GAP: 11 (ref 5–15)
AST: 29 U/L (ref 0–37)
AST: 19 U/L (ref 0–37)
Albumin: 2.1 g/dL — ABNORMAL LOW (ref 3.5–5.2)
Albumin: 1.9 g/dL — ABNORMAL LOW (ref 3.5–5.2)
Alkaline Phosphatase: 53 U/L (ref 39–117)
Anion gap: 11 (ref 5–15)
BILIRUBIN TOTAL: 1.2 mg/dL (ref 0.3–1.2)
BUN: <5 mg/dL — ABNORMAL LOW (ref 6–23)
CALCIUM: 7.6 mg/dL — AB (ref 8.4–10.5)
CHLORIDE: 99 meq/L (ref 96–112)
CHLORIDE: 100 meq/L (ref 96–112)
CO2: 26 mmol/L (ref 19–32)
CO2: 26 mmol/L (ref 19–32)
CREATININE: 1.89 mg/dL — AB (ref 0.50–1.10)
Calcium: 7.6 mg/dL — ABNORMAL LOW (ref 8.4–10.5)
Creatinine, Ser: 1.47 mg/dL — ABNORMAL HIGH (ref 0.50–1.10)
GFR calc Af Amer: 38 mL/min — ABNORMAL LOW (ref >90)
GFR calc non Af Amer: 33 mL/min — ABNORMAL LOW (ref >90)
GFR calc non Af Amer: 24 mL/min — ABNORMAL LOW (ref >90)
GFR, EST AFRICAN AMERICAN: 28 mL/min — AB (ref >90)
Glucose, Bld: 69 mg/dL — ABNORMAL LOW (ref 70–99)
Glucose, Bld: 72 mg/dL (ref 70–99)
POTASSIUM: 4.3 mmol/L (ref 3.5–5.1)
Potassium: 3.9 mmol/L (ref 3.5–5.1)
Sodium: 136 mmol/L (ref 135–145)
Sodium: 137 mmol/L (ref 135–145)
Total Bilirubin: 0.9 mg/dL (ref 0.3–1.2)
Total Protein: 6 g/dL (ref 6.0–8.3)
Total Protein: 5.4 g/dL — ABNORMAL LOW (ref 6.0–8.3)

## 2014-07-11 LAB — PROTIME-INR
INR: 2.21 — ABNORMAL HIGH (ref 0.00–1.49)
PROTHROMBIN TIME: 24.7 s — AB (ref 11.6–15.2)

## 2014-07-11 LAB — PHOSPHORUS: Phosphorus: 1.9 mg/dL — ABNORMAL LOW (ref 2.3–4.6)

## 2014-07-11 LAB — MAGNESIUM: Magnesium: 1.6 mg/dL (ref 1.5–2.5)

## 2014-07-11 MED ORDER — DEXTROSE 5 % IV SOLN
1.0000 g | Freq: Once | INTRAVENOUS | Status: AC
  Administered 2014-07-11: 1 g via INTRAVENOUS
  Filled 2014-07-11: qty 1

## 2014-07-11 MED ORDER — MIDODRINE HCL 5 MG PO TABS
10.0000 mg | ORAL_TABLET | Freq: Three times a day (TID) | ORAL | Status: DC
  Administered 2014-07-11 – 2014-07-17 (×18): 10 mg via ORAL
  Filled 2014-07-11 (×22): qty 2

## 2014-07-11 MED ORDER — DEXTROSE 5 % IV SOLN
1.0000 g | Freq: Three times a day (TID) | INTRAVENOUS | Status: DC
  Administered 2014-07-11: 1 g via INTRAVENOUS
  Filled 2014-07-11 (×2): qty 1

## 2014-07-11 MED ORDER — VANCOMYCIN HCL 500 MG IV SOLR
500.0000 mg | INTRAVENOUS | Status: AC
  Administered 2014-07-11: 500 mg via INTRAVENOUS
  Filled 2014-07-11: qty 500

## 2014-07-11 MED ORDER — LORAZEPAM 0.5 MG PO TABS
0.5000 mg | ORAL_TABLET | Freq: Two times a day (BID) | ORAL | Status: DC | PRN
  Administered 2014-07-13 – 2014-07-17 (×3): 0.5 mg via ORAL
  Filled 2014-07-11 (×2): qty 1

## 2014-07-11 MED ORDER — HYDROCODONE-ACETAMINOPHEN 5-325 MG PO TABS: 1.0000 | ORAL_TABLET | ORAL | Status: DC | PRN

## 2014-07-11 MED ORDER — SODIUM CHLORIDE 0.9 % IV SOLN
500.0000 mg | INTRAVENOUS | Status: DC
  Administered 2014-07-13 – 2014-07-17 (×3): 500 mg via INTRAVENOUS
  Filled 2014-07-11 (×6): qty 500

## 2014-07-11 MED ORDER — ALBUTEROL SULFATE HFA 108 (90 BASE) MCG/ACT IN AERS: 1.0000 | INHALATION_SPRAY | Freq: Four times a day (QID) | RESPIRATORY_TRACT | Status: DC | PRN

## 2014-07-11 MED ORDER — TRAMADOL HCL 50 MG PO TABS: 50.0000 mg | ORAL_TABLET | Freq: Four times a day (QID) | ORAL | Status: DC | PRN

## 2014-07-11 MED ORDER — WARFARIN - PHARMACIST DOSING INPATIENT
Freq: Every day | Status: DC
  Administered 2014-07-12: 18:00:00
  Administered 2014-07-14: 1
  Administered 2014-07-15: 17:00:00

## 2014-07-11 MED ORDER — GUAIFENESIN 100 MG/5ML PO SYRP: 200.0000 mg | ORAL_SOLUTION | ORAL | Status: DC

## 2014-07-11 MED ORDER — LORAZEPAM 2 MG/ML IJ SOLN
0.5000 mg | Freq: Once | INTRAMUSCULAR | Status: AC
Start: 2014-07-11 — End: 2014-07-11
  Administered 2014-07-11: 0.5 mg via INTRAVENOUS
  Filled 2014-07-11: qty 1

## 2014-07-11 MED ORDER — DEXTROSE 5 % IV SOLN
2.0000 g | INTRAVENOUS | Status: DC
  Administered 2014-07-13 – 2014-07-17 (×3): 2 g via INTRAVENOUS
  Filled 2014-07-11 (×4): qty 2

## 2014-07-11 MED ORDER — NITROGLYCERIN 0.4 MG SL SUBL: 0.4000 mg | SUBLINGUAL_TABLET | SUBLINGUAL | Status: DC | PRN

## 2014-07-11 MED ORDER — CINACALCET HCL 30 MG PO TABS
30.0000 mg | ORAL_TABLET | Freq: Every day | ORAL | Status: DC
  Administered 2014-07-11 – 2014-07-17 (×7): 30 mg via ORAL
  Filled 2014-07-11 (×9): qty 1

## 2014-07-11 MED ORDER — WARFARIN SODIUM 2 MG PO TABS
2.0000 mg | ORAL_TABLET | Freq: Once | ORAL | Status: AC
  Administered 2014-07-11: 2 mg via ORAL
  Filled 2014-07-11: qty 1

## 2014-07-11 MED ORDER — ALBUTEROL SULFATE (2.5 MG/3ML) 0.083% IN NEBU: 2.5000 mg | INHALATION_SOLUTION | RESPIRATORY_TRACT | Status: AC | PRN

## 2014-07-11 MED ORDER — GUAIFENESIN ER 600 MG PO TB12
600.0000 mg | ORAL_TABLET | Freq: Two times a day (BID) | ORAL | Status: DC
  Administered 2014-07-11 – 2014-07-17 (×13): 600 mg via ORAL
  Filled 2014-07-11 (×15): qty 1

## 2014-07-11 MED ORDER — VANCOMYCIN HCL 500 MG IV SOLR
500.0000 mg | INTRAVENOUS | Status: DC
  Administered 2014-07-11: 500 mg via INTRAVENOUS
  Filled 2014-07-11: qty 500

## 2014-07-11 NOTE — Progress Notes (Signed)
Pt does not wish to do CPT during the night. She does not want to be woken up. RT will continue treatments with the flutter in the morning.

## 2014-07-11 NOTE — ED Notes (Signed)
Pt becoming somewhat agitated, Dr. Jeraldine Loots made aware, requested something to help patient relax.

## 2014-07-11 NOTE — Consult Note (Signed)
WOC wound consult note Reason for Consult: Stage II (partial thickness) pressure ulcer at sacrum Wound type:Pressure in combination with shear due to elevated HOB Pressure Ulcer POA: Yes Measurement:0.2cm round x 0.2cm Wound ZOX:WRUEA, pink, moist Drainage (amount, consistency, odor) scant serous Periwound: intact, clear Dressing procedure/placement/frequency: Soft silicone foam dressing and turning and repositioning per house protocol. Additionally, I have provided her today with a pressure redistribution chair pad for her use when OOB in the chair.  She will take this home with her upon discharge. Daughter Archie Patten) in room at the time of my assessment and I have shown her this area on her mother for her information and so that she can gauge the wound progress. WOC nursing team will not follow, but will remain available to this patient, the nursing and medical teams.  Please re-consult if needed. Thanks, Ladona Mow, MSN, RN, GNP, [place], CWON-AP [phone])

## 2014-07-11 NOTE — H&P (Signed)
[facility] History and Physical  Patient: [name]  ZOX:[id number]  DOB: 05-01-1933  DOS: the patient was seen and examined on 07/11/2014 PCP: Dorrene German, MD  Chief Complaint: Shortness of breath  HPI: [name] is a 81 y.o. female with Past medical history of ESRD on hemodialysis, hypertension, dyslipidemia, GERD, gout. The patient is presenting with complaints of shortness of breath. History was obtained from patient's son who is the power of attorney and was at bedside. As per the son the patient was at her baseline and was doing better after her recent discharge she was ambulating better she did not have any cough or shortness of breath. She didn't have any fever or chills. She has been eating good and there was no episode of aspiration.  Tonight before going to bedthe patient was given incentive spirometry and was working with it and within a few minutes become short of breath. She did not have any chest pain did not have any choking did not have any abdominal pain. She was compliant with all the medications. At the time of evaluation patient denied any complaint of chest pain chest heaviness chest tightness or shortness of breath. No fever no chills reported.  The patient is coming from home And at her baseline independent for most of her ADL.  Review of Systems: as mentioned in the history of present illness.  A Comprehensive review of the other systems is negative.  Past Medical History  Diagnosis Date  . Gout   . CKD (chronic kidney disease) stage V requiring chronic dialysis   . Hypertension   . Hyperlipemia    Past Surgical History  Procedure Laterality Date  . Tubal ligation    . Av fistula placement     Social History:  reports that she has never smoked. She has never used smokeless tobacco. She reports that she does not drink alcohol or use illicit drugs.  Allergies  Allergen Reactions  . Nsaids Other (See Comments)    REACTION: Avoid  NSAIDS(renal failure)    Family History  Problem Relation Age of Onset  . Colon cancer Neg Hx   . Diabetes Mellitus II Neg Hx     Prior to Admission medications   Medication Sig Start Date End Date Taking? Authorizing Provider  albuterol (PROAIR HFA) 108 (90 BASE) MCG/ACT inhaler Inhale 1 puff into the lungs every 6 (six) hours as needed for wheezing or shortness of breath.    Yes Historical Provider, MD  cinacalcet (SENSIPAR) 30 MG tablet Take 1 tablet (30 mg total) by mouth daily with breakfast. 06/09/14  Yes [name], PA-C  guaifenesin (ROBITUSSIN) 100 MG/5ML syrup Take 10 mLs (200 mg total) by mouth every 4 (four) hours while awake. 07/09/14  Yes [name], MD  HYDROcodone-acetaminophen (NORCO/VICODIN) 5-325 MG per tablet Take 1-2 tablets by mouth every 4 (four) hours as needed for moderate pain or severe pain. 05/10/14  Yes Olivia Mackie, MD  levofloxacin (LEVAQUIN) 500 MG tablet Take 1 tablet (500 mg total) by mouth every other day. 07/10/14  Yes [name], MD  LORazepam (ATIVAN) 0.5 MG tablet Take 1 tablet (0.5 mg total) by mouth every 12 (twelve) hours as needed for anxiety. Also-may give 1 hour prior to dialysis 06/23/14  Yes Rhetta Mura, MD  midodrine (PROAMATINE) 10 MG tablet Take 1 tablet (10 mg total) by mouth 3 (three) times daily with meals. 07/09/14  Yes [name], MD  multivitamin (RENA-VIT) TABS tablet  Take 1 tablet by mouth daily.   Yes Historical Provider, MD  traMADol (ULTRAM) 50 MG tablet Take 1 tablet (50 mg total) by mouth every 6 (six) hours as needed for moderate pain. 06/23/14  Yes Rhetta Mura, MD  nitroGLYCERIN (NITROSTAT) 0.4 MG SL tablet Place 0.4 mg under the tongue every 5 (five) minutes as needed for chest pain.    Historical Provider, MD  Nutritional Supplements (FEEDING SUPPLEMENT, NEPRO CARB STEADY,) LIQD Take 237 mLs by mouth 2 (two) times daily between meals. 06/09/14   Stephani Police, PA-C  warfarin  (COUMADIN) 4 MG tablet Take 1 tablet (4 mg total) by mouth every evening. 07/11/14   [name], MD    Physical Exam: Filed Vitals:   07/11/14 0130 07/11/14 0145 07/11/14 0223 07/11/14 0432  BP:  107/46 100/57 102/50  Pulse:  110 84 70  Temp:   98.6 F (37 C) 99.3 F (37.4 C)  TempSrc:   Oral Oral  Resp: Height:    (1.626 m)   Weight:   52.802 kg (116 lb 6.5 oz)   SpO2:  100% 95% 93%    General: Alert, Awake and Oriented to Time, Place and Person. Appear in mild distress Eyes: PERRL ENT: Oral Mucosa clear moist. Neck: mild JVD Cardiovascular: S1 and S2 Present, aortic systolic Murmur, Peripheral Pulses Present Respiratory: Bilateral Air entry equal and Decreased, basalCrackles, no wheezes Abdomen: Bowel Sound present, Soft and non tender Skin: no Rash Extremities: no Pedal edema, no calf tenderness Neurologic: Grossly no focal neuro deficit.  Labs on Admission:  CBC:  Recent Labs Lab 07/05/14 1807 07/06/14 0106 07/07/14 0805 07/08/14 0500 07/10/14 2307  WBC 18.4* 13.6* 10.8* 10.7* 12.0*  NEUTROABS  --   --   --   --  9.7*  HGB 10.1* 8.9* 8.7* 7.9* 8.9*  HCT 32.4* 29.3* 27.4* 25.6* 29.5*  MCV 97.6 97.7 100.0 98.5 99.7  PLT 257 244 241 247 269    CMP     Component Value Date/Time   NA 136 07/10/2014 2307   K 4.3 07/10/2014 2307   CL 99 07/10/2014 2307   CO2 26 07/10/2014 2307   GLUCOSE 69* 07/10/2014 2307   BUN <5* 07/10/2014 2307   CREATININE 1.47* 07/10/2014 2307   CALCIUM 7.6* 07/10/2014 2307   CALCIUM 9.6 04/10/2014 0917   PROT 6.0 07/10/2014 2307   ALBUMIN 2.1* 07/10/2014 2307   AST 29 07/10/2014 2307   ALT 14 07/10/2014 2307   ALKPHOS 63 07/10/2014 2307   BILITOT 1.2 07/10/2014 2307   GFRNONAA 33* 07/10/2014 2307   GFRAA 38* 07/10/2014 2307     Recent Labs Lab 07/10/14 2307  LIPASE 33   No results for input(s): AMMONIA in the last 168 hours.   Recent Labs Lab 07/10/14 2307  TROPONINI <0.03   BNP (last 3  results) No results for input(s): PROBNP in the last 8760 hours.  Radiological Exams on Admission: Dg Chest 2 View  07/11/2014   CLINICAL DATA:  Dyspnea. History of chronic kidney disease, hypertension.  EXAM: CHEST  2 VIEW  COMPARISON:  Chest radiograph July 09, 2014 and CT of the chest July 06, 2014  FINDINGS: Cardiac silhouette is moderately enlarged, unchanged. Calcified aortic knob. Persistent LEFT lower lobe consolidation and increasing, now moderate layering pleural effusion. Mild chronic interstitial changes increased lung volumes can be seen with COPD. No pneumothorax.  Tunneled dialysis catheter via RIGHT internal jugular venous approach distal tips projected  cavoatrial junction. Patient is osteopenic. Osseous structures are nonsuspicious.  IMPRESSION: Increasing moderate LEFT pleural effusion with underlying consolidation. Background of apparent COPD. Recommend followup chest radiograph after treatment to verify improvement.  Similar cardiomegaly.   Electronically Signed   By: Awilda Metro   On: 07/11/2014 00:13    EKG: Independently reviewed. sinus tachycardia.  Assessment/Plan Principal Problem:   HCAP (healthcare-associated pneumonia) Active Problems:   GERD   ESRD on dialysis   Dementia with behavioral disturbance   Hypertension   Pleural effusion on left   Anemia of renal disease   1. HCAP (healthcare-associated pneumonia)  The patient is presenting with complaints of cough and shortness of breath. This was sudden in onset. Recent presentation was similar symptoms and SCD and judgment chest was negative for any abnormality. Chest x-ray shows increasing pleural effusion with consultation on the left side. On my review of chest x-ray pleural effusion does not appear to be significantly worsening. Patient was on levofloxacin at the time of discharge. At present she will be treated broadly with cefepime and vancomycin. We will continue to closely monitor  cultures. Possible suspicion for her sudden onset of shortness of breath is more likely mucous plugging, panic attack, pleural effusion causing pleuritic chest pain due to pleurisy. We will give patient flutter device and Mucinex as of the cough. If does not improve than  May pulmonary consultation or chest PT.  2. ESRD on hemodialysis  Renal dozing off medications  May require nephrology consultation should the patient's stay on Saturday.  3.dementia. Continue home medications.  4.DVT Recent INR supratherapeutic. Continue warfarin. Check INR  Advance goals of care discussion: DNR DNI   DVT Prophylaxis: on chronic anticoagulation Nutrition: renal diet  Family Communication: son was present at bedside, opportunity was given to ask question and all questions were answered satisfactorily at the time of interview. Disposition: Admitted to inpatient in telemetry unit.  Author: Lynden Oxford, MD [facility] Pager: [phone] 07/11/2014, 6:19 AM    If 7PM-7AM, please contact night-coverage [email or web address] Password [email or web address]

## 2014-07-11 NOTE — Discharge Instructions (Signed)

## 2014-07-11 NOTE — Progress Notes (Signed)
[facility] PROGRESS NOTE  Assessment/Plan: HCAP (healthcare-associated pneumonia)/Pleural effusion on left: - Started empirically on IV vancomycin and cefepime. Unclear to me why she became SOB. Now she relates her respiration is imrpoved. - Consult respiratory for therapy. - WBC improved, consult IR for thoracocentesis. - ct without constrast INR therapeutic unlikely PE.  Anemia of renal disease/ESRD on dialysis: - per renal.    Essential  Hypertension - cont midodrine.  Dementia with behavioral disturbance - cont medications.   GERD - PPI.    Code Status: DNR Family Communication: Daughters at bedside Disposition Plan: To be determined-transfer to med surg DVT prophylaxis: INR supratherapeutic   Consultants:  renal  Procedures:  CXR  Ct chest  Antibiotics:  vanc and cefepime  HPI/Subjective: SOB imrpoved  Objective: Filed Vitals:   07/11/14 0145 07/11/14 0223 07/11/14 0432 07/11/14 0900  BP: 107/46 100/57 102/50 96/50  Pulse: 110 84 70 93  Temp:  98.6 F (37 C) 99.3 F (37.4 C) 98.6 F (37 C)  TempSrc:  Oral Oral Oral  Resp:  Height:   (1.626 m)    Weight:  52.802 kg (116 lb 6.5 oz)    SpO2: 100% 95% 93% 100%    Intake/Output Summary (Last 24 hours) at 07/11/14 0930 Last data filed at 07/11/14 0600  Gross per 24 hour  Intake    100 ml  Output      0 ml  Net    100 ml   Filed Weights   07/11/14 0223  Weight: 52.802 kg (116 lb 6.5 oz)    Exam:  General: Alert, awake, oriented x3, in no acute distress.  HEENT: No bruits, no goiter.  Heart: Regular rate and rhythm. Lungs: Good air movement, clear Abdomen: Soft, nontender, nondistended, positive bowel sounds.     Data Reviewed: Basic Metabolic Panel:  Recent Labs Lab 07/05/14 1807 07/06/14 0106 07/08/14 0500 07/10/14 2307 07/11/14 0655  NA 134* 134* 134* 136 137  K 4.1 4.0 3.3* 4.3 3.9  CL 97 98 100 99 100  CO2 GLUCOSE 59* 69* 60*  69* 72  BUN 39* 42* 13 <5* <5*  CREATININE 5.17* 5.19* 3.14* 1.47* 1.89*  CALCIUM 8.0* 7.5* 7.9* 7.6* 7.6*  MG  --   --   --   --  1.6  PHOS  --   --  2.9  --  1.9*   Liver Function Tests:  Recent Labs Lab 07/08/14 0500 07/10/14 2307 07/11/14 0655  AST  --  29 19  ALT  --  14 11  ALKPHOS  --  63 53  BILITOT  --  1.2 0.9  PROT  --  6.0 5.4*  ALBUMIN 1.8* 2.1* 1.9*    Recent Labs Lab 07/10/14 2307  LIPASE 33   No results for input(s): AMMONIA in the last 168 hours. CBC:  Recent Labs Lab 07/06/14 0106 07/07/14 0805 07/08/14 0500 07/10/14 2307 07/11/14 0655  WBC 13.6* 10.8* 10.7* 12.0* 10.8*  NEUTROABS  --   --   --  9.7* 8.2*  HGB 8.9* 8.7* 7.9* 8.9* 7.8*  HCT 29.3* 27.4* 25.6* 29.5* 24.5*  MCV 97.7 100.0 98.5 99.7 99.2  PLT 244 241 247 269 246   Cardiac Enzymes:  Recent Labs Lab 07/10/14 2307  TROPONINI <0.03   BNP (last 3 results) No results for input(s): PROBNP in the last 8760 hours. CBG:  Recent Labs Lab 07/05/14 2132 07/05/14 2238 07/05/14 2346 07/06/14  0331 07/06/14 1234  GLUCAP 65* 108* 79 101* 76    Recent Results (from the past 240 hour(s))  Culture, blood (routine x 2)     Status: None   Collection Time: 07/02/14  6:17 PM  Result Value Ref Range Status   Specimen Description BLOOD HAND RIGHT  Final   Special Requests BOTTLES DRAWN AEROBIC AND ANAEROBIC 5CC  Final   Culture  Setup Time   Final    07/02/2014 22:47 Performed at [facility]    Culture   Final    NO GROWTH 5 DAYS Performed at [facility]    Report Status 07/08/2014 FINAL  Final  Urine culture     Status: None   Collection Time: 07/02/14  6:28 PM  Result Value Ref Range Status   Specimen Description URINE, RANDOM  Final   Special Requests NONE  Final   Culture  Setup Time   Final    07/03/2014 02:40 Performed at [facility]    Colony Count NO GROWTH Performed at [facility]   Final   Culture NO GROWTH Performed at  [facility]   Final   Report Status 07/03/2014 FINAL  Final  Culture, blood (routine x 2)     Status: None   Collection Time: 07/02/14  8:10 PM  Result Value Ref Range Status   Specimen Description BLOOD RIGHT ARM  Final   Special Requests BOTTLES DRAWN AEROBIC AND ANAEROBIC  Final   Culture  Setup Time   Final    07/03/2014 01:46 Performed at [facility]    Culture   Final    NO GROWTH 5 DAYS Performed at [facility]    Report Status 07/09/2014 FINAL  Final  Blood culture (routine x 2)     Status: None (Preliminary result)   Collection Time: 07/05/14 11:08 PM  Result Value Ref Range Status   Specimen Description BLOOD RIGHT ARM  Final   Special Requests BOTTLES DRAWN AEROBIC AND ANAEROBIC 5CC EA  Final   Culture   Final           BLOOD CULTURE RECEIVED NO GROWTH TO DATE CULTURE WILL BE HELD FOR 5 DAYS BEFORE ISSUING A FINAL NEGATIVE REPORT Performed at [facility]    Report Status PENDING  Incomplete  Blood culture (routine x 2)     Status: None (Preliminary result)   Collection Time: 07/05/14 11:18 PM  Result Value Ref Range Status   Specimen Description BLOOD RIGHT WRIST  Final   Special Requests BOTTLES DRAWN AEROBIC ONLY 3CC  Final   Culture   Final           BLOOD CULTURE RECEIVED NO GROWTH TO DATE CULTURE WILL BE HELD FOR 5 DAYS BEFORE ISSUING A FINAL NEGATIVE REPORT Performed at [facility]    Report Status PENDING  Incomplete  MRSA PCR Screening     Status: None   Collection Time: 07/06/14 12:11 AM  Result Value Ref Range Status   MRSA by PCR NEGATIVE NEGATIVE Final    Comment:        The GeneXpert MRSA Assay (FDA approved for NASAL specimens only), is one component of a comprehensive MRSA colonization surveillance program. It is not intended to diagnose MRSA infection nor to guide or monitor treatment for MRSA infections.      Studies: Dg Chest 2 View  07/11/2014   CLINICAL DATA:  Dyspnea. History of  chronic kidney disease, hypertension.  EXAM: CHEST  2 VIEW  COMPARISON:  Chest radiograph July 09, 2014 and CT of the chest July 06, 2014  FINDINGS: Cardiac silhouette is moderately enlarged, unchanged. Calcified aortic knob. Persistent LEFT lower lobe consolidation and increasing, now moderate layering pleural effusion. Mild chronic interstitial changes increased lung volumes can be seen with COPD. No pneumothorax.  Tunneled dialysis catheter via RIGHT internal jugular venous approach distal tips projected cavoatrial junction. Patient is osteopenic. Osseous structures are nonsuspicious.  IMPRESSION: Increasing moderate LEFT pleural effusion with underlying consolidation. Background of apparent COPD. Recommend followup chest radiograph after treatment to verify improvement.  Similar cardiomegaly.   Electronically Signed   By: Awilda Metro   On: 07/11/2014 00:13    Scheduled Meds: . ceFEPime (MAXIPIME) IV  1 g Intravenous Q8H  . cinacalcet  30 mg Oral Q breakfast  . guaiFENesin  600 mg Oral BID  . midodrine  10 mg Oral TID WC  . vancomycin  500 mg Intravenous Q24H   Continuous Infusions:    [name]  [facility] Pager [phone]. If 8PM-8AM, please contact night-coverage at [email or web address], password [name] 07/11/2014, 9:30 AM  LOS: 1 day

## 2014-07-11 NOTE — Progress Notes (Signed)
ANTICOAGULATION CONSULT NOTE - Initial Consult  Pharmacy Consult for coumadin Indication: hx recent DVT  Allergies  Allergen Reactions  . Nsaids Other (See Comments)    REACTION: Avoid NSAIDS(renal failure)    Patient Measurements: Height:  (162.6 cm) Weight: 116 lb 6.5 oz (52.802 kg) IBW/kg (Calculated) : 54.7  Vital Signs: Temp: 98.6 F (37 C) (01/01 0900) Temp Source: Oral (01/01 0900) BP: 96/50 mmHg (01/01 0900) Pulse Rate: 93 (01/01 0900)  Labs:  Recent Labs  07/09/14 0945 07/10/14 2307 07/11/14 0655  HGB  --  8.9* 7.8*  HCT  --  29.5* 24.5*  PLT  --  269 246  LABPROT 41.0*  --  24.7*  INR 4.23*  --  2.21*  CREATININE  --  1.47* 1.89*  TROPONINI  --  <0.03  --     Estimated Creatinine Clearance: 20.1 mL/min (by C-G formula based on Cr of 1.89).   Medical History: Past Medical History  Diagnosis Date  . Gout   . CKD (chronic kidney disease) stage V requiring chronic dialysis   . Hypertension   . Hyperlipemia     Assessment: Patient is a 81 y.o F with ESRD on coumadin PTA for recent DVT (06/06/14).  INR was supratherapeutic on 12/30 with 4.23 but now down to 2.21.  Goal of Therapy:  INR 2-3    Plan:  - coumadin  PO x1 today - d/c vancomycin  q24h.  Since patient is on HD, will give  IV x1 now to get 1gm total for today.  Will f/u with HD schedule and start  after each session. - d/c cefepim,e 1gm IV q8h. Will change cefepime to 2gm qHD    Heywood Tokunaga P 07/11/2014,9:40 AM

## 2014-07-11 NOTE — Progress Notes (Signed)
ANTIBIOTIC CONSULT NOTE - INITIAL  Pharmacy Consult for Vancomycin Indication: pneumonia  Allergies  Allergen Reactions  . Nsaids Other (See Comments)    REACTION: Avoid NSAIDS(renal failure)    Patient Measurements:   Adjusted Body Weight: n/a  Vital Signs: Temp: 97.5 F (36.4 C) (12/31 2148) Temp Source: Oral (12/31 2148) BP: 101/64 mmHg (01/01 0030) Pulse Rate: 109 (01/01 0030) Intake/Output from previous day:   Intake/Output from this shift:    Labs:  Recent Labs  07/08/14 0500 07/10/14 2307  WBC 10.7* 12.0*  HGB 7.9* 8.9*  PLT 247 269  CREATININE 3.14* 1.47*   Estimated Creatinine Clearance: 25.9 mL/min (by C-G formula based on Cr of 1.47). No results for input(s): VANCOTROUGH, VANCOPEAK, VANCORANDOM, GENTTROUGH, GENTPEAK, GENTRANDOM, TOBRATROUGH, TOBRAPEAK, TOBRARND, AMIKACINPEAK, AMIKACINTROU, AMIKACIN in the last 72 hours.   Microbiology: Recent Results (from the past 720 hour(s))  Clostridium Difficile by PCR     Status: None   Collection Time: 06/17/14  6:00 PM  Result Value Ref Range Status   C difficile by pcr NEGATIVE NEGATIVE Final  Culture, blood (routine x 2)     Status: None   Collection Time: 07/02/14  6:17 PM  Result Value Ref Range Status   Specimen Description BLOOD HAND RIGHT  Final   Special Requests BOTTLES DRAWN AEROBIC AND ANAEROBIC 5CC  Final   Culture  Setup Time   Final    07/02/2014 22:47 Performed at [facility]    Culture   Final    NO GROWTH 5 DAYS Performed at [facility]    Report Status 07/08/2014 FINAL  Final  Urine culture     Status: None   Collection Time: 07/02/14  6:28 PM  Result Value Ref Range Status   Specimen Description URINE, RANDOM  Final   Special Requests NONE  Final   Culture  Setup Time   Final    07/03/2014 02:40 Performed at [facility]    Colony Count NO GROWTH Performed at [facility]   Final   Culture NO GROWTH Performed at [facility]  Final   Report Status 07/03/2014 FINAL  Final  Culture, blood (routine x 2)     Status: None   Collection Time: 07/02/14  8:10 PM  Result Value Ref Range Status   Specimen Description BLOOD RIGHT ARM  Final   Special Requests BOTTLES DRAWN AEROBIC AND ANAEROBIC  Final   Culture  Setup Time   Final    07/03/2014 01:46 Performed at [facility]    Culture   Final    NO GROWTH 5 DAYS Performed at [facility]    Report Status 07/09/2014 FINAL  Final  Blood culture (routine x 2)     Status: None (Preliminary result)   Collection Time: 07/05/14 11:08 PM  Result Value Ref Range Status   Specimen Description BLOOD RIGHT ARM  Final   Special Requests BOTTLES DRAWN AEROBIC AND ANAEROBIC 5CC EA  Final   Culture   Final           BLOOD CULTURE RECEIVED NO GROWTH TO DATE CULTURE WILL BE HELD FOR 5 DAYS BEFORE ISSUING A FINAL NEGATIVE REPORT Performed at [facility]    Report Status PENDING  Incomplete  Blood culture (routine x 2)     Status: None (Preliminary result)   Collection Time: 07/05/14 11:18 PM  Result Value Ref Range Status   Specimen Description BLOOD RIGHT WRIST  Final  Special Requests BOTTLES DRAWN AEROBIC ONLY 3CC  Final   Culture   Final           BLOOD CULTURE RECEIVED NO GROWTH TO DATE CULTURE WILL BE HELD FOR 5 DAYS BEFORE ISSUING A FINAL NEGATIVE REPORT Performed at [facility]    Report Status PENDING  Incomplete  MRSA PCR Screening     Status: None   Collection Time: 07/06/14 12:11 AM  Result Value Ref Range Status   MRSA by PCR NEGATIVE NEGATIVE Final    Comment:        The GeneXpert MRSA Assay (FDA approved for NASAL specimens only), is one component of a comprehensive MRSA colonization surveillance program. It is not intended to diagnose MRSA infection nor to guide or monitor treatment for MRSA infections.     Medical History: Past Medical History  Diagnosis Date  . Gout   . CKD (chronic kidney disease)  stage V requiring chronic dialysis   . Hypertension   . Hyperlipemia     Medications:   (Not in a hospital admission) Assessment: 65 YOF with dyspnea. She was diagnosed with pneumonia and has been taking antibiotics as directed pta. Pharmacy consulted to start Vancomycin for HCAP. She also received a dose of Cefepime in the ED. WBC is elevated at 12. Pt is afebrile.   Goal of Therapy:  Vancomycin trough level 15-20 mcg/ml  Plan:  -Start Vancomycin 500 mg IV Q 24 hours -F/u gram neg coverage  -Monitor CBC, renal fx, cultures and patient's clinical progress -F/u VT at [facility]   Vinnie Level, PharmD., BCPS Clinical Pharmacist Pager [phone]

## 2014-07-12 ENCOUNTER — Inpatient Hospital Stay (HOSPITAL_COMMUNITY): Payer: Medicare Other

## 2014-07-12 LAB — BASIC METABOLIC PANEL
Anion gap: 11 (ref 5–15)
BUN: 12 mg/dL (ref 6–23)
CHLORIDE: 100 meq/L (ref 96–112)
CO2: 25 mmol/L (ref 19–32)
Calcium: 8.3 mg/dL — ABNORMAL LOW (ref 8.4–10.5)
Creatinine, Ser: 3.41 mg/dL — ABNORMAL HIGH (ref 0.50–1.10)
GFR calc Af Amer: 14 mL/min — ABNORMAL LOW (ref >90)
GFR calc non Af Amer: 12 mL/min — ABNORMAL LOW (ref >90)
Glucose, Bld: 61 mg/dL — ABNORMAL LOW (ref 70–99)
Potassium: 4.1 mmol/L (ref 3.5–5.1)
Sodium: 136 mmol/L (ref 135–145)

## 2014-07-12 LAB — CBC
HEMATOCRIT: 32.3 % — AB (ref 36.0–46.0)
Hemoglobin: 9.9 g/dL — ABNORMAL LOW (ref 12.0–15.0)
MCH: 31.4 pg (ref 26.0–34.0)
MCHC: 30.7 g/dL (ref 30.0–36.0)
MCV: 102.5 fL — AB (ref 78.0–100.0)
PLATELETS: 347 10*3/uL (ref 150–400)
RBC: 3.15 MIL/uL — ABNORMAL LOW (ref 3.87–5.11)
RDW: 19 % — ABNORMAL HIGH (ref 11.5–15.5)
WBC: 13.7 10*3/uL — ABNORMAL HIGH (ref 4.0–10.5)

## 2014-07-12 LAB — BODY FLUID CELL COUNT WITH DIFFERENTIAL
Lymphs, Fluid: 26 %
Monocyte-Macrophage-Serous Fluid: 13 % — ABNORMAL LOW (ref 50–90)
Neutrophil Count, Fluid: 61 % — ABNORMAL HIGH (ref 0–25)
Total Nucleated Cell Count, Fluid: 762 cu mm (ref 0–1000)

## 2014-07-12 LAB — PROTIME-INR
INR: 1.8 — ABNORMAL HIGH (ref 0.00–1.49)
Prothrombin Time: 21.1 seconds — ABNORMAL HIGH (ref 11.6–15.2)

## 2014-07-12 MED ORDER — WARFARIN SODIUM 5 MG PO TABS
5.0000 mg | ORAL_TABLET | Freq: Once | ORAL | Status: AC
  Administered 2014-07-12: 5 mg via ORAL
  Filled 2014-07-12: qty 1

## 2014-07-12 MED ORDER — LIDOCAINE HCL (PF) 1 % IJ SOLN
INTRAMUSCULAR | Status: AC
  Filled 2014-07-12: qty 10

## 2014-07-12 NOTE — Progress Notes (Signed)
Pt does not want to do CPT treatment when sleeping. RT will do the next CPT treatment in the morning.

## 2014-07-12 NOTE — Progress Notes (Signed)
[facility] PROGRESS NOTE  Assessment/Plan: HCAP (healthcare-associated pneumonia)/Pleural effusion on left: - Started empirically on IV vancomycin and cefepime. Afebrile.  - Consult respiratory for therapy. - IR performed thoracocentesis, repeat CXR. - ct without constrast INR therapeutic unlikely PE.  Anemia of renal disease/ESRD on dialysis: - per renal.    Essential  Hypertension - cont midodrine.  Dementia with behavioral disturbance - cont medications.   GERD - PPI.    Code Status: DNR Family Communication: Daughters at bedside Disposition Plan: To be determined-transfer to med surg DVT prophylaxis: INR supratherapeutic   Consultants:  renal  Procedures:  CXR  Ct chest  Antibiotics:  vanc and cefepime  HPI/Subjective: No complains  Objective: Filed Vitals:   07/12/14 0509 07/12/14 0901 07/12/14 0920 07/12/14 0940  BP: 89/55  Pulse: 85 85    Temp: 98.3 F (36.8 C) 98.3 F (36.8 C)    TempSrc: Oral Oral    Resp: 18 17    Height:      Weight:      SpO2: 100% 99% 100% 100%    Intake/Output Summary (Last 24 hours) at 07/12/14 0954 Last data filed at 07/12/14 0900  Gross per 24 hour  Intake    360 ml  Output      1 ml  Net    359 ml   Filed Weights   07/11/14 0223 07/11/14 2130  Weight: 52.802 kg (116 lb 6.5 oz) 53.298 kg (117 lb 8 oz)    Exam:  General: Alert, awake, oriented x3, in no acute distress.  HEENT: No bruits, no goiter.  Heart: Regular rate and rhythm. Lungs: Good air movement, clear Abdomen: Soft, nontender, nondistended, positive bowel sounds.     Data Reviewed: Basic Metabolic Panel:  Recent Labs Lab 07/05/14 1807 07/06/14 0106 07/08/14 0500 07/10/14 2307 07/11/14 0655  NA 134* 134* 134* 136 137  K 4.1 4.0 3.3* 4.3 3.9  CL 97 98 100 99 100  CO2 GLUCOSE 59* 69* 60* 69* 72  BUN 39* 42* 13 <5* <5*  CREATININE 5.17* 5.19* 3.14* 1.47* 1.89*  CALCIUM 8.0* 7.5* 7.9*  7.6* 7.6*  MG  --   --   --   --  1.6  PHOS  --   --  2.9  --  1.9*   Liver Function Tests:  Recent Labs Lab 07/08/14 0500 07/10/14 2307 07/11/14 0655  AST  --  29 19  ALT  --  14 11  ALKPHOS  --  63 53  BILITOT  --  1.2 0.9  PROT  --  6.0 5.4*  ALBUMIN 1.8* 2.1* 1.9*    Recent Labs Lab 07/10/14 2307  LIPASE 33   No results for input(s): AMMONIA in the last 168 hours. CBC:  Recent Labs Lab 07/06/14 0106 07/07/14 0805 07/08/14 0500 07/10/14 2307 07/11/14 0655  WBC 13.6* 10.8* 10.7* 12.0* 10.8*  NEUTROABS  --   --   --  9.7* 8.2*  HGB 8.9* 8.7* 7.9* 8.9* 7.8*  HCT 29.3* 27.4* 25.6* 29.5* 24.5*  MCV 97.7 100.0 98.5 99.7 99.2  PLT 244 241 247 269 246   Cardiac Enzymes:  Recent Labs Lab 07/10/14 2307  TROPONINI <0.03   BNP (last 3 results) No results for input(s): PROBNP in the last 8760 hours. CBG:  Recent Labs Lab 07/05/14 2132 07/05/14 2238 07/05/14 2346 07/06/14 0331 07/06/14 1234  GLUCAP 65* 108* 79 101* 76    Recent Results (from  the past 240 hour(s))  Culture, blood (routine x 2)     Status: None   Collection Time: 07/02/14  6:17 PM  Result Value Ref Range Status   Specimen Description BLOOD HAND RIGHT  Final   Special Requests BOTTLES DRAWN AEROBIC AND ANAEROBIC 5CC  Final   Culture  Setup Time   Final    07/02/2014 22:47 Performed at [facility]    Culture   Final    NO GROWTH 5 DAYS Performed at [facility]    Report Status 07/08/2014 FINAL  Final  Urine culture     Status: None   Collection Time: 07/02/14  6:28 PM  Result Value Ref Range Status   Specimen Description URINE, RANDOM  Final   Special Requests NONE  Final   Culture  Setup Time   Final    07/03/2014 02:40 Performed at [facility]    Colony Count NO GROWTH Performed at [facility]   Final   Culture NO GROWTH Performed at [facility]   Final   Report Status 07/03/2014 FINAL  Final  Culture, blood (routine x 2)      Status: None   Collection Time: 07/02/14  8:10 PM  Result Value Ref Range Status   Specimen Description BLOOD RIGHT ARM  Final   Special Requests BOTTLES DRAWN AEROBIC AND ANAEROBIC  Final   Culture  Setup Time   Final    07/03/2014 01:46 Performed at [facility]    Culture   Final    NO GROWTH 5 DAYS Performed at [facility]    Report Status 07/09/2014 FINAL  Final  Blood culture (routine x 2)     Status: None (Preliminary result)   Collection Time: 07/05/14 11:08 PM  Result Value Ref Range Status   Specimen Description BLOOD RIGHT ARM  Final   Special Requests BOTTLES DRAWN AEROBIC AND ANAEROBIC 5CC EA  Final   Culture   Final           BLOOD CULTURE RECEIVED NO GROWTH TO DATE CULTURE WILL BE HELD FOR 5 DAYS BEFORE ISSUING A FINAL NEGATIVE REPORT Performed at [facility]    Report Status PENDING  Incomplete  Blood culture (routine x 2)     Status: None (Preliminary result)   Collection Time: 07/05/14 11:18 PM  Result Value Ref Range Status   Specimen Description BLOOD RIGHT WRIST  Final   Special Requests BOTTLES DRAWN AEROBIC ONLY 3CC  Final   Culture   Final           BLOOD CULTURE RECEIVED NO GROWTH TO DATE CULTURE WILL BE HELD FOR 5 DAYS BEFORE ISSUING A FINAL NEGATIVE REPORT Performed at [facility]    Report Status PENDING  Incomplete  MRSA PCR Screening     Status: None   Collection Time: 07/06/14 12:11 AM  Result Value Ref Range Status   MRSA by PCR NEGATIVE NEGATIVE Final    Comment:        The GeneXpert MRSA Assay (FDA approved for NASAL specimens only), is one component of a comprehensive MRSA colonization surveillance program. It is not intended to diagnose MRSA infection nor to guide or monitor treatment for MRSA infections.      Studies: Dg Chest 2 View  07/11/2014   CLINICAL DATA:  Dyspnea. History of chronic kidney disease, hypertension.  EXAM: CHEST  2 VIEW  COMPARISON:  Chest radiograph July 09, 2014  and CT  of the chest July 06, 2014  FINDINGS: Cardiac silhouette is moderately enlarged, unchanged. Calcified aortic knob. Persistent LEFT lower lobe consolidation and increasing, now moderate layering pleural effusion. Mild chronic interstitial changes increased lung volumes can be seen with COPD. No pneumothorax.  Tunneled dialysis catheter via RIGHT internal jugular venous approach distal tips projected cavoatrial junction. Patient is osteopenic. Osseous structures are nonsuspicious.  IMPRESSION: Increasing moderate LEFT pleural effusion with underlying consolidation. Background of apparent COPD. Recommend followup chest radiograph after treatment to verify improvement.  Similar cardiomegaly.   Electronically Signed   By: Awilda Metro   On: 07/11/2014 00:13   Ct Chest Wo Contrast  07/11/2014   CLINICAL DATA:  Persistent shortness of breath.  EXAM: CT CHEST WITHOUT CONTRAST  TECHNIQUE: Multidetector CT imaging of the chest was performed following the standard protocol without IV contrast.  COMPARISON:  07/06/2014  FINDINGS: Chest wall: No breast masses, supraclavicular or axillary lymphadenopathy. A right IJ dialysis catheter is stable. The bony thorax is intact.  Mediastinum: The heart is borderline enlarged but stable. No pericardial effusion. No mediastinal or hilar mass or adenopathy. Stable coronary artery and aortic calcifications. The pulmonary arteries are enlarged suggesting pulmonary hypertension.  Lungs: There is persistent left lower lobe airspace consolidation with air bronchograms and cystic changes. There is also persistent loculated pleural fluid collections. Could not exclude pneumonia, early pulmonary abscess and empyema. Stable emphysematous changes. No worrisome pulmonary nodules. Stable right basilar scarring changes.  Upper abdomen: Cholelithiasis and probable chronic left UPJ obstruction.  IMPRESSION: Persistent left lower lobe process suspicious for pneumonia with possible early  lung abscess and empyema.  Enlarged pulmonary artery suggesting pulmonary hypertension.  Emphysematous changes and pulmonary scarring.  Cholelithiasis and probable chronic left UPJ obstruction   Electronically Signed   By: Loralie Champagne M.D.   On: 07/11/2014 12:49    Scheduled Meds: . ceFEPime (MAXIPIME) IV  2 g Intravenous Q T,Th,Sa-HD  . cinacalcet  30 mg Oral Q breakfast  . guaiFENesin  600 mg Oral BID  . lidocaine (PF)      . midodrine  10 mg Oral TID WC  . vancomycin  500 mg Intravenous Q T,Th,Sa-HD  . Warfarin - Pharmacist Dosing Inpatient   Does not apply q1800   Continuous Infusions:    [name]  [facility] Pager [phone]. If 8PM-8AM, please contact night-coverage at [email or web address], password [facility] 07/12/2014, 9:54 AM  LOS: 2 days

## 2014-07-12 NOTE — Progress Notes (Addendum)
ANTICOAGULATION CONSULT NOTE - Follow Up Consult  Pharmacy Consult for warfarin Indication: hx recent DVT  Allergies  Allergen Reactions  . Nsaids Other (See Comments)    REACTION: Avoid NSAIDS(renal failure)    Patient Measurements: Height:  (162.6 cm) Weight: 117 lb 8 oz (53.298 kg) IBW/kg (Calculated) : 54.7  Vital Signs: Temp: 98.3 F (36.8 C) (01/02 0901) Temp Source: Oral (01/02 0901) BP: 89/55 mmHg (01/02 0940) Pulse Rate: 85 (01/02 0901)  Labs:  Recent Labs  07/10/14 2307 07/11/14 0655 07/12/14 0528  HGB 8.9* 7.8*  --   HCT 29.5* 24.5*  --   PLT 269 246  --   LABPROT  --  24.7* 21.1*  INR  --  2.21* 1.80*  CREATININE 1.47* 1.89*  --   TROPONINI <0.03  --   --     Estimated Creatinine Clearance: 20.3 mL/min (by C-G formula based on Cr of 1.89).  Assessment: 81 y/o female with ESRD on warfarin PTA for recent DVT (06/06/14). INR was SUPRAtherapeutic on admission at 4.23 on 12/30.   INR is now SUBtherapeutic and has trended down to 1.8 today. She received a reduced dose yesterday. Will give extra today. No bleeding noted, Hb is low at 7.8, platelets are normal. Since DVT is >72 days old and Hb is low, would not bridge as risk seems greater than benefit.  Goal of Therapy:  INR 2-3 Monitor platelets by anticoagulation protocol: Yes   Plan:  - Warfarin 5 mg PO tonight - INR daily - Monitor for s/sx of bleeding  [facility], [address].D., BCPS Clinical Pharmacist Pager: [phone] 07/12/2014 11:41 AM

## 2014-07-12 NOTE — Procedures (Signed)
Successful US guided left thoracentesis. Yielded of cloudy blood tinged fluid. Pt tolerated procedure well. No immediate complications.  Specimen was sent for labs. CXR ordered.  Brayton El PA-C 07/12/2014 9:44 AM

## 2014-07-12 NOTE — Consult Note (Signed)
Renal Service Consult Note [facility]  [name] 07/12/2014 [place] D Requesting Physician:  Dr Aileen Fass  Reason for Consult:  ESRD patient presented with HPI: The patient is a 81 y.o. year-old with hx of HTN, ESRD on HD , recent DVT , dementia presented to ED on 12/31 with SOB and possible anxiety. CXR showed increasef pleural effusion left side, started on IV abx.  Needs HD.  Pt has poor memory and can't provide any history; hx provided by family.   Chart review: 5/04 - chest pain > normal heart cath, LVEF 45-50%, CKD creat 2.1, anemia, hx asthma 10/07 - acute/chronic renal failure d/t low BP/ ACEi, HTN, CKD 3/08 - abd/ back pain / fever / dysuria > EColi UTI, acute on CKD resolved, f/u OP with renal MD 11/25 - 06/09/14 > SOB and AMS x 1 week > just recently started HD, had 3 sessions. In ED pt with low BP/s, no fever. UA dirty > admitted , dx was SOB d/t anxiety vs small PE. LE doppler +for DVT. No RV strain on echo. Clinically stable w/o hypoxemia so VQ/ CT angio were not done. Rx hep/ coumadin for DVT, f/u PCP. EColi UTI rx abx. AMS head CT neg, felt underlying dementia c/b UTI, etc 12/3 - 06/23/14 > presented to ED with SOB, missed HD and vol overload > admitted, SOB improved with HD.  Patient remained confused, w/u negative, felt to have dementia.  Palliative care met with family, recommended rtamadol and Ativan and daughters said they would accompany patient to OP HD to determine if she could tolerate HD or if they should pursue comfort care.  VQ low prob for PE. Diarrhea, resolved, Cdif neg. UTI rec'd Rocephin, no cx done. ESRDon HD TTS.  LE DVT + and started on coumadin.  Hypotension continued on midodrine.  Severe malnutrition, cont supplements.  12/26 - 07/09/14 >  SOB, DOE, weakness, fevers, chills , cough > rx'd for PNA with empiric abx.  CT chest no PE. ESRD got HD TTS, severe PCM got supplements. INR up 7.0, held and trended down.      ROS  no CP,  cough  no fever  no HA  no n/v/d  hx provided by family  Past Medical History  Past Medical History  Diagnosis Date  . Gout   . CKD (chronic kidney disease) stage V requiring chronic dialysis   . Hypertension   . Hyperlipemia    Past Surgical History  Past Surgical History  Procedure Laterality Date  . Tubal ligation    . Av fistula placement     Family History  Family History  Problem Relation Age of Onset  . Colon cancer Neg Hx   . Diabetes Mellitus II Neg Hx    Social History  reports that she has never smoked. She has never used smokeless tobacco. She reports that she does not drink alcohol or use illicit drugs. Allergies  Allergies  Allergen Reactions  . Nsaids Other (See Comments)    REACTION: Avoid NSAIDS(renal failure)   Home medications Prior to Admission medications   Medication Sig Start Date End Date Taking? Authorizing Provider  albuterol (PROAIR HFA) 108 (90 BASE) MCG/ACT inhaler Inhale 1 puff into the lungs every 6 (six) hours as needed for wheezing or shortness of breath.    Yes Historical Provider, MD  cinacalcet (SENSIPAR) 30 MG tablet Take 1 tablet (30 mg total) by mouth daily with breakfast. 06/09/14  Yes [name], PA-C  guaifenesin (ROBITUSSIN) 100 MG/5ML syrup Take 10 mLs (200 mg total) by mouth every 4 (four) hours while awake. 07/09/14  Yes [name], MD  HYDROcodone-acetaminophen (NORCO/VICODIN) 5-325 MG per tablet Take 1-2 tablets by mouth every 4 (four) hours as needed for moderate pain or severe pain. 05/10/14  Yes [name], MD  levofloxacin (LEVAQUIN) 500 MG tablet Take 1 tablet (500 mg total) by mouth every other day. 07/10/14  Yes [name], MD  LORazepam (ATIVAN) 0.5 MG tablet Take 1 tablet (0.5 mg total) by mouth every 12 (twelve) hours as needed for anxiety. Also-may give 1 hour prior to dialysis 06/23/14  Yes [name], MD  midodrine (PROAMATINE) 10 MG tablet Take 1 tablet (10 mg total) by mouth 3  (three) times daily with meals. 07/09/14  Yes [name], MD  multivitamin (RENA-VIT) TABS tablet Take 1 tablet by mouth daily.   Yes Historical Provider, MD  traMADol (ULTRAM) 50 MG tablet Take 1 tablet (50 mg total) by mouth every 6 (six) hours as needed for moderate pain. 06/23/14  Yes [name], MD  nitroGLYCERIN (NITROSTAT) 0.4 MG SL tablet Place 0.4 mg under the tongue every 5 (five) minutes as needed for chest pain.    Historical Provider, MD  Nutritional Supplements (FEEDING SUPPLEMENT, NEPRO CARB STEADY,) LIQD Take 237 mLs by mouth 2 (two) times daily between meals. 06/09/14   [name], PA-C  warfarin (COUMADIN) 4 MG tablet Take 1 tablet (4 mg total) by mouth every evening. 07/11/14   [name], MD   Liver Function Tests  Recent Labs Lab 07/08/14 0500 07/10/14 2307 07/11/14 0655  AST  --  29 19  ALT  --  14 11  ALKPHOS  --  63 53  BILITOT  --  1.2 0.9  PROT  --  6.0 5.4*  ALBUMIN 1.8* 2.1* 1.9*    Recent Labs Lab 07/10/14 2307  LIPASE 33   CBC  Recent Labs Lab 07/10/14 2307 07/11/14 0655 07/12/14 1032  WBC 12.0* 10.8* 13.7*  NEUTROABS 9.7* 8.2*  --   HGB 8.9* 7.8* 9.9*  HCT 29.5* 24.5* 32.3*  MCV 99.7 99.2 102.5*  PLT 269 246 426   Basic Metabolic Panel  Recent Labs Lab 07/05/14 1807 07/06/14 0106 07/08/14 0500 07/10/14 2307 07/11/14 0655 07/12/14 1032  NA 134* 134* 134* 136 137 136  K 4.1 4.0 3.3* 4.3 3.9 4.1  CL 97 98 100 99 100 100  CO2 _0 GLUCOSE 59* 69* 60* 69* 72 61*  BUN 39* 42* 13 <5* <5* 12  CREATININE 5.17* 5.19* 3.14* 1.47* 1.89* 3.41*  CALCIUM 8.0* 7.5* 7.9* 7.6* 7.6* 8.3*  PHOS  --   --  2.9  --  1.9*  --     Filed Vitals:   07/12/14 0509 07/12/14 0901 07/12/14 0920 07/12/14 0940  BP: _1 89/55  Pulse: 85 85    Temp: 98.3 F (36.8 C) 98.3 F (36.8 C)    TempSrc: Oral Oral    Resp: 18 17    Height:      Weight:      SpO2: 100% 99% 100% 100%   Exam Alert,  pleasantly confused, interactive No rash, cyanosis or gangrene Sclera anicteric, throat clear No jvd Chest clear bilat  RRR no MRG Abd soft, NTND No LE edema or deformity LUA large hematoma medial axilla area, nontender; diffuse ecchymosis over remaining AVF. +bruit Neuro confused, pleasant, interactive  HD: TTS [place] From last admit >  4h 4K/2.25 [place] 53.5kgheparin 5700 units bolus  R IJ cath / LUA AVF (resting d/t severe infiltration, tortuous course) Aranesp 40 g weekly  Assessment: 1. Dyspnea / L pleural effusion / HCAP - on emp abx, s/p thoracentesis by IR 2. ESRD on HD 3. Dementia - pleasantly confused 4. Hypotension, chronic on midodrine 5. Anemia, cont aranesp 6. Volume looks euvolemic, no excess on exam   Plan- HD tomorrow in place of Saturday, keep even  Kelly Splinter MD (pgr) [phone]    [phone] 07/12/2014, 6:00 PM

## 2014-07-13 LAB — CULTURE, BLOOD (ROUTINE X 2)
CULTURE: NO GROWTH
Culture: NO GROWTH

## 2014-07-13 LAB — PROTIME-INR
INR: 1.4 (ref 0.00–1.49)
Prothrombin Time: 17.3 seconds — ABNORMAL HIGH (ref 11.6–15.2)

## 2014-07-13 MED ORDER — LIDOCAINE HCL (PF) 1 % IJ SOLN: 5.0000 mL | INTRAMUSCULAR | Status: DC | PRN

## 2014-07-13 MED ORDER — HEPARIN (PORCINE) IN NACL 100-0.45 UNIT/ML-% IJ SOLN
1100.0000 [IU]/h | INTRAMUSCULAR | Status: DC
  Administered 2014-07-13: 800 [IU]/h via INTRAVENOUS
  Administered 2014-07-14: 1100 [IU]/h via INTRAVENOUS
  Filled 2014-07-13 (×5): qty 250

## 2014-07-13 MED ORDER — HEPARIN SODIUM (PORCINE) 1000 UNIT/ML DIALYSIS
2500.0000 [IU] | INTRAMUSCULAR | Status: DC | PRN
  Filled 2014-07-13: qty 3

## 2014-07-13 MED ORDER — DARBEPOETIN ALFA 60 MCG/0.3ML IJ SOSY
60.0000 ug | PREFILLED_SYRINGE | INTRAMUSCULAR | Status: DC
  Administered 2014-07-17: 60 ug via INTRAVENOUS
  Filled 2014-07-13: qty 0.3

## 2014-07-13 MED ORDER — SODIUM CHLORIDE 0.9 % IV SOLN: 100.0000 mL | INTRAVENOUS | Status: DC | PRN

## 2014-07-13 MED ORDER — PENTAFLUOROPROP-TETRAFLUOROETH EX AERO: 1.0000 "application " | INHALATION_SPRAY | CUTANEOUS | Status: DC | PRN

## 2014-07-13 MED ORDER — NEPRO/CARBSTEADY PO LIQD
237.0000 mL | ORAL | Status: DC | PRN
Start: 2014-07-13 — End: 2014-07-14

## 2014-07-13 MED ORDER — HEPARIN SODIUM (PORCINE) 1000 UNIT/ML DIALYSIS
1000.0000 [IU] | INTRAMUSCULAR | Status: DC | PRN
  Filled 2014-07-13: qty 1

## 2014-07-13 MED ORDER — WARFARIN SODIUM 6 MG PO TABS
6.0000 mg | ORAL_TABLET | Freq: Once | ORAL | Status: AC
  Administered 2014-07-13: 6 mg via ORAL
  Filled 2014-07-13: qty 1

## 2014-07-13 MED ORDER — ALTEPLASE 2 MG IJ SOLR
2.0000 mg | Freq: Once | INTRAMUSCULAR | Status: AC | PRN
  Filled 2014-07-13: qty 2

## 2014-07-13 MED ORDER — RENA-VITE PO TABS
1.0000 | ORAL_TABLET | Freq: Every day | ORAL | Status: DC
  Administered 2014-07-13 – 2014-07-16 (×4): 1 via ORAL
  Filled 2014-07-13 (×6): qty 1

## 2014-07-13 MED ORDER — LIDOCAINE-PRILOCAINE 2.5-2.5 % EX CREA: 1.0000 "application " | TOPICAL_CREAM | CUTANEOUS | Status: DC | PRN

## 2014-07-13 NOTE — Progress Notes (Signed)
ANTICOAGULATION CONSULT NOTE - Follow Up Consult  Pharmacy Consult for warfarin and heparin Indication: hx recent DVT  Allergies  Allergen Reactions  . Nsaids Other (See Comments)    REACTION: Avoid NSAIDS(renal failure)    Patient Measurements: Height:  (162.6 cm) Weight: 114 lb 10.2 oz (52 kg) IBW/kg (Calculated) : 54.7  Vital Signs: Temp: 97.5 F (36.4 C) (01/03 1104) Temp Source: Oral (01/03 1104) BP: 106/53 mmHg (01/03 1104) Pulse Rate: 80 (01/03 1104)  Labs:  Recent Labs  07/10/14 2307 07/11/14 0655 07/12/14 0528 07/12/14 1032 07/13/14 0858  HGB 8.9* 7.8*  --  9.9*  --   HCT 29.5* 24.5*  --  32.3*  --   PLT 269 246  --  347  --   LABPROT  --  24.7* 21.1*  --  17.3*  INR  --  2.21* 1.80*  --  1.40  CREATININE 1.47* 1.89*  --  3.41*  --   TROPONINI <0.03  --   --   --   --     Estimated Creatinine Clearance: 11 mL/min (by C-G formula based on Cr of 3.41).  Assessment: 81 y/o female with ESRD on warfarin PTA for recent DVT (06/06/14). INR was SUPRAtherapeutic on admission at 4.23 on 12/30.   INR is now SUBtherapeutic and has trended down to 1.4 today. Spoke with Dr. Radonna Ricker and decided to bridge with IV heparin until INR >2. No bleeding noted, Hb has improved to 9.9, platelets are normal.   Goal of Therapy:  INR 2-3 Heparin level 0.3-0.7 units/ml Monitor platelets by anticoagulation protocol: Yes   Plan:  - Heparin drip at 800 units/hr with no bolus - Warfarin 6 mg PO tonight - 8 hr heparin level - INR, CBC, heparin level daily - Monitor for s/sx of bleeding  [facility], [place].D., BCPS Clinical Pharmacist Pager: [phone] 07/13/2014 12:58 PM

## 2014-07-13 NOTE — Progress Notes (Addendum)
[facility] PROGRESS NOTE  Assessment/Plan: HCAP (healthcare-associated pneumonia)/Pleural effusion on left: - Started empirically on IV vancomycin and cefepime. - Will cont on IV antibiotics one more day and then de-escalate to levaquin. - IR performed thoracocentesis on 1.2.2015, showed numerous WBC, cultures still pending.  Anemia of renal disease/ESRD on dialysis: - per renal.    Essential  Hypertension - cont midodrine.  Dementia with behavioral disturbance - cont medications.   H/o DVT: - INR Sub therapeutic. - cont coumadin, start heparin.  GERD - PPI.    Code Status: DNR Family Communication: Daughters at bedside Disposition Plan: To be determined-transfer to med surg DVT prophylaxis: INR subtherapeutic   Consultants:  renal  Procedures:  CXR  Ct chest  Antibiotics:  vanc and cefepime  HPI/Subjective: No complains  Objective: Filed Vitals:   07/13/14 0800 07/13/14 0828 07/13/14 0901 07/13/14 0930  BP: 100/51 97/46 104/60 83/49  Pulse: 73 73 78 79  Temp:      TempSrc:      Resp: 17     Height:      Weight:      SpO2:        Intake/Output Summary (Last 24 hours) at 07/13/14 0937 Last data filed at 07/12/14 1700  Gross per 24 hour  Intake    360 ml  Output      0 ml  Net    360 ml   Filed Weights   07/11/14 0223 07/11/14 2130 07/13/14 0720  Weight: 52.802 kg (116 lb 6.5 oz) 53.298 kg (117 lb 8 oz) 51.7 kg (113 lb 15.7 oz)    Exam:  General: Alert, awake, oriented x2, in no acute distress.  HEENT: No bruits, no goiter.  Heart: Regular rate and rhythm. Lungs: Good air movement, clear Abdomen: Soft, nontender, nondistended, positive bowel sounds.     Data Reviewed: Basic Metabolic Panel:  Recent Labs Lab 07/08/14 0500 07/10/14 2307 07/11/14 0655 07/12/14 1032  NA 134* 136 137 136  K 3.3* 4.3 3.9 4.1  CL 100 99 100 100  CO2 26 26 26 25   GLUCOSE 60* 69* 72 61*  BUN 13 <5* <5* 12  CREATININE 3.14* 1.47* 1.89*  3.41*  CALCIUM 7.9* 7.6* 7.6* 8.3*  MG  --   --  1.6  --   PHOS 2.9  --  1.9*  --    Liver Function Tests:  Recent Labs Lab 07/08/14 0500 07/10/14 2307 07/11/14 0655  AST  --  29 19  ALT  --  14 11  ALKPHOS  --  63 53  BILITOT  --  1.2 0.9  PROT  --  6.0 5.4*  ALBUMIN 1.8* 2.1* 1.9*    Recent Labs Lab 07/10/14 2307  LIPASE 33   No results for input(s): AMMONIA in the last 168 hours. CBC:  Recent Labs Lab 07/07/14 0805 07/08/14 0500 07/10/14 2307 07/11/14 0655 07/12/14 1032  WBC 10.8* 10.7* 12.0* 10.8* 13.7*  NEUTROABS  --   --  9.7* 8.2*  --   HGB 8.7* 7.9* 8.9* 7.8* 9.9*  HCT 27.4* 25.6* 29.5* 24.5* 32.3*  MCV 100.0 98.5 99.7 99.2 102.5*  PLT 241 247 269 246 347   Cardiac Enzymes:  Recent Labs Lab 07/10/14 2307  TROPONINI <0.03   BNP (last 3 results) No results for input(s): PROBNP in the last 8760 hours. CBG:  Recent Labs Lab 07/06/14 1234  GLUCAP 76    Recent Results (from the past 240 hour(s))  Blood culture (routine x  2)     Status: None (Preliminary result)   Collection Time: 07/05/14 11:08 PM  Result Value Ref Range Status   Specimen Description BLOOD RIGHT ARM  Final   Special Requests BOTTLES DRAWN AEROBIC AND ANAEROBIC 5CC EA  Final   Culture   Final           BLOOD CULTURE RECEIVED NO GROWTH TO DATE CULTURE WILL BE HELD FOR 5 DAYS BEFORE ISSUING A FINAL NEGATIVE REPORT Performed at [facility]    Report Status PENDING  Incomplete  Blood culture (routine x 2)     Status: None (Preliminary result)   Collection Time: 07/05/14 11:18 PM  Result Value Ref Range Status   Specimen Description BLOOD RIGHT WRIST  Final   Special Requests BOTTLES DRAWN AEROBIC ONLY 3CC  Final   Culture   Final           BLOOD CULTURE RECEIVED NO GROWTH TO DATE CULTURE WILL BE HELD FOR 5 DAYS BEFORE ISSUING A FINAL NEGATIVE REPORT Performed at [facility]    Report Status PENDING  Incomplete  MRSA PCR Screening     Status: None    Collection Time: 07/06/14 12:11 AM  Result Value Ref Range Status   MRSA by PCR NEGATIVE NEGATIVE Final    Comment:        The GeneXpert MRSA Assay (FDA approved for NASAL specimens only), is one component of a comprehensive MRSA colonization surveillance program. It is not intended to diagnose MRSA infection nor to guide or monitor treatment for MRSA infections.      Studies: Dg Chest 1 View  07/12/2014   CLINICAL DATA:  Evaluation for status post left thoracentesis  EXAM: CHEST - 1 VIEW  COMPARISON:  07/10/2014  FINDINGS: Stable cardiac enlargement and right central line. Right lung is clear.  Small left effusion decreased in size when compared to prior study. No pneumothorax. Mild persistent left lower lobe atelectasis.  IMPRESSION: No pneumothorax status post left thoracentesis.   Electronically Signed   By: Esperanza Heir M.D.   On: 07/12/2014 10:37   Ct Chest Wo Contrast  07/11/2014   CLINICAL DATA:  Persistent shortness of breath.  EXAM: CT CHEST WITHOUT CONTRAST  TECHNIQUE: Multidetector CT imaging of the chest was performed following the standard protocol without IV contrast.  COMPARISON:  07/06/2014  FINDINGS: Chest wall: No breast masses, supraclavicular or axillary lymphadenopathy. A right IJ dialysis catheter is stable. The bony thorax is intact.  Mediastinum: The heart is borderline enlarged but stable. No pericardial effusion. No mediastinal or hilar mass or adenopathy. Stable coronary artery and aortic calcifications. The pulmonary arteries are enlarged suggesting pulmonary hypertension.  Lungs: There is persistent left lower lobe airspace consolidation with air bronchograms and cystic changes. There is also persistent loculated pleural fluid collections. Could not exclude pneumonia, early pulmonary abscess and empyema. Stable emphysematous changes. No worrisome pulmonary nodules. Stable right basilar scarring changes.  Upper abdomen: Cholelithiasis and probable chronic left UPJ  obstruction.  IMPRESSION: Persistent left lower lobe process suspicious for pneumonia with possible early lung abscess and empyema.  Enlarged pulmonary artery suggesting pulmonary hypertension.  Emphysematous changes and pulmonary scarring.  Cholelithiasis and probable chronic left UPJ obstruction   Electronically Signed   By: Loralie Champagne M.D.   On: 07/11/2014 12:49   US Thoracentesis Asp Pleural Space W/img Guide  07/12/2014   CLINICAL DATA:  Shortness of breath. Request diagnostic thoracentesis.  EXAM: ULTRASOUND GUIDED LEFT THORACENTESIS  COMPARISON:  None.  PROCEDURE: An ultrasound guided thoracentesis was thoroughly discussed with the patient and questions answered. The benefits, risks, alternatives and complications were also discussed. The patient understands and wishes to proceed with the procedure. Written consent was obtained.  Ultrasound was performed to localize and mark an adequate pocket of fluid in the left chest. The area was then prepped and draped in the normal sterile fashion. 1% Lidocaine was used for local anesthesia. Under ultrasound guidance a 19 gauge Yueh catheter was introduced. Thoracentesis was performed. The catheter was removed and a dressing applied.  COMPLICATIONS: none, post procedural chest x-ray negative for pneumothorax.  FINDINGS: A total of approximately 160 mL of cloudy, blood-tinged fluid was removed. A fluid sample wassent for laboratory analysis.  IMPRESSION: Successful ultrasound guided left thoracentesis yielding 160 mL of pleural fluid.  Read by: Brayton El PA-C   Electronically Signed   By: Irish Lack M.D.   On: 07/12/2014 10:39    Scheduled Meds: . ceFEPime (MAXIPIME) IV  2 g Intravenous Q T,Th,Sa-HD  . cinacalcet  30 mg Oral Q breakfast  . guaiFENesin  600 mg Oral BID  . midodrine  10 mg Oral TID WC  . multivitamin  1 tablet Oral QHS  . vancomycin  500 mg Intravenous Q T,Th,Sa-HD  . Warfarin - Pharmacist Dosing Inpatient   Does not apply q1800     Continuous Infusions:    [name]  [facility] Pager [phone]. If 8PM-8AM, please contact night-coverage at [email or web address], password [facility] 07/13/2014, 9:37 AM  LOS: 3 days

## 2014-07-13 NOTE — Procedures (Signed)
I was present at this dialysis session, have reviewed the session itself and made  appropriate changes  Vinson Moselle MD (pgr) [phone]    ([phone] 07/13/2014, 10:38 AM

## 2014-07-13 NOTE — Progress Notes (Signed)
Subjective:  On hd, no sob, pleasantly confused baseline  Objective Vital signs in last 24 hours: Filed Vitals:   07/13/14 0611 07/13/14 0720 07/13/14 0730 07/13/14 0800  BP: 99/52 91/55 106/60 100/51  Pulse: 69 71 71 73  Temp: 98.1 F (36.7 C) 97.9 F (36.6 C)    TempSrc: Oral Oral    Resp: Height:      Weight:  51.7 kg (113 lb 15.7 oz)    SpO2: 100% 96%     Weight change:   Physical Exam: General: Alert, NAD, baseline MS pleasantly confused and talkative Heart: RRR, no mur, rub or gallop Lungs: L sided BS decr slightly otherwise Clear Abdomen: bs pos. Soft NT, ND Extremities:no pedal edema  Dialysis Access: R IJ perm cath patent on hd/ Lua avf hematoma nontender/ slowly decr swelling and erythema+bruit    OP HD: TTS , [facility],4h 4K/2.25 Bath 53.5kgheparin 5700 units bolus R IJ cath / LUA AVF (resting d/t severe infiltration, tortuous course) Aranesp 40 g weekly  Problem/Plan: 1. Dyspnea / L pleural effusion / HCAP - on emp abx, s/p thoracentesis by IR 07/12/14 2. ESRD on HD, TTS ( next hd tues) k 4.1/ sop avf infiltra with perm cath in. 3. Dementia - pleasantly confused baseline / Last Admit Family stated they would accompany /sit with pt op hd if needed 4. Hypotension, chronic on midodrine 5. Anemia,  HGB 9.9 cont aranesp 6. Volume - appears  euvolemic with  no excess on exam/ below  Prior op edw 7. 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 HO LLE dvt - on coumadin RECENTLY HELD  With INR ^  last admit  1.4 this am  RX per admit team (Dr. Concepcion Elk follows op INR.)  Lenny Pastel, PA-C [facility] Beeper [phone] 07/13/2014,8:23 AM  LOS: 3 days   Pt seen, examined and agree w A/P as above.  Vinson Moselle MD pager [phone]    cell [phone] 07/13/2014, 10:39 AM    Labs: Basic Metabolic Panel:  Recent Labs Lab 07/08/14 0500 07/10/14 2307 07/11/14 0655 07/12/14 1032  NA 134* 136 137 136  K 3.3* 4.3 3.9 4.1  CL 100  99 100 100  CO2 GLUCOSE 60* 69* 72 61*  BUN 13 <5* <5* 12  CREATININE 3.14* 1.47* 1.89* 3.41*  CALCIUM 7.9* 7.6* 7.6* 8.3*  PHOS 2.9  --  1.9*  --    Liver Function Tests:  Recent Labs Lab 07/08/14 0500 07/10/14 2307 07/11/14 0655  AST  --  29 19  ALT  --  14 11  ALKPHOS  --  63 53  BILITOT  --  1.2 0.9  PROT  --  6.0 5.4*  ALBUMIN 1.8* 2.1* 1.9*    Recent Labs Lab 07/10/14 2307  LIPASE 33   CBC:  Recent Labs Lab 07/07/14 0805 07/08/14 0500 07/10/14 2307 07/11/14 0655 07/12/14 1032  WBC 10.8* 10.7* 12.0* 10.8* 13.7*  NEUTROABS  --   --  9.7* 8.2*  --   HGB 8.7* 7.9* 8.9* 7.8* 9.9*  HCT 27.4* 25.6* 29.5* 24.5* 32.3*  MCV 100.0 98.5 99.7 99.2 102.5*  PLT 241 247 269 246 347   Cardiac Enzymes:  Recent Labs Lab 07/10/14 2307  TROPONINI <0.03   CBG:  Recent Labs Lab 07/06/14 1234  GLUCAP 76    Studies/Results: Dg Chest 1 View  07/12/2014   CLINICAL DATA:  Evaluation for status post  left thoracentesis  EXAM: CHEST - 1 VIEW  COMPARISON:  07/10/2014  FINDINGS: Stable cardiac enlargement and right central line. Right lung is clear.  Small left effusion decreased in size when compared to prior study. No pneumothorax. Mild persistent left lower lobe atelectasis.  IMPRESSION: No pneumothorax status post left thoracentesis.   Electronically Signed   By: Esperanza Heir M.D.   On: 07/12/2014 10:37   Ct Chest Wo Contrast  07/11/2014   CLINICAL DATA:  Persistent shortness of breath.  EXAM: CT CHEST WITHOUT CONTRAST  TECHNIQUE: Multidetector CT imaging of the chest was performed following the standard protocol without IV contrast.  COMPARISON:  07/06/2014  FINDINGS: Chest wall: No breast masses, supraclavicular or axillary lymphadenopathy. A right IJ dialysis catheter is stable. The bony thorax is intact.  Mediastinum: The heart is borderline enlarged but stable. No pericardial effusion. No mediastinal or hilar mass or adenopathy. Stable coronary artery and  aortic calcifications. The pulmonary arteries are enlarged suggesting pulmonary hypertension.  Lungs: There is persistent left lower lobe airspace consolidation with air bronchograms and cystic changes. There is also persistent loculated pleural fluid collections. Could not exclude pneumonia, early pulmonary abscess and empyema. Stable emphysematous changes. No worrisome pulmonary nodules. Stable right basilar scarring changes.  Upper abdomen: Cholelithiasis and probable chronic left UPJ obstruction.  IMPRESSION: Persistent left lower lobe process suspicious for pneumonia with possible early lung abscess and empyema.  Enlarged pulmonary artery suggesting pulmonary hypertension.  Emphysematous changes and pulmonary scarring.  Cholelithiasis and probable chronic left UPJ obstruction   Electronically Signed   By: Loralie Champagne M.D.   On: 07/11/2014 12:49   US Thoracentesis Asp Pleural Space W/img Guide  07/12/2014   CLINICAL DATA:  Shortness of breath. Request diagnostic thoracentesis.  EXAM: ULTRASOUND GUIDED LEFT THORACENTESIS  COMPARISON:  None.  PROCEDURE: An ultrasound guided thoracentesis was thoroughly discussed with the patient and questions answered. The benefits, risks, alternatives and complications were also discussed. The patient understands and wishes to proceed with the procedure. Written consent was obtained.  Ultrasound was performed to localize and mark an adequate pocket of fluid in the left chest. The area was then prepped and draped in the normal sterile fashion. 1% Lidocaine was used for local anesthesia. Under ultrasound guidance a 19 gauge Yueh catheter was introduced. Thoracentesis was performed. The catheter was removed and a dressing applied.  COMPLICATIONS: none, post procedural chest x-ray negative for pneumothorax.  FINDINGS: A total of approximately 160 mL of cloudy, blood-tinged fluid was removed. A fluid sample wassent for laboratory analysis.  IMPRESSION: Successful ultrasound  guided left thoracentesis yielding 160 mL of pleural fluid.  Read by: Brayton El PA-C   Electronically Signed   By: Irish Lack M.D.   On: 07/12/2014 10:39   Medications:   . ceFEPime (MAXIPIME) IV  2 g Intravenous Q T,Th,Sa-HD  . cinacalcet  30 mg Oral Q breakfast  . guaiFENesin  600 mg Oral BID  . midodrine  10 mg Oral TID WC  . vancomycin  500 mg Intravenous Q T,Th,Sa-HD  . Warfarin - Pharmacist Dosing Inpatient   Does not apply [phone]

## 2014-07-14 LAB — RENAL FUNCTION PANEL
ALBUMIN: 1.9 g/dL — AB (ref 3.5–5.2)
Anion gap: 4 — ABNORMAL LOW (ref 5–15)
BUN: 7 mg/dL (ref 6–23)
CHLORIDE: 105 meq/L (ref 96–112)
CO2: 30 mmol/L (ref 19–32)
Calcium: 7.4 mg/dL — ABNORMAL LOW (ref 8.4–10.5)
Creatinine, Ser: 2.56 mg/dL — ABNORMAL HIGH (ref 0.50–1.10)
GFR, EST AFRICAN AMERICAN: 19 mL/min — AB (ref >90)
GFR, EST NON AFRICAN AMERICAN: 17 mL/min — AB (ref >90)
GLUCOSE: 67 mg/dL — AB (ref 70–99)
POTASSIUM: 3.8 mmol/L (ref 3.5–5.1)
Phosphorus: 2.2 mg/dL — ABNORMAL LOW (ref 2.3–4.6)
SODIUM: 139 mmol/L (ref 135–145)

## 2014-07-14 LAB — CBC
HCT: 25.5 % — ABNORMAL LOW (ref 36.0–46.0)
Hemoglobin: 7.8 g/dL — ABNORMAL LOW (ref 12.0–15.0)
MCH: 30.8 pg (ref 26.0–34.0)
MCHC: 30.6 g/dL (ref 30.0–36.0)
MCV: 100.8 fL — ABNORMAL HIGH (ref 78.0–100.0)
PLATELETS: 271 10*3/uL (ref 150–400)
RBC: 2.53 MIL/uL — ABNORMAL LOW (ref 3.87–5.11)
RDW: 18.8 % — ABNORMAL HIGH (ref 11.5–15.5)
WBC: 9.6 10*3/uL (ref 4.0–10.5)

## 2014-07-14 LAB — HEPARIN LEVEL (UNFRACTIONATED)
Heparin Unfractionated: 0.18 IU/mL — ABNORMAL LOW (ref 0.30–0.70)
Heparin Unfractionated: 0.31 IU/mL (ref 0.30–0.70)
Heparin Unfractionated: <0.1 IU/mL — ABNORMAL LOW (ref 0.30–0.70)

## 2014-07-14 LAB — PROTIME-INR
INR: 1.78 — ABNORMAL HIGH (ref 0.00–1.49)
Prothrombin Time: 20.9 seconds — ABNORMAL HIGH (ref 11.6–15.2)

## 2014-07-14 LAB — PATHOLOGIST SMEAR REVIEW

## 2014-07-14 MED ORDER — HEPARIN SODIUM (PORCINE) 1000 UNIT/ML DIALYSIS: 1000.0000 [IU] | INTRAMUSCULAR | Status: DC | PRN

## 2014-07-14 MED ORDER — SODIUM CHLORIDE 0.9 % IV SOLN: 100.0000 mL | INTRAVENOUS | Status: DC | PRN

## 2014-07-14 MED ORDER — NEPRO/CARBSTEADY PO LIQD: 237.0000 mL | ORAL | Status: DC | PRN

## 2014-07-14 MED ORDER — LIDOCAINE HCL (PF) 1 % IJ SOLN: 5.0000 mL | INTRAMUSCULAR | Status: DC | PRN

## 2014-07-14 MED ORDER — WARFARIN SODIUM 4 MG PO TABS
4.0000 mg | ORAL_TABLET | Freq: Once | ORAL | Status: AC
  Administered 2014-07-14: 4 mg via ORAL
  Filled 2014-07-14: qty 1

## 2014-07-14 MED ORDER — LIDOCAINE-PRILOCAINE 2.5-2.5 % EX CREA: 1.0000 "application " | TOPICAL_CREAM | CUTANEOUS | Status: DC | PRN

## 2014-07-14 MED ORDER — PENTAFLUOROPROP-TETRAFLUOROETH EX AERO: 1.0000 "application " | INHALATION_SPRAY | CUTANEOUS | Status: DC | PRN

## 2014-07-14 MED ORDER — ALTEPLASE 2 MG IJ SOLR
2.0000 mg | Freq: Once | INTRAMUSCULAR | Status: AC | PRN
  Filled 2014-07-14: qty 2

## 2014-07-14 NOTE — Progress Notes (Signed)
ANTICOAGULATION & ANTIBIOTIC CONSULT NOTE - Follow Up Consult  Pharmacy Consult for Heparin + Warfarin & Vancomycin + Cefepime Indication: hx recent DVT & r/o HCAP  Allergies  Allergen Reactions  . Nsaids Other (See Comments)    REACTION: Avoid NSAIDS(renal failure)    Patient Measurements: Height:  (162.6 cm) Weight: 115 lb 15.4 oz (52.6 kg) IBW/kg (Calculated) : 54.7  Vital Signs: Temp: 97.6 F (36.4 C) (01/04 1015) Temp Source: Oral (01/04 1015) BP: 87/43 mmHg (01/04 1015) Pulse Rate: 73 (01/04 1015)  Labs:  Recent Labs  07/12/14 0528 07/12/14 1032 07/13/14 0858 07/13/14 2328 07/14/14 0553  HGB  --  9.9*  --   --  7.8*  HCT  --  32.3*  --   --  25.5*  PLT  --  347  --   --  271  LABPROT 21.1*  --  17.3*  --  20.9*  INR 1.80*  --  1.40  --  1.78*  HEPARINUNFRC  --   --   --  0.18* 0.31  CREATININE  --  3.41*  --   --  2.56*    Estimated Creatinine Clearance: 14.8 mL/min (by C-G formula based on Cr of 2.56).  Assessment: 81 y/o female with ESRD on warfarin PTA for recent DVT (06/06/14) who continues on a heparin bridge to a therapeutic INR with warfarin. A heparin level this morning after a rate adjustment was therapeutic however drawn early (HL 0.31 << 0.18, goal of 0.3-0.7). INR remains SUBtherapeutic though trending up IINR 1.78 << 1.4, goal of 2-3). Hgb/Hct/Plt have dropped today - RN has alerted MD - no overt s/sx of bleeding can be seen at this time. Will continue to monitor trends and watch for bleeding.   The patient also continues on Vancomycin + Cefepime for r/o HCAP. The patient dialyzed off-schedule on 1/3 however maintenance doses were still given appropriately. Next HD planned for 1/5 - doses remain appropriate for now.   LVQ PTA Vanc 1/1 >> * Total LD of 1g on 1/4, 500 mg MD given post HD on 1/3 * s/p HD on 1/3 (3 hr BFR 400) Cefepime 1/1 >>  1/1 BCx >> 1/2 Pleural fluid >>  Goal of Therapy:  INR 2-3 Heparin level 0.3-0.7  units/ml Monitor platelets by anticoagulation protocol: Yes   Plan:  1. Continue heparin at 950 units/hr (9.5 ml/hr) 2. Warfarin 4 mg x 1 dose at 1800 today 3. Continue Vancomycin 500 mg/HD-TTS 4. Continue Cefepime 2g/HD-TTS 5. Will continue to monitor for any signs/symptoms of bleeding and will follow up with heparin level at 1300 today 6. Will continue to monitor for any signs/symptoms of bleeding and will follow up with PT/INR in the a.m.   Georgina Pillion, PharmD, BCPS Clinical Pharmacist Pager: [phone] 07/14/2014 2:05 PM

## 2014-07-14 NOTE — Progress Notes (Signed)
ANTICOAGULATION CONSULT NOTE - Follow Up Consult  Pharmacy Consult for Heparin   Indication: hx recent DVT   Allergies  Allergen Reactions  . Nsaids Other (See Comments)    REACTION: Avoid NSAIDS(renal failure)    Patient Measurements: Height:  (162.6 cm) Weight: 115 lb 15.4 oz (52.6 kg) IBW/kg (Calculated) : 54.7  Vital Signs: Temp: 97.6 F (36.4 C) (01/04 1015) Temp Source: Oral (01/04 1015) BP: 87/43 mmHg (01/04 1015) Pulse Rate: 73 (01/04 1015)  Labs:  Recent Labs  07/12/14 0528 07/12/14 1032 07/13/14 0858 07/13/14 2328 07/14/14 0553 07/14/14 1445  HGB  --  9.9*  --   --  7.8*  --   HCT  --  32.3*  --   --  25.5*  --   PLT  --  347  --   --  271  --   LABPROT 21.1*  --  17.3*  --  20.9*  --   INR 1.80*  --  1.40  --  1.78*  --   HEPARINUNFRC  --   --   --  0.18* 0.31 <0.10*  CREATININE  --  3.41*  --   --  2.56*  --     Estimated Creatinine Clearance: 14.8 mL/min (by C-G formula based on Cr of 2.56).  Assessment: 81 y/o female with ESRD on warfarin PTA for recent DVT (06/06/14) who continues on a heparin bridge to a therapeutic INR with warfarin. A heparin level this morning after a rate adjustment was therapeutic however drawn early (HL 0.31 << 0.18, goal of 0.3-0.7). INR remains SUBtherapeutic though trending up IINR 1.78 << 1.4, goal of 2-3). Hgb/Hct/Plt have dropped today - RN has alerted MD - no overt s/sx of bleeding can be seen at this time. Will continue to monitor trends and watch for bleeding.   Follow-up heparin level was undetectable on heparin 950 units/hr. No bleeding is noted. However, the nurse did note that the patient's line has been occluding off and on multiple times today with the most recent within the last 2 hours.   Goal of Therapy:  INR 2-3 Heparin level 0.3-0.7 units/ml Monitor platelets by anticoagulation protocol: Yes   Plan:  - Increase heparin to 1100 units/hr (11 ml/hr) - Check 8h HL - Daily HL/CBC - Monitor for any  signs/symptoms of bleeding    Arlean Hopping. Newman Pies, PharmD Clinical Pharmacist Pager [phone]  07/14/2014 3:40 PM

## 2014-07-14 NOTE — Care Management Note (Signed)
CARE MANAGEMENT NOTE 07/14/2014  Patient:  [name], [name]   Account Number:  [id number]  Date Initiated:  07/14/2014  Documentation initiated by:  Hermione Havlicek  Subjective/Objective Assessment:   CM following for progression and d/c planning.     Action/Plan:   07/14/2014 Spoke with pt daughter  who plans to bring pt home again, has [place] for [facility] services, this CM will ask for [facility] services for freq visits and closer monitoring due to pt repeated visit.   Anticipated DC Date:  07/16/2014   Anticipated DC Plan:  HOME W HOME HEALTH SERVICES         Choice offered to / List presented to:          [facility] arranged  HH-1 RN  HH-2 PT  HH-6 SOCIAL WORKER      HH agency  [facility]   Status of service:  Completed, signed off Medicare Important Message given?  YES (If response is "NO", the following Medicare IM given date fields will be blank) Date Medicare IM given:  07/14/2014 Medicare IM given by:  Roopa Graver Date Additional Medicare IM given:   Additional Medicare IM given by:    Discharge Disposition:    Per UR Regulation:    If discussed at Long Length of Stay Meetings, dates discussed:    Comments:

## 2014-07-14 NOTE — Progress Notes (Signed)
ANTICOAGULATION CONSULT NOTE - Follow Up Consult  Pharmacy Consult for warfarin and heparin Indication: hx recent DVT  Allergies  Allergen Reactions  . Nsaids Other (See Comments)    REACTION: Avoid NSAIDS(renal failure)    Patient Measurements: Height:  (162.6 cm) Weight: 115 lb 15.4 oz (52.6 kg) IBW/kg (Calculated) : 54.7  Vital Signs: Temp: 98.4 F (36.9 C) (01/03 2110) Temp Source: Oral (01/03 2110) BP: 96/54 mmHg (01/03 2110) Pulse Rate: 77 (01/03 2110)  Labs:  Recent Labs  07/11/14 0655 07/12/14 0528 07/12/14 1032 07/13/14 0858 07/13/14 2328  HGB 7.8*  --  9.9*  --   --   HCT 24.5*  --  32.3*  --   --   PLT 246  --  347  --   --   LABPROT 24.7* 21.1*  --  17.3*  --   INR 2.21* 1.80*  --  1.40  --   HEPARINUNFRC  --   --   --   --  0.18*  CREATININE 1.89*  --  3.41*  --   --     Estimated Creatinine Clearance: 11.1 mL/min (by C-G formula based on Cr of 3.41).  Assessment: 81 y/o female with ESRD on warfarin PTA for recent DVT (06/06/14). INR was SUPRAtherapeutic on admission at 4.23 on 12/30.   Currently on warfarin with heparin bridge for sub-therapeutic INR. HL remains sub-therapeutic at 0.18 but is trending up. RN reports no s/s of bleeding.   Goal of Therapy:  INR 2-3 Heparin level 0.3-0.7 units/ml Monitor platelets by anticoagulation protocol: Yes   Plan:  - Increase Heparin drip to 950 units/hr with no bolus - 8 hr heparin level at 0930 AM  - INR, CBC, heparin level daily - Monitor for s/sx of bleeding  Vinnie Level, PharmD., BCPS Clinical Pharmacist Pager [phone]

## 2014-07-14 NOTE — Progress Notes (Addendum)
Patient ID: [name], female   DOB: 08/19/1932, 81 y.o.   MRN: [id number] [facility] PROGRESS NOTE  [name] QMV:[id number] DOB: Nov 12, 1932 DOA: 07/10/2014 PCP: Dorrene German, MD  Brief narrative: 81 y.o. female with past medical history of ESRD on hemodialysis, hypertension, dyslipidemia, GERD, gout. Patient presented to [facility] ED with complaints of shortness of breath with no associated cough, fevers or chest pain. Patient is from home and independent in most activities of daily living.  She was found to have HCAP and was started on broad spectrum antibiotics. She has left pleural effusion and has underwent left thoracentesis 07/12/2014 with 160 cc fluid removed.   Assessment and Plan:    Principal Problem: HCAP (healthcare-associated pneumonia) / Pleural effusion on left / leukocytosis   Pt started on broad spectrum antibiotics, vanco and cefepime   Pt is status post left thoracentesis 07/12/2014 with 160 cc fluid removed; fluid sent for analysis. Will follow up on culture results.   BLood cultures to date show no growth   Active Problems: Anemia of chronic disease / anemia of renal disease   Secondary to ESRD  Hemoglobin is 7.8 today.  Pt takes aransep with HD.   ESRD on HD  Management per renal  Continue cinacalet   Essential Hypertension  Continue midodrine   Dementia with behavioral disturbance  Stable.    H/o DVT:  Pt is on anticoagulation with coumadin  Dosing per pharmacy   INR 1.78 this am.     DVT prophylaxis  On anticoagulation with coumadin   Code Status: DNR/DNI Family Communication: family not at the bedside this am Disposition Plan: remains inpatient    IV Access:   Peripheral IV  Procedures and diagnostic studies:    No results found.  Medical Consultants:   Nephrology   Other Consultants:   Physical therapy Nutrition    Anti-Infectives:   Vancomycin 07/12/2014 --> Cefepime 07/12/2014 -->  [name] Presto, MD  [facility] Pager [phone]  If 7PM-7AM, please contact night-coverage [email or web address] Password [email or web address] 07/14/2014, 2:27 PM   LOS: 4 days   HPI/Subjective: No events overnight.   Objective: Filed Vitals:   07/13/14 1820 07/13/14 2110 07/14/14 0554 07/14/14 1015  BP: 116/50 96/54 92/48  87/43  Pulse: 76 77 66 73  Temp: 98.4 F (36.9 C) 98.4 F (36.9 C) 98.1 F (36.7 C) 97.6 F (36.4 C)  TempSrc: Oral Oral Oral Oral  Resp: Height:      Weight:  52.6 kg (115 lb 15.4 oz)    SpO2: 100% 100% 100% 100%    Intake/Output Summary (Last 24 hours) at 07/14/14 1427 Last data filed at 07/14/14 1334  Gross per 24 hour  Intake    300 ml  Output      0 ml  Net    300 ml    Exam:   General:  Pt is alert, not in acute distress  Cardiovascular: Regular rate and rhythm, S1/S2 (+)  Respiratory: Clear to auscultation bilaterally, no wheezing, no crackles, no rhonchi  Abdomen: Soft, non tender, non distended, bowel sounds present, no guarding  Extremities: Pulses DP and PT palpable bilaterally  Neuro: Grossly nonfocal  Data Reviewed: Basic Metabolic Panel:  Recent Labs Lab 07/08/14 0500 07/10/14 2307 07/11/14 0655 07/12/14 1032 07/14/14 0553  NA 134* 136 137 136 139  K 3.3* 4.3 3.9 4.1 3.8  CL 100 99 100 100 105  CO2 GLUCOSE 60* 69* 72 61*  67*  BUN 13 <5* <5* 12 7  CREATININE 3.14* 1.47* 1.89* 3.41* 2.56*  CALCIUM 7.9* 7.6* 7.6* 8.3* 7.4*  MG  --   --  1.6  --   --   PHOS 2.9  --  1.9*  --  2.2*   Liver Function Tests:  Recent Labs Lab 07/08/14 0500 07/10/14 2307 07/11/14 0655 07/14/14 0553  AST  --  29 19  --   ALT  --  14 11  --   ALKPHOS  --  63 53  --   BILITOT  --  1.2 0.9  --   PROT  --  6.0 5.4*  --   ALBUMIN 1.8* 2.1* 1.9* 1.9*    Recent Labs Lab 07/10/14 2307  LIPASE 33   No results for input(s): AMMONIA in the last 168 hours. CBC:  Recent Labs Lab 07/08/14 0500 07/10/14 2307 07/11/14 0655 07/12/14 1032  07/14/14 0553  WBC 10.7* 12.0* 10.8* 13.7* 9.6  NEUTROABS  --  9.7* 8.2*  --   --   HGB 7.9* 8.9* 7.8* 9.9* 7.8*  HCT 25.6* 29.5* 24.5* 32.3* 25.5*  MCV 98.5 99.7 99.2 102.5* 100.8*  PLT 247 269 246 347 271   Cardiac Enzymes:  Recent Labs Lab 07/10/14 2307  TROPONINI <0.03   BNP: Invalid input(s): POCBNP CBG: No results for input(s): GLUCAP in the last 168 hours.  Blood culture (routine x 2)     Status: None   Collection Time: 07/05/14 11:08 PM  Result Value Ref Range Status   Specimen Description BLOOD RIGHT ARM  Final   Special Requests BOTTLES DRAWN AEROBIC AND ANAEROBIC 5CC EA  Final   Culture   Final    NO GROWTH 5 DAYS   Report Status 07/13/2014 FINAL  Final  Blood culture (routine x 2)     Status: None   Collection Time: 07/05/14 11:18 PM  Result Value Ref Range Status   Specimen Description BLOOD RIGHT WRIST  Final   Special Requests BOTTLES DRAWN AEROBIC ONLY 3CC  Final   Culture   Final    NO GROWTH 5 DAYS   Report Status 07/13/2014 FINAL  Final  MRSA PCR Screening     Status: None   Collection Time: 07/06/14 12:11 AM  Result Value Ref Range Status   MRSA by PCR NEGATIVE NEGATIVE Final  Culture, blood (routine x 2) Call MD if unable to obtain prior to antibiotics being given     Status: None (Preliminary result)   Collection Time: 07/11/14  9:30 AM  Result Value Ref Range Status   Specimen Description BLOOD RIGHT HAND  Final   Special Requests BOTTLES DRAWN AEROBIC AND ANAEROBIC 10CC  Final   Culture   Final           BLOOD CULTURE RECEIVED NO GROWTH TO DATE    Report Status PENDING  Incomplete  Culture, blood (routine x 2) Call MD if unable to obtain prior to antibiotics being given     Status: None (Preliminary result)   Collection Time: 07/11/14  9:39 AM  Result Value Ref Range Status   Specimen Description BLOOD RIGHT HAND  Final   Special Requests BOTTLES DRAWN AEROBIC ONLY 10CC  Final   Culture   Final           BLOOD CULTURE RECEIVED NO GROWTH  TO DATE   Report Status PENDING  Incomplete  Body fluid culture     Status: None (Preliminary result)   Collection Time:  07/12/14  9:52 AM  Result Value Ref Range Status   Specimen Description PLEURAL  Final   Special Requests NONE  Final   Gram Stain   Final    RARE WBC PRESENT, PREDOMINANTLY MONONUCLEAR NO ORGANISMS SEEN Performed at [facility]    Culture   Final    NO GROWTH 2 DAYS   Report Status PENDING  Incomplete     Scheduled Meds: . ceFEPime (MAXIPIME)  2 g Intravenous Q T,Th,Sa-HD  . cinacalcet  30 mg Oral Q breakfast  .  darbepoetin (ARANESP)  60 mcg Intravenous Q Thu-HD  . guaiFENesin  600 mg Oral BID  . midodrine  10 mg Oral TID WC  . multivitamin  1 tablet Oral QHS  . vancomycin  500 mg Intravenous Q T,Th,Sa-HD  . warfarin  4 mg Oral ONCE-1800   Continuous Infusions: . heparin 950 Units/hr (07/14/14 0317)

## 2014-07-14 NOTE — Progress Notes (Signed)
Patient complains of "not being able to hear out of the right ear". She and family states its been going on off and on since admission. Told her I would leave a note for the MD. Please address.

## 2014-07-14 NOTE — Progress Notes (Signed)
Subjective:   Pleasantly confused. No complaints, wants to go home to watch TV  Objective Filed Vitals:   07/13/14 1104 07/13/14 1820 07/13/14 2110 07/14/14 0554  BP: 106/53 116/50 96/54 92/48   Pulse: 80 76 77 66  Temp: 97.5 F (36.4 C) 98.4 F (36.9 C) 98.4 F (36.9 C) 98.1 F (36.7 C)  TempSrc: Oral Oral Oral Oral  Resp: Height:      Weight: 52 kg (114 lb 10.2 oz)  52.6 kg (115 lb 15.4 oz)   SpO2: 100% 100% 100% 100%   Physical Exam General: Alert, no acute distress. Pleasantly confused.  Heart: RRR.  Lungs: CTA, unlabored. L base dim Abdomen: soft, nontender +BS Extremities: no LE edema Dialysis Access: R IJ cath. L AVF +b/t. Still with swelling and erythema. No tenderness  HD: TTS , [facility],4h 4K/2.25 Bath 53.5kgheparin 5700 units bolus R IJ cath / LUA AVF (resting d/t severe infiltration, tortuous course) Aranesp 40 g weekly  Assessment/Plan: 1. SOB/ L pleural effusion/ HCAP- s/p thoracentesis by IR 07/12/14. Vanc and cefepime. Afebrile.  2. ESRD - TTS [facility], using cath. K+ 3.8 3. Anemia - hgb 7.8, cont ESA weekly.  4. Secondary hyperparathyroidism - Ca+ 7.4/corrected 9.1 5. Hypotension/volume -  92/48 on Midodrine, slightly under edw 6. Nutrition - alb 1.9, nepro, multivit. Renal diet 7. LLE DVT - INR 1.78. On coumadin and hep gtt.- Dr. Concepcion Elk follows op INR 8. Dementia- at baseline  [name] Duhamel, NP [employer] [phone] 07/14/2014,9:02 AM  LOS: 4 days   I have seen and examined this patient and agree with plan per [name].  Pt voices no new CO except she wants to go home.  Still on heparin gtt and IV AB.  She could be switched to PO AB if still feel the need for them.  Plan HD tomorrow. Jakelyn Squyres T,MD 07/14/2014 9:49 AM Additional Objective Labs: Basic Metabolic Panel:  Recent Labs Lab 07/08/14 0500  07/11/14 0655 07/12/14 1032 07/14/14 0553  NA 134*  < > 137 136 139  K 3.3*  < > 3.9 4.1 3.8  CL 100   < > 100 100 105  CO2 26  < > GLUCOSE 60*  < > 72 61* 67*  BUN 13  < > <5* 12 7  CREATININE 3.14*  < > 1.89* 3.41* 2.56*  CALCIUM 7.9*  < > 7.6* 8.3* 7.4*  PHOS 2.9  --  1.9*  --  2.2*  < > = values in this interval not displayed. Liver Function Tests:  Recent Labs Lab 07/10/14 2307 07/11/14 0655 07/14/14 0553  AST 29 19  --   ALT 14 11  --   ALKPHOS 63 53  --   BILITOT 1.2 0.9  --   PROT 6.0 5.4*  --   ALBUMIN 2.1* 1.9* 1.9*    Recent Labs Lab 07/10/14 2307  LIPASE 33   CBC:  Recent Labs Lab 07/08/14 0500 07/10/14 2307 07/11/14 0655 07/12/14 1032 07/14/14 0553  WBC 10.7* 12.0* 10.8* 13.7* 9.6  NEUTROABS  --  9.7* 8.2*  --   --   HGB 7.9* 8.9* 7.8* 9.9* 7.8*  HCT 25.6* 29.5* 24.5* 32.3* 25.5*  MCV 98.5 99.7 99.2 102.5* 100.8*  PLT 247 269 246 347 271   Blood Culture    Component Value Date/Time   SDES PLEURAL 07/12/2014 0952   SPECREQUEST NONE 07/12/2014 0952   CULT  07/12/2014 0952    NO  GROWTH 1 DAY Performed at [facility]    REPTSTATUS PENDING 07/12/2014 9604    Cardiac Enzymes:  Recent Labs Lab 07/10/14 2307  TROPONINI <0.03   CBG: No results for input(s): GLUCAP in the last 168 hours. Iron Studies: No results for input(s): IRON, TIBC, TRANSFERRIN, FERRITIN in the last 72 hours. @ Studies/Results: Dg Chest 1 View  07/12/2014   CLINICAL DATA:  Evaluation for status post left thoracentesis  EXAM: CHEST - 1 VIEW  COMPARISON:  07/10/2014  FINDINGS: Stable cardiac enlargement and right central line. Right lung is clear.  Small left effusion decreased in size when compared to prior study. No pneumothorax. Mild persistent left lower lobe atelectasis.  IMPRESSION: No pneumothorax status post left thoracentesis.   Electronically Signed   By: Esperanza Heir M.D.   On: 07/12/2014 10:37   US Thoracentesis Asp Pleural Space W/img Guide  07/12/2014   CLINICAL DATA:  Shortness of breath. Request diagnostic thoracentesis.   EXAM: ULTRASOUND GUIDED LEFT THORACENTESIS  COMPARISON:  None.  PROCEDURE: An ultrasound guided thoracentesis was thoroughly discussed with the patient and questions answered. The benefits, risks, alternatives and complications were also discussed. The patient understands and wishes to proceed with the procedure. Written consent was obtained.  Ultrasound was performed to localize and mark an adequate pocket of fluid in the left chest. The area was then prepped and draped in the normal sterile fashion. 1% Lidocaine was used for local anesthesia. Under ultrasound guidance a 19 gauge Yueh catheter was introduced. Thoracentesis was performed. The catheter was removed and a dressing applied.  COMPLICATIONS: none, post procedural chest x-ray negative for pneumothorax.  FINDINGS: A total of approximately 160 mL of cloudy, blood-tinged fluid was removed. A fluid sample wassent for laboratory analysis.  IMPRESSION: Successful ultrasound guided left thoracentesis yielding 160 mL of pleural fluid.  Read by: Brayton El PA-C   Electronically Signed   By: Irish Lack M.D.   On: 07/12/2014 10:39   Medications: . heparin 950 Units/hr (07/14/14 0317)   . ceFEPime (MAXIPIME) IV  2 g Intravenous Q T,Th,Sa-HD  . cinacalcet  30 mg Oral Q breakfast  . [START ON 07/17/2014] darbepoetin (ARANESP) injection - DIALYSIS  60 mcg Intravenous Q Thu-HD  . guaiFENesin  600 mg Oral BID  . midodrine  10 mg Oral TID WC  . multivitamin  1 tablet Oral QHS  . vancomycin  500 mg Intravenous Q T,Th,Sa-HD  . Warfarin - Pharmacist Dosing Inpatient   Does not apply [phone]

## 2014-07-15 LAB — RENAL FUNCTION PANEL
Albumin: 2.1 g/dL — ABNORMAL LOW (ref 3.5–5.2)
Anion gap: 8 (ref 5–15)
CALCIUM: 7.7 mg/dL — AB (ref 8.4–10.5)
CHLORIDE: 105 meq/L (ref 96–112)
CO2: 26 mmol/L (ref 19–32)
CREATININE: 1.5 mg/dL — AB (ref 0.50–1.10)
GFR calc non Af Amer: 32 mL/min — ABNORMAL LOW (ref >90)
GFR, EST AFRICAN AMERICAN: 37 mL/min — AB (ref >90)
GLUCOSE: 96 mg/dL (ref 70–99)
Phosphorus: 1.5 mg/dL — ABNORMAL LOW (ref 2.3–4.6)
Potassium: 3.9 mmol/L (ref 3.5–5.1)
Sodium: 139 mmol/L (ref 135–145)

## 2014-07-15 LAB — CBC
HCT: 29 % — ABNORMAL LOW (ref 36.0–46.0)
Hemoglobin: 8.8 g/dL — ABNORMAL LOW (ref 12.0–15.0)
MCH: 30.6 pg (ref 26.0–34.0)
MCHC: 30.3 g/dL (ref 30.0–36.0)
MCV: 100.7 fL — AB (ref 78.0–100.0)
PLATELETS: 317 10*3/uL (ref 150–400)
RBC: 2.88 MIL/uL — AB (ref 3.87–5.11)
RDW: 19.3 % — ABNORMAL HIGH (ref 11.5–15.5)
WBC: 13.4 10*3/uL — AB (ref 4.0–10.5)

## 2014-07-15 LAB — BODY FLUID CULTURE: Culture: NO GROWTH

## 2014-07-15 LAB — PROTIME-INR
INR: 2.02 — ABNORMAL HIGH (ref 0.00–1.49)
Prothrombin Time: 23.1 seconds — ABNORMAL HIGH (ref 11.6–15.2)

## 2014-07-15 LAB — HEPARIN LEVEL (UNFRACTIONATED)
HEPARIN UNFRACTIONATED: 0.75 [IU]/mL — AB (ref 0.30–0.70)
Heparin Unfractionated: 0.7 IU/mL (ref 0.30–0.70)

## 2014-07-15 LAB — PREPARE RBC (CROSSMATCH)

## 2014-07-15 MED ORDER — SODIUM CHLORIDE 0.9 % IV SOLN
Freq: Once | INTRAVENOUS | Status: AC
  Administered 2014-07-15: 17:00:00 via INTRAVENOUS

## 2014-07-15 MED ORDER — WHITE PETROLATUM GEL
Status: AC
  Administered 2014-07-15: 17:00:00
  Filled 2014-07-15: qty 5

## 2014-07-15 MED ORDER — NEPRO/CARBSTEADY PO LIQD
237.0000 mL | Freq: Two times a day (BID) | ORAL | Status: DC
  Administered 2014-07-15 – 2014-07-16 (×2): 237 mL via ORAL
  Filled 2014-07-15: qty 237

## 2014-07-15 MED ORDER — WARFARIN SODIUM 5 MG PO TABS
5.0000 mg | ORAL_TABLET | Freq: Once | ORAL | Status: AC
  Administered 2014-07-15: 5 mg via ORAL
  Filled 2014-07-15 (×2): qty 1

## 2014-07-15 MED ORDER — SODIUM CHLORIDE 0.9 % IV SOLN: Freq: Once | INTRAVENOUS | Status: DC

## 2014-07-15 NOTE — Plan of Care (Signed)
Problem: Food- and Nutrition-Related Knowledge Deficit (NB-1.1) Goal: Nutrition education Formal process to instruct or train a patient/client in a skill or to impart knowledge to help patients/clients voluntarily manage or modify food choices and eating behavior to maintain or improve health. Outcome: Completed/Met Date Met:  07/15/14 Nutrition Education Note  RD consulted for Renal Education. Provided "Eating healthy with Kidney disease" handout to patient/family. Reviewed food groups and provided written recommended serving sizes specifically determined for patient's current nutritional status.   Explained why diet restrictions are needed and provided lists of foods to limit/avoid that are high potassium, sodium, and phosphorus. Provided specific recommendations on safer alternatives of these foods. Strongly encouraged compliance of this diet.   Discussed importance of protein intake at each meal and snack. Provided examples of how to maximize protein intake throughout the day. Discussed need for fluid restriction with dialysis and renal-friendly beverage options.Discussed the importance of continuing with oral supplementation at home. Teach back method used.  Expect good compliance.  Body mass index is 20.39 kg/(m^2). Pt meets criteria for normal based on current BMI.  Kallie Locks, MS, RD, LDN Pager # [phone] After hours/ weekend pager # [phone]

## 2014-07-15 NOTE — Progress Notes (Signed)
INITIAL NUTRITION ASSESSMENT  Pt meets criteria for SEVERE MALNUTRITION in the context of chronic illness as evidenced by severe fat and muscle mass loss.  DOCUMENTATION CODES Per approved criteria  -Severe malnutrition in the context of chronic illness   INTERVENTION: Provide Nepro Shake po BID, each supplement provides 425 kcal and 19 grams protein.  Encourage adequate PO intake.  NUTRITION DIAGNOSIS: Increased nutrient needs related to chronic illness, ESRD as evidenced by estimated nutrition needs.   Goal: Pt to meet >/= 90% of their estimated nutrition needs   Monitor:  PO intake, weight trends, labs, I/O's  Reason for Assessment: Malnutrition  81 y.o. female  Admitting Dx: HCAP (healthcare-associated pneumonia)  ASSESSMENT: Pt with Past medical history of ESRD on hemodialysis, hypertension, dyslipidemia, GERD, gout.The patient is presenting with complaints of shortness of breath.  Pt is familiar to this RD from previous admissions. Daughter at pt bedside. She reports pt has been eating well at home with 3 full meals a day and Boost at least once daily. Current meal completion is 90%. Weight has been stable. Daughter reports wanting Nepro ordered for the pt. RD to order. Pt encouraged to eat her food at meals and to drink her supplements. RD was additionally consulted for diet education. Daughter was educated on a renal diet as pt with history of dementia.   Nutrition Focused Physical Exam:  Subcutaneous Fat:  Orbital Region: N/A Upper Arm Region: Severe depletion Thoracic and Lumbar Region: WNL  Muscle:  Temple Region: N/A Clavicle Bone Region: Severe depletion Clavicle and Acromion Bone Region: Severe depletion Scapular Bone Region: N/A Dorsal Hand: N/A Patellar Region: WNL Anterior Thigh Region: WNL Posterior Calf Region: WNL  Edema: non-pitting LUE edema  Labs: Low calcium, phosphorous, GFR. High creatinine.  Height: Ht Readings from Last 1 Encounters:   07/11/14 5\' 4"  (1.626 m)    Weight: Wt Readings from Last 1 Encounters:  07/14/14 115 lb 11.9 oz (52.5 kg)    Ideal Body Weight: 120 lbs  % Ideal Body Weight: 96%  Wt Readings from Last 10 Encounters:  07/14/14 115 lb 11.9 oz (52.5 kg)  07/08/14 116 lb 6.5 oz (52.8 kg)  06/24/14 113 lb (51.256 kg)  06/22/14 117 lb 12.8 oz (53.434 kg)  06/09/14 122 lb 9.2 oz (55.6 kg)  04/10/14 125 lb (56.7 kg)  05/07/13 151 lb (68.493 kg)  03/13/13 154 lb (69.854 kg)  11/14/11 180 lb (81.647 kg)  11/08/11 180 lb (81.647 kg)    Usual Body Weight: 120 lbs  % Usual Body Weight: 96%  BMI:  Body mass index is 19.86 kg/(m^2).  Estimated Nutritional Needs: Kcal: 1700-1900 Protein: 80-95 grams Fluid: 1.2 L/day  Skin: pressure ulcer on sacrum, non-pitting LUE edema  Diet Order: Diet renal W/126900mL fluid restriction  EDUCATION NEEDS: -Education needs addressed   Intake/Output Summary (Last 24 hours) at 07/15/14 0943 Last data filed at 07/15/14 0830  Gross per 24 hour  Intake    360 ml  Output      0 ml  Net    360 ml    Last BM: 1/1  Labs:   Recent Labs Lab 07/11/14 0655 07/12/14 1032 07/14/14 0553  NA 137 136 139  K 3.9 4.1 3.8  CL 100 100 105  CO2 26 25 30   BUN <5* 12 7  CREATININE 1.89* 3.41* 2.56*  CALCIUM 7.6* 8.3* 7.4*  MG 1.6  --   --   PHOS 1.9*  --  2.2*  GLUCOSE 72 61* 67*  CBG (last 3)  No results for input(s): GLUCAP in the last 72 hours.  Scheduled Meds: . ceFEPime (MAXIPIME) IV  2 g Intravenous Q T,Th,Sa-HD  . cinacalcet  30 mg Oral Q breakfast  . [START ON 07/17/2014] darbepoetin (ARANESP) injection - DIALYSIS  60 mcg Intravenous Q Thu-HD  . guaiFENesin  600 mg Oral BID  . midodrine  10 mg Oral TID WC  . multivitamin  1 tablet Oral QHS  . vancomycin  500 mg Intravenous Q T,Th,Sa-HD  . Warfarin - Pharmacist Dosing Inpatient   Does not apply q1800    Continuous Infusions: . heparin 1,050 Units/hr (07/15/14 0137)    Past Medical History   Diagnosis Date  . Gout   . CKD (chronic kidney disease) stage V requiring chronic dialysis   . Hypertension   . Hyperlipemia     Past Surgical History  Procedure Laterality Date  . Tubal ligation    . Av fistula placement      Marijean Niemann, MS, RD, LDN Pager # [phone] After hours/ weekend pager # [phone]

## 2014-07-15 NOTE — Progress Notes (Signed)
ANTICOAGULATION & ANTIBIOTIC CONSULT NOTE - Follow Up Consult  Pharmacy Consult for Heparin + Warfarin Indication: hx recent DVT   Allergies  Allergen Reactions  . Nsaids Other (See Comments)    REACTION: Avoid NSAIDS(renal failure)    Patient Measurements: Height: 5\' 4"  (162.6 cm) Weight: 118 lb 13.3 oz (53.9 kg) IBW/kg (Calculated) : 54.7  Vital Signs: Temp: 98.1 F (36.7 C) (01/05 1127) Temp Source: Oral (01/05 1127) BP: 119/58 mmHg (01/05 1200) Pulse Rate: 66 (01/05 1200)  Labs:  Recent Labs  07/13/14 0858  07/14/14 0553 07/14/14 1445 07/15/14 0022 07/15/14 1002 07/15/14 1058  HGB  --   --  7.8*  --   --   --  8.8*  HCT  --   --  25.5*  --   --   --  29.0*  PLT  --   --  271  --   --   --  317  LABPROT 17.3*  --  20.9*  --   --  23.1*  --   INR 1.40  --  1.78*  --   --  2.02*  --   HEPARINUNFRC  --   < > 0.31 <0.10* 0.70 0.75*  --   CREATININE  --   --  2.56*  --   --   --   --   < > = values in this interval not displayed.  Estimated Creatinine Clearance: 15.2 mL/min (by C-G formula based on Cr of 2.56).  Assessment: 81 y/o female with ESRD on warfarin PTA for recent DVT (06/06/14) who continues on a heparin bridge to a therapeutic INR with warfarin. INR this morning was therapeutic (INR 2.02 << 1.78, goal of 2-3) so will stop heparin bridge. The patient is currently in HD - this was discussed with the HD-RN. Hgb/Hct/Plt trending back up today. No overt s/sx of bleeding noted at this time.   Goal of Therapy:  INR 2-3 Heparin level 0.3-0.7 units/ml Monitor platelets by anticoagulation protocol: Yes   Plan:  1. Discontinue heparin as INR therapeutic 2. Warfarin 5 mg x 1 dose at 1800 today 3. Will continue to monitor for any signs/symptoms of bleeding and will follow up with PT/INR in the a.m.   Georgina PillionElizabeth Dicie Edelen, PharmD, BCPS Clinical Pharmacist Pager: [phone] 07/15/2014 1:43 PM

## 2014-07-15 NOTE — Procedures (Signed)
Pt seen on HD.  Hg 8.8 but due to low BP will give 1 unit PRBC's.  Ap 150 Vp 110.  BFR 400.

## 2014-07-15 NOTE — Progress Notes (Signed)
Patient ID: [name], female   DOB: 09-06-1932, 81 y.o.   MRN: [id number]  [facility] PROGRESS NOTE  [name] OZH:[id number] DOB: [birth year] DOA: 07/10/2014 PCP: Dorrene German, MD  Brief narrative: 81 y.o. female with past medical history of ESRD on hemodialysis, hypertension, dyslipidemia, GERD, gout. Patient presented to [facility] ED with complaints of shortness of breath with no associated cough, fevers or chest pain. Patient is from home and independent in most activities of daily living.   She was found to have HCAP and was started on broad spectrum antibiotics. She has left pleural effusion and has underwent left thoracentesis 07/12/2014 with 160 cc fluid removed.   Assessment and Plan:    Principal Problem: HCAP (healthcare-associated pneumonia) / Pleural effusion on left / leukocytosis   Pt started on broad spectrum antibiotics, vanco and cefepime   Pt is status post left thoracentesis 07/12/2014 with 160 cc fluid removed; fluid sent for analysis. Will follow up on culture results.   BLood cultures to date show no growth  Active Problems: Anemia of chronic disease / anemia of renal disease   Secondary to ESRD  Hemoglobin is 8.8 today, transfuse if Hg < 8  Pt takes aransep with HD.   ESRD on HD  Management per renal  Continue cinacalet  Hypotension with HD  Continue midodrine  Dementia with behavioral disturbance  Stable.  H/o DVT:  Pt is on anticoagulation with coumadin  Dosing per pharmacy   INR 1.78 this am.  Secondary hyperparathyroidism   Ca+ 7.4/corrected 9.1  cont sensipar  Severe malnutrition   In the context of chronic illness  alb 1.9, nepro, multivit. Renal diet   DVT prophylaxis  On anticoagulation with coumadin  Code Status: DNR/DNI Family Communication: daughter at bedside  Disposition Plan: remains inpatient, home when cleared by nephrology team, possibly in 1-2 days    IV Access:     Peripheral IV Procedures and diagnostic studies:    None  Medical Consultants:    Nephrology Other Consultants:    Physical therapy  Nutrition Anti-Infectives:    Vancomycin 07/12/2014 --> 1/8  Cefepime 07/12/2014 --> 1/8  Debbora Presto, MD  [facility] Pager [phone]  If 7PM-7AM, please contact night-coverage [email or web address] Password [facility] 07/15/2014, 12:34 PM   LOS: 5 days   HPI/Subjective: No events overnight.   Objective: Filed Vitals:   07/15/14 1127 07/15/14 1138 07/15/14 1143 07/15/14 1200  BP: 95/55 129/66 129/66 119/58  Pulse: 64 69 69 66  Temp: 98.1 F (36.7 C)     TempSrc: Oral     Resp: Height:      Weight: 53.9 kg (118 lb 13.3 oz)     SpO2: 100%       Intake/Output Summary (Last 24 hours) at 07/15/14 1234 Last data filed at 07/15/14 0830  Gross per 24 hour  Intake    240 ml  Output      0 ml  Net    240 ml    Exam:   General:  Pt is alert, follows commands appropriately, not in acute distress  Cardiovascular: Regular rate and rhythm, no rubs, no gallops  Respiratory: Clear to auscultation bilaterally, no wheezing, mild rhonchi at bases   Abdomen: Soft, non tender, non distended, bowel sounds present, no guarding  Extremities: pulses DP and PT palpable bilaterally  Neuro: Grossly nonfocal  Data Reviewed: Basic Metabolic Panel:  Recent Labs Lab 07/10/14 2307 07/11/14 0655 07/12/14 1032 07/14/14 0553  NA 136  137 136 139  K 4.3 3.9 4.1 3.8  CL 99 100 100 105  CO2 GLUCOSE 69* 72 61* 67*  BUN <5* <5* 12 7  CREATININE 1.47* 1.89* 3.41* 2.56*  CALCIUM 7.6* 7.6* 8.3* 7.4*  MG  --  1.6  --   --   PHOS  --  1.9*  --  2.2*   Liver Function Tests:  Recent Labs Lab 07/10/14 2307 07/11/14 0655 07/14/14 0553  AST 29 19  --   ALT 14 11  --   ALKPHOS 63 53  --   BILITOT 1.2 0.9  --   PROT 6.0 5.4*  --   ALBUMIN 2.1* 1.9* 1.9*    Recent Labs Lab 07/10/14 2307  LIPASE 33   CBC:  Recent  Labs Lab 07/10/14 2307 07/11/14 0655 07/12/14 1032 07/14/14 0553 07/15/14 1058  WBC 12.0* 10.8* 13.7* 9.6 13.4*  NEUTROABS 9.7* 8.2*  --   --   --   HGB 8.9* 7.8* 9.9* 7.8* 8.8*  HCT 29.5* 24.5* 32.3* 25.5* 29.0*  MCV 99.7 99.2 102.5* 100.8* 100.7*  PLT 269 246 347 271 317   Cardiac Enzymes:  Recent Labs Lab 07/10/14 2307  TROPONINI <0.03   Recent Results (from the past 240 hour(s))  Blood culture (routine x 2)     Status: None   Collection Time: 07/05/14 11:08 PM  Result Value Ref Range Status   Specimen Description BLOOD RIGHT ARM  Final   Special Requests BOTTLES DRAWN AEROBIC AND ANAEROBIC 5CC EA  Final   Culture   Final    NO GROWTH 5 DAYS Performed at [facility]    Report Status 07/13/2014 FINAL  Final  Blood culture (routine x 2)     Status: None   Collection Time: 07/05/14 11:18 PM  Result Value Ref Range Status   Specimen Description BLOOD RIGHT WRIST  Final   Special Requests BOTTLES DRAWN AEROBIC ONLY 3CC  Final   Culture   Final    NO GROWTH 5 DAYS Performed at [facility]    Report Status 07/13/2014 FINAL  Final  MRSA PCR Screening     Status: None   Collection Time: 07/06/14 12:11 AM  Result Value Ref Range Status   MRSA by PCR NEGATIVE NEGATIVE Final    Comment:        The GeneXpert MRSA Assay (FDA approved for NASAL specimens only), is one component of a comprehensive MRSA colonization surveillance program. It is not intended to diagnose MRSA infection nor to guide or monitor treatment for MRSA infections.   Culture, blood (routine x 2) Call MD if unable to obtain prior to antibiotics being given     Status: None (Preliminary result)   Collection Time: 07/11/14  9:30 AM  Result Value Ref Range Status   Specimen Description BLOOD RIGHT HAND  Final   Special Requests BOTTLES DRAWN AEROBIC AND ANAEROBIC 10CC  Final   Culture   Final           BLOOD CULTURE RECEIVED NO GROWTH TO DATE CULTURE WILL BE HELD FOR 5 DAYS BEFORE  ISSUING A FINAL NEGATIVE REPORT Performed at [facility]    Report Status PENDING  Incomplete  Culture, blood (routine x 2) Call MD if unable to obtain prior to antibiotics being given     Status: None (Preliminary result)   Collection Time: 07/11/14  9:39 AM  Result Value Ref Range Status   Specimen Description  BLOOD RIGHT HAND  Final   Special Requests BOTTLES DRAWN AEROBIC ONLY 10CC  Final   Culture   Final           BLOOD CULTURE RECEIVED NO GROWTH TO DATE CULTURE WILL BE HELD FOR 5 DAYS BEFORE ISSUING A FINAL NEGATIVE REPORT Performed at [facility]    Report Status PENDING  Incomplete  Body fluid culture     Status: None (Preliminary result)   Collection Time: 07/12/14  9:52 AM  Result Value Ref Range Status   Specimen Description PLEURAL  Final   Special Requests NONE  Final   Gram Stain   Final    RARE WBC PRESENT, PREDOMINANTLY MONONUCLEAR NO ORGANISMS SEEN Performed at [facility]    Culture   Final    NO GROWTH 2 DAYS Performed at [facility]    Report Status PENDING  Incomplete     Scheduled Meds: . ceFEPime (MAXIPIME) IV  2 g Intravenous Q T,Th,Sa-HD  . cinacalcet  30 mg Oral Q breakfast  . [START ON 07/17/2014] darbepoetin (ARANESP) injection - DIALYSIS  60 mcg Intravenous Q Thu-HD  . guaiFENesin  600 mg Oral BID  . midodrine  10 mg Oral TID WC  . multivitamin  1 tablet Oral QHS  . vancomycin  500 mg Intravenous Q T,Th,Sa-HD  . Warfarin - Pharmacist Dosing Inpatient   Does not apply q1800  . white petrolatum       Continuous Infusions: . heparin 1,050 Units/hr (07/15/14 0137)

## 2014-07-15 NOTE — Progress Notes (Signed)
Subjective:   No complaints, says feels well. Sitting up in chair. Daughter concerned about possible hypotension in HD and SOB after HD at outpt center   Objective Filed Vitals:   07/14/14 1015 07/14/14 1820 07/14/14 2105 07/15/14 0515  BP: 87/43 90/66 99/53  100/73  Pulse: 73 73 74 100  Temp: 97.6 F (36.4 C) 97.4 F (36.3 C) 97.8 F (36.6 C) 98.1 F (36.7 C)  TempSrc: Oral Oral Oral Oral  Resp: 18 18 18 18   Height:      Weight:   52.5 kg (115 lb 11.9 oz)   SpO2: 100% 100% 100% 100%   Physical Exam General: alert and oriented. No acute distress. Pleasantly confused.  Heart:RRR Lungs: CTA, L base dim. Unlabored.  Abdomen: soft, nontender +BS  Extremities: no LE edema  Dialysis Access: R IJ cath. L AVF +b/t. Still with swelling and erythema. No tenderness  HD: TTS , [facility],4h 4K/2.25 Bath 53.5kgheparin 5700 units bolus R IJ cath / LUA AVF (resting d/t severe infiltration, tortuous course) Aranesp 40 g weekly  Assessment/Plan: 1. SOB/ L pleural effusion/ HCAP- s/p thoracentesis by IR 07/12/14. Vanc and cefepime. Afebrile. Pt denies any sob- prior to hospitalization sob likely r/t pleural effusion- now resolved.  2. ESRD - TTS [facility], using cath. K+3.8 3. Anemia - hgb 7.8, cont ESA weekly. will transfuse if hgb less than 8 4. Secondary hyperparathyroidism - Ca+ 7.4/corrected 9.1cont sensipar 5. Hypotension/volume - 100/73 on Midodrine, slightly under edw- will watch BP closely in HD today.  6. Nutrition - alb 1.9, nepro, multivit. Renal diet 7. LLE DVT - INR 1.78. On coumadin and hep gtt.- Dr. Concepcion ElkAvbuere follows op INR 8. Dementia- at baseline  Jetty DuhamelBridget Whelan, NP [facility] [phone] 07/15/2014,9:33 AM  LOS: 5 days   I have seen and examined this patient and agree with plan per Jetty DuhamelBridget Whelan.  No SOB.  BP remains low and WU neg.  On midodrine.  Hg low at 7.8.  Will recheck at HD and if less than 8 will transfuse to see if optimizing her Hg will help  improve BP and make HD more tolerable. Melonee Gerstel T,MD 07/15/2014 10:01 AM Additional Objective Labs: Basic Metabolic Panel:  Recent Labs Lab 07/11/14 0655 07/12/14 1032 07/14/14 0553  NA 137 136 139  K 3.9 4.1 3.8  CL 100 100 105  CO2 26 25 30   GLUCOSE 72 61* 67*  BUN <5* 12 7  CREATININE 1.89* 3.41* 2.56*  CALCIUM 7.6* 8.3* 7.4*  PHOS 1.9*  --  2.2*   Liver Function Tests:  Recent Labs Lab 07/10/14 2307 07/11/14 0655 07/14/14 0553  AST 29 19  --   ALT 14 11  --   ALKPHOS 63 53  --   BILITOT 1.2 0.9  --   PROT 6.0 5.4*  --   ALBUMIN 2.1* 1.9* 1.9*    Recent Labs Lab 07/10/14 2307  LIPASE 33   CBC:  Recent Labs Lab 07/10/14 2307 07/11/14 0655 07/12/14 1032 07/14/14 0553  WBC 12.0* 10.8* 13.7* 9.6  NEUTROABS 9.7* 8.2*  --   --   HGB 8.9* 7.8* 9.9* 7.8*  HCT 29.5* 24.5* 32.3* 25.5*  MCV 99.7 99.2 102.5* 100.8*  PLT 269 246 347 271   Blood Culture    Component Value Date/Time   SDES PLEURAL 07/12/2014 0952   SPECREQUEST NONE 07/12/2014 0952   CULT  07/12/2014 0952    NO GROWTH 2 DAYS Performed at [facility]    REPTSTATUS PENDING  07/12/2014 0952    Cardiac Enzymes:  Recent Labs Lab 07/10/14 2307  TROPONINI <0.03   CBG: No results for input(s): GLUCAP in the last 168 hours. Iron Studies: No results for input(s): IRON, TIBC, TRANSFERRIN, FERRITIN in the last 72 hours. @ Studies/Results: No results found. Medications: . heparin 1,050 Units/hr (07/15/14 0137)   . ceFEPime (MAXIPIME) IV  2 g Intravenous Q T,Th,Sa-HD  . cinacalcet  30 mg Oral Q breakfast  . [START ON 07/17/2014] darbepoetin (ARANESP) injection - DIALYSIS  60 mcg Intravenous Q Thu-HD  . guaiFENesin  600 mg Oral BID  . midodrine  10 mg Oral TID WC  . multivitamin  1 tablet Oral QHS  . vancomycin  500 mg Intravenous Q T,Th,Sa-HD  . Warfarin - Pharmacist Dosing Inpatient   Does not apply [phone]

## 2014-07-15 NOTE — Progress Notes (Signed)
Received report on pt at this time.  Pt remains in HD.  Will assess when pt returns to unit.

## 2014-07-15 NOTE — Progress Notes (Signed)
ANTICOAGULATION CONSULT NOTE - Follow Up Consult  Pharmacy Consult for Heparin   Indication: hx recent DVT   Allergies  Allergen Reactions  . Nsaids Other (See Comments)    REACTION: Avoid NSAIDS(renal failure)    Patient Measurements: Height: 5\' 4"  (162.6 cm) Weight: 115 lb 11.9 oz (52.5 kg) IBW/kg (Calculated) : 54.7  Vital Signs: Temp: 97.8 F (36.6 C) (01/04 2105) Temp Source: Oral (01/04 2105) BP: 99/53 mmHg (01/04 2105) Pulse Rate: 74 (01/04 2105)  Labs:  Recent Labs  07/12/14 0528 07/12/14 1032 07/13/14 0858  07/14/14 0553 07/14/14 1445 07/15/14 0022  HGB  --  9.9*  --   --  7.8*  --   --   HCT  --  32.3*  --   --  25.5*  --   --   PLT  --  347  --   --  271  --   --   LABPROT 21.1*  --  17.3*  --  20.9*  --   --   INR 1.80*  --  1.40  --  1.78*  --   --   HEPARINUNFRC  --   --   --   < > 0.31 <0.10* 0.70  CREATININE  --  3.41*  --   --  2.56*  --   --   < > = values in this interval not displayed.  Estimated Creatinine Clearance: 14.8 mL/min (by C-G formula based on Cr of 2.56).  Assessment: 81 y/o female with ESRD on warfarin PTA for recent DVT (06/06/14) who continues on a heparin bridge to a therapeutic INR with warfarin.   Follow-up heparin level was undetectable on heparin 950 units/hr and was increased to 1100 units/hr. HL has trended up significantly to 0.7 now. RN reports no s/s of bleeding.   Goal of Therapy:  INR 2-3 Heparin level 0.3-0.7 units/ml Monitor platelets by anticoagulation protocol: Yes   Plan:  - Decrease heparin to 1050 units/hr - Check 8h HL - Daily HL/CBC - Monitor for any signs/symptoms of bleeding    Vinnie LevelBenjamin Ajanay Farve, PharmD., BCPS Clinical Pharmacist Pager [phone]

## 2014-07-15 NOTE — Evaluation (Signed)
Physical Therapy Evaluation Patient Details Name: KEOSHIA STEINMETZ MRN: [id number] DOB: 1933-01-05 Today's Date: 07/15/2014   History of Present Illness  Pt is a 81 y.o. female with a history of ESRD on hemodialysis on Tuesday Thursday and Saturday. The patient is presenting with complaints of shortness of breath. History was obtained from patient's son who is the power of attorney and was at bedside. As per the son the patient was at her baseline and was doing better after her recent discharge she was ambulating better she did not have any cough or shortness of breath. Night of admission before going to bed the patient was given incentive spirometry and was working with it and within a few minutes become short of breath.  Clinical Impression  Pt admitted with above diagnosis. Pt currently with functional limitations due to the deficits listed below (see PT Problem List). At the time of PT eval pt was able to perform transfers with supervision and ambulation with min assist. Appears that pt's tolerance for functional activity is main limiting factor at this time. Pt will benefit from skilled PT to increase their independence and safety with mobility to allow discharge to the venue listed below.       Follow Up Recommendations Home health PT;Supervision/Assistance - 24 hour    Equipment Recommendations  None recommended by PT    Recommendations for Other Services       Precautions / Restrictions Precautions Precautions: Fall Restrictions Weight Bearing Restrictions: No      Mobility  Bed Mobility Overal bed mobility: Needs Assistance Bed Mobility: Supine to Sit     Supine to sit: Supervision     General bed mobility comments: Supervision for safety, cues for technique, no assist needed.  Transfers Overall transfer level: Needs assistance Equipment used: Rolling walker (2 wheeled) Transfers: Sit to/from Stand Sit to Stand: Supervision         General transfer comment:  Supervision for safety. VC for hand placement. Performed from lowest bed setting. Did not require physical assist.  Ambulation/Gait Ambulation/Gait assistance: Min assist Ambulation Distance (Feet): 20 Feet Assistive device: Rolling walker (2 wheeled);None Gait Pattern/deviations: Step-through pattern;Decreased stride length;Trunk flexed;Narrow base of support Gait velocity: Decreased Gait velocity interpretation: Below normal speed for age/gender General Gait Details: Pt moving slowly. Required occasional assistance for walker placement/direction.   Stairs            Wheelchair Mobility    Modified Rankin (Stroke Patients Only)       Balance Overall balance assessment: Needs assistance Sitting-balance support: Feet supported Sitting balance-Leahy Scale: Fair     Standing balance support: No upper extremity supported Standing balance-Leahy Scale: Fair                               Pertinent Vitals/Pain Pain Assessment: No/denies pain    Home Living Family/patient expects to be discharged to:: Private residence Living Arrangements: Children Available Help at Discharge: Family;Available 24 hours/day Type of Home: House Home Access: Stairs to enter Entrance Stairs-Rails: Doctor, general practice of Steps: 2 Home Layout: One level Home Equipment: Walker - standard;Cane - quad;Bedside commode;Shower seat      Prior Function Level of Independence: Needs assistance   Gait / Transfers Assistance Needed: Pt reports she does not use the walker at home, however requests use of walker during session. Was not able to get a clear picture from patient exactly how she was ambulating at home. Family  member present did not offer to clear up therapist's confusion of PLOF with ambulation.   ADL's / Homemaking Assistance Needed: Assist from family for ADL's        Hand Dominance   Dominant Hand: Right    Extremity/Trunk Assessment   Upper Extremity  Assessment: Generalized weakness           Lower Extremity Assessment: Overall WFL for tasks assessed (Grossly 4 to 4+/5 strength in quads/hams, 4-/5 hip flex )      Cervical / Trunk Assessment: Normal  Communication   Communication: No difficulties  Cognition Arousal/Alertness: Awake/alert Behavior During Therapy: WFL for tasks assessed/performed Overall Cognitive Status: History of cognitive impairments - at baseline       Memory: Decreased short-term memory              General Comments      Exercises        Assessment/Plan    PT Assessment Patient needs continued PT services  PT Diagnosis Generalized weakness;Difficulty walking   PT Problem List Decreased strength;Decreased range of motion;Decreased activity tolerance;Decreased balance;Decreased mobility;Decreased knowledge of use of DME;Decreased safety awareness;Decreased knowledge of precautions  PT Treatment Interventions DME instruction;Gait training;Stair training;Functional mobility training;Therapeutic activities;Therapeutic exercise;Neuromuscular re-education;Patient/family education   PT Goals (Current goals can be found in the Care Plan section) Acute Rehab PT Goals Patient Stated Goal: Go home PT Goal Formulation: With patient/family Time For Goal Achievement: 07/22/14 Potential to Achieve Goals: Good    Frequency Min 3X/week   Barriers to discharge        Co-evaluation               End of Session Equipment Utilized During Treatment: Gait belt Activity Tolerance: Patient tolerated treatment well Patient left: in chair;with call bell/phone within reach;with family/visitor present Nurse Communication: Mobility status         Time: [id number] PT Time Calculation (min) (ACUTE ONLY): 22 min   Charges:   PT Evaluation $Initial PT Evaluation Tier I: 1 Procedure PT Treatments $Gait Training: 8-22 mins   PT G Codes:        Conni SlipperKirkman, Damiean Lukes 07/15/2014, 8:59 AM  Conni SlipperLaura Rozann Holts,  PT, DPT Acute Rehabilitation Services Pager: [phone]

## 2014-07-16 LAB — TYPE AND SCREEN
ABO/RH(D): O POS
ANTIBODY SCREEN: NEGATIVE
UNIT DIVISION: 0

## 2014-07-16 LAB — GLUCOSE, CAPILLARY
GLUCOSE-CAPILLARY: 76 mg/dL (ref 70–99)
Glucose-Capillary: 79 mg/dL (ref 70–99)
Glucose-Capillary: 78 mg/dL (ref 70–99)
Glucose-Capillary: 87 mg/dL (ref 70–99)

## 2014-07-16 LAB — PROTIME-INR
INR: 1.94 — ABNORMAL HIGH (ref 0.00–1.49)
Prothrombin Time: 22.3 seconds — ABNORMAL HIGH (ref 11.6–15.2)

## 2014-07-16 MED ORDER — WARFARIN SODIUM 5 MG PO TABS
5.0000 mg | ORAL_TABLET | Freq: Once | ORAL | Status: AC
Start: 2014-07-16 — End: 2014-07-16
  Administered 2014-07-16: 5 mg via ORAL
  Filled 2014-07-16 (×2): qty 1

## 2014-07-16 NOTE — Progress Notes (Signed)
ANTICOAGULATION & ANTIBIOTIC CONSULT NOTE - Follow Up Consult  Pharmacy Consult for Warfarin Indication: hx recent DVT   Allergies  Allergen Reactions  . Nsaids Other (See Comments)    REACTION: Avoid NSAIDS(renal failure)    Patient Measurements: Height: 5\' 4"  (162.6 cm) Weight: 116 lb 10 oz (52.9 kg) IBW/kg (Calculated) : 54.7  Vital Signs: Temp: 98.6 F (37 C) (01/06 0956) Temp Source: Oral (01/06 0956) BP: 93/45 mmHg (01/06 0956) Pulse Rate: 78 (01/06 0956)  Labs:  Recent Labs  07/14/14 0553 07/14/14 1445 07/15/14 0022 07/15/14 1002 07/15/14 1058 07/15/14 1848 07/16/14 0551  HGB 7.8*  --   --   --  8.8*  --   --   HCT 25.5*  --   --   --  29.0*  --   --   PLT 271  --   --   --  317  --   --   LABPROT 20.9*  --   --  23.1*  --   --  22.3*  INR 1.78*  --   --  2.02*  --   --  1.94*  HEPARINUNFRC 0.31 <0.10* 0.70 0.75*  --   --   --   CREATININE 2.56*  --   --   --   --  1.50*  --     Estimated Creatinine Clearance: 25.4 mL/min (by C-G formula based on Cr of 1.5).  Assessment: 81 y/o female with ESRD on warfarin PTA for recent DVT (06/06/14) who continues on warfarin. INR this morning was just below therapeutic range at 1.94. No bleeding noted, received 1 units PRBC yesterday, no new CBC today.  Goal of Therapy:  INR 2-3 Monitor platelets by anticoagulation protocol: Yes   Plan:  - Warfarin 5 mg x 1 dose at 1800 today - Will continue to monitor for any signs/symptoms of bleeding and will follow up with PT/INR in the a.m.   Loura BackJennifer [place], Pharm.D., BCPS Clinical Pharmacist Pager: [phone] 07/16/2014 11:28 AM

## 2014-07-16 NOTE — Progress Notes (Signed)
Patient ID: [name], female   DOB: April 29, 1933, 81 y.o.   MRN: [id number]  [facility] PROGRESS NOTE  [name] AOZ:[id number] DOB: 08/07/1932 DOA: 07/10/2014 PCP: Dorrene German, MD    HPI/Subjective: Status post transfusion of 1 unit in the dialysis yesterday. Was confused last night, plans for discharge in a.m. after dialysis disposition appears to be home.  Brief narrative: 81 y.o. female with past medical history of ESRD on hemodialysis, hypertension, dyslipidemia, GERD, gout. Patient presented to [facility] ED with complaints of shortness of breath with no associated cough, fevers or chest pain. Patient is from home and independent in most activities of daily living.   She was found to have HCAP and was started on broad spectrum antibiotics. She has left pleural effusion and has underwent left thoracentesis 07/12/2014 with 160 cc fluid removed.   Assessment and Plan:    HCAP (healthcare-associated pneumonia) / Pleural effusion on left / leukocytosis   Pt started on broad spectrum antibiotics, vanco and cefepime   Pt is status post left thoracentesis 07/12/2014 with 160 cc fluid removed; fluid sent for analysis. Will follow up on culture results.    Blood culture is NGTD   Anemia of chronic disease / anemia of renal disease   Secondary to ESRD  Hemoglobin is 8.8 today, transfuse if Hg < 8  Pt takes aransep with HD.   ESRD on HD  Management per renal  Continue cinacalet  Hypotension with HD  Continue midodrine  Dementia with behavioral disturbance  Stable.  H/o DVT:  Pt is on anticoagulation with coumadin  Dosing per pharmacy   INR  1.9 for this morning.  Secondary hyperparathyroidism   Ca+ 7.4/corrected 9.1  cont sensipar  Severe malnutrition   In the context of chronic illness  alb 1.9, nepro, multivit. Renal diet   DVT prophylaxis  On anticoagulation with coumadin  Code Status: DNR/DNI Family Communication:  daughter at bedside  Disposition Plan: remains inpatient, home when cleared by nephrology team, possibly in 1-2 days    IV Access:    Peripheral IV Procedures and diagnostic studies:    None  Medical Consultants:    Nephrology Other Consultants:    Physical therapy  Nutrition Anti-Infectives:    Vancomycin 07/12/2014 --> 1/8  Cefepime 07/12/2014 --> 1/8  Aymen Widrig A, MD  [facility] Pager [phone]  If 7PM-7AM, please contact night-coverage [email or web address] Password [email or web address] 07/16/2014, 5:38 PM   LOS: 6 days    Objective: Filed Vitals:   07/15/14 1749 07/15/14 2037 07/16/14 0610 07/16/14 0956  BP: 137/98 87/42 104/63 93/45  Pulse: 82 80 76 78  Temp: 97.9 F (36.6 C) 98.1 F (36.7 C) 98.2 F (36.8 C) 98.6 F (37 C)  TempSrc: Oral Oral Oral Oral  Resp: Height:   (1.626 m)    Weight:  52.9 kg (116 lb 10 oz)    SpO2: 100% 99% 100% 100%    Intake/Output Summary (Last 24 hours) at 07/16/14 1738 Last data filed at 07/16/14 1300  Gross per 24 hour  Intake    360 ml  Output      0 ml  Net    360 ml    Exam:   General:  Pt is alert, follows commands appropriately, not in acute distress  Cardiovascular: Regular rate and rhythm, no rubs, no gallops  Respiratory: Clear to auscultation bilaterally, no wheezing, mild rhonchi at bases   Abdomen: Soft, non tender, non distended, bowel sounds present, no  guarding  Extremities: pulses DP and PT palpable bilaterally  Neuro: Grossly nonfocal  Data Reviewed: Basic Metabolic Panel:  Recent Labs Lab 07/10/14 2307 07/11/14 0655 07/12/14 1032 07/14/14 0553 07/15/14 1848  NA 136 137 136 139 139  K 4.3 3.9 4.1 3.8 3.9  CL 99 100 100 105 105  CO2 GLUCOSE 69* 72 61* 67* 96  BUN <5* <5* 12 7 <5*  CREATININE 1.47* 1.89* 3.41* 2.56* 1.50*  CALCIUM 7.6* 7.6* 8.3* 7.4* 7.7*  MG  --  1.6  --   --   --   PHOS  --  1.9*  --  2.2* 1.5*   Liver Function Tests:  Recent Labs Lab  07/10/14 2307 07/11/14 0655 07/14/14 0553 07/15/14 1848  AST 29 19  --   --   ALT 14 11  --   --   ALKPHOS 63 53  --   --   BILITOT 1.2 0.9  --   --   PROT 6.0 5.4*  --   --   ALBUMIN 2.1* 1.9* 1.9* 2.1*    Recent Labs Lab 07/10/14 2307  LIPASE 33   CBC:  Recent Labs Lab 07/10/14 2307 07/11/14 0655 07/12/14 1032 07/14/14 0553 07/15/14 1058  WBC 12.0* 10.8* 13.7* 9.6 13.4*  NEUTROABS 9.7* 8.2*  --   --   --   HGB 8.9* 7.8* 9.9* 7.8* 8.8*  HCT 29.5* 24.5* 32.3* 25.5* 29.0*  MCV 99.7 99.2 102.5* 100.8* 100.7*  PLT 269 246 347 271 317   Cardiac Enzymes:  Recent Labs Lab 07/10/14 2307  TROPONINI <0.03   Recent Results (from the past 240 hour(s))  Culture, blood (routine x 2) Call MD if unable to obtain prior to antibiotics being given     Status: None (Preliminary result)   Collection Time: 07/11/14  9:30 AM  Result Value Ref Range Status   Specimen Description BLOOD RIGHT HAND  Final   Special Requests BOTTLES DRAWN AEROBIC AND ANAEROBIC 10CC  Final   Culture   Final           BLOOD CULTURE RECEIVED NO GROWTH TO DATE CULTURE WILL BE HELD FOR 5 DAYS BEFORE ISSUING A FINAL NEGATIVE REPORT Performed at [facility]    Report Status PENDING  Incomplete  Culture, blood (routine x 2) Call MD if unable to obtain prior to antibiotics being given     Status: None (Preliminary result)   Collection Time: 07/11/14  9:39 AM  Result Value Ref Range Status   Specimen Description BLOOD RIGHT HAND  Final   Special Requests BOTTLES DRAWN AEROBIC ONLY 10CC  Final   Culture   Final           BLOOD CULTURE RECEIVED NO GROWTH TO DATE CULTURE WILL BE HELD FOR 5 DAYS BEFORE ISSUING A FINAL NEGATIVE REPORT Performed at [facility]    Report Status PENDING  Incomplete  Body fluid culture     Status: None   Collection Time: 07/12/14  9:52 AM  Result Value Ref Range Status   Specimen Description PLEURAL  Final   Special Requests NONE  Final   Gram Stain   Final     RARE WBC PRESENT, PREDOMINANTLY MONONUCLEAR NO ORGANISMS SEEN Performed at [facility]    Culture   Final    NO GROWTH 3 DAYS Performed at [facility]    Report Status 07/15/2014 FINAL  Final     Scheduled Meds: .  sodium chloride   Intravenous Once  . ceFEPime (MAXIPIME) IV  2 g Intravenous Q T,Th,Sa-HD  . cinacalcet  30 mg Oral Q breakfast  . [START ON 07/17/2014] darbepoetin (ARANESP) injection - DIALYSIS  60 mcg Intravenous Q Thu-HD  . feeding supplement (NEPRO CARB STEADY)  237 mL Oral BID BM  . guaiFENesin  600 mg Oral BID  . midodrine  10 mg Oral TID WC  . multivitamin  1 tablet Oral QHS  . vancomycin  500 mg Intravenous Q T,Th,Sa-HD  . warfarin  5 mg Oral ONCE-1800  . Warfarin - Pharmacist Dosing Inpatient   Does not apply q1800   Continuous Infusions:

## 2014-07-16 NOTE — Progress Notes (Signed)
Subjective:     The patients only complaint is not being able to get up by herself. She seemed unaware of her confusion during dialysis yesterday. Patient denies fevers, chills, dizziness, headaches, SOB, Chest pain, abdominal pain, dysuria or edema. Last BM two days ago. Patient reports a good appetite but records show only 60% of food is being eaten.    Objective Filed Vitals:   07/15/14 1611 07/15/14 1749 07/15/14 2037 07/16/14 0610  BP: 115/66 137/98 87/42 104/63  Pulse: 97 82 80 76  Temp: 97.7 F (36.5 C) 97.9 F (36.6 C) 98.1 F (36.7 C) 98.2 F (36.8 C)  TempSrc: Axillary Oral Oral Oral  Resp: Height:    (1.626 m)   Weight:   52.9 kg (116 lb 10 oz)   SpO2: 100% 100% 99% 100%   Physical Exam General: Alert and oriented. No acute distress. Poor in insight and judgment.  Heart: RRR, 2/6 systolic murmur.  Lungs: CTA bilaterally, unlabored.  Abdomen: soft, Non distended, Non tender, + BS  Extremities: no LE edema Dialysis Access: R IJ cath, dry dressing, L AVF +b/f, improving swelling and erythema. No tenderness.   HD: TTS , [facility],4h 4K/2.25 Bath 53.5kgheparin 5700 units bolus R IJ cath / LUA AVF (resting d/t severe infiltration, tortuous course) Aranesp 40 g weekly  Assessment/Plan: 1. SOB/ L pleural effusion/ HCAP- s/p thoracentesis by IR 07/12/14. Vanc and cefepime. Afebrile. Pt denies any sob- prior to hospitalization sob likely r/t pleural effusion- now resolved.  2. ESRD - TTS [facility], using cath. K+3.9 3. Anemia - hgb 8.8, cont ESA weekly. Given 1 U packed RBC yesterday with HD.  4. Secondary hyperparathyroidism - Ca+ 7.7/corrected 9.2cont sensipar 5. Hypotension/volume - 104/63 on Midodrine, slightly under edw- will watch BP closely in HD  6. Nutrition - alb 2.1, nepro, multivit. Renal diet 7. LLE DVT - INR 1.94. On coumadin.- Dr. Concepcion Elk follows op INR 8. Dementia- at baseline 9. Dispo- Placement at [facility]    Jetty Duhamel, NP [facility]  [facility] [phone] 07/16/2014,8:57 AM  LOS: 6 days  I have seen and examined this patient and agree with plan per Jetty Duhamel.  Received 1 unit PRBC yest at HD.  Consider changing to PO AB. Not plan for SNF.  HD tomorrow. Corianna Avallone T,MD 07/16/2014 9:13 AM  Additional Objective Labs: Basic Metabolic Panel:  Recent Labs Lab 07/11/14 0655 07/12/14 1032 07/14/14 0553 07/15/14 1848  NA 137 136 139 139  K 3.9 4.1 3.8 3.9  CL 100 100 105 105  CO2 GLUCOSE 72 61* 67* 96  BUN <5* 12 7 <5*  CREATININE 1.89* 3.41* 2.56* 1.50*  CALCIUM 7.6* 8.3* 7.4* 7.7*  PHOS 1.9*  --  2.2* 1.5*   Liver Function Tests:  Recent Labs Lab 07/10/14 2307 07/11/14 0655 07/14/14 0553 07/15/14 1848  AST 29 19  --   --   ALT 14 11  --   --   ALKPHOS 63 53  --   --   BILITOT 1.2 0.9  --   --   PROT 6.0 5.4*  --   --   ALBUMIN 2.1* 1.9* 1.9* 2.1*    Recent Labs Lab 07/10/14 2307  LIPASE 33   CBC:  Recent Labs Lab 07/10/14 2307 07/11/14 0655 07/12/14 1032 07/14/14 0553 07/15/14 1058  WBC 12.0* 10.8* 13.7* 9.6 13.4*  NEUTROABS 9.7* 8.2*  --   --   --   HGB 8.9*  7.8* 9.9* 7.8* 8.8*  HCT 29.5* 24.5* 32.3* 25.5* 29.0*  MCV 99.7 99.2 102.5* 100.8* 100.7*  PLT 269 246 347 271 317   Blood Culture    Component Value Date/Time   SDES PLEURAL 07/12/2014 0952   SPECREQUEST NONE 07/12/2014 0952   CULT  07/12/2014 0952    NO GROWTH 3 DAYS Performed at [facility]    REPTSTATUS 07/15/2014 FINAL 07/12/2014 0952    Cardiac Enzymes:  Recent Labs Lab 07/10/14 2307  TROPONINI <0.03   CBG: No results for input(s): GLUCAP in the last 168 hours. Iron Studies: No results for input(s): IRON, TIBC, TRANSFERRIN, FERRITIN in the last 72 hours. @lablastinr3 @ Studies/Results: No results found. Medications:   . sodium chloride   Intravenous Once  . ceFEPime (MAXIPIME) IV  2 g Intravenous Q T,Th,Sa-HD  . cinacalcet  30 mg Oral Q breakfast  . [START  ON 07/17/2014] darbepoetin (ARANESP) injection - DIALYSIS  60 mcg Intravenous Q Thu-HD  . feeding supplement (NEPRO CARB STEADY)  237 mL Oral BID BM  . guaiFENesin  600 mg Oral BID  . midodrine  10 mg Oral TID WC  . multivitamin  1 tablet Oral QHS  . vancomycin  500 mg Intravenous Q T,Th,Sa-HD  . Warfarin - Pharmacist Dosing Inpatient   Does not apply [phone]

## 2014-07-16 NOTE — Progress Notes (Signed)
Physical Therapy Treatment Patient Details Name: [name] MRN: [id number] DOB: Dec 15, 1932 Today's Date: 07/16/2014    History of Present Illness Pt is a 81 y.o. female with a history of ESRD on hemodialysis on Tuesday Thursday and Saturday. The patient is presenting with complaints of shortness of breath. History was obtained from patient's son who is the power of attorney and was at bedside. As per the son the patient was at her baseline and was doing better after her recent discharge she was ambulating better she did not have any cough or shortness of breath. Night of admission before going to bed the patient was given incentive spirometry and was working with it and within a few minutes become short of breath.    PT Comments    Pt progressing towards physical therapy goals. Session was limited by 2 bowel movements. Pt was fatigued after cleaning up from second BM and no further gait training was attempted. Discussed d/c plan with daughter who states that pt "did not do well" at SNF after last admission, and daughter feels that patient shuts down when out of her home environment due to her dementia - apparently she was not progressing. I do believe that patient will do well with mobility at home, and may be more open to HHPT progressing her tolerance for functional activity vs. staying in a SNF for rehab.  Follow Up Recommendations  Home health PT;Supervision/Assistance - 24 hour     Equipment Recommendations  None recommended by PT    Recommendations for Other Services       Precautions / Restrictions Precautions Precautions: Fall Restrictions Weight Bearing Restrictions: No    Mobility  Bed Mobility Overal bed mobility: Needs Assistance Bed Mobility: Supine to Sit     Supine to sit: Supervision     General bed mobility comments: No physical assist required, however supervision for safety as pt is impulsive at times.   Transfers Overall transfer level: Needs  assistance Equipment used: Rolling walker (2 wheeled) Transfers: Sit to/from Stand Sit to Stand: Supervision         General transfer comment: Supervision for safety. VC for hand placement. Performed from lowest bed setting. Did not require physical assist.  Ambulation/Gait Ambulation/Gait assistance: Min guard Ambulation Distance (Feet): 30 Feet Assistive device: Rolling walker (2 wheeled) Gait Pattern/deviations: Step-through pattern;Decreased stride length;Trunk flexed Gait velocity: Decreased Gait velocity interpretation: Below normal speed for age/gender General Gait Details: Pt moving slowly. Able to maneuver RW well into bathroom and back to recliner chair without assist. No unseadiness noted.    Stairs            Wheelchair Mobility    Modified Rankin (Stroke Patients Only)       Balance Overall balance assessment: Needs assistance Sitting-balance support: Feet supported;No upper extremity supported Sitting balance-Leahy Scale: Fair     Standing balance support: No upper extremity supported Standing balance-Leahy Scale: Fair                      Cognition Arousal/Alertness: Awake/alert Behavior During Therapy: WFL for tasks assessed/performed Overall Cognitive Status: History of cognitive impairments - at baseline       Memory: Decreased short-term memory              Exercises      General Comments        Pertinent Vitals/Pain Pain Assessment: No/denies pain    Home Living  Prior Function            PT Goals (current goals can now be found in the care plan section) Acute Rehab PT Goals Patient Stated Goal: Go home PT Goal Formulation: With patient/family Time For Goal Achievement: 07/22/14 Potential to Achieve Goals: Good Progress towards PT goals: Progressing toward goals    Frequency  Min 3X/week    PT Plan Current plan remains appropriate    Co-evaluation             End of  Session Equipment Utilized During Treatment: Gait belt Activity Tolerance: Patient tolerated treatment well Patient left: in chair;with chair alarm set;with call bell/phone within reach;with family/visitor present     Time: [id number] PT Time Calculation (min) (ACUTE ONLY): 30 min  Charges:  $Gait Training: 8-22 mins $Therapeutic Activity: 8-22 mins                    G Codes:      Conni SlipperKirkman, Trevaris Pennella 07/16/2014, 9:32 AM  Conni SlipperLaura Janthony Holleman, PT, DPT Acute Rehabilitation Services Pager: [phone]

## 2014-07-17 LAB — PROTIME-INR
INR: 2.34 — ABNORMAL HIGH (ref 0.00–1.49)
PROTHROMBIN TIME: 25.8 s — AB (ref 11.6–15.2)

## 2014-07-17 LAB — CULTURE, BLOOD (ROUTINE X 2)
Culture: NO GROWTH
Culture: NO GROWTH

## 2014-07-17 LAB — CBC
HEMATOCRIT: 30.5 % — AB (ref 36.0–46.0)
HEMOGLOBIN: 9.6 g/dL — AB (ref 12.0–15.0)
MCH: 31.2 pg (ref 26.0–34.0)
MCHC: 31.5 g/dL (ref 30.0–36.0)
MCV: 99 fL (ref 78.0–100.0)
Platelets: 212 10*3/uL (ref 150–400)
RBC: 3.08 MIL/uL — AB (ref 3.87–5.11)
RDW: 19 % — ABNORMAL HIGH (ref 11.5–15.5)
WBC: 9.6 10*3/uL (ref 4.0–10.5)

## 2014-07-17 LAB — GLUCOSE, CAPILLARY
Glucose-Capillary: 53 mg/dL — ABNORMAL LOW (ref 70–99)
Glucose-Capillary: 57 mg/dL — ABNORMAL LOW (ref 70–99)
Glucose-Capillary: 62 mg/dL — ABNORMAL LOW (ref 70–99)
Glucose-Capillary: 127 mg/dL — ABNORMAL HIGH (ref 70–99)

## 2014-07-17 LAB — RENAL FUNCTION PANEL
ALBUMIN: 2 g/dL — AB (ref 3.5–5.2)
Anion gap: 7 (ref 5–15)
BUN: 20 mg/dL (ref 6–23)
CALCIUM: 7.7 mg/dL — AB (ref 8.4–10.5)
CO2: 27 mmol/L (ref 19–32)
CREATININE: 3.27 mg/dL — AB (ref 0.50–1.10)
Chloride: 105 mEq/L (ref 96–112)
GFR calc Af Amer: 14 mL/min — ABNORMAL LOW (ref >90)
GFR calc non Af Amer: 12 mL/min — ABNORMAL LOW (ref >90)
Glucose, Bld: 73 mg/dL (ref 70–99)
Phosphorus: 3.1 mg/dL (ref 2.3–4.6)
Potassium: 3.6 mmol/L (ref 3.5–5.1)
Sodium: 139 mmol/L (ref 135–145)

## 2014-07-17 MED ORDER — GLUCOSE 40 % PO GEL
ORAL | Status: AC
  Administered 2014-07-17: 37.5 g
  Filled 2014-07-17: qty 1

## 2014-07-17 MED ORDER — HEPARIN SODIUM (PORCINE) 1000 UNIT/ML IJ SOLN
5700.0000 [IU] | Freq: Once | INTRAMUSCULAR | Status: AC
  Administered 2014-07-17: 5700 [IU] via INTRAVENOUS

## 2014-07-17 MED ORDER — AMOXICILLIN-POT CLAVULANATE 875-125 MG PO TABS: 1.0000 | ORAL_TABLET | Freq: Two times a day (BID) | ORAL | Status: DC

## 2014-07-17 MED ORDER — LORAZEPAM 0.5 MG PO TABS
ORAL_TABLET | ORAL | Status: AC
  Filled 2014-07-17: qty 1

## 2014-07-17 MED ORDER — DEXTROSE 50 % IV SOLN
INTRAVENOUS | Status: AC
  Filled 2014-07-17: qty 50

## 2014-07-17 MED ORDER — DEXTROSE 50 % IV SOLN
1.0000 | Freq: Once | INTRAVENOUS | Status: AC
  Administered 2014-07-17: 50 mL via INTRAVENOUS

## 2014-07-17 MED ORDER — MIDODRINE HCL 5 MG PO TABS
ORAL_TABLET | ORAL | Status: AC
  Filled 2014-07-17: qty 2

## 2014-07-17 MED ORDER — WARFARIN SODIUM 4 MG PO TABS
4.0000 mg | ORAL_TABLET | Freq: Once | ORAL | Status: DC
  Filled 2014-07-17: qty 1

## 2014-07-17 MED ORDER — DARBEPOETIN ALFA 60 MCG/0.3ML IJ SOSY
PREFILLED_SYRINGE | INTRAMUSCULAR | Status: AC
  Filled 2014-07-17: qty 0.3

## 2014-07-17 NOTE — Procedures (Signed)
Pt seen on HD.Marland Kitchen. AP 170 Vp 120.  BFR 400.  SBP a little better at 116.  Note plans for DC today.

## 2014-07-17 NOTE — Progress Notes (Signed)
Patient discharge teaching given to daughter Kenney Houseman(Tanya), including activity, diet, follow-up appoints, and medications. Patient's family verbalized understanding of all discharge instructions. IV access was d/c'd. Vitals are stable. Skin is intact except as charted in most recent assessments. Pt to be escorted out by NT, to be driven home by family.  Peri MarisAndrew Marisol Giambra, MBA, BS, RN

## 2014-07-17 NOTE — Progress Notes (Signed)
Hypoglycemic Event  CBG: 53  Treatment: 15 GM gel  Symptoms: None  Follow-up CBG: Time:12:10 CBG Result: 57  Follow-up CBG: Time: 13:02 CBG Result: 62  Follow-up CBG: Time:13:50 CBG Result: 127  Possible Reasons for Event: Inadequate meal intake  Comments/MD notified:Elmahi    [name]  Remember to initiate Hypoglycemia Order Set & complete

## 2014-07-17 NOTE — Discharge Summary (Signed)
Physician Discharge Summary  CARTINA BROUSSEAU UJW:[id number] DOB: [birth year] DOA: 07/10/2014  PCP: Dorrene German, MD  Admit date: 07/10/2014 Discharge date: 07/17/2014  Time spent: 40 minutes  Recommendations for Outpatient Follow-up:  1. Follow-up with primary care physician in one week.  Discharge Diagnoses:  Principal Problem:   HCAP (healthcare-associated pneumonia) Active Problems:   GERD   ESRD on dialysis   Dementia with behavioral disturbance   Hypertension   Pleural effusion on left   Anemia of renal disease   Discharge Condition: Stable  Diet recommendation: Heart healthy low potassium diet  Filed Weights   07/16/14 2141 07/17/14 0712 07/17/14 1112  Weight: 53.07 kg (117 lb) 54.8 kg (120 lb 13 oz) 53 kg (116 lb 13.5 oz)    History of present illness:  [name] is a 81 y.o. female with Past medical history of ESRD on hemodialysis, hypertension, dyslipidemia, GERD, gout. The patient is presenting with complaints of shortness of breath. History was obtained from patient's son who is the power of attorney and was at bedside. As per the son the patient was at her baseline and was doing better after her recent discharge she was ambulating better she did not have any cough or shortness of breath. She didn't have any fever or chills. She has been eating good and there was no episode of aspiration. Tonight before going to bedthe patient was given incentive spirometry and was working with it and within a few minutes become short of breath. She did not have any chest pain did not have any choking did not have any abdominal pain. She was compliant with all the medications. At the time of evaluation patient denied any complaint of chest pain chest heaviness chest tightness or shortness of breath. No fever no chills reported.  The patient is coming from home And at her baseline independent for most of her ADL.  Hospital Course:   HCAP (healthcare-associated  pneumonia) / Pleural effusion on left / leukocytosis   Pt started on broad spectrum antibiotics, vanco and cefepime started initially   Status post left thoracentesis 07/12/2014 with 160 cc fluid removed.  Fluid analysis consistent with parapneumonic effusion, culture and Gram stain negative.   Blood culture is NGTD.  Patient discharged on Augmentin for 5 more days.  Anemia of chronic disease / anemia of renal disease   Secondary to ESRD  Hemoglobin is 8.8 today, transfuse if Hg < 8  Pt takes aransep with HD, hemoglobin improved to 9.6 prior to discharge.   ESRD on HD  Management per renal  Continue cinacalet  Hypotension with HD  Continue midodrine  Dementia with behavioral disturbance  Stable.  H/o DVT:  Pt is on anticoagulation with coumadin  Dosing per pharmacy   INR 2.3 on discharge.  Secondary hyperparathyroidism   Ca+ 7.4/corrected 9.1  cont sensipar  Severe malnutrition   In the context of chronic illness  alb 1.9, nepro, multivit. Renal diet  Hypoglycemia  Patient develop hypoglycemia on the discharge day likely secondary to poor oral intake.  Advised about small frequent meals.   Procedures:  Routine hemodialysis  Consultations:  Nephrology  Discharge Exam: Filed Vitals:   07/17/14 1200  BP: 93/43  Pulse: 85  Temp: 97.8 F (36.6 C)  Resp: 16   General: Alert and awake, oriented only to self, not in any acute distress. HEENT: anicteric sclera, pupils reactive to light and accommodation, EOMI CVS: S1-S2 clear, no murmur rubs or gallops Chest: clear to auscultation bilaterally, no  wheezing, rales or rhonchi Abdomen: soft nontender, nondistended, normal bowel sounds, no organomegaly Extremities: no cyanosis, clubbing or edema noted bilaterally Neuro: Cranial nerves II-XII intact, no focal neurological deficits   Discharge Instructions   Discharge Instructions    Diet - low sodium heart healthy    Complete by:  As  directed      Increase activity slowly    Complete by:  As directed           Current Discharge Medication List    START taking these medications   Details  amoxicillin-clavulanate (AUGMENTIN) 875-125 MG per tablet Take 1 tablet by mouth 2 (two) times daily. Qty: 10 tablet, Refills: 0      CONTINUE these medications which have NOT CHANGED   Details  albuterol (PROAIR HFA) 108 (90 BASE) MCG/ACT inhaler Inhale 1 puff into the lungs every 6 (six) hours as needed for wheezing or shortness of breath.     cinacalcet (SENSIPAR) 30 MG tablet Take 1 tablet (30 mg total) by mouth daily with breakfast. Qty: 30 tablet, Refills: 3    guaifenesin (ROBITUSSIN) 100 MG/5ML syrup Take 10 mLs (200 mg total) by mouth every 4 (four) hours while awake. Qty: 120 mL, Refills: 0    HYDROcodone-acetaminophen (NORCO/VICODIN) 5-325 MG per tablet Take 1-2 tablets by mouth every 4 (four) hours as needed for moderate pain or severe pain. Qty: 30 tablet, Refills: 0    LORazepam (ATIVAN) 0.5 MG tablet Take 1 tablet (0.5 mg total) by mouth every 12 (twelve) hours as needed for anxiety. Also-may give 1 hour prior to dialysis Qty: 30 tablet, Refills: 0    midodrine (PROAMATINE) 10 MG tablet Take 1 tablet (10 mg total) by mouth 3 (three) times daily with meals. Qty: 90 tablet, Refills: 3    multivitamin (RENA-VIT) TABS tablet Take 1 tablet by mouth daily.    traMADol (ULTRAM) 50 MG tablet Take 1 tablet (50 mg total) by mouth every 6 (six) hours as needed for moderate pain. Qty: 30 tablet, Refills: 0    nitroGLYCERIN (NITROSTAT) 0.4 MG SL tablet Place 0.4 mg under the tongue every 5 (five) minutes as needed for chest pain.    Nutritional Supplements (FEEDING SUPPLEMENT, NEPRO CARB STEADY,) LIQD Take 237 mLs by mouth 2 (two) times daily between meals. Qty: 60 Can, Refills: 3    warfarin (COUMADIN) 4 MG tablet Take 1 tablet (4 mg total) by mouth every evening. Qty: 30 tablet, Refills: 0      STOP taking  these medications     levofloxacin (LEVAQUIN) 500 MG tablet        Allergies  Allergen Reactions  . Nsaids Other (See Comments)    REACTION: Avoid NSAIDS(renal failure)   Follow-up Information    Follow up with [name],[name], MD In 1 week.   Specialty:  Internal Medicine   Contact information:   [address] [place] [address] [phone]        The results of significant diagnostics from this hospitalization (including imaging, microbiology, ancillary and laboratory) are listed below for reference.    Significant Diagnostic Studies: Dg Chest 1 View  07/12/2014   CLINICAL DATA:  Evaluation for status post left thoracentesis  EXAM: CHEST - 1 VIEW  COMPARISON:  07/10/2014  FINDINGS: Stable cardiac enlargement and right central line. Right lung is clear.  Small left effusion decreased in size when compared to prior study. No pneumothorax. Mild persistent left lower lobe atelectasis.  IMPRESSION: No pneumothorax status post left thoracentesis.  Electronically Signed   By: Esperanza Heir M.D.   On: 07/12/2014 10:37   Dg Chest 2 View  07/11/2014   CLINICAL DATA:  Dyspnea. History of chronic kidney disease, hypertension.  EXAM: CHEST  2 VIEW  COMPARISON:  Chest radiograph July 09, 2014 and CT of the chest July 06, 2014  FINDINGS: Cardiac silhouette is moderately enlarged, unchanged. Calcified aortic knob. Persistent LEFT lower lobe consolidation and increasing, now moderate layering pleural effusion. Mild chronic interstitial changes increased lung volumes can be seen with COPD. No pneumothorax.  Tunneled dialysis catheter via RIGHT internal jugular venous approach distal tips projected cavoatrial junction. Patient is osteopenic. Osseous structures are nonsuspicious.  IMPRESSION: Increasing moderate LEFT pleural effusion with underlying consolidation. Background of apparent COPD. Recommend followup chest radiograph after treatment to verify improvement.  Similar  cardiomegaly.   Electronically Signed   By: Awilda Metro   On: 07/11/2014 00:13   Dg Chest 2 View  07/09/2014   CLINICAL DATA:  Shortness of breath, cough, and wheezing with onset today  EXAM: CHEST  2 VIEW  COMPARISON:  CT scan of the chest of July 06, 2014 and portable chest x-ray of July 05, 2014  FINDINGS: The right lung is well-expanded and clear. On the left there is basilar increased density little changed from the previous study. The cardiac silhouette is enlarged. The pulmonary vascularity is not engorged. A large caliber dual-lumen dialysis type catheter is in place with the tip at the cavoatrial junction. The mediastinum is normal in width.  IMPRESSION: Persistent increased density at the left lung base is consistent with known atelectasis or pneumonia. There is no evidence of pulmonary edema.   Electronically Signed   By: David  [place]   On: 07/09/2014 07:58   Dg Chest 2 View  07/02/2014   CLINICAL DATA:  Fever, line placement  EXAM: CHEST  2 VIEW  COMPARISON:  06/13/2014  FINDINGS: Heart size is mildly enlarged. Lungs are hypoaerated with crowding of the bronchovascular markings. Linear bilateral lower lobe scarring or atelectasis. Right-sided dual lumen catheter tip projects over the right atrium. Trace pleural effusions are present. No acute osseous finding. No pneumothorax.  IMPRESSION: Right-sided IJ approach dual lumen catheter with tip over the right atrium.  Trace pleural effusions and bilateral lower lobe probable atelectasis. Minimal prominence of the interstitial markings could indicate very early interstitial edema.   Electronically Signed   By: Christiana Pellant M.D.   On: 07/02/2014 20:16   Ct Chest Wo Contrast  07/11/2014   CLINICAL DATA:  Persistent shortness of breath.  EXAM: CT CHEST WITHOUT CONTRAST  TECHNIQUE: Multidetector CT imaging of the chest was performed following the standard protocol without IV contrast.  COMPARISON:  07/06/2014  FINDINGS: Chest wall: No  breast masses, supraclavicular or axillary lymphadenopathy. A right IJ dialysis catheter is stable. The bony thorax is intact.  Mediastinum: The heart is borderline enlarged but stable. No pericardial effusion. No mediastinal or hilar mass or adenopathy. Stable coronary artery and aortic calcifications. The pulmonary arteries are enlarged suggesting pulmonary hypertension.  Lungs: There is persistent left lower lobe airspace consolidation with air bronchograms and cystic changes. There is also persistent loculated pleural fluid collections. Could not exclude pneumonia, early pulmonary abscess and empyema. Stable emphysematous changes. No worrisome pulmonary nodules. Stable right basilar scarring changes.  Upper abdomen: Cholelithiasis and probable chronic left UPJ obstruction.  IMPRESSION: Persistent left lower lobe process suspicious for pneumonia with possible early lung abscess and empyema.  Enlarged pulmonary artery  suggesting pulmonary hypertension.  Emphysematous changes and pulmonary scarring.  Cholelithiasis and probable chronic left UPJ obstruction   Electronically Signed   By: Loralie ChampagneMark  Gallerani M.D.   On: 07/11/2014 12:49   Ct Angio Chest Pe W/cm &/or Wo Cm  07/06/2014   CLINICAL DATA:  Shortness of breath, cough.  EXAM: CT ANGIOGRAPHY CHEST WITH CONTRAST  TECHNIQUE: Multidetector CT imaging of the chest was performed using the standard protocol during bolus administration of intravenous contrast. Multiplanar CT image reconstructions and MIPs were obtained to evaluate the vascular anatomy.  CONTRAST:  100mL OMNIPAQUE IOHEXOL 350 MG/ML SOLN  COMPARISON:  Chest x-ray 07/05/2014.  FINDINGS: No filling defects in the pulmonary arteries to suggest pulmonary emboli. Heart is mildly enlarged. Aorta is normal caliber.  Layering material noted within the mainstem bronchi bilaterally. Mucous plugging of the low left lower lobe bronchi with left lower lobe atelectasis or consolidation. Small left pleural effusion.  No definite obstructing central mass lesion.  Atelectasis noted at the right lung base posteriorly. Patchy ground-glass opacities noted in the lingula and left upper lobe. No pleural effusion on the right. No mediastinal, hilar, or axillary adenopathy.  Right dialysis catheter is in place. Chest wall soft tissues are unremarkable. Imaging into the upper abdomen shows no acute findings.  Review of the MIP images confirms the above findings.  IMPRESSION: No evidence of pulmonary embolus.  Mucous plugging of the left lower lobe bronchi with associated left lower lobe atelectasis or consolidation/pneumonia. Small left effusion. Layering debris/ mucous in the mainstem bronchi bilaterally.  Cardiomegaly.   Electronically Signed   By: Charlett NoseKevin  Dover M.D.   On: 07/06/2014 23:18   Dg Chest Port 1 View  07/05/2014   CLINICAL DATA:  81 year old female with shortness of Breath. Initial encounter.  EXAM: PORTABLE CHEST - 1 VIEW  COMPARISON:  07/02/2014 and earlier.  FINDINGS: Portable AP semi upright view at at 2018 hrs. Stable right chest tunneled dual lumen dialysis catheter. Stable cardiac size and mediastinal contours. No pneumothorax or pulmonary edema. Interval increased left lung base opacity, obscuring most of the left hemidiaphragm.  IMPRESSION: Increased left lung base opacity felt in part due to pleural effusion, with interval lower lobe collapse or consolidation possible. No acute pulmonary edema.   Electronically Signed   By: Augusto GambleLee  Hall M.D.   On: 07/05/2014 20:33   [place] Thoracentesis Asp Pleural Space W/img Guide  07/12/2014   CLINICAL DATA:  Shortness of breath. Request diagnostic thoracentesis.  EXAM: ULTRASOUND GUIDED LEFT THORACENTESIS  COMPARISON:  None.  PROCEDURE: An ultrasound guided thoracentesis was thoroughly discussed with the patient and questions answered. The benefits, risks, alternatives and complications were also discussed. The patient understands and wishes to proceed with the procedure. Written  consent was obtained.  Ultrasound was performed to localize and mark an adequate pocket of fluid in the left chest. The area was then prepped and draped in the normal sterile fashion. 1% Lidocaine was used for local anesthesia. Under ultrasound guidance a 19 gauge Yueh catheter was introduced. Thoracentesis was performed. The catheter was removed and a dressing applied.  COMPLICATIONS: none, post procedural chest x-ray negative for pneumothorax.  FINDINGS: A total of approximately 160 mL of cloudy, blood-tinged fluid was removed. A fluid sample wassent for laboratory analysis.  IMPRESSION: Successful ultrasound guided left thoracentesis yielding 160 mL of pleural fluid.  Read by: Brayton ElKevin Bruning PA-C   Electronically Signed   By: Irish LackGlenn  Yamagata M.D.   On: 07/12/2014 10:39    Microbiology:  Recent Results (from the past 240 hour(s))  Culture, blood (routine x 2) Call MD if unable to obtain prior to antibiotics being given     Status: None   Collection Time: 07/11/14  9:30 AM  Result Value Ref Range Status   Specimen Description BLOOD RIGHT HAND  Final   Special Requests BOTTLES DRAWN AEROBIC AND ANAEROBIC 10CC  Final   Culture   Final    NO GROWTH 5 DAYS Performed at [facility]    Report Status 07/17/2014 FINAL  Final  Culture, blood (routine x 2) Call MD if unable to obtain prior to antibiotics being given     Status: None   Collection Time: 07/11/14  9:39 AM  Result Value Ref Range Status   Specimen Description BLOOD RIGHT HAND  Final   Special Requests BOTTLES DRAWN AEROBIC ONLY 10CC  Final   Culture   Final    NO GROWTH 5 DAYS Performed at [facility]    Report Status 07/17/2014 FINAL  Final  Body fluid culture     Status: None   Collection Time: 07/12/14  9:52 AM  Result Value Ref Range Status   Specimen Description PLEURAL  Final   Special Requests NONE  Final   Gram Stain   Final    RARE WBC PRESENT, PREDOMINANTLY MONONUCLEAR NO ORGANISMS SEEN Performed at  [facility]    Culture   Final    NO GROWTH 3 DAYS Performed at [facility]    Report Status 07/15/2014 FINAL  Final     Labs: Basic Metabolic Panel:  Recent Labs Lab 07/11/14 0655 07/12/14 1032 07/14/14 0553 07/15/14 1848 07/17/14 0719  NA 137 136 139 139 139  K 3.9 4.1 3.8 3.9 3.6  CL 100 100 105 105 105  CO2 26 25 30 26 27   GLUCOSE 72 61* 67* 96 73  BUN <5* 12 7 <5* 20  CREATININE 1.89* 3.41* 2.56* 1.50* 3.27*  CALCIUM 7.6* 8.3* 7.4* 7.7* 7.7*  MG 1.6  --   --   --   --   PHOS 1.9*  --  2.2* 1.5* 3.1   Liver Function Tests:  Recent Labs Lab 07/10/14 2307 07/11/14 0655 07/14/14 0553 07/15/14 1848 07/17/14 0719  AST 29 19  --   --   --   ALT 14 11  --   --   --   ALKPHOS 63 53  --   --   --   BILITOT 1.2 0.9  --   --   --   PROT 6.0 5.4*  --   --   --   ALBUMIN 2.1* 1.9* 1.9* 2.1* 2.0*    Recent Labs Lab 07/10/14 2307  LIPASE 33   No results for input(s): AMMONIA in the last 168 hours. CBC:  Recent Labs Lab 07/10/14 2307 07/11/14 0655 07/12/14 1032 07/14/14 0553 07/15/14 1058 07/17/14 0500  WBC 12.0* 10.8* 13.7* 9.6 13.4* 9.6  NEUTROABS 9.7* 8.2*  --   --   --   --   HGB 8.9* 7.8* 9.9* 7.8* 8.8* 9.6*  HCT 29.5* 24.5* 32.3* 25.5* 29.0* 30.5*  MCV 99.7 99.2 102.5* 100.8* 100.7* 99.0  PLT 269 246 347 271 317 212   Cardiac Enzymes:  Recent Labs Lab 07/10/14 2307  TROPONINI <0.03   BNP: BNP (last 3 results) No results for input(s): PROBNP in the last 8760 hours. CBG:  Recent Labs Lab 07/16/14 2137 07/17/14 1135 07/17/14 1136 07/17/14 1210 07/17/14 1302  GLUCAP 87 43* 53* 57* 62*       Signed:  George Haggart A  [facility] 07/17/2014, 1:08 PM

## 2014-07-18 DIAGNOSIS — N186 End stage renal disease: Secondary | ICD-10-CM | POA: Diagnosis not present

## 2014-07-18 DIAGNOSIS — Z5181 Encounter for therapeutic drug level monitoring: Secondary | ICD-10-CM | POA: Diagnosis not present

## 2014-07-18 DIAGNOSIS — I120 Hypertensive chronic kidney disease with stage 5 chronic kidney disease or end stage renal disease: Secondary | ICD-10-CM | POA: Diagnosis not present

## 2014-07-18 DIAGNOSIS — E46 Unspecified protein-calorie malnutrition: Secondary | ICD-10-CM | POA: Diagnosis not present

## 2014-07-18 DIAGNOSIS — D649 Anemia, unspecified: Secondary | ICD-10-CM | POA: Diagnosis not present

## 2014-07-18 DIAGNOSIS — F419 Anxiety disorder, unspecified: Secondary | ICD-10-CM | POA: Diagnosis not present

## 2014-07-18 DIAGNOSIS — Z7901 Long term (current) use of anticoagulants: Secondary | ICD-10-CM | POA: Diagnosis not present

## 2014-07-18 DIAGNOSIS — F0391 Unspecified dementia with behavioral disturbance: Secondary | ICD-10-CM | POA: Diagnosis not present

## 2014-07-18 DIAGNOSIS — I82402 Acute embolism and thrombosis of unspecified deep veins of left lower extremity: Secondary | ICD-10-CM | POA: Diagnosis not present

## 2014-07-18 DIAGNOSIS — J45909 Unspecified asthma, uncomplicated: Secondary | ICD-10-CM | POA: Diagnosis not present

## 2014-07-19 DIAGNOSIS — D688 Other specified coagulation defects: Secondary | ICD-10-CM | POA: Diagnosis not present

## 2014-07-19 DIAGNOSIS — E46 Unspecified protein-calorie malnutrition: Secondary | ICD-10-CM | POA: Diagnosis not present

## 2014-07-19 DIAGNOSIS — I120 Hypertensive chronic kidney disease with stage 5 chronic kidney disease or end stage renal disease: Secondary | ICD-10-CM | POA: Diagnosis not present

## 2014-07-19 DIAGNOSIS — Z5181 Encounter for therapeutic drug level monitoring: Secondary | ICD-10-CM | POA: Diagnosis not present

## 2014-07-19 DIAGNOSIS — F419 Anxiety disorder, unspecified: Secondary | ICD-10-CM | POA: Diagnosis not present

## 2014-07-19 DIAGNOSIS — D509 Iron deficiency anemia, unspecified: Secondary | ICD-10-CM | POA: Diagnosis not present

## 2014-07-19 DIAGNOSIS — D649 Anemia, unspecified: Secondary | ICD-10-CM | POA: Diagnosis not present

## 2014-07-19 DIAGNOSIS — I82402 Acute embolism and thrombosis of unspecified deep veins of left lower extremity: Secondary | ICD-10-CM | POA: Diagnosis not present

## 2014-07-19 DIAGNOSIS — F0391 Unspecified dementia with behavioral disturbance: Secondary | ICD-10-CM | POA: Diagnosis not present

## 2014-07-19 DIAGNOSIS — N186 End stage renal disease: Secondary | ICD-10-CM | POA: Diagnosis not present

## 2014-07-19 DIAGNOSIS — Z7901 Long term (current) use of anticoagulants: Secondary | ICD-10-CM | POA: Diagnosis not present

## 2014-07-19 DIAGNOSIS — D631 Anemia in chronic kidney disease: Secondary | ICD-10-CM | POA: Diagnosis not present

## 2014-07-19 DIAGNOSIS — T8249XA Other complication of vascular dialysis catheter, initial encounter: Secondary | ICD-10-CM | POA: Diagnosis not present

## 2014-07-19 DIAGNOSIS — J45909 Unspecified asthma, uncomplicated: Secondary | ICD-10-CM | POA: Diagnosis not present

## 2014-07-21 DIAGNOSIS — I120 Hypertensive chronic kidney disease with stage 5 chronic kidney disease or end stage renal disease: Secondary | ICD-10-CM | POA: Diagnosis not present

## 2014-07-21 DIAGNOSIS — J45909 Unspecified asthma, uncomplicated: Secondary | ICD-10-CM | POA: Diagnosis not present

## 2014-07-21 DIAGNOSIS — Z7901 Long term (current) use of anticoagulants: Secondary | ICD-10-CM | POA: Diagnosis not present

## 2014-07-21 DIAGNOSIS — Z5181 Encounter for therapeutic drug level monitoring: Secondary | ICD-10-CM | POA: Diagnosis not present

## 2014-07-21 DIAGNOSIS — F419 Anxiety disorder, unspecified: Secondary | ICD-10-CM | POA: Diagnosis not present

## 2014-07-21 DIAGNOSIS — F0391 Unspecified dementia with behavioral disturbance: Secondary | ICD-10-CM | POA: Diagnosis not present

## 2014-07-21 DIAGNOSIS — E46 Unspecified protein-calorie malnutrition: Secondary | ICD-10-CM | POA: Diagnosis not present

## 2014-07-21 DIAGNOSIS — N186 End stage renal disease: Secondary | ICD-10-CM | POA: Diagnosis not present

## 2014-07-21 DIAGNOSIS — I82402 Acute embolism and thrombosis of unspecified deep veins of left lower extremity: Secondary | ICD-10-CM | POA: Diagnosis not present

## 2014-07-21 DIAGNOSIS — D649 Anemia, unspecified: Secondary | ICD-10-CM | POA: Diagnosis not present

## 2014-07-21 LAB — GLUCOSE, CAPILLARY: Glucose-Capillary: 43 mg/dL — CL (ref 70–99)

## 2014-07-22 DIAGNOSIS — D509 Iron deficiency anemia, unspecified: Secondary | ICD-10-CM | POA: Diagnosis not present

## 2014-07-22 DIAGNOSIS — F419 Anxiety disorder, unspecified: Secondary | ICD-10-CM | POA: Diagnosis not present

## 2014-07-22 DIAGNOSIS — D631 Anemia in chronic kidney disease: Secondary | ICD-10-CM | POA: Diagnosis not present

## 2014-07-22 DIAGNOSIS — D688 Other specified coagulation defects: Secondary | ICD-10-CM | POA: Diagnosis not present

## 2014-07-22 DIAGNOSIS — N186 End stage renal disease: Secondary | ICD-10-CM | POA: Diagnosis not present

## 2014-07-22 DIAGNOSIS — D649 Anemia, unspecified: Secondary | ICD-10-CM | POA: Diagnosis not present

## 2014-07-22 DIAGNOSIS — Z7901 Long term (current) use of anticoagulants: Secondary | ICD-10-CM | POA: Diagnosis not present

## 2014-07-22 DIAGNOSIS — I120 Hypertensive chronic kidney disease with stage 5 chronic kidney disease or end stage renal disease: Secondary | ICD-10-CM | POA: Diagnosis not present

## 2014-07-22 DIAGNOSIS — T8249XA Other complication of vascular dialysis catheter, initial encounter: Secondary | ICD-10-CM | POA: Diagnosis not present

## 2014-07-22 DIAGNOSIS — Z5181 Encounter for therapeutic drug level monitoring: Secondary | ICD-10-CM | POA: Diagnosis not present

## 2014-07-22 DIAGNOSIS — J45909 Unspecified asthma, uncomplicated: Secondary | ICD-10-CM | POA: Diagnosis not present

## 2014-07-22 DIAGNOSIS — I82402 Acute embolism and thrombosis of unspecified deep veins of left lower extremity: Secondary | ICD-10-CM | POA: Diagnosis not present

## 2014-07-22 DIAGNOSIS — F0391 Unspecified dementia with behavioral disturbance: Secondary | ICD-10-CM | POA: Diagnosis not present

## 2014-07-22 DIAGNOSIS — E46 Unspecified protein-calorie malnutrition: Secondary | ICD-10-CM | POA: Diagnosis not present

## 2014-07-23 DIAGNOSIS — F0391 Unspecified dementia with behavioral disturbance: Secondary | ICD-10-CM | POA: Diagnosis not present

## 2014-07-23 DIAGNOSIS — I82409 Acute embolism and thrombosis of unspecified deep veins of unspecified lower extremity: Secondary | ICD-10-CM | POA: Diagnosis not present

## 2014-07-23 DIAGNOSIS — N186 End stage renal disease: Secondary | ICD-10-CM | POA: Diagnosis not present

## 2014-07-23 DIAGNOSIS — I120 Hypertensive chronic kidney disease with stage 5 chronic kidney disease or end stage renal disease: Secondary | ICD-10-CM | POA: Diagnosis not present

## 2014-07-23 DIAGNOSIS — Z5181 Encounter for therapeutic drug level monitoring: Secondary | ICD-10-CM | POA: Diagnosis not present

## 2014-07-23 DIAGNOSIS — I82402 Acute embolism and thrombosis of unspecified deep veins of left lower extremity: Secondary | ICD-10-CM | POA: Diagnosis not present

## 2014-07-23 DIAGNOSIS — F419 Anxiety disorder, unspecified: Secondary | ICD-10-CM | POA: Diagnosis not present

## 2014-07-23 DIAGNOSIS — J45909 Unspecified asthma, uncomplicated: Secondary | ICD-10-CM | POA: Diagnosis not present

## 2014-07-23 DIAGNOSIS — D649 Anemia, unspecified: Secondary | ICD-10-CM | POA: Diagnosis not present

## 2014-07-23 DIAGNOSIS — J4520 Mild intermittent asthma, uncomplicated: Secondary | ICD-10-CM | POA: Diagnosis not present

## 2014-07-23 DIAGNOSIS — Z7901 Long term (current) use of anticoagulants: Secondary | ICD-10-CM | POA: Diagnosis not present

## 2014-07-23 DIAGNOSIS — E46 Unspecified protein-calorie malnutrition: Secondary | ICD-10-CM | POA: Diagnosis not present

## 2014-07-23 DIAGNOSIS — I10 Essential (primary) hypertension: Secondary | ICD-10-CM | POA: Diagnosis not present

## 2014-07-24 DIAGNOSIS — Z7901 Long term (current) use of anticoagulants: Secondary | ICD-10-CM | POA: Diagnosis not present

## 2014-07-24 DIAGNOSIS — N186 End stage renal disease: Secondary | ICD-10-CM | POA: Diagnosis not present

## 2014-07-24 DIAGNOSIS — J45909 Unspecified asthma, uncomplicated: Secondary | ICD-10-CM | POA: Diagnosis not present

## 2014-07-24 DIAGNOSIS — Z5181 Encounter for therapeutic drug level monitoring: Secondary | ICD-10-CM | POA: Diagnosis not present

## 2014-07-24 DIAGNOSIS — F0391 Unspecified dementia with behavioral disturbance: Secondary | ICD-10-CM | POA: Diagnosis not present

## 2014-07-24 DIAGNOSIS — I120 Hypertensive chronic kidney disease with stage 5 chronic kidney disease or end stage renal disease: Secondary | ICD-10-CM | POA: Diagnosis not present

## 2014-07-24 DIAGNOSIS — D688 Other specified coagulation defects: Secondary | ICD-10-CM | POA: Diagnosis not present

## 2014-07-24 DIAGNOSIS — T8249XA Other complication of vascular dialysis catheter, initial encounter: Secondary | ICD-10-CM | POA: Diagnosis not present

## 2014-07-24 DIAGNOSIS — F419 Anxiety disorder, unspecified: Secondary | ICD-10-CM | POA: Diagnosis not present

## 2014-07-24 DIAGNOSIS — D631 Anemia in chronic kidney disease: Secondary | ICD-10-CM | POA: Diagnosis not present

## 2014-07-24 DIAGNOSIS — D509 Iron deficiency anemia, unspecified: Secondary | ICD-10-CM | POA: Diagnosis not present

## 2014-07-24 DIAGNOSIS — D649 Anemia, unspecified: Secondary | ICD-10-CM | POA: Diagnosis not present

## 2014-07-24 DIAGNOSIS — E46 Unspecified protein-calorie malnutrition: Secondary | ICD-10-CM | POA: Diagnosis not present

## 2014-07-24 DIAGNOSIS — I82402 Acute embolism and thrombosis of unspecified deep veins of left lower extremity: Secondary | ICD-10-CM | POA: Diagnosis not present

## 2014-07-25 DIAGNOSIS — J45909 Unspecified asthma, uncomplicated: Secondary | ICD-10-CM | POA: Diagnosis not present

## 2014-07-25 DIAGNOSIS — F419 Anxiety disorder, unspecified: Secondary | ICD-10-CM | POA: Diagnosis not present

## 2014-07-25 DIAGNOSIS — I120 Hypertensive chronic kidney disease with stage 5 chronic kidney disease or end stage renal disease: Secondary | ICD-10-CM | POA: Diagnosis not present

## 2014-07-25 DIAGNOSIS — D649 Anemia, unspecified: Secondary | ICD-10-CM | POA: Diagnosis not present

## 2014-07-25 DIAGNOSIS — I82402 Acute embolism and thrombosis of unspecified deep veins of left lower extremity: Secondary | ICD-10-CM | POA: Diagnosis not present

## 2014-07-25 DIAGNOSIS — Z7901 Long term (current) use of anticoagulants: Secondary | ICD-10-CM | POA: Diagnosis not present

## 2014-07-25 DIAGNOSIS — F0391 Unspecified dementia with behavioral disturbance: Secondary | ICD-10-CM | POA: Diagnosis not present

## 2014-07-25 DIAGNOSIS — E46 Unspecified protein-calorie malnutrition: Secondary | ICD-10-CM | POA: Diagnosis not present

## 2014-07-25 DIAGNOSIS — N186 End stage renal disease: Secondary | ICD-10-CM | POA: Diagnosis not present

## 2014-07-25 DIAGNOSIS — Z5181 Encounter for therapeutic drug level monitoring: Secondary | ICD-10-CM | POA: Diagnosis not present

## 2014-07-26 DIAGNOSIS — D688 Other specified coagulation defects: Secondary | ICD-10-CM | POA: Diagnosis not present

## 2014-07-26 DIAGNOSIS — D631 Anemia in chronic kidney disease: Secondary | ICD-10-CM | POA: Diagnosis not present

## 2014-07-26 DIAGNOSIS — T8249XA Other complication of vascular dialysis catheter, initial encounter: Secondary | ICD-10-CM | POA: Diagnosis not present

## 2014-07-26 DIAGNOSIS — N186 End stage renal disease: Secondary | ICD-10-CM | POA: Diagnosis not present

## 2014-07-26 DIAGNOSIS — D509 Iron deficiency anemia, unspecified: Secondary | ICD-10-CM | POA: Diagnosis not present

## 2014-07-28 DIAGNOSIS — N186 End stage renal disease: Secondary | ICD-10-CM | POA: Diagnosis not present

## 2014-07-28 DIAGNOSIS — F0391 Unspecified dementia with behavioral disturbance: Secondary | ICD-10-CM | POA: Diagnosis not present

## 2014-07-28 DIAGNOSIS — Z5181 Encounter for therapeutic drug level monitoring: Secondary | ICD-10-CM | POA: Diagnosis not present

## 2014-07-28 DIAGNOSIS — E46 Unspecified protein-calorie malnutrition: Secondary | ICD-10-CM | POA: Diagnosis not present

## 2014-07-28 DIAGNOSIS — Z7901 Long term (current) use of anticoagulants: Secondary | ICD-10-CM | POA: Diagnosis not present

## 2014-07-28 DIAGNOSIS — D649 Anemia, unspecified: Secondary | ICD-10-CM | POA: Diagnosis not present

## 2014-07-28 DIAGNOSIS — J45909 Unspecified asthma, uncomplicated: Secondary | ICD-10-CM | POA: Diagnosis not present

## 2014-07-28 DIAGNOSIS — F419 Anxiety disorder, unspecified: Secondary | ICD-10-CM | POA: Diagnosis not present

## 2014-07-28 DIAGNOSIS — I120 Hypertensive chronic kidney disease with stage 5 chronic kidney disease or end stage renal disease: Secondary | ICD-10-CM | POA: Diagnosis not present

## 2014-07-28 DIAGNOSIS — I82402 Acute embolism and thrombosis of unspecified deep veins of left lower extremity: Secondary | ICD-10-CM | POA: Diagnosis not present

## 2014-07-29 ENCOUNTER — Encounter (HOSPITAL_COMMUNITY): Payer: Self-pay | Admitting: Emergency Medicine

## 2014-07-29 ENCOUNTER — Emergency Department (HOSPITAL_COMMUNITY): Payer: Medicare Other

## 2014-07-29 ENCOUNTER — Emergency Department (HOSPITAL_COMMUNITY)
Admission: EM | Admit: 2014-07-29 | Discharge: 2014-07-29 | Disposition: A | Discharge status: Home / Self Care | Payer: Medicare Other | Attending: Emergency Medicine | Admitting: Emergency Medicine

## 2014-07-29 DIAGNOSIS — F0390 Unspecified dementia without behavioral disturbance: Secondary | ICD-10-CM | POA: Insufficient documentation

## 2014-07-29 DIAGNOSIS — Z8739 Personal history of other diseases of the musculoskeletal system and connective tissue: Secondary | ICD-10-CM | POA: Diagnosis not present

## 2014-07-29 DIAGNOSIS — N185 Chronic kidney disease, stage 5: Secondary | ICD-10-CM | POA: Diagnosis not present

## 2014-07-29 DIAGNOSIS — Z79899 Other long term (current) drug therapy: Secondary | ICD-10-CM | POA: Insufficient documentation

## 2014-07-29 DIAGNOSIS — N186 End stage renal disease: Secondary | ICD-10-CM | POA: Diagnosis not present

## 2014-07-29 DIAGNOSIS — Z992 Dependence on renal dialysis: Secondary | ICD-10-CM | POA: Diagnosis not present

## 2014-07-29 DIAGNOSIS — D688 Other specified coagulation defects: Secondary | ICD-10-CM | POA: Diagnosis not present

## 2014-07-29 DIAGNOSIS — D631 Anemia in chronic kidney disease: Secondary | ICD-10-CM | POA: Diagnosis not present

## 2014-07-29 DIAGNOSIS — I120 Hypertensive chronic kidney disease with stage 5 chronic kidney disease or end stage renal disease: Secondary | ICD-10-CM | POA: Diagnosis not present

## 2014-07-29 DIAGNOSIS — Z8639 Personal history of other endocrine, nutritional and metabolic disease: Secondary | ICD-10-CM | POA: Insufficient documentation

## 2014-07-29 DIAGNOSIS — R079 Chest pain, unspecified: Secondary | ICD-10-CM | POA: Diagnosis not present

## 2014-07-29 DIAGNOSIS — R0789 Other chest pain: Secondary | ICD-10-CM | POA: Diagnosis not present

## 2014-07-29 DIAGNOSIS — R011 Cardiac murmur, unspecified: Secondary | ICD-10-CM | POA: Diagnosis not present

## 2014-07-29 DIAGNOSIS — I10 Essential (primary) hypertension: Secondary | ICD-10-CM | POA: Diagnosis not present

## 2014-07-29 DIAGNOSIS — D509 Iron deficiency anemia, unspecified: Secondary | ICD-10-CM | POA: Diagnosis not present

## 2014-07-29 DIAGNOSIS — T8249XA Other complication of vascular dialysis catheter, initial encounter: Secondary | ICD-10-CM | POA: Diagnosis not present

## 2014-07-29 HISTORY — DX: Unspecified dementia, unspecified severity, without behavioral disturbance, psychotic disturbance, mood disturbance, and anxiety: F03.90

## 2014-07-29 LAB — CBC WITH DIFFERENTIAL/PLATELET
Basophils Absolute: 0 10*3/uL (ref 0.0–0.1)
Basophils Relative: 0 % (ref 0–1)
Eosinophils Absolute: 0.1 10*3/uL (ref 0.0–0.7)
Eosinophils Relative: 1 % (ref 0–5)
HCT: 29.8 % — ABNORMAL LOW (ref 36.0–46.0)
Hemoglobin: 9.5 g/dL — ABNORMAL LOW (ref 12.0–15.0)
Lymphocytes Relative: 37 % (ref 12–46)
Lymphs Abs: 2.4 10*3/uL (ref 0.7–4.0)
MCH: 32 pg (ref 26.0–34.0)
MCHC: 31.9 g/dL (ref 30.0–36.0)
MCV: 100.3 fL — ABNORMAL HIGH (ref 78.0–100.0)
Monocytes Absolute: 0.4 10*3/uL (ref 0.1–1.0)
Monocytes Relative: 7 % (ref 3–12)
Neutro Abs: 3.6 10*3/uL (ref 1.7–7.7)
Neutrophils Relative %: 55 % (ref 43–77)
Platelets: 231 10*3/uL (ref 150–400)
RBC: 2.97 MIL/uL — ABNORMAL LOW (ref 3.87–5.11)
RDW: 18.6 % — ABNORMAL HIGH (ref 11.5–15.5)
WBC: 6.5 10*3/uL (ref 4.0–10.5)

## 2014-07-29 LAB — I-STAT TROPONIN, ED: Troponin i, poc: 0.01 ng/mL (ref 0.00–0.08)

## 2014-07-29 LAB — COMPREHENSIVE METABOLIC PANEL
ALK PHOS: 64 U/L (ref 39–117)
ALT: 39 U/L — ABNORMAL HIGH (ref 0–35)
ANION GAP: 8 (ref 5–15)
AST: 41 U/L — ABNORMAL HIGH (ref 0–37)
Albumin: 2.5 g/dL — ABNORMAL LOW (ref 3.5–5.2)
BUN: 20 mg/dL (ref 6–23)
CO2: 30 mmol/L (ref 19–32)
Calcium: 7.8 mg/dL — ABNORMAL LOW (ref 8.4–10.5)
Chloride: 102 mEq/L (ref 96–112)
Creatinine, Ser: 3.1 mg/dL — ABNORMAL HIGH (ref 0.50–1.10)
GFR calc Af Amer: 15 mL/min — ABNORMAL LOW (ref >90)
GFR, EST NON AFRICAN AMERICAN: 13 mL/min — AB (ref >90)
GLUCOSE: 137 mg/dL — AB (ref 70–99)
POTASSIUM: 3.4 mmol/L — AB (ref 3.5–5.1)
SODIUM: 140 mmol/L (ref 135–145)
TOTAL PROTEIN: 6.1 g/dL (ref 6.0–8.3)
Total Bilirubin: 0.7 mg/dL (ref 0.3–1.2)

## 2014-07-29 NOTE — ED Notes (Addendum)
Daughter states she picked pt up from dialysis and she has holding her chest and stated her chest was hurting. Pt pointed under her left breast and states she had some pain there but isn't hurting now. Pt has history of dementia.

## 2014-07-29 NOTE — ED Provider Notes (Signed)
  Face-to-face evaluation   History:  Patient developed chest pain tonight after dialysis, at 4 PM.  She was eating dinner at the time.  Chest pain resolved spontaneously after about 2 hours.  She's not had this previously.  There have been no other complaints.   Physical exam: Alert, cooperative.  Heart regular rate and rhythm, no murmur.  Lungs clear to auscultation.  Skin 2 small circular bruises about 0.5 cm, of the upper chest.  Centrally, and the right dorsal wrist.  No other rash or bruising is seen on general examination.  Medical screening examination/treatment/procedure(s) were conducted as a shared visit with non-physician practitioner(s) and myself.  I personally evaluated the patient during the encounter  Flint MelterElliott L Tiffany Calmes, MD 07/29/14 2023

## 2014-07-29 NOTE — Discharge Instructions (Signed)

## 2014-07-29 NOTE — ED Provider Notes (Signed)
CSN: [id number]     Arrival date & time 07/29/14  1808 History   First MD Initiated Contact with Patient 07/29/14 1826     Chief Complaint  Patient presents with  . Chest Pain   HPI  Patient is a 81 year old female with past medical history of chronic kidney disease on dialysis Tuesday Thursday and Saturday, hypertension, hyperlipidemia, and dementia who presents emergency room for evaluation of chest pain which started at 4 PM today. Patient was brought by her sister who reports that the patient has been complaining about chest pain since leaving dialysis. Patient is unable to report anything about her chest pain. She is currently denying any chest pain. She states that she has had no difficulty with her breathing. Patient has no history of cardiac problems.  This is the Patient is a very poor historian.  Past Medical History  Diagnosis Date  . Gout   . CKD (chronic kidney disease) stage V requiring chronic dialysis   . Hypertension   . Hyperlipemia   . Dementia    Past Surgical History  Procedure Laterality Date  . Tubal ligation    . Av fistula placement     Family History  Problem Relation Age of Onset  . Colon cancer Neg Hx   . Diabetes Mellitus II Neg Hx    History  Substance Use Topics  . Smoking status: Never Smoker   . Smokeless tobacco: Never Used  . Alcohol Use: No   OB History    No data available     Review of Systems  Constitutional: Negative for fever, chills and fatigue.  Respiratory: Negative for chest tightness and shortness of breath.   Cardiovascular: Positive for chest pain. Negative for palpitations.  Gastrointestinal: Negative for nausea, vomiting, abdominal pain, diarrhea and constipation.  Neurological: Negative for dizziness and numbness.  All other systems reviewed and are negative.     Allergies  Nsaids  Home Medications   Prior to Admission medications   Medication Sig Start Date End Date Taking? Authorizing Provider  cinacalcet  (SENSIPAR) 30 MG tablet Take 1 tablet (30 mg total) by mouth daily with breakfast. 06/09/14  Yes [name], PA-C  guaifenesin (ROBITUSSIN) 100 MG/5ML syrup Take 10 mLs (200 mg total) by mouth every 4 (four) hours while awake. 07/09/14  Yes [name], MD  HYDROcodone-acetaminophen (NORCO/VICODIN) 5-325 MG per tablet Take 1-2 tablets by mouth every 4 (four) hours as needed for moderate pain or severe pain. 05/10/14  Yes Olivia Mackie, MD  midodrine (PROAMATINE) 10 MG tablet Take 1 tablet (10 mg total) by mouth 3 (three) times daily with meals. 07/09/14  Yes [name], MD  multivitamin (RENA-VIT) TABS tablet Take 1 tablet by mouth at bedtime.    Yes Historical Provider, MD  Nutritional Supplements (FEEDING SUPPLEMENT, NEPRO CARB STEADY,) LIQD Take 237 mLs by mouth 2 (two) times daily between meals. 06/09/14  Yes [name], PA-C  warfarin (COUMADIN) 4 MG tablet Take 1 tablet (4 mg total) by mouth every evening. 07/11/14  Yes [name], MD  albuterol (PROAIR HFA) 108 (90 BASE) MCG/ACT inhaler Inhale 1 puff into the lungs every 6 (six) hours as needed for wheezing or shortness of breath.     Historical Provider, MD  amoxicillin-clavulanate (AUGMENTIN) 875-125 MG per tablet Take 1 tablet by mouth 2 (two) times daily. 07/17/14   Clydia Llano, MD  LORazepam (ATIVAN) 0.5 MG tablet Take 1 tablet (0.5 mg total) by mouth every  12 (twelve) hours as needed for anxiety. Also-may give 1 hour prior to dialysis 06/23/14   Rhetta Mura, MD  nitroGLYCERIN (NITROSTAT) 0.4 MG SL tablet Place 0.4 mg under the tongue every 5 (five) minutes as needed for chest pain.    Historical Provider, MD  traMADol (ULTRAM) 50 MG tablet Take 1 tablet (50 mg total) by mouth every 6 (six) hours as needed for moderate pain. 06/23/14   Rhetta Mura, MD   BP 91/42 mmHg  Pulse 81  Temp(Src) 97.3 F (36.3 C) (Oral)  Resp 18  Ht  (1.575 m)  Wt 116 lb (52.617 kg)  BMI 21.21 kg/m2  SpO2  100% Physical Exam  Constitutional: She is oriented to person, place, and time. She appears well-developed and well-nourished. No distress.  HENT:  Head: Normocephalic and atraumatic.  Mouth/Throat: Oropharynx is clear and moist. No oropharyngeal exudate.  Eyes: Conjunctivae and EOM are normal. Pupils are equal, round, and reactive to light. No scleral icterus.  Neck: Normal range of motion. Neck supple. No JVD present. No thyromegaly present.  Cardiovascular: Normal rate, regular rhythm and intact distal pulses.  Exam reveals no gallop and no friction rub.   Murmur (3/6 blowing murmur heard best at the left intercostal border) heard. Pulmonary/Chest: Effort normal and breath sounds normal. No respiratory distress. She has no wheezes. She has no rales. She exhibits no tenderness.  Abdominal: Soft. Bowel sounds are normal. She exhibits no distension and no mass. There is no tenderness. There is no rebound and no guarding.  Musculoskeletal: Normal range of motion.  Lymphadenopathy:    She has no cervical adenopathy.  Neurological: She is alert and oriented to person, place, and time. She has normal strength. No cranial nerve deficit or sensory deficit. Coordination normal.  Skin: Skin is warm and dry. She is not diaphoretic.  Psychiatric: She has a normal mood and affect. Her behavior is normal. Judgment and thought content normal.  Nursing note and vitals reviewed.   ED Course  Procedures (including critical care time) Labs Review Labs Reviewed  CBC WITH DIFFERENTIAL - Abnormal; Notable for the following:    RBC 2.97 (*)    Hemoglobin 9.5 (*)    HCT 29.8 (*)    MCV 100.3 (*)    RDW 18.6 (*)    All other components within normal limits  COMPREHENSIVE METABOLIC PANEL - Abnormal; Notable for the following:    Potassium 3.4 (*)    Glucose, Bld 137 (*)    Creatinine, Ser 3.10 (*)    Calcium 7.8 (*)    Albumin 2.5 (*)    AST 41 (*)    ALT 39 (*)    GFR calc non Af Amer 13 (*)    GFR  calc Af Amer 15 (*)    All other components within normal limits  I-STAT TROPOININ, ED    Imaging Review Dg Chest 2 View  07/29/2014   CLINICAL DATA:  Chest pressure and hypertension, history of renal failure on dialysis  EXAM: CHEST  2 VIEW  COMPARISON:  07/12/2014  FINDINGS: Cardiac shadow remains enlarged. A dialysis catheter is again noted. The lungs are well aerated bilaterally. Minimal blunting of the left costophrenic angle remains. No sizable effusion is seen. No acute bony abnormality is noted.  IMPRESSION: Stable appearance when compare with the prior exam. No acute abnormality noted.   Electronically Signed   By: Alcide Clever M.D.   On: 07/29/2014 20:14     EKG Interpretation  Date/Time:  Tuesday July 29 2014 18:17:15 EST Ventricular Rate:  90 PR Interval:  142 QRS Duration: 100 QT Interval:  410 QTC Calculation: 502 R Axis:   -38 Text Interpretation:  Sinus rhythm Incomplete RBBB and LAFB RSR' in V1 or  V2, right VCD or RVH Prolonged QT interval Since last tracing QT has  shortened Confirmed by Effie ShyWENTZ  MD, Mechele CollinELLIOTT ([id number]) on 07/29/2014 6:57:08 PM      MDM   Final diagnoses:  Chest pain   Patient is a 81 year old female who presents emergency room for evaluation of chest pain. Patient is currently pain-free on my examination. She is a poor historian and is unable to give me many details of her history. Physical exam is unremarkable. I-STAT troponin is negative. Basic labs appear to be at baseline. Chest x-ray has had no changes from prior. EKG reveals mild shortening of CT no other acute changes. Likely that this was possible GERD versus atypical chest pain. Pain has resolved completely. We'll discharge patient home at this time. Patient return for worsening shortness of breath, worsening chest pain, or any other concerning symptoms. Patient seen by and discussed with Dr. Effie ShyWentz who agrees with the above workup and plan. Patient is stable for discharge at this  time.    Eben Burowourtney A Forcucci, PA-C 07/29/14 2037  Flint MelterElliott L Wentz, MD 07/29/14 [phone]

## 2014-07-30 DIAGNOSIS — F419 Anxiety disorder, unspecified: Secondary | ICD-10-CM | POA: Diagnosis not present

## 2014-07-30 DIAGNOSIS — E46 Unspecified protein-calorie malnutrition: Secondary | ICD-10-CM | POA: Diagnosis not present

## 2014-07-30 DIAGNOSIS — I82402 Acute embolism and thrombosis of unspecified deep veins of left lower extremity: Secondary | ICD-10-CM | POA: Diagnosis not present

## 2014-07-30 DIAGNOSIS — Z7901 Long term (current) use of anticoagulants: Secondary | ICD-10-CM | POA: Diagnosis not present

## 2014-07-30 DIAGNOSIS — N186 End stage renal disease: Secondary | ICD-10-CM | POA: Diagnosis not present

## 2014-07-30 DIAGNOSIS — D649 Anemia, unspecified: Secondary | ICD-10-CM | POA: Diagnosis not present

## 2014-07-30 DIAGNOSIS — Z5181 Encounter for therapeutic drug level monitoring: Secondary | ICD-10-CM | POA: Diagnosis not present

## 2014-07-30 DIAGNOSIS — J45909 Unspecified asthma, uncomplicated: Secondary | ICD-10-CM | POA: Diagnosis not present

## 2014-07-30 DIAGNOSIS — F0391 Unspecified dementia with behavioral disturbance: Secondary | ICD-10-CM | POA: Diagnosis not present

## 2014-07-30 DIAGNOSIS — I120 Hypertensive chronic kidney disease with stage 5 chronic kidney disease or end stage renal disease: Secondary | ICD-10-CM | POA: Diagnosis not present

## 2014-07-31 ENCOUNTER — Encounter: Payer: Self-pay | Admitting: Surgery

## 2014-07-31 DIAGNOSIS — D688 Other specified coagulation defects: Secondary | ICD-10-CM | POA: Diagnosis not present

## 2014-07-31 DIAGNOSIS — D631 Anemia in chronic kidney disease: Secondary | ICD-10-CM | POA: Diagnosis not present

## 2014-07-31 DIAGNOSIS — D509 Iron deficiency anemia, unspecified: Secondary | ICD-10-CM | POA: Diagnosis not present

## 2014-07-31 DIAGNOSIS — N186 End stage renal disease: Secondary | ICD-10-CM | POA: Diagnosis not present

## 2014-07-31 DIAGNOSIS — T8249XA Other complication of vascular dialysis catheter, initial encounter: Secondary | ICD-10-CM | POA: Diagnosis not present

## 2014-08-04 ENCOUNTER — Ambulatory Visit: Payer: 59 | Admitting: Surgery

## 2014-08-04 DIAGNOSIS — I120 Hypertensive chronic kidney disease with stage 5 chronic kidney disease or end stage renal disease: Secondary | ICD-10-CM | POA: Diagnosis not present

## 2014-08-04 DIAGNOSIS — E46 Unspecified protein-calorie malnutrition: Secondary | ICD-10-CM | POA: Diagnosis not present

## 2014-08-04 DIAGNOSIS — Z7901 Long term (current) use of anticoagulants: Secondary | ICD-10-CM | POA: Diagnosis not present

## 2014-08-04 DIAGNOSIS — F419 Anxiety disorder, unspecified: Secondary | ICD-10-CM | POA: Diagnosis not present

## 2014-08-04 DIAGNOSIS — Z5181 Encounter for therapeutic drug level monitoring: Secondary | ICD-10-CM | POA: Diagnosis not present

## 2014-08-04 DIAGNOSIS — I82402 Acute embolism and thrombosis of unspecified deep veins of left lower extremity: Secondary | ICD-10-CM | POA: Diagnosis not present

## 2014-08-04 DIAGNOSIS — N186 End stage renal disease: Secondary | ICD-10-CM | POA: Diagnosis not present

## 2014-08-04 DIAGNOSIS — Z961 Presence of intraocular lens: Secondary | ICD-10-CM | POA: Diagnosis not present

## 2014-08-04 DIAGNOSIS — J45909 Unspecified asthma, uncomplicated: Secondary | ICD-10-CM | POA: Diagnosis not present

## 2014-08-04 DIAGNOSIS — D649 Anemia, unspecified: Secondary | ICD-10-CM | POA: Diagnosis not present

## 2014-08-04 DIAGNOSIS — F0391 Unspecified dementia with behavioral disturbance: Secondary | ICD-10-CM | POA: Diagnosis not present

## 2014-08-05 DIAGNOSIS — D688 Other specified coagulation defects: Secondary | ICD-10-CM | POA: Diagnosis not present

## 2014-08-05 DIAGNOSIS — T8249XA Other complication of vascular dialysis catheter, initial encounter: Secondary | ICD-10-CM | POA: Diagnosis not present

## 2014-08-05 DIAGNOSIS — D631 Anemia in chronic kidney disease: Secondary | ICD-10-CM | POA: Diagnosis not present

## 2014-08-05 DIAGNOSIS — N186 End stage renal disease: Secondary | ICD-10-CM | POA: Diagnosis not present

## 2014-08-05 DIAGNOSIS — D509 Iron deficiency anemia, unspecified: Secondary | ICD-10-CM | POA: Diagnosis not present

## 2014-08-07 DIAGNOSIS — T8249XA Other complication of vascular dialysis catheter, initial encounter: Secondary | ICD-10-CM | POA: Diagnosis not present

## 2014-08-07 DIAGNOSIS — D631 Anemia in chronic kidney disease: Secondary | ICD-10-CM | POA: Diagnosis not present

## 2014-08-07 DIAGNOSIS — D688 Other specified coagulation defects: Secondary | ICD-10-CM | POA: Diagnosis not present

## 2014-08-07 DIAGNOSIS — D509 Iron deficiency anemia, unspecified: Secondary | ICD-10-CM | POA: Diagnosis not present

## 2014-08-07 DIAGNOSIS — N19 Unspecified kidney failure: Secondary | ICD-10-CM | POA: Diagnosis not present

## 2014-08-07 DIAGNOSIS — N186 End stage renal disease: Secondary | ICD-10-CM | POA: Diagnosis not present

## 2014-08-08 DIAGNOSIS — I82402 Acute embolism and thrombosis of unspecified deep veins of left lower extremity: Secondary | ICD-10-CM | POA: Diagnosis not present

## 2014-08-08 DIAGNOSIS — I120 Hypertensive chronic kidney disease with stage 5 chronic kidney disease or end stage renal disease: Secondary | ICD-10-CM | POA: Diagnosis not present

## 2014-08-08 DIAGNOSIS — Z5181 Encounter for therapeutic drug level monitoring: Secondary | ICD-10-CM | POA: Diagnosis not present

## 2014-08-08 DIAGNOSIS — N186 End stage renal disease: Secondary | ICD-10-CM | POA: Diagnosis not present

## 2014-08-08 DIAGNOSIS — D649 Anemia, unspecified: Secondary | ICD-10-CM | POA: Diagnosis not present

## 2014-08-08 DIAGNOSIS — Z7901 Long term (current) use of anticoagulants: Secondary | ICD-10-CM | POA: Diagnosis not present

## 2014-08-08 DIAGNOSIS — F0391 Unspecified dementia with behavioral disturbance: Secondary | ICD-10-CM | POA: Diagnosis not present

## 2014-08-08 DIAGNOSIS — F419 Anxiety disorder, unspecified: Secondary | ICD-10-CM | POA: Diagnosis not present

## 2014-08-08 DIAGNOSIS — E46 Unspecified protein-calorie malnutrition: Secondary | ICD-10-CM | POA: Diagnosis not present

## 2014-08-08 DIAGNOSIS — J45909 Unspecified asthma, uncomplicated: Secondary | ICD-10-CM | POA: Diagnosis not present

## 2014-08-09 ENCOUNTER — Encounter (HOSPITAL_COMMUNITY): Payer: Self-pay | Admitting: *Deleted

## 2014-08-09 ENCOUNTER — Inpatient Hospital Stay (HOSPITAL_COMMUNITY)
Admission: EM | Admit: 2014-08-09 | Discharge: 2014-08-14 | DRG: 391 | Disposition: A | Discharge status: Home / Self Care | Payer: Medicare Other | Attending: Internal Medicine | Admitting: Internal Medicine

## 2014-08-09 DIAGNOSIS — E860 Dehydration: Secondary | ICD-10-CM | POA: Diagnosis present

## 2014-08-09 DIAGNOSIS — F0391 Unspecified dementia with behavioral disturbance: Secondary | ICD-10-CM | POA: Diagnosis present

## 2014-08-09 DIAGNOSIS — R197 Diarrhea, unspecified: Secondary | ICD-10-CM | POA: Diagnosis not present

## 2014-08-09 DIAGNOSIS — I959 Hypotension, unspecified: Secondary | ICD-10-CM | POA: Diagnosis present

## 2014-08-09 DIAGNOSIS — F0390 Unspecified dementia without behavioral disturbance: Secondary | ICD-10-CM | POA: Diagnosis present

## 2014-08-09 DIAGNOSIS — R109 Unspecified abdominal pain: Secondary | ICD-10-CM

## 2014-08-09 DIAGNOSIS — E785 Hyperlipidemia, unspecified: Secondary | ICD-10-CM | POA: Diagnosis present

## 2014-08-09 DIAGNOSIS — Z66 Do not resuscitate: Secondary | ICD-10-CM | POA: Diagnosis present

## 2014-08-09 DIAGNOSIS — K559 Vascular disorder of intestine, unspecified: Secondary | ICD-10-CM | POA: Diagnosis present

## 2014-08-09 DIAGNOSIS — Z992 Dependence on renal dialysis: Secondary | ICD-10-CM

## 2014-08-09 DIAGNOSIS — I120 Hypertensive chronic kidney disease with stage 5 chronic kidney disease or end stage renal disease: Secondary | ICD-10-CM | POA: Diagnosis present

## 2014-08-09 DIAGNOSIS — M109 Gout, unspecified: Secondary | ICD-10-CM | POA: Diagnosis not present

## 2014-08-09 DIAGNOSIS — I82409 Acute embolism and thrombosis of unspecified deep veins of unspecified lower extremity: Secondary | ICD-10-CM | POA: Diagnosis present

## 2014-08-09 DIAGNOSIS — A09 Infectious gastroenteritis and colitis, unspecified: Secondary | ICD-10-CM | POA: Diagnosis present

## 2014-08-09 DIAGNOSIS — K529 Noninfective gastroenteritis and colitis, unspecified: Secondary | ICD-10-CM | POA: Diagnosis not present

## 2014-08-09 DIAGNOSIS — N186 End stage renal disease: Secondary | ICD-10-CM | POA: Diagnosis not present

## 2014-08-09 DIAGNOSIS — K8020 Calculus of gallbladder without cholecystitis without obstruction: Secondary | ICD-10-CM | POA: Diagnosis not present

## 2014-08-09 DIAGNOSIS — D649 Anemia, unspecified: Secondary | ICD-10-CM | POA: Diagnosis not present

## 2014-08-09 DIAGNOSIS — Z86718 Personal history of other venous thrombosis and embolism: Secondary | ICD-10-CM | POA: Diagnosis not present

## 2014-08-09 DIAGNOSIS — K219 Gastro-esophageal reflux disease without esophagitis: Secondary | ICD-10-CM | POA: Diagnosis present

## 2014-08-09 DIAGNOSIS — Z888 Allergy status to other drugs, medicaments and biological substances status: Secondary | ICD-10-CM | POA: Diagnosis not present

## 2014-08-09 DIAGNOSIS — Z7901 Long term (current) use of anticoagulants: Secondary | ICD-10-CM

## 2014-08-09 DIAGNOSIS — K567 Ileus, unspecified: Secondary | ICD-10-CM | POA: Diagnosis not present

## 2014-08-09 DIAGNOSIS — N189 Chronic kidney disease, unspecified: Secondary | ICD-10-CM

## 2014-08-09 DIAGNOSIS — R1084 Generalized abdominal pain: Secondary | ICD-10-CM | POA: Diagnosis not present

## 2014-08-09 DIAGNOSIS — D631 Anemia in chronic kidney disease: Secondary | ICD-10-CM | POA: Diagnosis present

## 2014-08-09 DIAGNOSIS — N281 Cyst of kidney, acquired: Secondary | ICD-10-CM | POA: Diagnosis not present

## 2014-08-09 LAB — CBC WITH DIFFERENTIAL/PLATELET
Basophils Absolute: 0 10*3/uL (ref 0.0–0.1)
Basophils Relative: 0 % (ref 0–1)
EOS PCT: 0 % (ref 0–5)
Eosinophils Absolute: 0 10*3/uL (ref 0.0–0.7)
HCT: 37.5 % (ref 36.0–46.0)
Hemoglobin: 12 g/dL (ref 12.0–15.0)
Lymphocytes Relative: 16 % (ref 12–46)
Lymphs Abs: 2.7 10*3/uL (ref 0.7–4.0)
MCH: 32.4 pg (ref 26.0–34.0)
MCHC: 32 g/dL (ref 30.0–36.0)
MCV: 101.4 fL — ABNORMAL HIGH (ref 78.0–100.0)
Monocytes Absolute: 0.5 10*3/uL (ref 0.1–1.0)
Monocytes Relative: 3 % (ref 3–12)
Neutro Abs: 13.2 10*3/uL — ABNORMAL HIGH (ref 1.7–7.7)
Neutrophils Relative %: 81 % — ABNORMAL HIGH (ref 43–77)
PLATELETS: 270 10*3/uL (ref 150–400)
RBC: 3.7 MIL/uL — AB (ref 3.87–5.11)
RDW: 16.7 % — ABNORMAL HIGH (ref 11.5–15.5)
WBC: 16.4 10*3/uL — ABNORMAL HIGH (ref 4.0–10.5)

## 2014-08-09 LAB — COMPREHENSIVE METABOLIC PANEL
ALK PHOS: 61 U/L (ref 39–117)
ALT: 15 U/L (ref 0–35)
ANION GAP: 18 — AB (ref 5–15)
AST: 20 U/L (ref 0–37)
Albumin: 3.2 g/dL — ABNORMAL LOW (ref 3.5–5.2)
BUN: 39 mg/dL — ABNORMAL HIGH (ref 6–23)
CALCIUM: 9.8 mg/dL (ref 8.4–10.5)
CO2: 21 mmol/L (ref 19–32)
CREATININE: 6.47 mg/dL — AB (ref 0.50–1.10)
Chloride: 101 mmol/L (ref 96–112)
GFR calc non Af Amer: 5 mL/min — ABNORMAL LOW (ref >90)
GFR, EST AFRICAN AMERICAN: 6 mL/min — AB (ref >90)
Glucose, Bld: 101 mg/dL — ABNORMAL HIGH (ref 70–99)
Potassium: 4 mmol/L (ref 3.5–5.1)
Sodium: 140 mmol/L (ref 135–145)
Total Bilirubin: 0.9 mg/dL (ref 0.3–1.2)
Total Protein: 8.3 g/dL (ref 6.0–8.3)

## 2014-08-09 LAB — LIPASE, BLOOD: Lipase: 29 U/L (ref 11–59)

## 2014-08-09 MED ORDER — SODIUM CHLORIDE 0.9 % IV BOLUS (SEPSIS)
500.0000 mL | Freq: Once | INTRAVENOUS | Status: AC
  Administered 2014-08-09: 500 mL via INTRAVENOUS

## 2014-08-09 MED ORDER — ONDANSETRON 4 MG PO TBDP
8.0000 mg | ORAL_TABLET | Freq: Once | ORAL | Status: AC
  Administered 2014-08-09: 8 mg via ORAL
  Filled 2014-08-09: qty 2

## 2014-08-09 MED ORDER — MORPHINE SULFATE 4 MG/ML IJ SOLN
2.0000 mg | Freq: Once | INTRAMUSCULAR | Status: AC
  Administered 2014-08-09: 2 mg via INTRAVENOUS
  Filled 2014-08-09: qty 1

## 2014-08-09 MED ORDER — IOHEXOL 300 MG/ML  SOLN
25.0000 mL | INTRAMUSCULAR | Status: AC
  Administered 2014-08-09 – 2014-08-10 (×2): 25 mL via ORAL

## 2014-08-09 NOTE — ED Notes (Signed)
Pt in with family reporting n/v/d for the past three days, pt did not make it to dialysis today due to illness, pt alert and oriented, no distress noted

## 2014-08-10 ENCOUNTER — Emergency Department (HOSPITAL_COMMUNITY): Payer: Medicare Other

## 2014-08-10 DIAGNOSIS — I120 Hypertensive chronic kidney disease with stage 5 chronic kidney disease or end stage renal disease: Secondary | ICD-10-CM | POA: Diagnosis present

## 2014-08-10 DIAGNOSIS — K529 Noninfective gastroenteritis and colitis, unspecified: Principal | ICD-10-CM

## 2014-08-10 DIAGNOSIS — F0391 Unspecified dementia with behavioral disturbance: Secondary | ICD-10-CM

## 2014-08-10 DIAGNOSIS — M109 Gout, unspecified: Secondary | ICD-10-CM | POA: Diagnosis present

## 2014-08-10 DIAGNOSIS — N186 End stage renal disease: Secondary | ICD-10-CM | POA: Diagnosis not present

## 2014-08-10 DIAGNOSIS — E860 Dehydration: Secondary | ICD-10-CM | POA: Diagnosis present

## 2014-08-10 DIAGNOSIS — K8020 Calculus of gallbladder without cholecystitis without obstruction: Secondary | ICD-10-CM | POA: Diagnosis not present

## 2014-08-10 DIAGNOSIS — Z992 Dependence on renal dialysis: Secondary | ICD-10-CM

## 2014-08-10 DIAGNOSIS — K219 Gastro-esophageal reflux disease without esophagitis: Secondary | ICD-10-CM

## 2014-08-10 DIAGNOSIS — D631 Anemia in chronic kidney disease: Secondary | ICD-10-CM | POA: Diagnosis not present

## 2014-08-10 DIAGNOSIS — R109 Unspecified abdominal pain: Secondary | ICD-10-CM | POA: Diagnosis not present

## 2014-08-10 DIAGNOSIS — I82409 Acute embolism and thrombosis of unspecified deep veins of unspecified lower extremity: Secondary | ICD-10-CM

## 2014-08-10 DIAGNOSIS — A09 Infectious gastroenteritis and colitis, unspecified: Secondary | ICD-10-CM | POA: Diagnosis present

## 2014-08-10 DIAGNOSIS — Z86718 Personal history of other venous thrombosis and embolism: Secondary | ICD-10-CM | POA: Diagnosis not present

## 2014-08-10 DIAGNOSIS — N281 Cyst of kidney, acquired: Secondary | ICD-10-CM | POA: Diagnosis not present

## 2014-08-10 DIAGNOSIS — R197 Diarrhea, unspecified: Secondary | ICD-10-CM | POA: Diagnosis present

## 2014-08-10 DIAGNOSIS — K559 Vascular disorder of intestine, unspecified: Secondary | ICD-10-CM | POA: Diagnosis not present

## 2014-08-10 DIAGNOSIS — D649 Anemia, unspecified: Secondary | ICD-10-CM | POA: Diagnosis present

## 2014-08-10 DIAGNOSIS — E785 Hyperlipidemia, unspecified: Secondary | ICD-10-CM | POA: Diagnosis present

## 2014-08-10 DIAGNOSIS — Z888 Allergy status to other drugs, medicaments and biological substances status: Secondary | ICD-10-CM | POA: Diagnosis not present

## 2014-08-10 DIAGNOSIS — K567 Ileus, unspecified: Secondary | ICD-10-CM | POA: Diagnosis present

## 2014-08-10 DIAGNOSIS — Z66 Do not resuscitate: Secondary | ICD-10-CM | POA: Diagnosis present

## 2014-08-10 DIAGNOSIS — Z7901 Long term (current) use of anticoagulants: Secondary | ICD-10-CM | POA: Diagnosis not present

## 2014-08-10 LAB — COMPREHENSIVE METABOLIC PANEL
ALT: 12 U/L (ref 0–35)
AST: 18 U/L (ref 0–37)
Albumin: 2.6 g/dL — ABNORMAL LOW (ref 3.5–5.2)
Alkaline Phosphatase: 51 U/L (ref 39–117)
Anion gap: 15 (ref 5–15)
BUN: 42 mg/dL — ABNORMAL HIGH (ref 6–23)
CALCIUM: 8.5 mg/dL (ref 8.4–10.5)
CO2: 19 mmol/L (ref 19–32)
CREATININE: 6.33 mg/dL — AB (ref 0.50–1.10)
Chloride: 102 mmol/L (ref 96–112)
GFR calc Af Amer: 6 mL/min — ABNORMAL LOW (ref >90)
GFR calc non Af Amer: 6 mL/min — ABNORMAL LOW (ref >90)
Glucose, Bld: 70 mg/dL (ref 70–99)
Potassium: 3.4 mmol/L — ABNORMAL LOW (ref 3.5–5.1)
Sodium: 136 mmol/L (ref 135–145)
Total Bilirubin: 0.8 mg/dL (ref 0.3–1.2)
Total Protein: 6.2 g/dL (ref 6.0–8.3)

## 2014-08-10 LAB — CBC WITH DIFFERENTIAL/PLATELET
Basophils Absolute: 0 10*3/uL (ref 0.0–0.1)
Basophils Relative: 0 % (ref 0–1)
EOS ABS: 0.1 10*3/uL (ref 0.0–0.7)
Eosinophils Relative: 1 % (ref 0–5)
HCT: 30.9 % — ABNORMAL LOW (ref 36.0–46.0)
Hemoglobin: 9.8 g/dL — ABNORMAL LOW (ref 12.0–15.0)
Lymphocytes Relative: 22 % (ref 12–46)
Lymphs Abs: 2.5 10*3/uL (ref 0.7–4.0)
MCH: 31.2 pg (ref 26.0–34.0)
MCHC: 31.7 g/dL (ref 30.0–36.0)
MCV: 98.4 fL (ref 78.0–100.0)
MONOS PCT: 5 % (ref 3–12)
Monocytes Absolute: 0.6 10*3/uL (ref 0.1–1.0)
NEUTROS ABS: 8.1 10*3/uL — AB (ref 1.7–7.7)
Neutrophils Relative %: 71 % (ref 43–77)
Platelets: 207 10*3/uL (ref 150–400)
RBC: 3.14 MIL/uL — AB (ref 3.87–5.11)
RDW: 16.6 % — AB (ref 11.5–15.5)
WBC: 11.3 10*3/uL — ABNORMAL HIGH (ref 4.0–10.5)

## 2014-08-10 LAB — CLOSTRIDIUM DIFFICILE BY PCR: CDIFFPCR: NEGATIVE

## 2014-08-10 LAB — TYPE AND SCREEN
ABO/RH(D): O POS
Antibody Screen: NEGATIVE

## 2014-08-10 LAB — I-STAT CG4 LACTIC ACID, ED: Lactic Acid, Venous: 0.78 mmol/L (ref 0.5–2.0)

## 2014-08-10 LAB — PROTIME-INR
INR: 1.66 — AB (ref 0.00–1.49)
Prothrombin Time: 19.7 seconds — ABNORMAL HIGH (ref 11.6–15.2)

## 2014-08-10 MED ORDER — ALBUTEROL SULFATE (2.5 MG/3ML) 0.083% IN NEBU
3.0000 mL | INHALATION_SOLUTION | Freq: Four times a day (QID) | RESPIRATORY_TRACT | Status: DC | PRN
Start: 2014-08-10 — End: 2014-08-14

## 2014-08-10 MED ORDER — METRONIDAZOLE IN NACL 5-0.79 MG/ML-% IV SOLN
500.0000 mg | Freq: Once | INTRAVENOUS | Status: AC
Start: 2014-08-10 — End: 2014-08-10
  Administered 2014-08-10: 500 mg via INTRAVENOUS
  Filled 2014-08-10: qty 100

## 2014-08-10 MED ORDER — GUAIFENESIN 100 MG/5ML PO SYRP
200.0000 mg | ORAL_SOLUTION | ORAL | Status: DC
  Administered 2014-08-10 – 2014-08-14 (×15): 200 mg via ORAL
  Filled 2014-08-10 (×26): qty 10

## 2014-08-10 MED ORDER — CIPROFLOXACIN IN D5W 400 MG/200ML IV SOLN
400.0000 mg | INTRAVENOUS | Status: DC
  Administered 2014-08-11 – 2014-08-13 (×3): 400 mg via INTRAVENOUS
  Filled 2014-08-10 (×3): qty 200

## 2014-08-10 MED ORDER — ACETAMINOPHEN 650 MG RE SUPP: 650.0000 mg | Freq: Four times a day (QID) | RECTAL | Status: DC | PRN

## 2014-08-10 MED ORDER — HYDROCODONE-ACETAMINOPHEN 5-325 MG PO TABS
1.0000 | ORAL_TABLET | ORAL | Status: DC | PRN
Start: 2014-08-10 — End: 2014-08-14

## 2014-08-10 MED ORDER — SODIUM CHLORIDE 0.9 % IJ SOLN
3.0000 mL | Freq: Two times a day (BID) | INTRAMUSCULAR | Status: DC
  Administered 2014-08-10 – 2014-08-14 (×9): 3 mL via INTRAVENOUS

## 2014-08-10 MED ORDER — ACETAMINOPHEN 325 MG PO TABS: 650.0000 mg | ORAL_TABLET | Freq: Four times a day (QID) | ORAL | Status: DC | PRN

## 2014-08-10 MED ORDER — WARFARIN SODIUM 5 MG PO TABS
5.0000 mg | ORAL_TABLET | Freq: Once | ORAL | Status: AC
  Administered 2014-08-10: 5 mg via ORAL
  Filled 2014-08-10: qty 1

## 2014-08-10 MED ORDER — NITROGLYCERIN 0.4 MG SL SUBL: 0.4000 mg | SUBLINGUAL_TABLET | SUBLINGUAL | Status: DC | PRN

## 2014-08-10 MED ORDER — NEPRO/CARBSTEADY PO LIQD
237.0000 mL | Freq: Two times a day (BID) | ORAL | Status: DC
  Administered 2014-08-10 – 2014-08-13 (×5): 237 mL via ORAL

## 2014-08-10 MED ORDER — CIPROFLOXACIN IN D5W 400 MG/200ML IV SOLN
400.0000 mg | Freq: Once | INTRAVENOUS | Status: AC
Start: 2014-08-10 — End: 2014-08-10
  Administered 2014-08-10: 400 mg via INTRAVENOUS
  Filled 2014-08-10: qty 200

## 2014-08-10 MED ORDER — LORAZEPAM 0.5 MG PO TABS
0.5000 mg | ORAL_TABLET | Freq: Two times a day (BID) | ORAL | Status: DC | PRN
  Administered 2014-08-10 – 2014-08-11 (×2): 0.5 mg via ORAL
  Filled 2014-08-10 (×2): qty 1

## 2014-08-10 MED ORDER — CINACALCET HCL 30 MG PO TABS
30.0000 mg | ORAL_TABLET | Freq: Every day | ORAL | Status: DC
  Administered 2014-08-10 – 2014-08-14 (×5): 30 mg via ORAL
  Filled 2014-08-10 (×6): qty 1

## 2014-08-10 MED ORDER — MIDODRINE HCL 5 MG PO TABS
10.0000 mg | ORAL_TABLET | Freq: Three times a day (TID) | ORAL | Status: DC
  Administered 2014-08-10 – 2014-08-14 (×13): 10 mg via ORAL
  Filled 2014-08-10 (×16): qty 2

## 2014-08-10 MED ORDER — TRAMADOL HCL 50 MG PO TABS: 50.0000 mg | ORAL_TABLET | Freq: Four times a day (QID) | ORAL | Status: DC | PRN

## 2014-08-10 MED ORDER — ONDANSETRON HCL 4 MG PO TABS: 4.0000 mg | ORAL_TABLET | Freq: Four times a day (QID) | ORAL | Status: DC | PRN

## 2014-08-10 MED ORDER — METRONIDAZOLE IN NACL 5-0.79 MG/ML-% IV SOLN
500.0000 mg | Freq: Three times a day (TID) | INTRAVENOUS | Status: DC
  Administered 2014-08-10 – 2014-08-13 (×9): 500 mg via INTRAVENOUS
  Filled 2014-08-10 (×11): qty 100

## 2014-08-10 MED ORDER — ONDANSETRON HCL 4 MG/2ML IJ SOLN: 4.0000 mg | Freq: Four times a day (QID) | INTRAMUSCULAR | Status: DC | PRN

## 2014-08-10 NOTE — ED Provider Notes (Signed)
CSN: [id number]     Arrival date & time 08/09/14  2110 History   First MD Initiated Contact with Patient 08/09/14 2238     Chief Complaint  Patient presents with  . Diarrhea  . Emesis     (Consider location/radiation/quality/duration/timing/severity/associated sxs/prior Treatment) Patient is a 81 y.o. female presenting with diarrhea and vomiting. The history is provided by the patient and medical records. No language interpreter was used.  Diarrhea Associated symptoms: abdominal pain and vomiting   Associated symptoms: no diaphoresis, no fever and no headaches   Emesis Associated symptoms: abdominal pain and diarrhea   Associated symptoms: no headaches      [name] is a 81 y.o. female  with a hx of ESRD on dialysis (T, Th, Sat), hypertension, hyperlipidemia and dementia presents to the Emergency Department complaining of gradual, persistent, progressively worsening nausea vomiting and diarrhea onset 3 days ago.  Patient reports that ever since she returned home from dialysis on Thursday she has been vomiting. She reports she is unable to keep down any food or water. She reports green watery diarrhea that has a foul odor. She denies melena, hematochezia, hematemesis. She reports that her stomach hurts severely in the middle.  She denies fevers or chills, headache, neck pain, chest pain, shortness of breath, syncope, dysuria. Patient reports she makes a very small amount of urine.  Patient was recently hospitalized approximately 3 weeks ago and reports she was on antibiotics at that time. She denies a history of C. difficile however she is a poor historian.  Patient with associated weakness and reports she was too weak to go to dialysis today.  Past Medical History  Diagnosis Date  . Gout   . CKD (chronic kidney disease) stage V requiring chronic dialysis   . Hypertension   . Hyperlipemia   . Dementia    Past Surgical History  Procedure Laterality Date  . Tubal ligation    .  Av fistula placement     Family History  Problem Relation Age of Onset  . Colon cancer Neg Hx   . Diabetes Mellitus II Neg Hx    History  Substance Use Topics  . Smoking status: Never Smoker   . Smokeless tobacco: Never Used  . Alcohol Use: No   OB History    No data available     Review of Systems  Constitutional: Negative for fever, diaphoresis, appetite change, fatigue and unexpected weight change.  HENT: Negative for mouth sores and trouble swallowing.   Eyes: Negative for visual disturbance.  Respiratory: Negative for cough, chest tightness, shortness of breath, wheezing and stridor.   Cardiovascular: Negative for chest pain and palpitations.  Gastrointestinal: Positive for nausea, vomiting, abdominal pain and diarrhea. Negative for constipation, blood in stool, abdominal distention and rectal pain.  Endocrine: Negative for polydipsia, polyphagia and polyuria.  Genitourinary: Negative for dysuria, urgency, frequency, hematuria, flank pain and difficulty urinating.  Musculoskeletal: Negative for back pain, neck pain and neck stiffness.  Skin: Negative for rash.  Allergic/Immunologic: Negative for immunocompromised state.  Neurological: Negative for syncope, weakness, light-headedness and headaches.  Hematological: Negative for adenopathy. Does not bruise/bleed easily.  Psychiatric/Behavioral: Negative for confusion and sleep disturbance. The patient is not nervous/anxious.   All other systems reviewed and are negative.     Allergies  Nsaids  Home Medications   Prior to Admission medications   Medication Sig Start Date End Date Taking? Authorizing Provider  albuterol (PROAIR HFA) 108 (90 BASE) MCG/ACT inhaler Inhale 1  puff into the lungs every 6 (six) hours as needed for wheezing or shortness of breath.     Historical Provider, MD  amoxicillin-clavulanate (AUGMENTIN) 875-125 MG per tablet Take 1 tablet by mouth 2 (two) times daily. 07/17/14   Clydia Llano, MD   cinacalcet (SENSIPAR) 30 MG tablet Take 1 tablet (30 mg total) by mouth daily with breakfast. 06/09/14   Stephani Police, PA-C  guaifenesin (ROBITUSSIN) 100 MG/5ML syrup Take 10 mLs (200 mg total) by mouth every 4 (four) hours while awake. 07/09/14   [name], MD  HYDROcodone-acetaminophen (NORCO/VICODIN) 5-325 MG per tablet Take 1-2 tablets by mouth every 4 (four) hours as needed for moderate pain or severe pain. 05/10/14   Olivia Mackie, MD  LORazepam (ATIVAN) 0.5 MG tablet Take 1 tablet (0.5 mg total) by mouth every 12 (twelve) hours as needed for anxiety. Also-may give 1 hour prior to dialysis 06/23/14   Rhetta Mura, MD  midodrine (PROAMATINE) 10 MG tablet Take 1 tablet (10 mg total) by mouth 3 (three) times daily with meals. 07/09/14   [name], MD  multivitamin (RENA-VIT) TABS tablet Take 1 tablet by mouth at bedtime.     Historical Provider, MD  nitroGLYCERIN (NITROSTAT) 0.4 MG SL tablet Place 0.4 mg under the tongue every 5 (five) minutes as needed for chest pain.    Historical Provider, MD  Nutritional Supplements (FEEDING SUPPLEMENT, NEPRO CARB STEADY,) LIQD Take 237 mLs by mouth 2 (two) times daily between meals. 06/09/14   Stephani Police, PA-C  traMADol (ULTRAM) 50 MG tablet Take 1 tablet (50 mg total) by mouth every 6 (six) hours as needed for moderate pain. 06/23/14   Rhetta Mura, MD  warfarin (COUMADIN) 4 MG tablet Take 1 tablet (4 mg total) by mouth every evening. 07/11/14   [name], MD   BP 122/70 mmHg  Pulse 75  Temp(Src) 97.9 F (36.6 C) (Oral)  Resp 16  SpO2 100% Physical Exam  Constitutional: She appears well-developed and well-nourished. No distress.  Awake, alert, nontoxic appearance  HENT:  Head: Normocephalic and atraumatic.  Mouth/Throat: Oropharynx is clear and moist. No oropharyngeal exudate.  Eyes: Conjunctivae are normal. No scleral icterus.  Neck: Normal range of motion. Neck supple.  Cardiovascular: Normal  rate, regular rhythm and intact distal pulses.   Murmur ( 3/6 blowing murmur heard best at the left intercostal border) heard. Pulmonary/Chest: Effort normal and breath sounds normal. No respiratory distress. She has no wheezes.  Equal chest expansion  Abdominal: Soft. Bowel sounds are normal. She exhibits no distension and no mass. There is generalized tenderness. There is no rebound, no guarding and no CVA tenderness.  Abdomen is soft and moderately tender throughout without guarding No rebound or peritoneal signs No CVA tenderness  Musculoskeletal: Normal range of motion. She exhibits no edema.  Fistula in the left upper arm with palpable thrill  Neurological: She is alert. She exhibits normal muscle tone. Coordination normal.  Speech is clear and goal oriented Moves extremities without ataxia  Skin: Skin is warm and dry. She is not diaphoretic. No erythema.  Psychiatric: She has a normal mood and affect.  Nursing note and vitals reviewed.   ED Course  Procedures (including critical care time) Labs Review Labs Reviewed  CBC WITH DIFFERENTIAL/PLATELET - Abnormal; Notable for the following:    WBC 16.4 (*)    RBC 3.70 (*)    MCV 101.4 (*)    RDW 16.7 (*)    Neutrophils Relative %  81 (*)    Neutro Abs 13.2 (*)    All other components within normal limits  COMPREHENSIVE METABOLIC PANEL - Abnormal; Notable for the following:    Glucose, Bld 101 (*)    BUN 39 (*)    Creatinine, Ser 6.47 (*)    Albumin 3.2 (*)    GFR calc non Af Amer 5 (*)    GFR calc Af Amer 6 (*)    Anion gap 18 (*)    All other components within normal limits  LIPASE, BLOOD  GI PATHOGEN PANEL BY PCR, STOOL    Imaging Review No results found.   EKG Interpretation None      MDM   Final diagnoses:  Abdominal pain   Roshini M Mazzie presents with abdominal pain, nausea and vomiting for several days.  Concern for C. Diff.  Labs and imaging pending.  Abd does not appear surgical at this time.     1:27 AM Pt's pain controlled.  Stool culture pending.  Pt with leukocytosis.  Elevated creatinine and BUN.  Pt missed dialysis today, but also appears dry.  Will give fluid bolus of 500cc.   Care transferred to Dr. Ranae PalmsYelverton for anticipated admission after CT scan.    BP 124/64 mmHg  Pulse 73  Temp(Src) 97.9 F (36.6 C) (Oral)  Resp 16  SpO2 98%   [name] ForthHannah Kaelie Henigan, PA-C 08/10/14 0128  Loren Raceravid Yelverton, MD 08/10/14 [phone]

## 2014-08-10 NOTE — Consult Note (Signed)
[name] Renal Consultation Note  Indication for Consultation:  Management of ESRD/hemodialysis; anemia, hypertension/volume and secondary hyperparathyroidism  HPI: [name] is a 81 y.o. female presented to the ER after [name]( main care giver) reported increasing N/ V and  diarrhea ,since Thursday evening with her  CT scan shows ascending colitis. She attended her normal op HD ([facility]) Thursday had some reported cramping at end of tx /with BP pre 103/57  Post 106/56 . She had no GI symptoms before HD  Per [name]. Noted from op records she has not been ablt to obtain  her EDW post tx last 2 weeks with her normal low bp on Midodrine  5 mg pre hd for bp support.She missed Tx yesterday sec to her Diarrhea and abdominal pain. Denies sob, chest pain, dizziness, chills , fevers. She has been using her HD perm cath  Instead of AVF sec to large hematoma / infiltration of avf with OV fu VVS planned Feb 12 to eval use again  Per [name].      Past Medical History  Diagnosis Date  . Gout   . CKD (chronic kidney disease) stage V requiring chronic dialysis   . Hypertension   . Hyperlipemia   . Dementia     Past Surgical History  Procedure Laterality Date  . Tubal ligation    . Av fistula placement        Family History  Problem Relation Age of Onset  . Colon cancer Neg Hx   . Diabetes Mellitus II Neg Hx       reports that she has never smoked. She has never used smokeless tobacco. She reports that she does not drink alcohol or use illicit drugs.   Allergies  Allergen Reactions  . Nsaids Other (See Comments)    REACTION: Avoid NSAIDS(renal failure)    Prior to Admission medications   Medication Sig Start Date End Date Taking? Authorizing Provider  albuterol (PROAIR HFA) 108 (90 BASE) MCG/ACT inhaler Inhale 1 puff into the lungs every 6 (six) hours as needed for wheezing or shortness of breath.     Historical Provider, MD  amoxicillin-clavulanate (AUGMENTIN)  875-125 MG per tablet Take 1 tablet by mouth 2 (two) times daily. 07/17/14   [name], MD  cinacalcet (SENSIPAR) 30 MG tablet Take 1 tablet (30 mg total) by mouth daily with breakfast. 06/09/14   [name], PA-C  guaifenesin (ROBITUSSIN) 100 MG/5ML syrup Take 10 mLs (200 mg total) by mouth every 4 (four) hours while awake. 07/09/14   [name], MD  HYDROcodone-acetaminophen (NORCO/VICODIN) 5-325 MG per tablet Take 1-2 tablets by mouth every 4 (four) hours as needed for moderate pain or severe pain. 05/10/14   [name], MD  LORazepam (ATIVAN) 0.5 MG tablet Take 1 tablet (0.5 mg total) by mouth every 12 (twelve) hours as needed for anxiety. Also-may give 1 hour prior to dialysis 06/23/14   [name], MD  midodrine (PROAMATINE) 10 MG tablet Take 1 tablet (10 mg total) by mouth 3 (three) times daily with meals. 07/09/14   [name], MD  multivitamin (RENA-VIT) TABS tablet Take 1 tablet by mouth at bedtime.     Historical Provider, MD  nitroGLYCERIN (NITROSTAT) 0.4 MG SL tablet Place 0.4 mg under the tongue every 5 (five) minutes as needed for chest pain.    Historical Provider, MD  Nutritional Supplements (FEEDING SUPPLEMENT, NEPRO CARB STEADY,) LIQD Take 237 mLs by mouth 2 (two) times daily between  meals. 06/09/14   [name], PA-C  traMADol (ULTRAM) 50 MG tablet Take 1 tablet (50 mg total) by mouth every 6 (six) hours as needed for moderate pain. 06/23/14   [name], MD  warfarin (COUMADIN) 4 MG tablet Take 1 tablet (4 mg total) by mouth every evening. 07/11/14   [name], MD     Scheduled: . cinacalcet  30 mg Oral Q breakfast  . [START ON 08/11/2014] ciprofloxacin  400 mg Intravenous Q24H  . feeding supplement (NEPRO CARB STEADY)  237 mL Oral BID BM  . guaifenesin  200 mg Oral Q4H while awake  . metronidazole  500 mg Intravenous Q8H  . midodrine  10 mg Oral TID WC  . sodium chloride  3 mL Intravenous Q12H  . warfarin  5 mg Oral  ONCE-1800    Results for orders placed or performed during the hospital encounter of 08/09/14 (from the past 48 hour(s))  CBC WITH DIFFERENTIAL     Status: Abnormal   Collection Time: 08/09/14  9:38 PM  Result Value Ref Range   WBC 16.4 (H) 4.0 - 10.5 K/uL   RBC 3.70 (L) 3.87 - 5.11 MIL/uL   Hemoglobin 12.0 12.0 - 15.0 g/dL   HCT 37.5 36.0 - 46.0 %   MCV 101.4 (H) 78.0 - 100.0 fL   MCH 32.4 26.0 - 34.0 pg   MCHC 32.0 30.0 - 36.0 g/dL   RDW 16.7 (H) 11.5 - 15.5 %   Platelets 270 150 - 400 K/uL   Neutrophils Relative % 81 (H) 43 - 77 %   Neutro Abs 13.2 (H) 1.7 - 7.7 K/uL   Lymphocytes Relative 16 12 - 46 %   Lymphs Abs 2.7 0.7 - 4.0 K/uL   Monocytes Relative 3 3 - 12 %   Monocytes Absolute 0.5 0.1 - 1.0 K/uL   Eosinophils Relative 0 0 - 5 %   Eosinophils Absolute 0.0 0.0 - 0.7 K/uL   Basophils Relative 0 0 - 1 %   Basophils Absolute 0.0 0.0 - 0.1 K/uL  Comprehensive metabolic panel     Status: Abnormal   Collection Time: 08/09/14  9:38 PM  Result Value Ref Range   Sodium 140 135 - 145 mmol/L   Potassium 4.0 3.5 - 5.1 mmol/L   Chloride 101 96 - 112 mmol/L   CO2 21 19 - 32 mmol/L   Glucose, Bld 101 (H) 70 - 99 mg/dL   BUN 39 (H) 6 - 23 mg/dL   Creatinine, Ser 6.47 (H) 0.50 - 1.10 mg/dL   Calcium 9.8 8.4 - 10.5 mg/dL   Total Protein 8.3 6.0 - 8.3 g/dL   Albumin 3.2 (L) 3.5 - 5.2 g/dL   AST 20 0 - 37 U/L   ALT 15 0 - 35 U/L   Alkaline Phosphatase 61 39 - 117 U/L   Total Bilirubin 0.9 0.3 - 1.2 mg/dL   GFR calc non Af Amer 5 (L) >90 mL/min   GFR calc Af Amer 6 (L) >90 mL/min    Comment: (NOTE) The eGFR has been calculated using the CKD EPI equation. This calculation has not been validated in all clinical situations. eGFR's persistently <90 mL/min signify possible Chronic Kidney Disease.    Anion gap 18 (H) 5 - 15  Lipase, blood     Status: None   Collection Time: 08/09/14 10:42 PM  Result Value Ref Range   Lipase 29 11 - 59 U/L  I-Stat CG4 Lactic Acid, ED  Status:  None   Collection Time: 08/10/14  3:38 AM  Result Value Ref Range   Lactic Acid, Venous 0.78 0.5 - 2.0 mmol/L  Protime-INR     Status: Abnormal   Collection Time: 08/10/14  8:25 AM  Result Value Ref Range   Prothrombin Time 19.7 (H) 11.6 - 15.2 seconds   INR 1.66 (H) 0.00 - 1.49  CBC WITH DIFFERENTIAL     Status: Abnormal   Collection Time: 08/10/14  8:25 AM  Result Value Ref Range   WBC 11.3 (H) 4.0 - 10.5 K/uL   RBC 3.14 (L) 3.87 - 5.11 MIL/uL   Hemoglobin 9.8 (L) 12.0 - 15.0 g/dL    Comment: DELTA CHECK NOTED   HCT 30.9 (L) 36.0 - 46.0 %   MCV 98.4 78.0 - 100.0 fL   MCH 31.2 26.0 - 34.0 pg   MCHC 31.7 30.0 - 36.0 g/dL   RDW 16.6 (H) 11.5 - 15.5 %   Platelets 207 150 - 400 K/uL    Comment: DELTA CHECK NOTED   Neutrophils Relative % 71 43 - 77 %   Neutro Abs 8.1 (H) 1.7 - 7.7 K/uL   Lymphocytes Relative 22 12 - 46 %   Lymphs Abs 2.5 0.7 - 4.0 K/uL   Monocytes Relative 5 3 - 12 %   Monocytes Absolute 0.6 0.1 - 1.0 K/uL   Eosinophils Relative 1 0 - 5 %   Eosinophils Absolute 0.1 0.0 - 0.7 K/uL   Basophils Relative 0 0 - 1 %   Basophils Absolute 0.0 0.0 - 0.1 K/uL  Comprehensive metabolic panel     Status: Abnormal   Collection Time: 08/10/14  8:25 AM  Result Value Ref Range   Sodium 136 135 - 145 mmol/L   Potassium 3.4 (L) 3.5 - 5.1 mmol/L   Chloride 102 96 - 112 mmol/L   CO2 19 19 - 32 mmol/L   Glucose, Bld 70 70 - 99 mg/dL   BUN 42 (H) 6 - 23 mg/dL   Creatinine, Ser 6.33 (H) 0.50 - 1.10 mg/dL   Calcium 8.5 8.4 - 10.5 mg/dL   Total Protein 6.2 6.0 - 8.3 g/dL   Albumin 2.6 (L) 3.5 - 5.2 g/dL   AST 18 0 - 37 U/L   ALT 12 0 - 35 U/L   Alkaline Phosphatase 51 39 - 117 U/L   Total Bilirubin 0.8 0.3 - 1.2 mg/dL   GFR calc non Af Amer 6 (L) >90 mL/min   GFR calc Af Amer 6 (L) >90 mL/min    Comment: (NOTE) The eGFR has been calculated using the CKD EPI equation. This calculation has not been validated in all clinical situations. eGFR's persistently <90 mL/min signify  possible Chronic Kidney Disease.    Anion gap 15 5 - 15  Type and screen     Status: None   Collection Time: 08/10/14  8:25 AM  Result Value Ref Range   ABO/RH(D) O POS    Antibody Screen NEG    Sample Expiration 08/13/2014    ROS: see hpi for positves  Physical Exam: Filed Vitals:   08/10/14 0508  BP: 99/57  Pulse: 80  Temp: 98 F (36.7 C)  Resp: 14     General: Alert , talkative Thin elderly BF, NAD, OX3  HEENT: [place], DRY mm Eyes: Non icteric  Neck: Supple, no jvd Heart: RRR, soft 1/6 sem lsb , no rub or gallop Lungs: CTA bilat Abdomen: BS  Distant, soft , Tenderness >  R L Quad Extremities:  No pedal edema Skin:  Dry no overt rash or pedal ulcers  Neuro: Moves all extrem. no overt deficits  Dialysis Access: Pos bruit L UA AVF minimal Swelling non tender/  Merwyn Katos cath  Dialysis Orders: Center: [facility]  on TTS . EDW 53 ( Last 2 tx s left at 57.6, 57.3  HD Bath 4k, 2.25ca  Time 4hr Heparin 5700. Access LUA AVF, R IJ Perm cath   Mircera 150 mcg  q 2 weeks  Given 07/31/14       Other op labs HGB 9.6 08-07-14,  30% tfs 2765 ferritin    Ca 8.8 phos 5.3  Pth 292   Assessment/Plan 1. N/V / diarrhea / asc colitis on CT/ Ileus  ( may be Ischemic in nature  occuring after Hd Tx  With HO low bp)  Per admit on Cipro/ flagyl Cdif PCR pending, on empiric IV abx for colitis on CT, clear liquids started.   2. ESRD - ( OP TTS  [facility]) missed hd yesterday / K and vol okay today  Plan hd in am  Keep even 3. Hypertension/volume  - BP 99/57 on Midodrine 27m pre hd per op records  ^ 195mtid/ no uf in am hd/ 2 kg up by BED WT 4. Anemia  - On Micera as op  Next dose due 08/14/14  hgb  11.3 monitor in hosp , no fe 5. Metabolic bone disease -   No op binder or vit d , on sensipar  Fu ca/phos pre hd 6. Nutrition - npo now 7. Dementia- about baseline now/ lives with [name]  DaErnest HaberPA-C [id number]/31/2016, 11:21 AM   Pt seen, examined, agree w assess/plan as  above with additions as indicated. ESRD patient with  RoKelly SplinterD pager [phone]  cell [phone]/31/2016, 6:05 PM

## 2014-08-10 NOTE — Progress Notes (Signed)
New Admission Note:   Arrival Method: via stretcher Mental Orientation: alert oriented x 6 Telemetry: Assessment: Completed Skin: Intact IV: Saline Lock Pain: Denies Tubes: None Safety Measures: Safety Fall Prevention Plan has been given, discussed and signed Admission:  [address] [place] Orientation: Patient has been orientated to the room, unit and staff.  Family: Son at bedside  Orders have been reviewed and implemented. Will continue to monitor the patient. Call light has been placed within reach and bed alarm has been activated.   Burley SaverKami Sidney Kann, RN-BC Phone: [phone]

## 2014-08-10 NOTE — Progress Notes (Signed)
I have seen and assessed the patient and agree with Dr. Eliane DecreePatel's assessment and plan. Patient presented with nausea vomiting and diarrhea. CT of the abdomen and pelvis concerning for colitis. C. difficile PCR pending. Continue empiric IV Cipro and Flagyl. Supportive care. We'll place on clear liquids. Follow. If no significant improvement will need to consult with GI for further evaluation and management.

## 2014-08-10 NOTE — Progress Notes (Signed)
ANTICOAGULATION CONSULT NOTE - Initial Consult  Pharmacy Consult for Warfarin Indication: Hx DVT  Allergies  Allergen Reactions  . Nsaids Other (See Comments)    REACTION: Avoid NSAIDS(renal failure)    Patient Measurements: Weight: 121 lb 4.1 oz (55 kg)  Vital Signs: Temp: 98 F (36.7 C) (01/31 0508) Temp Source: Oral (01/31 0508) BP: 99/57 mmHg (01/31 0508) Pulse Rate: 80 (01/31 0508)  Labs:  Recent Labs  08/09/14 2138 08/10/14 0825  HGB 12.0 9.8*  HCT 37.5 30.9*  PLT 270 207  LABPROT  --  19.7*  INR  --  1.66*  CREATININE 6.47*  --     Estimated Creatinine Clearance: 5.6 mL/min (by C-G formula based on Cr of 6.47).   Medical History: Past Medical History  Diagnosis Date  . Gout   . CKD (chronic kidney disease) stage V requiring chronic dialysis   . Hypertension   . Hyperlipemia   . Dementia     Medications:  Scheduled:  . cinacalcet  30 mg Oral Q breakfast  . [START ON 08/11/2014] ciprofloxacin  400 mg Intravenous Q24H  . feeding supplement (NEPRO CARB STEADY)  237 mL Oral BID BM  . guaifenesin  200 mg Oral Q4H while awake  . metronidazole  500 mg Intravenous Q8H  . midodrine  10 mg Oral TID WC  . sodium chloride  3 mL Intravenous Q12H   Infusions:    Assessment:  [age] YOF w/ [facility] c/o NVD x 3 days.  Patient recently admitted in the hospital for HCAP treated with vancomycin and cefepime and discharged with augmentin 07/17/14.  Patient has a history of DVTs.  Pharmacy has been consulted to dose warfarin during this patient's hospital admission.  PMH: ESRD ?on hemodialysis TTS, hypertension, dyslipidemia, GERD, gout  Anticoagulation/Heme: Hx DVT - INR admit 1.66.  Hgb trend down 12.0 >> 9.8, plt wnl.  PTA warfarin 4 mg PO daily.  Goal INR: 2-3.  No bleeding reported at this time.    Of note, patient is on ciprofloxacin and metronidazole, resulting in a potential DDI increasing the patient's INR.  Therefore, I will not be too aggressive  with increasing the patient's dose to get to therapeutic INR.    Plan: - Warfarin 5 mg x 1 tonight - Daily INR - Monitor for signs and symptoms of bleeding  Red ChristiansSamson Fredia Chittenden, Pharm. D. Clinical Pharmacy Resident Pager: [phone] Ph: [phone] 08/10/2014 10:25 AM

## 2014-08-10 NOTE — H&P (Signed)
[facility] History and Physical  Patient: [name]  MRN: [id number]  DOB: 1933-05-23  DOS: the patient was seen and examined on 08/10/2014 PCP: Dorrene German, MD  Chief Complaint: diarrhea  HPI: [name] is a 81 y.o. female with Past medical history of ESRD on hemodialysis, hypertension, dyslipidemia, GERD, gout. The patient presents with complaints of nausea vomiting and diarrhea. Patient is unable to tell me the duration but in the ER she told that this has been ongoing since last 3 days. Patient went for her routine dialysis on Thursday and after Thursday she started having diarrhea. Patient denies any blood or black color bowel movement. Patient also complains of nausea and vomiting. Patient compresses of some fever. Compressive fatigue and tiredness. No chest pain or shortness of breath. Patient was recently admitted in the hospital for pneumonia and was treated with antibiotics.  The patient is coming from home And at her baseline independent for most of her ADL.  Review of Systems: as mentioned in the history of present illness.  A Comprehensive review of the other systems is negative.  Past Medical History  Diagnosis Date  . Gout   . CKD (chronic kidney disease) stage V requiring chronic dialysis   . Hypertension   . Hyperlipemia   . Dementia    Past Surgical History  Procedure Laterality Date  . Tubal ligation    . Av fistula placement     Social History:  reports that she has never smoked. She has never used smokeless tobacco. She reports that she does not drink alcohol or use illicit drugs.  Allergies  Allergen Reactions  . Nsaids Other (See Comments)    REACTION: Avoid NSAIDS(renal failure)    Family History  Problem Relation Age of Onset  . Colon cancer Neg Hx   . Diabetes Mellitus II Neg Hx     Prior to Admission medications   Medication Sig Start Date End Date Taking? Authorizing Provider  albuterol (PROAIR HFA) 108 (90 BASE)  MCG/ACT inhaler Inhale 1 puff into the lungs every 6 (six) hours as needed for wheezing or shortness of breath.     Historical Provider, MD  amoxicillin-clavulanate (AUGMENTIN) 875-125 MG per tablet Take 1 tablet by mouth 2 (two) times daily. 07/17/14   Clydia Llano, MD  cinacalcet (SENSIPAR) 30 MG tablet Take 1 tablet (30 mg total) by mouth daily with breakfast. 06/09/14   Stephani Police, PA-C  guaifenesin (ROBITUSSIN) 100 MG/5ML syrup Take 10 mLs (200 mg total) by mouth every 4 (four) hours while awake. 07/09/14   [name], MD  HYDROcodone-acetaminophen (NORCO/VICODIN) 5-325 MG per tablet Take 1-2 tablets by mouth every 4 (four) hours as needed for moderate pain or severe pain. 05/10/14   Olivia Mackie, MD  LORazepam (ATIVAN) 0.5 MG tablet Take 1 tablet (0.5 mg total) by mouth every 12 (twelve) hours as needed for anxiety. Also-may give 1 hour prior to dialysis 06/23/14   Rhetta Mura, MD  midodrine (PROAMATINE) 10 MG tablet Take 1 tablet (10 mg total) by mouth 3 (three) times daily with meals. 07/09/14   [name], MD  multivitamin (RENA-VIT) TABS tablet Take 1 tablet by mouth at bedtime.     Historical Provider, MD  nitroGLYCERIN (NITROSTAT) 0.4 MG SL tablet Place 0.4 mg under the tongue every 5 (five) minutes as needed for chest pain.    Historical Provider, MD  Nutritional Supplements (FEEDING SUPPLEMENT, NEPRO CARB STEADY,) LIQD Take 237 mLs by mouth 2 (  two) times daily between meals. 06/09/14   Stephani PoliceMarianne L York, PA-C  traMADol (ULTRAM) 50 MG tablet Take 1 tablet (50 mg total) by mouth every 6 (six) hours as needed for moderate pain. 06/23/14   Rhetta MuraJai-Gurmukh Samtani, MD  warfarin (COUMADIN) 4 MG tablet Take 1 tablet (4 mg total) by mouth every evening. 07/11/14   Marinda ElkAbraham Feliz Ortiz, MD    Physical Exam: Filed Vitals:   08/10/14 0400 08/10/14 0414 08/10/14 0452 08/10/14 0508  BP: 121/64   99/57  Pulse: 81   80  Temp:  98 F (36.7 C)  98 F (36.7 C)  TempSrc:  Oral   Oral  Resp:    14  Weight:   55 kg (121 lb 4.1 oz)   SpO2: 98%   100%    General: Alert, Awake and Oriented to Time, Place and Person. Appear in mild distress Eyes: PERRL ENT: Oral Mucosa clear moist. Neck: no JVD Cardiovascular: S1 and S2 Present, aortic systolic Murmur, Peripheral Pulses Present Respiratory: Bilateral Air entry equal and Decreased, Clear to Auscultation, noCrackles, no wheezes Abdomen: Bowel Sound present, Soft and right-sided tender Skin: no Rash Extremities: no Pedal edema, no calf tenderness Neurologic: Grossly no focal neuro deficit.  Labs on Admission:  CBC:  Recent Labs Lab 08/09/14 2138  WBC 16.4*  NEUTROABS 13.2*  HGB 12.0  HCT 37.5  MCV 101.4*  PLT 270    CMP     Component Value Date/Time   NA 140 08/09/2014 2138   K 4.0 08/09/2014 2138   CL 101 08/09/2014 2138   CO2 21 08/09/2014 2138   GLUCOSE 101* 08/09/2014 2138   BUN 39* 08/09/2014 2138   CREATININE 6.47* 08/09/2014 2138   CALCIUM 9.8 08/09/2014 2138   CALCIUM 9.6 04/10/2014 0917   PROT 8.3 08/09/2014 2138   ALBUMIN 3.2* 08/09/2014 2138   AST 20 08/09/2014 2138   ALT 15 08/09/2014 2138   ALKPHOS 61 08/09/2014 2138   BILITOT 0.9 08/09/2014 2138   GFRNONAA 5* 08/09/2014 2138   GFRAA 6* 08/09/2014 2138     Recent Labs Lab 08/09/14 2242  LIPASE 29    No results for input(s): CKTOTAL, CKMB, CKMBINDEX, TROPONINI in the last 168 hours. BNP (last 3 results) No results for input(s): PROBNP in the last 8760 hours.  Radiological Exams on Admission: Ct Abdomen Pelvis Wo Contrast  08/10/2014   CLINICAL DATA:  Nausea, vomiting, diarrhea for 3 days. Lower abdominal pain.  EXAM: CT ABDOMEN AND PELVIS WITHOUT CONTRAST  TECHNIQUE: Multidetector CT imaging of the abdomen and pelvis was performed following the standard protocol without IV contrast.  COMPARISON:  None.  FINDINGS: There is stable cardiomegaly.  There is left basilar fibrosis.  No renal, ureteral, or bladder calculi. No  obstructive uropathy. No perinephric stranding is seen. There is a multi-cystic left kidney with the largest measuring 3.5 x 5.2 cm. The right kidney is atrophic. There is a 2.2 x 2.6 cm hypodense, fluid attenuating renal mass most consistent with a cyst. There is a punctate cortical calcification in the right kidney. The bladder is unremarkable.  The liver demonstrates no focal abnormality. There is cholelithiasis. The spleen demonstrates no focal abnormality. The adrenal glands and pancreas are normal.  There is cecal and ascending colon bowel wall thickening and surrounding inflammatory changes most severe at the cecum. There is mild small bowel dilatation with a few air-fluid levels. There are air-fluid levels throughout the colon consistent with diarrhea. There is diverticulosis without evidence of diverticulitis.  There is no pneumoperitoneum, pneumatosis, or portal venous gas. There is a small amount of pelvic free fluid. There is no lymphadenopathy.  The abdominal aorta is normal in caliber with atherosclerosis.  There are mild degenerative changes of bilateral SI joints. There is lumbar spine spondylosis.  IMPRESSION: 1. Ascending colitis which may be secondary to an infectious versus inflammatory versus ischemic etiology. There is mild small bowel dilatation with multiple air-fluid levels likely reflecting an ileus. 2. Cholelithiasis. 3. Multicystic left kidney.  Right renal cyst.   Electronically Signed   By: Elige Ko   On: 08/10/2014 01:57    Assessment/Plan Principal Problem:   Colitis Active Problems:   GERD   ESRD on dialysis   Dementia with behavioral disturbance   Arterial hypotension   DVT (deep venous thrombosis)   Anemia of renal disease   1. Colitis The patient is presenting with numbness of nausea vomiting and diarrhea. She was mildly hypotensive initially. She continues to remain dehydrated clinically. She also has evidence of hemoconcentration with elevated WBC as well as  a hemoglobin. But her lactic acid level is normal. CT scan shows ascending colitis with broad differential. But with lactic acid normal possibility of events ischemic colitis is less likely. At present waiting for C. difficile.. Patient is on Cipro Flagyl. Patient will remain nothing by mouth as a CT scan also has evidence of ileus. Patient does not have any nausea or vomiting at this point. Continue close monitoring.  2. ESRD on hemodialysis. Patient has completed her hemodialysis treatment. Continue TTS.  3. Dementia. Does not appear to be having any behavioral disturbances at present. Continue close monitoring.  4. History of DVT. Checking INR. Warfarin per pharmacy. Patient denies any acute bleeding. Checking FOBT.  Advance goals of care discussion: DNR/DNI   DVT Prophylaxis: on chronic anticoagulation Nutrition: Nothing by mouth except medication  Disposition: Admitted to inpatient in telemetry unit.  Author: Lynden Oxford, MD [facility] Pager: [phone] 08/10/2014, 5:38 AM    If 7PM-7AM, please contact night-coverage [email or web address] Password [email or web address]

## 2014-08-11 LAB — COMPREHENSIVE METABOLIC PANEL
ALK PHOS: 47 U/L (ref 39–117)
ALT: 11 U/L (ref 0–35)
AST: 13 U/L (ref 0–37)
Albumin: 2.6 g/dL — ABNORMAL LOW (ref 3.5–5.2)
Anion gap: 14 (ref 5–15)
BILIRUBIN TOTAL: 0.6 mg/dL (ref 0.3–1.2)
BUN: 48 mg/dL — ABNORMAL HIGH (ref 6–23)
CO2: 19 mmol/L (ref 19–32)
CREATININE: 7.02 mg/dL — AB (ref 0.50–1.10)
Calcium: 7.5 mg/dL — ABNORMAL LOW (ref 8.4–10.5)
Chloride: 102 mmol/L (ref 96–112)
GFR, EST AFRICAN AMERICAN: 6 mL/min — AB (ref >90)
GFR, EST NON AFRICAN AMERICAN: 5 mL/min — AB (ref >90)
Glucose, Bld: 96 mg/dL (ref 70–99)
POTASSIUM: 3 mmol/L — AB (ref 3.5–5.1)
Sodium: 135 mmol/L (ref 135–145)
Total Protein: 6.3 g/dL (ref 6.0–8.3)

## 2014-08-11 LAB — CBC
HEMATOCRIT: 29.1 % — AB (ref 36.0–46.0)
HEMOGLOBIN: 9.4 g/dL — AB (ref 12.0–15.0)
MCH: 30.9 pg (ref 26.0–34.0)
MCHC: 32.3 g/dL (ref 30.0–36.0)
MCV: 95.7 fL (ref 78.0–100.0)
Platelets: 250 10*3/uL (ref 150–400)
RBC: 3.04 MIL/uL — ABNORMAL LOW (ref 3.87–5.11)
RDW: 16.5 % — ABNORMAL HIGH (ref 11.5–15.5)
WBC: 9.2 10*3/uL (ref 4.0–10.5)

## 2014-08-11 LAB — PROTIME-INR
INR: 2.2 — AB (ref 0.00–1.49)
Prothrombin Time: 24.6 seconds — ABNORMAL HIGH (ref 11.6–15.2)

## 2014-08-11 MED ORDER — WARFARIN - PHARMACIST DOSING INPATIENT: Freq: Every day | Status: DC

## 2014-08-11 MED ORDER — WARFARIN SODIUM 1 MG PO TABS
1.0000 mg | ORAL_TABLET | Freq: Once | ORAL | Status: AC
  Administered 2014-08-11: 1 mg via ORAL
  Filled 2014-08-11: qty 1

## 2014-08-11 MED ORDER — BOOST / RESOURCE BREEZE PO LIQD
1.0000 | Freq: Two times a day (BID) | ORAL | Status: DC
  Administered 2014-08-11 – 2014-08-14 (×6): 1 via ORAL

## 2014-08-11 MED ORDER — HEPARIN SODIUM (PORCINE) 5000 UNIT/ML IJ SOLN
5000.0000 [IU] | Freq: Three times a day (TID) | INTRAMUSCULAR | Status: DC
  Filled 2014-08-11 (×3): qty 1

## 2014-08-11 MED ORDER — WARFARIN SODIUM 1 MG PO TABS
1.0000 mg | ORAL_TABLET | Freq: Once | ORAL | Status: DC
  Filled 2014-08-11: qty 1

## 2014-08-11 MED ORDER — POTASSIUM CHLORIDE CRYS ER 20 MEQ PO TBCR
40.0000 meq | EXTENDED_RELEASE_TABLET | Freq: Once | ORAL | Status: DC
Start: 2014-08-11 — End: 2014-08-11

## 2014-08-11 NOTE — Progress Notes (Signed)
ANTICOAGULATION CONSULT NOTE - Follow Up Consult  Pharmacy Consult:  Coumadin Indication:  History of DVT  Allergies  Allergen Reactions  . Nsaids Other (See Comments)    REACTION: Avoid NSAIDS(renal failure)    Patient Measurements: Height: 5\' 2"  (157.5 cm) Weight: 120 lb 1.6 oz (54.477 kg) IBW/kg (Calculated) : 50.1  Vital Signs: Temp: 97.5 F (36.4 C) (02/01 0819) Temp Source: Oral (02/01 0819) BP: 99/64 mmHg (02/01 0819) Pulse Rate: 86 (02/01 0819)  Labs:  Recent Labs  08/09/14 2138 08/10/14 0825  HGB 12.0 9.8*  HCT 37.5 30.9*  PLT 270 207  LABPROT  --  19.7*  INR  --  1.66*  CREATININE 6.47* 6.33*    Estimated Creatinine Clearance: 5.7 mL/min (by C-G formula based on Cr of 6.33).    Assessment: 89 YOF continues on Coumadin from PTA for history of DVT.  INR increased significantly to therapeutic level today, likely from drug-drug interactions with Cipro and Flagyl.  No bleeding reported.   Goal of Therapy:  INR 2-3    Plan:  - Coumadin 1mg  PO today - Daily PT / INR - F/U KCL supplementation   Anniya Whiters D. Laney Potashang, PharmD, BCPS Pager:  [phone] 08/11/2014, 2:14 PM

## 2014-08-11 NOTE — Procedures (Signed)
Using HD catheter. Hemodynamically stable. Has access in LUE but recent infiltrate and currently not using [facility].  Cont current tx and will get back on schedule on Tues.  Will shorten treatment today. [name] C

## 2014-08-11 NOTE — Consult Note (Signed)
[facility] Consult Note  Referring Provider: No ref. provider found Primary Care Physician:  Philis Fendt, MD Primary Gastroenterologist:  Dr.  Laurel Dimmer Complaint: Abdominal pain and diarrhea HPI: [name] is an 81 y.o. black female  with end-stage renal disease who presents with nausea vomiting diarrhea apparently nonbloody with some abdominal pain. CT scan of her abdomen showed significant colitis mainly in the cecum and ascending colon. There is no history of recent antibiotics. C. difficile toxin is pending. It is unclear whether she's had any recent antibiotics.. She states that her abdominal pain is a little better today and is tolerating a clear liquid diet without vomiting  Past Medical History  Diagnosis Date  . Gout   . CKD (chronic kidney disease) stage V requiring chronic dialysis   . Hypertension   . Hyperlipemia   . Dementia     Past Surgical History  Procedure Laterality Date  . Tubal ligation    . Av fistula placement      Medications Prior to Admission  Medication Sig Dispense Refill  . albuterol (PROAIR HFA) 108 (90 BASE) MCG/ACT inhaler Inhale 2 puffs into the lungs every 6 (six) hours as needed for wheezing or shortness of breath.     Marland Kitchen albuterol (PROVENTIL) (2.5 MG/3ML) 0.083% nebulizer solution Take 3 mLs by nebulization daily as needed.  11  . cinacalcet (SENSIPAR) 30 MG tablet Take 1 tablet (30 mg total) by mouth daily with breakfast. 30 tablet 3  . FLOVENT DISKUS 100 MCG/BLIST AEPB Inhale 2 puffs into the lungs daily as needed.  3  . HYDROcodone-acetaminophen (NORCO/VICODIN) 5-325 MG per tablet Take 1-2 tablets by mouth every 4 (four) hours as needed for moderate pain or severe pain. (Patient taking differently: Take 1 tablet by mouth every 4 (four) hours as needed for moderate pain or severe pain. ) 30 tablet 0  . LORazepam (ATIVAN) 0.5 MG tablet Take 1 tablet (0.5 mg total) by mouth every 12 (twelve) hours as needed for anxiety. Also-may  give 1 hour prior to dialysis 30 tablet 0  . midodrine (PROAMATINE) 10 MG tablet Take 1 tablet (10 mg total) by mouth 3 (three) times daily with meals. 90 tablet 3  . nitroGLYCERIN (NITROSTAT) 0.4 MG SL tablet Place 0.4 mg under the tongue every 5 (five) minutes as needed for chest pain.    . Nutritional Supplements (FEEDING SUPPLEMENT, NEPRO CARB STEADY,) LIQD Take 237 mLs by mouth 2 (two) times daily between meals. (Patient taking differently: Take 237 mLs by mouth daily as needed (feeding supplement). ) 60 Can 3  . warfarin (COUMADIN) 4 MG tablet Take 1 tablet (4 mg total) by mouth every evening. 30 tablet 0  . amoxicillin-clavulanate (AUGMENTIN) 875-125 MG per tablet Take 1 tablet by mouth 2 (two) times daily. (Patient not taking: Reported on 08/10/2014) 10 tablet 0  . guaifenesin (ROBITUSSIN) 100 MG/5ML syrup Take 10 mLs (200 mg total) by mouth every 4 (four) hours while awake. (Patient not taking: Reported on 08/10/2014) 120 mL 0  . multivitamin (RENA-VIT) TABS tablet Take 1 tablet by mouth at bedtime.     . traMADol (ULTRAM) 50 MG tablet Take 1 tablet (50 mg total) by mouth every 6 (six) hours as needed for moderate pain. (Patient not taking: Reported on 08/10/2014) 30 tablet 0    Allergies:  Allergies  Allergen Reactions  . Nsaids Other (See Comments)    REACTION: Avoid NSAIDS(renal failure)    Family History  Problem Relation Age of Onset  .  Colon cancer Neg Hx   . Diabetes Mellitus II Neg Hx     Social History:  reports that she has never smoked. She has never used smokeless tobacco. She reports that she does not drink alcohol or use illicit drugs.  Review of Systems: negative except as above  Blood pressure 99/64, pulse 86, temperature 97.5 F (36.4 C), temperature source Oral, resp. rate 14, height 5' 2"  (1.575 m), weight 54.477 kg (120 lb 1.6 oz), SpO2 100 %. Head: Normocephalic, without obvious abnormality, atraumatic Neck: no adenopathy, no carotid bruit, no JVD, supple,  symmetrical, trachea midline and thyroid not enlarged, symmetric, no tenderness/mass/nodules Resp: clear to auscultation bilaterally Cardio: regular rate and rhythm, S1, S2 normal, no murmur, click, rub or gallop GI: Abdomen soft with mild diffuse tenderness no guarding or rebound  Extremities: extremities normal, atraumatic, no cyanosis or edema  Results for orders placed or performed during the hospital encounter of 08/09/14 (from the past 48 hour(s))  CBC WITH DIFFERENTIAL     Status: Abnormal   Collection Time: 08/09/14  9:38 PM  Result Value Ref Range   WBC 16.4 (H) 4.0 - 10.5 K/uL   RBC 3.70 (L) 3.87 - 5.11 MIL/uL   Hemoglobin 12.0 12.0 - 15.0 g/dL   HCT 37.5 36.0 - 46.0 %   MCV 101.4 (H) 78.0 - 100.0 fL   MCH 32.4 26.0 - 34.0 pg   MCHC 32.0 30.0 - 36.0 g/dL   RDW 16.7 (H) 11.5 - 15.5 %   Platelets 270 150 - 400 K/uL   Neutrophils Relative % 81 (H) 43 - 77 %   Neutro Abs 13.2 (H) 1.7 - 7.7 K/uL   Lymphocytes Relative 16 12 - 46 %   Lymphs Abs 2.7 0.7 - 4.0 K/uL   Monocytes Relative 3 3 - 12 %   Monocytes Absolute 0.5 0.1 - 1.0 K/uL   Eosinophils Relative 0 0 - 5 %   Eosinophils Absolute 0.0 0.0 - 0.7 K/uL   Basophils Relative 0 0 - 1 %   Basophils Absolute 0.0 0.0 - 0.1 K/uL  Comprehensive metabolic panel     Status: Abnormal   Collection Time: 08/09/14  9:38 PM  Result Value Ref Range   Sodium 140 135 - 145 mmol/L   Potassium 4.0 3.5 - 5.1 mmol/L   Chloride 101 96 - 112 mmol/L   CO2 21 19 - 32 mmol/L   Glucose, Bld 101 (H) 70 - 99 mg/dL   BUN 39 (H) 6 - 23 mg/dL   Creatinine, Ser 6.47 (H) 0.50 - 1.10 mg/dL   Calcium 9.8 8.4 - 10.5 mg/dL   Total Protein 8.3 6.0 - 8.3 g/dL   Albumin 3.2 (L) 3.5 - 5.2 g/dL   AST 20 0 - 37 U/L   ALT 15 0 - 35 U/L   Alkaline Phosphatase 61 39 - 117 U/L   Total Bilirubin 0.9 0.3 - 1.2 mg/dL   GFR calc non Af Amer 5 (L) >90 mL/min   GFR calc Af Amer 6 (L) >90 mL/min    Comment: (NOTE) The eGFR has been calculated using the CKD EPI  equation. This calculation has not been validated in all clinical situations. eGFR's persistently <90 mL/min signify possible Chronic Kidney Disease.    Anion gap 18 (H) 5 - 15  Lipase, blood     Status: None   Collection Time: 08/09/14 10:42 PM  Result Value Ref Range   Lipase 29 11 - 59 U/L  Clostridium Difficile  by PCR     Status: None   Collection Time: 08/10/14  2:46 AM  Result Value Ref Range   C difficile by pcr NEGATIVE NEGATIVE  I-Stat CG4 Lactic Acid, ED     Status: None   Collection Time: 08/10/14  3:38 AM  Result Value Ref Range   Lactic Acid, Venous 0.78 0.5 - 2.0 mmol/L  Protime-INR     Status: Abnormal   Collection Time: 08/10/14  8:25 AM  Result Value Ref Range   Prothrombin Time 19.7 (H) 11.6 - 15.2 seconds   INR 1.66 (H) 0.00 - 1.49  CBC WITH DIFFERENTIAL     Status: Abnormal   Collection Time: 08/10/14  8:25 AM  Result Value Ref Range   WBC 11.3 (H) 4.0 - 10.5 K/uL   RBC 3.14 (L) 3.87 - 5.11 MIL/uL   Hemoglobin 9.8 (L) 12.0 - 15.0 g/dL    Comment: DELTA CHECK NOTED   HCT 30.9 (L) 36.0 - 46.0 %   MCV 98.4 78.0 - 100.0 fL   MCH 31.2 26.0 - 34.0 pg   MCHC 31.7 30.0 - 36.0 g/dL   RDW 16.6 (H) 11.5 - 15.5 %   Platelets 207 150 - 400 K/uL    Comment: DELTA CHECK NOTED   Neutrophils Relative % 71 43 - 77 %   Neutro Abs 8.1 (H) 1.7 - 7.7 K/uL   Lymphocytes Relative 22 12 - 46 %   Lymphs Abs 2.5 0.7 - 4.0 K/uL   Monocytes Relative 5 3 - 12 %   Monocytes Absolute 0.6 0.1 - 1.0 K/uL   Eosinophils Relative 1 0 - 5 %   Eosinophils Absolute 0.1 0.0 - 0.7 K/uL   Basophils Relative 0 0 - 1 %   Basophils Absolute 0.0 0.0 - 0.1 K/uL  Comprehensive metabolic panel     Status: Abnormal   Collection Time: 08/10/14  8:25 AM  Result Value Ref Range   Sodium 136 135 - 145 mmol/L   Potassium 3.4 (L) 3.5 - 5.1 mmol/L   Chloride 102 96 - 112 mmol/L   CO2 19 19 - 32 mmol/L   Glucose, Bld 70 70 - 99 mg/dL   BUN 42 (H) 6 - 23 mg/dL   Creatinine, Ser 6.33 (H) 0.50 - 1.10  mg/dL   Calcium 8.5 8.4 - 10.5 mg/dL   Total Protein 6.2 6.0 - 8.3 g/dL   Albumin 2.6 (L) 3.5 - 5.2 g/dL   AST 18 0 - 37 U/L   ALT 12 0 - 35 U/L   Alkaline Phosphatase 51 39 - 117 U/L   Total Bilirubin 0.8 0.3 - 1.2 mg/dL   GFR calc non Af Amer 6 (L) >90 mL/min   GFR calc Af Amer 6 (L) >90 mL/min    Comment: (NOTE) The eGFR has been calculated using the CKD EPI equation. This calculation has not been validated in all clinical situations. eGFR's persistently <90 mL/min signify possible Chronic Kidney Disease.    Anion gap 15 5 - 15  Type and screen     Status: None   Collection Time: 08/10/14  8:25 AM  Result Value Ref Range   ABO/RH(D) O POS    Antibody Screen NEG    Sample Expiration 08/13/2014   Culture, blood (routine x 2)     Status: None (Preliminary result)   Collection Time: 08/10/14 10:31 AM  Result Value Ref Range   Specimen Description BLOOD RIGHT HAND    Special Requests BOTTLES DRAWN AEROBIC  ONLY 2CC    Culture             BLOOD CULTURE RECEIVED NO GROWTH TO DATE CULTURE WILL BE HELD FOR 5 DAYS BEFORE ISSUING A FINAL NEGATIVE REPORT Performed at [facility]    Report Status PENDING   Culture, blood (routine x 2)     Status: None (Preliminary result)   Collection Time: 08/10/14 10:43 AM  Result Value Ref Range   Specimen Description BLOOD RIGHT HAND    Special Requests      BOTTLES DRAWN AEROBIC AND ANAEROBIC BLUE 10CC RED 3CC   Culture             BLOOD CULTURE RECEIVED NO GROWTH TO DATE CULTURE WILL BE HELD FOR 5 DAYS BEFORE ISSUING A FINAL NEGATIVE REPORT Performed at [facility]    Report Status PENDING    Ct Abdomen Pelvis Wo Contrast  08/10/2014   CLINICAL DATA:  Nausea, vomiting, diarrhea for 3 days. Lower abdominal pain.  EXAM: CT ABDOMEN AND PELVIS WITHOUT CONTRAST  TECHNIQUE: Multidetector CT imaging of the abdomen and pelvis was performed following the standard protocol without IV contrast.  COMPARISON:  None.  FINDINGS: There is  stable cardiomegaly.  There is left basilar fibrosis.  No renal, ureteral, or bladder calculi. No obstructive uropathy. No perinephric stranding is seen. There is a multi-cystic left kidney with the largest measuring 3.5 x 5.2 cm. The right kidney is atrophic. There is a 2.2 x 2.6 cm hypodense, fluid attenuating renal mass most consistent with a cyst. There is a punctate cortical calcification in the right kidney. The bladder is unremarkable.  The liver demonstrates no focal abnormality. There is cholelithiasis. The spleen demonstrates no focal abnormality. The adrenal glands and pancreas are normal.  There is cecal and ascending colon bowel wall thickening and surrounding inflammatory changes most severe at the cecum. There is mild small bowel dilatation with a few air-fluid levels. There are air-fluid levels throughout the colon consistent with diarrhea. There is diverticulosis without evidence of diverticulitis. There is no pneumoperitoneum, pneumatosis, or portal venous gas. There is a small amount of pelvic free fluid. There is no lymphadenopathy.  The abdominal aorta is normal in caliber with atherosclerosis.  There are mild degenerative changes of bilateral SI joints. There is lumbar spine spondylosis.  IMPRESSION: 1. Ascending colitis which may be secondary to an infectious versus inflammatory versus ischemic etiology. There is mild small bowel dilatation with multiple air-fluid levels likely reflecting an ileus. 2. Cholelithiasis. 3. Multicystic left kidney.  Right renal cyst.   Electronically Signed   By: Kathreen Devoid   On: 08/10/2014 01:57    Assessment: Right-sided colitis of acute onset suspect infectious greater than ischemic greater than new onset of inflammatory bowel disease but she has no history of  Plan:  Agree with Cipro and Flagyl and Will monitor stools and hemoglobin and obtain stool culture in addition to C. difficile that is been ordered. Patient is on warfarin for DVT.  Jaylin Roundy  C 08/11/2014, 9:06 AM

## 2014-08-11 NOTE — Progress Notes (Signed)
[facility] PROGRESS NOTE  Phineas DouglasMattie M Chlebowski ONG:[id number] DOB: April 05, 1933 DOA: 08/09/2014 PCP: Dorrene GermanAVBUERE,[name], MD  Assessment/Plan: #1 acute colitis Differential includes infectious versus ischemic colitis as patient was noted to have borderline blood pressure versus inflammatory colitis. Patient with some clinical improvement. C. difficile PCR was negative. GI pathogen panel pending. Continue empiric IV ciprofloxacin and IV Flagyl. Continue clear liquids. Supportive care. GI following and appreciate input and recommendations.  #2 end-stage renal disease on hemodialysis Nephrology has been consulted and following the patient throughout the hospitalization.  #3 dementia Stable.  #4 history of DVT On Coumadin per pharmacy.  #5 prophylaxis Place on heparin for DVT prophylaxis until INR is therapeutic.   Code Status: DO NOT RESUSCITATE Family Communication: Updated patient and daughter at bedside. Disposition Plan: Home when medically stable.   Consultants:  GI: Dr. Madilyn FiremanHayes 08/11/2014  Procedures:  CT abdomen and pelvis 08/10/2014    Antibiotics:  IV ciprofloxacin 08/10/2014  IV Flagyl 08/10/2014  HPI/Subjective: Patient states abdominal pain is improved. Patient tolerating clear liquids. Patient states she still having loose stools.  Objective: Filed Vitals:   08/11/14 1606  BP: 130/77  Pulse: 93  Temp: 97.9 F (36.6 C)  Resp: 14    Intake/Output Summary (Last 24 hours) at 08/11/14 1712 Last data filed at 08/11/14 1606  Gross per 24 hour  Intake    120 ml  Output    662 ml  Net   -542 ml   Filed Weights   08/10/14 2152 08/11/14 1304 08/11/14 1606  Weight: 54.477 kg (120 lb 1.6 oz) 55.5 kg (122 lb 5.7 oz) 55.5 kg (122 lb 5.7 oz)    Exam:   General:  NAD  Cardiovascular: RRR  Respiratory: CTAB  Abdomen: Soft, some tenderness in the right lower quadrant to palpation, nondistended, positive bowel sounds.  Musculoskeletal: No clubbing  cyanosis or edema.  Data Reviewed: Basic Metabolic Panel:  Recent Labs Lab 08/09/14 2138 08/10/14 0825 08/11/14 0500  NA 140 136 135  K 4.0 3.4* 3.0*  CL 101 102 102  CO2 21 19 19   GLUCOSE 101* 70 96  BUN 39* 42* 48*  CREATININE 6.47* 6.33* 7.02*  CALCIUM 9.8 8.5 7.5*   Liver Function Tests:  Recent Labs Lab 08/09/14 2138 08/10/14 0825 08/11/14 0500  AST 20 18 13   ALT 15 12 11   ALKPHOS 61 51 47  BILITOT 0.9 0.8 0.6  PROT 8.3 6.2 6.3  ALBUMIN 3.2* 2.6* 2.6*    Recent Labs Lab 08/09/14 2242  LIPASE 29   No results for input(s): AMMONIA in the last 168 hours. CBC:  Recent Labs Lab 08/09/14 2138 08/10/14 0825 08/11/14 0500  WBC 16.4* 11.3* 9.2  NEUTROABS 13.2* 8.1*  --   HGB 12.0 9.8* 9.4*  HCT 37.5 30.9* 29.1*  MCV 101.4* 98.4 95.7  PLT 270 207 250   Cardiac Enzymes: No results for input(s): CKTOTAL, CKMB, CKMBINDEX, TROPONINI in the last 168 hours. BNP (last 3 results)  Recent Labs  07/05/14 1807 07/10/14 2307  BNP 403.0* 223.7*    ProBNP (last 3 results) No results for input(s): PROBNP in the last 8760 hours.  CBG: No results for input(s): GLUCAP in the last 168 hours.  Recent Results (from the past 240 hour(s))  Clostridium Difficile by PCR     Status: None   Collection Time: 08/10/14  2:46 AM  Result Value Ref Range Status   C difficile by pcr NEGATIVE NEGATIVE Final  Culture, blood (routine x 2)  Status: None (Preliminary result)   Collection Time: 08/10/14 10:31 AM  Result Value Ref Range Status   Specimen Description BLOOD RIGHT HAND  Final   Special Requests BOTTLES DRAWN AEROBIC ONLY 2CC  Final   Culture   Final           BLOOD CULTURE RECEIVED NO GROWTH TO DATE CULTURE WILL BE HELD FOR 5 DAYS BEFORE ISSUING A FINAL NEGATIVE REPORT Note: Culture results may be compromised due to an inadequate volume of blood received in culture bottles. Performed at [facility]    Report Status PENDING  Incomplete  Culture, blood  (routine x 2)     Status: None (Preliminary result)   Collection Time: 08/10/14 10:43 AM  Result Value Ref Range Status   Specimen Description BLOOD RIGHT HAND  Final   Special Requests   Final    BOTTLES DRAWN AEROBIC AND ANAEROBIC BLUE 10CC RED 3CC   Culture   Final           BLOOD CULTURE RECEIVED NO GROWTH TO DATE CULTURE WILL BE HELD FOR 5 DAYS BEFORE ISSUING A FINAL NEGATIVE REPORT Note: Culture results may be compromised due to an inadequate volume of blood received in culture bottles. Performed at [facility]    Report Status PENDING  Incomplete     Studies: Ct Abdomen Pelvis Wo Contrast  08/10/2014   CLINICAL DATA:  Nausea, vomiting, diarrhea for 3 days. Lower abdominal pain.  EXAM: CT ABDOMEN AND PELVIS WITHOUT CONTRAST  TECHNIQUE: Multidetector CT imaging of the abdomen and pelvis was performed following the standard protocol without IV contrast.  COMPARISON:  None.  FINDINGS: There is stable cardiomegaly.  There is left basilar fibrosis.  No renal, ureteral, or bladder calculi. No obstructive uropathy. No perinephric stranding is seen. There is a multi-cystic left kidney with the largest measuring 3.5 x 5.2 cm. The right kidney is atrophic. There is a 2.2 x 2.6 cm hypodense, fluid attenuating renal mass most consistent with a cyst. There is a punctate cortical calcification in the right kidney. The bladder is unremarkable.  The liver demonstrates no focal abnormality. There is cholelithiasis. The spleen demonstrates no focal abnormality. The adrenal glands and pancreas are normal.  There is cecal and ascending colon bowel wall thickening and surrounding inflammatory changes most severe at the cecum. There is mild small bowel dilatation with a few air-fluid levels. There are air-fluid levels throughout the colon consistent with diarrhea. There is diverticulosis without evidence of diverticulitis. There is no pneumoperitoneum, pneumatosis, or portal venous gas. There is a small  amount of pelvic free fluid. There is no lymphadenopathy.  The abdominal aorta is normal in caliber with atherosclerosis.  There are mild degenerative changes of bilateral SI joints. There is lumbar spine spondylosis.  IMPRESSION: 1. Ascending colitis which may be secondary to an infectious versus inflammatory versus ischemic etiology. There is mild small bowel dilatation with multiple air-fluid levels likely reflecting an ileus. 2. Cholelithiasis. 3. Multicystic left kidney.  Right renal cyst.   Electronically Signed   By: Elige Ko   On: 08/10/2014 01:57    Scheduled Meds: . cinacalcet  30 mg Oral Q breakfast  . ciprofloxacin  400 mg Intravenous Q24H  . feeding supplement (NEPRO CARB STEADY)  237 mL Oral BID BM  . feeding supplement (RESOURCE BREEZE)  1 Container Oral BID BM  . guaifenesin  200 mg Oral Q4H while awake  . metronidazole  500 mg Intravenous Q8H  . midodrine  10 mg Oral TID WC  . sodium chloride  3 mL Intravenous Q12H  . warfarin  1 mg Oral ONCE-1800  . Warfarin - Pharmacist Dosing Inpatient   Does not apply q1800   Continuous Infusions:   Principal Problem:   Colitis Active Problems:   GERD   ESRD on dialysis   Dementia with behavioral disturbance   Arterial hypotension   DVT (deep venous thrombosis)   Anemia of renal disease    Time spent: 40 mins    [facility] MD [facility] Pager [phone]. If 7PM-7AM, please contact night-coverage at [email or web address], password [facility] 08/11/2014, 5:12 PM  LOS: 2 days

## 2014-08-11 NOTE — Progress Notes (Signed)
[facility] Progress Note  Assessment/Plan: 1. N/V / diarrhea / asc colitis on CT/ Ileus ( may be Ischemic in nature occuring after Hd Tx With HO low bp) Per admit on Cipro/ flagyl Cdif PCR neg, on empiric IV abx for colitis on CT, WBC improved  1/31 - recheck CBC today pre HD 2. ESRD - TTS [facility]- missed hd Sat / K and vol okay so planned HD today (Mon)- check labs pre HD; has catheter and AVF - AVF looks ok to use now (previously had large infiltration and cath was placed ) 3. Hypertension/volume - BP ok on Midodrine 5mg  pre hd per op records ^ 10mg  tid/ no uf in am hd/ 2 kg up by BED WT; review of outpt records show she consistently leaves 4 Kg above EDW with post weights of about 57 - net UF ranges 1 - 3 kg - last post HD weight was 57.6 on 1/28 - weights here lower though still above edw - I think outpt EDW needs to be raised - set goal 1 L today 4. Anemia - On Micera as op Next dose due 08/14/14 hgb 12>9.8 (outpt 9.6 on 1/26) monitor in hosp , no fe 5. Metabolic bone disease - No op binder or vit d , on sensipar Fu ca/phos pre hd 6. Nutrition - CL - add Resource 7. Dementia- about baseline now/ lives with Daughter  [name] SliderMartha B Bergman, PA-Page [facility] Beeper [phone] 08/11/2014,11:31 AM  LOS: 2 days    Renal Attending: Agree with note as articulated above.  For dialysis today. [name]   Subjective:   Denies pain - two stools today; drinking liquids  Objective Filed Vitals:   08/10/14 2152 08/11/14 0555 08/11/14 0556 08/11/14 0819  BP: 107/57 96/51  99/64  Pulse: 81 85  86  Temp: 97.7 F (36.5 Page)  97.9 F (36.6 Page) 97.5 F (36.4 Page)  TempSrc: Oral  Oral Oral  Resp: 16 14  14   Height: 5\' 2"  (1.575 m)     Weight: 54.477 kg (120 lb 1.6 oz)     SpO2: 100% 100%  100%   Physical Exam General: NAD sitting in chair Heart: RRR Lungs: no rales Abdomen: soft NT active BS Extremities: tr LE edema Dialysis Access: right IJ and left upper  AVF + bruit Dialysis Orders: Center: [facility] on TTS . EDW 53 ( Last 2 tx s left at 57.6, 57.3 HD Bath 4k, 2.25ca Time 4hr Heparin 5700. Access LUA AVF, R IJ Perm cath Mircera 150 mcg q 2 weeks Given 07/31/14  Other op labs HGB 9.6 08-07-14, 30% tfs 2765 ferritin Ca 8.8 phos 5.3 Pth 292   Additional Objective Labs: Basic Metabolic Panel:  Recent Labs Lab 08/09/14 2138 08/10/14 0825  NA 140 136  K 4.0 3.4*  CL 101 102  CO2 21 19  GLUCOSE 101* 70  BUN 39* 42*  CREATININE 6.47* 6.33*  CALCIUM 9.8 8.5   Liver Function Tests:  Recent Labs Lab 08/09/14 2138 08/10/14 0825  AST 20 18  ALT 15 12  ALKPHOS 61 51  BILITOT 0.9 0.8  PROT 8.3 6.2  ALBUMIN 3.2* 2.6*    Recent Labs Lab 08/09/14 2242  LIPASE 29   CBC:  Recent Labs Lab 08/09/14 2138 08/10/14 0825  WBC 16.4* 11.3*  NEUTROABS 13.2* 8.1*  HGB 12.0 9.8*  HCT 37.5 30.9*  MCV 101.4* 98.4  PLT 270 207   Blood Culture    Component Value Date/Time  SDES BLOOD RIGHT HAND 08/10/2014 1043   SPECREQUEST  08/10/2014 1043    BOTTLES DRAWN AEROBIC AND ANAEROBIC BLUE 10CC RED 3CC   CULT  08/10/2014 1043           BLOOD CULTURE RECEIVED NO GROWTH TO DATE CULTURE WILL BE HELD FOR 5 DAYS BEFORE ISSUING A FINAL NEGATIVE REPORT Performed at [facility]    REPTSTATUS PENDING 08/10/2014 1043   Studies/Results: Ct Abdomen Pelvis Wo Contrast  08/10/2014   CLINICAL DATA:  Nausea, vomiting, diarrhea for 3 days. Lower abdominal pain.  EXAM: CT ABDOMEN AND PELVIS WITHOUT CONTRAST  TECHNIQUE: Multidetector CT imaging of the abdomen and pelvis was performed following the standard protocol without IV contrast.  COMPARISON:  None.  FINDINGS: There is stable cardiomegaly.  There is left basilar fibrosis.  No renal, ureteral, or bladder calculi. No obstructive uropathy. No perinephric stranding is seen. There is a multi-cystic left kidney with the largest measuring 3.5 x 5.2 cm. The right kidney is atrophic. There  is a 2.2 x 2.6 cm hypodense, fluid attenuating renal mass most consistent with a cyst. There is a punctate cortical calcification in the right kidney. The bladder is unremarkable.  The liver demonstrates no focal abnormality. There is cholelithiasis. The spleen demonstrates no focal abnormality. The adrenal glands and pancreas are normal.  There is cecal and ascending colon bowel wall thickening and surrounding inflammatory changes most severe at the cecum. There is mild small bowel dilatation with a few air-fluid levels. There are air-fluid levels throughout the colon consistent with diarrhea. There is diverticulosis without evidence of diverticulitis. There is no pneumoperitoneum, pneumatosis, or portal venous gas. There is a small amount of pelvic free fluid. There is no lymphadenopathy.  The abdominal aorta is normal in caliber with atherosclerosis.  There are mild degenerative changes of bilateral SI joints. There is lumbar spine spondylosis.  IMPRESSION: 1. Ascending colitis which may be secondary to an infectious versus inflammatory versus ischemic etiology. There is mild small bowel dilatation with multiple air-fluid levels likely reflecting an ileus. 2. Cholelithiasis. 3. Multicystic left kidney.  Right renal cyst.   Electronically Signed   By: [name]   On: 08/10/2014 01:57   Medications:   . cinacalcet  30 mg Oral Q breakfast  . ciprofloxacin  400 mg Intravenous Q24H  . feeding supplement (NEPRO CARB STEADY)  237 mL Oral BID BM  . guaifenesin  200 mg Oral Q4H while awake  . metronidazole  500 mg Intravenous Q8H  . midodrine  10 mg Oral TID WC  . sodium chloride  3 mL Intravenous Q12H

## 2014-08-12 LAB — CBC
HCT: 29.2 % — ABNORMAL LOW (ref 36.0–46.0)
Hemoglobin: 9.6 g/dL — ABNORMAL LOW (ref 12.0–15.0)
MCH: 31.7 pg (ref 26.0–34.0)
MCHC: 32.9 g/dL (ref 30.0–36.0)
MCV: 96.4 fL (ref 78.0–100.0)
Platelets: 187 10*3/uL (ref 150–400)
RBC: 3.03 MIL/uL — AB (ref 3.87–5.11)
RDW: 16.4 % — AB (ref 11.5–15.5)
WBC: 6.8 10*3/uL (ref 4.0–10.5)

## 2014-08-12 LAB — GI PATHOGEN PANEL BY PCR, STOOL
C difficile toxin A/B: NOT DETECTED
CAMPYLOBACTER BY PCR: NOT DETECTED
Cryptosporidium by PCR: NOT DETECTED
E COLI (ETEC) LT/ST: NOT DETECTED
E COLI (STEC): NOT DETECTED
E coli 0157 by PCR: NOT DETECTED
G lamblia by PCR: NOT DETECTED
Norovirus GI/GII: NOT DETECTED
Rotavirus A by PCR: NOT DETECTED
SHIGELLA BY PCR: NOT DETECTED
Salmonella by PCR: NOT DETECTED

## 2014-08-12 LAB — BASIC METABOLIC PANEL
ANION GAP: 8 (ref 5–15)
BUN: 15 mg/dL (ref 6–23)
CHLORIDE: 104 mmol/L (ref 96–112)
CO2: 25 mmol/L (ref 19–32)
CREATININE: 3.84 mg/dL — AB (ref 0.50–1.10)
Calcium: 7.8 mg/dL — ABNORMAL LOW (ref 8.4–10.5)
GFR, EST AFRICAN AMERICAN: 12 mL/min — AB (ref >90)
GFR, EST NON AFRICAN AMERICAN: 10 mL/min — AB (ref >90)
GLUCOSE: 58 mg/dL — AB (ref 70–99)
Potassium: 3.4 mmol/L — ABNORMAL LOW (ref 3.5–5.1)
Sodium: 137 mmol/L (ref 135–145)

## 2014-08-12 LAB — PROTIME-INR
INR: 2.1 — AB (ref 0.00–1.49)
Prothrombin Time: 23.7 seconds — ABNORMAL HIGH (ref 11.6–15.2)

## 2014-08-12 LAB — HEPATITIS B SURFACE ANTIGEN: Hepatitis B Surface Ag: NEGATIVE

## 2014-08-12 LAB — HEPATITIS B SURFACE ANTIBODY,QUALITATIVE: Hep B S Ab: NONREACTIVE — AB

## 2014-08-12 MED ORDER — LORAZEPAM 2 MG/ML IJ SOLN
1.0000 mg | Freq: Once | INTRAMUSCULAR | Status: AC
  Administered 2014-08-12: 1 mg via INTRAVENOUS
  Filled 2014-08-12: qty 1

## 2014-08-12 MED ORDER — WARFARIN SODIUM 2 MG PO TABS
2.0000 mg | ORAL_TABLET | Freq: Once | ORAL | Status: AC
  Administered 2014-08-12: 2 mg via ORAL
  Filled 2014-08-12: qty 1

## 2014-08-12 NOTE — Progress Notes (Signed)
[facility] Progress Note  Subjective: Patient lethargic and sleepy states abdominal pain and diarrhea somewhat better  Objective: Vital signs in last 24 hours: Temp:  [97.5 F (36.4 C)-97.9 F (36.6 C)] 97.8 F (36.6 C) (02/02 0500) Pulse Rate:  [65-93] 77 (02/02 0500) Resp:  [12-16] 14 (02/02 0500) BP: (98-131)/(54-77) 114/54 mmHg (02/02 0500) SpO2:  [100 %] 100 % (02/02 0500) Weight:  [55.5 kg (122 lb 5.7 oz)] 55.5 kg (122 lb 5.7 oz) (02/01 2100) Weight change: 1.023 kg (2 lb 4.1 oz)   PE: Abdomen soft minimal tenderness  Lab Results: Results for orders placed or performed during the hospital encounter of 08/09/14 (from the past 24 hour(s))  Hepatitis B surface antigen     Status: None   Collection Time: 08/11/14  1:25 PM  Result Value Ref Range   Hepatitis B Surface Ag NEGATIVE NEGATIVE  Protime-INR     Status: Abnormal   Collection Time: 08/12/14  5:55 AM  Result Value Ref Range   Prothrombin Time 23.7 (H) 11.6 - 15.2 seconds   INR 2.10 (H) 0.00 - 1.49  CBC     Status: Abnormal   Collection Time: 08/12/14  5:55 AM  Result Value Ref Range   WBC 6.8 4.0 - 10.5 K/uL   RBC 3.03 (L) 3.87 - 5.11 MIL/uL   Hemoglobin 9.6 (L) 12.0 - 15.0 g/dL   HCT 40.929.2 (L) 81.136.0 - 91.446.0 %   MCV 96.4 78.0 - 100.0 fL   MCH 31.7 26.0 - 34.0 pg   MCHC 32.9 30.0 - 36.0 g/dL   RDW 78.216.4 (H) 95.611.5 - 21.315.5 %   Platelets 187 150 - 400 K/uL  Basic metabolic panel     Status: Abnormal   Collection Time: 08/12/14  5:55 AM  Result Value Ref Range   Sodium 137 135 - 145 mmol/L   Potassium 3.4 (L) 3.5 - 5.1 mmol/L   Chloride 104 96 - 112 mmol/L   CO2 25 19 - 32 mmol/L   Glucose, Bld 58 (L) 70 - 99 mg/dL   BUN 15 6 - 23 mg/dL   Creatinine, Ser 0.863.84 (H) 0.50 - 1.10 mg/dL   Calcium 7.8 (L) 8.4 - 10.5 mg/dL   GFR calc non Af Amer 10 (L) >90 mL/min   GFR calc Af Amer 12 (L) >90 mL/min   Anion gap 8 5 - 15    Studies/Results: No results found.    Assessment: Right-sided colitis suspect  infectious etiology, C. difficile toxin negative  Plan: Continue Cipro/ Flagyl,  await stool culture  Advance diet as tolerated    Gregroy Dombkowski C 08/12/2014, 8:13 AM

## 2014-08-12 NOTE — Progress Notes (Signed)
[facility] Progress Note  Assessment/Plan: 1. N/V / diarrhea / asc colitis on CT/ Ileus (could be Ischemic in nature occuring after Hd Txw/ hx low BP) Per admit on Cipro/ flagyl Cdif PCR neg, on empiric IV abx for colitis on CT, WBC wnl 2. ESRD - TTS [facility]- missed hd Sat / K and vol okay - Had HD Monday; has  catheter and AVF - AVF looks ok to use now (previously had large infiltration and cath was placed )- off schedule for HD - plan next HD Thursday to get back on schedule and use AVF then 3. Hypertension/volume - BP ok on Midodrine 5mg  pre hd per op records ^ 10mg  tid/ no uf in am hd/ 2 kg up by BED WT; review of outpt records show she consistently leaves 4 Kg above EDW with post weights of about 57 - net UF ranges 1 - 3 kg - last post HD weight was 57.6 on 1/28 - weights here lower though still above edw - I think outpt EDW needs to be raised - set goal 1 L ; net UF Monday 466 with post weight 55.5 = pre weight which is still above EDW 4. Anemia - On Micera as op Next dose due 08/14/14 hgb 12>9.6- stable (outpt 9.6 on 1/26) monitor in hosp , no fe- will dose Aranesp here Thursday  if not d/c by Thursday  5. Metabolic bone disease - No op binder or vit d , on sensipar 6. Nutrition -renal diet + supplements - tells me she like Resource better than nepro 7. Dementia- about baseline now/ lives with Daughter  [place] SliderMartha B Bergman, PA-C [facility] Beeper [phone] 08/12/2014,9:50 AM  LOS: 3 days   Rena Attending: As above. WIll evaluate  Day by day for HD needs, but Thursday should be Ok given diarrhea and stable lytes. Ariyanna Oien C   Subjective:   Tells me she had a loose stool this am and they had to clean me up before I got out of bed.    Objective Filed Vitals:   08/11/14 1737 08/11/14 2100 08/12/14 0500 08/12/14 0927  BP: 123/75 119/57 114/54 93/46  Pulse: 88 72 77 85  Temp: 97.8 F (36.6 C) 97.5 F (36.4 C) 97.8 F (36.6 C) 97.9 F (36.6 C)   TempSrc: Oral Oral Oral Axillary  Resp: 15 16 14 15   Height:      Weight:  55.5 kg (122 lb 5.7 oz)    SpO2: 100% 100% 100% 100%   Physical Exam in recliner sipping on resource and nepro General: NAD pleasantly confused Neuro:  "doesn't know where she is,  "7336" (year); doesn't know the president, when I suggested Obama, she said no he's not the president Heart: RRR Lungs: no rales Abdomen: soft NT Extremities: tr LE edema Dialysis Access: right IJ and left upper AVF + bruit tortuous  Dialysis Orders: Center: [facility] on TTS . EDW 53 ( Last 2 tx s left at 57.6, 57.3 HD Bath 4k, 2.25ca Time 4hr Heparin 5700. Access LUA AVF, R IJ Perm cath Mircera 150 mcg q 2 weeks Given 07/31/14  Other op labs HGB 9.6 08-07-14, 30% tfs 2765 ferritin Ca 8.8 phos 5.3 Pth 292  Additional Objective Labs: Basic Metabolic Panel:  Recent Labs Lab 08/10/14 0825 08/11/14 0500 08/12/14 0555  NA 136 135 137  K 3.4* 3.0* 3.4*  CL 102 102 104  CO2 19 19 25   GLUCOSE 70 96 58*  BUN 42* 48* 15  CREATININE  6.33* 7.02* 3.84*  CALCIUM 8.5 7.5* 7.8*   Liver Function Tests:  Recent Labs Lab 08/09/14 2138 08/10/14 0825 08/11/14 0500  AST ALT ALKPHOS 61 51 47  BILITOT 0.9 0.8 0.6  PROT 8.3 6.2 6.3  ALBUMIN 3.2* 2.6* 2.6*    Recent Labs Lab 08/09/14 2242  LIPASE 29   CBC:  Recent Labs Lab 08/09/14 2138 08/10/14 0825 08/11/14 0500 08/12/14 0555  WBC 16.4* 11.3* 9.2 6.8  NEUTROABS 13.2* 8.1*  --   --   HGB 12.0 9.8* 9.4* 9.6*  HCT 37.5 30.9* 29.1* 29.2*  MCV 101.4* 98.4 95.7 96.4  PLT 270 207 250 187   Blood Culture    Component Value Date/Time   SDES BLOOD RIGHT HAND 08/10/2014 1043   SPECREQUEST  08/10/2014 1043    BOTTLES DRAWN AEROBIC AND ANAEROBIC BLUE 10CC RED 3CC   CULT  08/10/2014 1043           BLOOD CULTURE RECEIVED NO GROWTH TO DATE CULTURE WILL BE HELD FOR 5 DAYS BEFORE ISSUING A FINAL NEGATIVE REPORT Note: Culture results may be  compromised due to an inadequate volume of blood received in culture bottles. Performed at [facility]    REPTSTATUS PENDING 08/10/2014 1043   Medications:   . cinacalcet  30 mg Oral Q breakfast  . ciprofloxacin  400 mg Intravenous Q24H  . feeding supplement (NEPRO CARB STEADY)  237 mL Oral BID BM  . feeding supplement (RESOURCE BREEZE)  1 Container Oral BID BM  . guaifenesin  200 mg Oral Q4H while awake  . heparin subcutaneous  5,000 Units Subcutaneous 3 times per day  . metronidazole  500 mg Intravenous Q8H  . midodrine  10 mg Oral TID WC  . sodium chloride  3 mL Intravenous Q12H  . warfarin  1 mg Oral ONCE-1800  . Warfarin - Pharmacist Dosing Inpatient   Does not apply [phone]

## 2014-08-12 NOTE — Progress Notes (Signed)
ANTICOAGULATION CONSULT NOTE - Follow Up Consult  Pharmacy Consult:  Coumadin Indication:  History of DVT  Allergies  Allergen Reactions  . Nsaids Other (See Comments)    REACTION: Avoid NSAIDS(renal failure)    Patient Measurements: Height: 5\' 2"  (157.5 cm) Weight: 122 lb 5.7 oz (55.5 kg) IBW/kg (Calculated) : 50.1  Vital Signs: Temp: 97.9 F (36.6 C) (02/02 0927) Temp Source: Axillary (02/02 0927) BP: 93/46 mmHg (02/02 0927) Pulse Rate: 85 (02/02 0927)  Labs:  Recent Labs  08/10/14 0825 08/11/14 0500 08/12/14 0555  HGB 9.8* 9.4* 9.6*  HCT 30.9* 29.1* 29.2*  PLT 207 250 187  LABPROT 19.7* 24.6* 23.7*  INR 1.66* 2.20* 2.10*  CREATININE 6.33* 7.02* 3.84*    Estimated Creatinine Clearance: 9.4 mL/min (by C-G formula based on Cr of 3.84).  Assessment: [age] YOF continues on Coumadin from PTA for history of left leg DVT 06/06/14.    Home Coumadin dose: 4 mg daily.  INR is therapeutic today at 2.10. Coumadin 1 mg given on 08/11/14 after INR rose from 1.66>2.20, after Coumadin 5 mg dose on 08/10/14, likely from drug-drug interactions with Cipro and Flagyl.  No bleeding reported.  Goal of Therapy:  INR 2-3  Plan:   Coumadin 2 mg today.  Continue daily PT/INR.  Expect to continue with Coumadin doses lower than home dose while on Cipro and Flagyl.   Discontinue sq heparin, with INR > 2.0 x 2 days. Had been refusing injections, none given.  Dennie Fettersgan, Dereka Lueras [place], [place] Pager: [phone] 08/12/2014, 12:16 PM

## 2014-08-12 NOTE — Progress Notes (Signed)
[facility] PROGRESS NOTE  [name]:[id number] DOB: 15-Jul-1932 DOA: 08/09/2014 PCP: Dorrene German, MD  Assessment/Plan: #1 acute colitis Differential includes infectious versus ischemic colitis as patient was noted to have borderline blood pressure versus inflammatory colitis. Patient with some clinical improvement. C. difficile PCR was negative. GI pathogen panel pending. Continue empiric IV ciprofloxacin and IV Flagyl. Continue clear liquids. Supportive care. GI following and appreciate input and recommendations.  #2 end-stage renal disease on hemodialysis Nephrology has been consulted and following the patient throughout the hospitalization.  #3 dementia Stable.  #4 history of DVT On Coumadin per pharmacy.  #5 prophylaxis INR is therapeutic.   Code Status: DO NOT RESUSCITATE Family Communication: Updated patient and son at bedside. Disposition Plan: Home when medically stable.   Consultants:  GI: Dr. Madilyn Fireman 08/11/2014  Procedures:  CT abdomen and pelvis 08/10/2014    Antibiotics:  IV ciprofloxacin 08/10/2014  IV Flagyl 08/10/2014  HPI/Subjective: Patient states abdominal pain is improved. Patient tolerating clear liquids. Patient sleeping.  Objective: Filed Vitals:   08/12/14 0927  BP: 93/46  Pulse: 85  Temp: 97.9 F (36.6 C)  Resp: 15    Intake/Output Summary (Last 24 hours) at 08/12/14 1227 Last data filed at 08/12/14 1100  Gross per 24 hour  Intake    248 ml  Output    657 ml  Net   -409 ml   Filed Weights   08/11/14 1304 08/11/14 1606 08/11/14 2100  Weight: 55.5 kg (122 lb 5.7 oz) 55.5 kg (122 lb 5.7 oz) 55.5 kg (122 lb 5.7 oz)    Exam:   General:  NAD  Cardiovascular: RRR  Respiratory: CTAB  Abdomen: Soft, some tenderness in the right lower quadrant to palpation, nondistended, positive bowel sounds.  Musculoskeletal: No clubbing cyanosis or edema.  Data Reviewed: Basic Metabolic Panel:  Recent Labs Lab  08/09/14 2138 08/10/14 0825 08/11/14 0500 08/12/14 0555  NA 140 136 135 137  K 4.0 3.4* 3.0* 3.4*  CL 101 102 102 104  CO2 GLUCOSE 101* 70 96 58*  BUN 39* 42* 48* 15  CREATININE 6.47* 6.33* 7.02* 3.84*  CALCIUM 9.8 8.5 7.5* 7.8*   Liver Function Tests:  Recent Labs Lab 08/09/14 2138 08/10/14 0825 08/11/14 0500  AST ALT ALKPHOS 61 51 47  BILITOT 0.9 0.8 0.6  PROT 8.3 6.2 6.3  ALBUMIN 3.2* 2.6* 2.6*    Recent Labs Lab 08/09/14 2242  LIPASE 29   No results for input(s): AMMONIA in the last 168 hours. CBC:  Recent Labs Lab 08/09/14 2138 08/10/14 0825 08/11/14 0500 08/12/14 0555  WBC 16.4* 11.3* 9.2 6.8  NEUTROABS 13.2* 8.1*  --   --   HGB 12.0 9.8* 9.4* 9.6*  HCT 37.5 30.9* 29.1* 29.2*  MCV 101.4* 98.4 95.7 96.4  PLT 270 207 250 187   Cardiac Enzymes: No results for input(s): CKTOTAL, CKMB, CKMBINDEX, TROPONINI in the last 168 hours. BNP (last 3 results)  Recent Labs  07/05/14 1807 07/10/14 2307  BNP 403.0* 223.7*    ProBNP (last 3 results) No results for input(s): PROBNP in the last 8760 hours.  CBG: No results for input(s): GLUCAP in the last 168 hours.  Recent Results (from the past 240 hour(s))  Clostridium Difficile by PCR     Status: None   Collection Time: 08/10/14  2:46 AM  Result Value Ref Range Status   C difficile by pcr NEGATIVE NEGATIVE Final  Culture, blood (routine x 2)     Status: None (Preliminary result)   Collection Time: 08/10/14 10:31 AM  Result Value Ref Range Status   Specimen Description BLOOD RIGHT HAND  Final   Special Requests BOTTLES DRAWN AEROBIC ONLY 2CC  Final   Culture   Final           BLOOD CULTURE RECEIVED NO GROWTH TO DATE CULTURE WILL BE HELD FOR 5 DAYS BEFORE ISSUING A FINAL NEGATIVE REPORT Note: Culture results may be compromised due to an inadequate volume of blood received in culture bottles. [facility]    Report Status PENDING  Incomplete   Culture, blood (routine x 2)     Status: None (Preliminary result)   Collection Time: 08/10/14 10:43 AM  Result Value Ref Range Status   Specimen Description BLOOD RIGHT HAND  Final   Special Requests   Final    BOTTLES DRAWN AEROBIC AND ANAEROBIC BLUE 10CC RED 3CC   Culture   Final           BLOOD CULTURE RECEIVED NO GROWTH TO DATE CULTURE WILL BE HELD FOR 5 DAYS BEFORE ISSUING A FINAL NEGATIVE REPORT Note: Culture results may be compromised due to an inadequate volume of blood received in culture bottles. Performed at [facility]    Report Status PENDING  Incomplete     Studies: No results found.  Scheduled Meds: . cinacalcet  30 mg Oral Q breakfast  . ciprofloxacin  400 mg Intravenous Q24H  . feeding supplement (NEPRO CARB STEADY)  237 mL Oral BID BM  . feeding supplement (RESOURCE BREEZE)  1 Container Oral BID BM  . guaifenesin  200 mg Oral Q4H while awake  . metronidazole  500 mg Intravenous Q8H  . midodrine  10 mg Oral TID WC  . sodium chloride  3 mL Intravenous Q12H  . warfarin  2 mg Oral ONCE-1800  . Warfarin - Pharmacist Dosing Inpatient   Does not apply q1800   Continuous Infusions:   Principal Problem:   Colitis Active Problems:   GERD   ESRD on dialysis   Dementia with behavioral disturbance   Arterial hypotension   DVT (deep venous thrombosis)   Anemia of renal disease    Time spent: 40 mins    [facility] LLCHOMPSON,Oreoluwa Gilmer MD [facility] Pager [phone]. If 7PM-7AM, please contact night-coverage at [email or web address], password [email or web address] 08/12/2014, 12:27 PM  LOS: 3 days

## 2014-08-13 LAB — RENAL FUNCTION PANEL
ANION GAP: 10 (ref 5–15)
Albumin: 2.6 g/dL — ABNORMAL LOW (ref 3.5–5.2)
BUN: 17 mg/dL (ref 6–23)
CALCIUM: 8 mg/dL — AB (ref 8.4–10.5)
CHLORIDE: 102 mmol/L (ref 96–112)
CO2: 24 mmol/L (ref 19–32)
Creatinine, Ser: 5.1 mg/dL — ABNORMAL HIGH (ref 0.50–1.10)
GFR calc Af Amer: 8 mL/min — ABNORMAL LOW (ref >90)
GFR calc non Af Amer: 7 mL/min — ABNORMAL LOW (ref >90)
GLUCOSE: 88 mg/dL (ref 70–99)
Phosphorus: 6.1 mg/dL — ABNORMAL HIGH (ref 2.3–4.6)
Potassium: 3.2 mmol/L — ABNORMAL LOW (ref 3.5–5.1)
Sodium: 136 mmol/L (ref 135–145)

## 2014-08-13 LAB — CBC
HEMATOCRIT: 31.9 % — AB (ref 36.0–46.0)
Hemoglobin: 10.2 g/dL — ABNORMAL LOW (ref 12.0–15.0)
MCH: 31.7 pg (ref 26.0–34.0)
MCHC: 32 g/dL (ref 30.0–36.0)
MCV: 99.1 fL (ref 78.0–100.0)
PLATELETS: 220 10*3/uL (ref 150–400)
RBC: 3.22 MIL/uL — AB (ref 3.87–5.11)
RDW: 16.4 % — ABNORMAL HIGH (ref 11.5–15.5)
WBC: 6.9 10*3/uL (ref 4.0–10.5)

## 2014-08-13 LAB — PROTIME-INR
INR: 2.19 — ABNORMAL HIGH (ref 0.00–1.49)
Prothrombin Time: 24.6 seconds — ABNORMAL HIGH (ref 11.6–15.2)

## 2014-08-13 MED ORDER — CIPROFLOXACIN HCL 500 MG PO TABS
500.0000 mg | ORAL_TABLET | Freq: Every morning | ORAL | Status: DC
  Filled 2014-08-13: qty 1

## 2014-08-13 MED ORDER — METRONIDAZOLE 500 MG PO TABS
500.0000 mg | ORAL_TABLET | Freq: Three times a day (TID) | ORAL | Status: DC
  Filled 2014-08-13 (×3): qty 1

## 2014-08-13 MED ORDER — METRONIDAZOLE 500 MG PO TABS
500.0000 mg | ORAL_TABLET | Freq: Three times a day (TID) | ORAL | Status: DC
  Administered 2014-08-13 – 2014-08-14 (×3): 500 mg via ORAL
  Filled 2014-08-13 (×5): qty 1

## 2014-08-13 MED ORDER — POTASSIUM CHLORIDE CRYS ER 20 MEQ PO TBCR
20.0000 meq | EXTENDED_RELEASE_TABLET | Freq: Once | ORAL | Status: AC
  Administered 2014-08-13: 20 meq via ORAL
  Filled 2014-08-13: qty 1

## 2014-08-13 MED ORDER — WARFARIN SODIUM 2 MG PO TABS
2.0000 mg | ORAL_TABLET | Freq: Every day | ORAL | Status: DC
  Administered 2014-08-13 – 2014-08-14 (×2): 2 mg via ORAL
  Filled 2014-08-13 (×2): qty 1

## 2014-08-13 MED ORDER — CIPROFLOXACIN HCL 500 MG PO TABS
500.0000 mg | ORAL_TABLET | Freq: Every day | ORAL | Status: DC
  Administered 2014-08-14: 500 mg via ORAL
  Filled 2014-08-13 (×2): qty 1

## 2014-08-13 NOTE — Progress Notes (Signed)
[facility] Progress Note  Subjective: Patient without any abdominal pain eating well states she is still seeing some blood in her stools  Objective: Vital signs in last 24 hours: Temp:  [97.9 F (36.6 C)-98 F (36.7 C)] 97.9 F (36.6 C) (02/03 0500) Pulse Rate:  [70] 70 (02/03 0500) Resp:  [16] 16 (02/03 0500) BP: (101-102)/(54-62) 101/54 mmHg (02/03 0500) SpO2:  [99 %-100 %] 99 % (02/03 0500) Weight:  [55.4 kg (122 lb 2.2 oz)] 55.4 kg (122 lb 2.2 oz) (02/02 2100) Weight change: -0.1 kg (-3.5 oz)   PE: Abdomen soft nondistended with normoactive bowel sounds  Lab Results: Results for orders placed or performed during the hospital encounter of 08/09/14 (from the past 24 hour(s))  Protime-INR     Status: Abnormal   Collection Time: 08/13/14  7:20 AM  Result Value Ref Range   Prothrombin Time 24.6 (H) 11.6 - 15.2 seconds   INR 2.19 (H) 0.00 - 1.49  CBC     Status: Abnormal   Collection Time: 08/13/14  7:20 AM  Result Value Ref Range   WBC 6.9 4.0 - 10.5 K/uL   RBC 3.22 (L) 3.87 - 5.11 MIL/uL   Hemoglobin 10.2 (L) 12.0 - 15.0 g/dL   HCT 04.531.9 (L) 40.936.0 - 81.146.0 %   MCV 99.1 78.0 - 100.0 fL   MCH 31.7 26.0 - 34.0 pg   MCHC 32.0 30.0 - 36.0 g/dL   RDW 91.416.4 (H) 78.211.5 - 95.615.5 %   Platelets 220 150 - 400 K/uL  Renal function panel     Status: Abnormal   Collection Time: 08/13/14  7:20 AM  Result Value Ref Range   Sodium 136 135 - 145 mmol/L   Potassium 3.2 (L) 3.5 - 5.1 mmol/L   Chloride 102 96 - 112 mmol/L   CO2 24 19 - 32 mmol/L   Glucose, Bld 88 70 - 99 mg/dL   BUN 17 6 - 23 mg/dL   Creatinine, Ser 2.135.10 (H) 0.50 - 1.10 mg/dL   Calcium 8.0 (L) 8.4 - 10.5 mg/dL   Phosphorus 6.1 (H) 2.3 - 4.6 mg/dL   Albumin 2.6 (L) 3.5 - 5.2 g/dL   GFR calc non Af Amer 7 (L) >90 mL/min   GFR calc Af Amer 8 (L) >90 mL/min   Anion gap 10 5 - 15    Studies/Results: No results found.    Assessment: ascending colitis suspect infectious over ischemic source. Improving  clinically Plan: Continue antibiotics and probably okay to switch over to oral medication. She continues to do well and stools clear possibly discharge in next couple of days    [name] C 08/13/2014, 1:57 PM

## 2014-08-13 NOTE — Progress Notes (Signed)
Per Dr. Richarda OverlieNayana Page discontinued enteric precautions, GI pathogen and C.diff PCR both were negative.  According to her, didn't need the stool culture.

## 2014-08-13 NOTE — Progress Notes (Signed)
[facility] Progress Note  Assessment/Plan: 1. N/V / diarrhea / asc colitis on CT/ Ileus (could be Ischemic in nature occuring after Hd Txw/ hx low BP)  on Cipro/ flagyl Cdif PCR neg, on empiric IV abx for colitis on CT, WBC wnl, afebrile 2. ESRD - TTS [facility]- missed hd Sat / K and vol okay - Had HD Monday; has catheter and AVF - AVF looks ok to use now (previously had large infiltration and cath was placed )- off schedule for HD - plan next HD Thursday to get back on schedule and use AVF then 3. Hypertension/volume - BP ok on Midodrine  pre hd per op records ^  tid/ review of outpt records show she consistently leaves 4 Kg above EDW with post weights of about 57 - net UF ranges 1 - 3 kg - last post HD weight was 57.6 on 1/28 - weights here lower though still above edw - I think outpt EDW needs to be raised - net UF Monday 466 with post weight 55.5 = pre weight which is still above EDW 4. Anemia - On Mircera as op Next dose due 08/14/14 hgb 12>9.6>10.2 - stable (outpt 9.6 on 1/26) monitor in hosp , no fe- will dose Aranesp 60 2/4 5. Metabolic bone disease - No op binder or vit d , on sensipar 6. Nutrition -back on CL + supplements  7. Dementia- about baseline now/ lives with Daughter  Sheffield Slider, PA-C [facility] Beeper [phone] 08/13/2014,8:59 AM  LOS: 4 days    Renal Attending: As above, agree with evaluation and plan as articulated. Hewitt Garner C   Subjective:   Had diarrhea again this am, doesn't know where she is  Objective Filed Vitals:   08/12/14 0500 08/12/14 0927 08/12/14 2100 08/13/14 0500  BP: 114/54 93/46 102/62 101/54  Pulse: 77 85 70 70  Temp: 97.8 F (36.6 C) 97.9 F (36.6 C) 98 F (36.7 C) 97.9 F (36.6 C)  TempSrc: Oral Axillary Oral Oral  Resp: Height:      Weight:   55.4 kg (122 lb 2.2 oz)   SpO2: 100% 100% 100% 99%   Physical Exam General: NAD pleasantly confused Heart: RRR Lungs: nor  rales Abdomen: soft NT Extremities: tr LE edema Dialysis Access: left upper AVF + bruit  Dialysis Orders: Center: [facility] on TTS . EDW 53 ( Last 2 tx s left at 57.6, 57.3 HD Bath 4k, 2.25ca Time 4hr Heparin 5700. Access LUA AVF, R IJ Perm cath Mircera 150 mcg q 2 weeks Given 07/31/14  Other op labs HGB 9.6 08-07-14, 30% tfs 2765 ferritin Ca 8.8 phos 5.3 Pth 292   Additional Objective Labs: Basic Metabolic Panel:  Recent Labs Lab 08/11/14 0500 08/12/14 0555 08/13/14 0720  NA 135 137 136  K 3.0* 3.4* 3.2*  CL 102 104 102  CO2 GLUCOSE 96 58* 88  BUN 48* 15 17  CREATININE 7.02* 3.84* 5.10*  CALCIUM 7.5* 7.8* 8.0*  PHOS  --   --  6.1*   Liver Function Tests:  Recent Labs Lab 08/09/14 2138 08/10/14 0825 08/11/14 0500 08/13/14 0720  AST --   ALT --   ALKPHOS 61 51 47  --   BILITOT 0.9 0.8 0.6  --   PROT 8.3 6.2 6.3  --   ALBUMIN 3.2* 2.6* 2.6* 2.6*    Recent Labs Lab 08/09/14 2242  LIPASE  29   CBC:  Recent Labs Lab 08/09/14 2138 08/10/14 0825 08/11/14 0500 08/12/14 0555 08/13/14 0720  WBC 16.4* 11.3* 9.2 6.8 6.9  NEUTROABS 13.2* 8.1*  --   --   --   HGB 12.0 9.8* 9.4* 9.6* 10.2*  HCT 37.5 30.9* 29.1* 29.2* 31.9*  MCV 101.4* 98.4 95.7 96.4 99.1  PLT 270 207 250 187 220   Blood Culture    Component Value Date/Time   SDES BLOOD RIGHT HAND 08/10/2014 1043   SPECREQUEST  08/10/2014 1043    BOTTLES DRAWN AEROBIC AND ANAEROBIC BLUE 10CC RED 3CC   CULT  08/10/2014 1043           BLOOD CULTURE RECEIVED NO GROWTH TO DATE CULTURE WILL BE HELD FOR 5 DAYS BEFORE ISSUING A FINAL NEGATIVE REPORT Note: Culture results may be compromised due to an inadequate volume of blood received in culture bottles. Performed at [facility]    REPTSTATUS PENDING 08/10/2014 1043   Medications:   . cinacalcet  30 mg Oral Q breakfast  . ciprofloxacin  400 mg Intravenous Q24H  . feeding supplement (NEPRO CARB STEADY)  237 mL  Oral BID BM  . feeding supplement (RESOURCE BREEZE)  1 Container Oral BID BM  . guaifenesin  200 mg Oral Q4H while awake  . metronidazole  500 mg Intravenous Q8H  . midodrine  10 mg Oral TID WC  . sodium chloride  3 mL Intravenous Q12H  . Warfarin - Pharmacist Dosing Inpatient   Does not apply [phone]

## 2014-08-13 NOTE — Evaluation (Signed)
Physical Therapy Evaluation Patient Details Name: [name] MRN: [id number] DOB: Apr 02, 1933 Today's Date: 08/13/2014   History of Present Illness  is a [age] y.o. female with Past medical history of ESRD on hemodialysis, hypertension, dyslipidemia, GERD, and gout. The patient presents with complaints of nausea vomiting and diarrhea. Patient is unable to tell me the duration but in the ER she told that this has been ongoing since last 3 days. Patient went for her routine dialysis on Thursday and after Thursday she started having diarrhea. Patient denies any blood or black color bowel movement. Patient also complains of nausea and vomiting. Patient was recently admitted in the hospital for pneumonia and was treated with antibiotics.  Clinical Impression  Pt admitted with above diagnosis. Pt currently with functional limitations due to the deficits listed below (see PT Problem List). At the time of PT eval pt was able to perform transfers and ambulation with min guard for steadying assist. Pt will benefit from skilled PT to increase their independence and safety with mobility to allow discharge to the venue listed below. Pt and daughter report that pt has HHPT currently coming in to work with her, and will plan on these services continuing at d/c.      Follow Up Recommendations Home health PT;Supervision/Assistance - 24 hour    Equipment Recommendations  None recommended by PT    Recommendations for Other Services       Precautions / Restrictions Precautions Precautions: Fall Restrictions Weight Bearing Restrictions: No      Mobility  Bed Mobility Overal bed mobility: Needs Assistance Bed Mobility: Supine to Sit     Supine to sit: Supervision     General bed mobility comments: Pt was able to transition to EOB with no physical assistance. Use of bed rails and increased time required with supervision for safety.   Transfers Overall transfer level: Needs assistance Equipment  used: 1 person hand held assist Transfers: Sit to/from Stand Sit to Stand: Min guard         General transfer comment: Pt was able to perform sit<>stand transfers with guarding for safety, and HHA once standing to maintain balance.   Ambulation/Gait Ambulation/Gait assistance: Min guard Ambulation Distance (Feet): 125 Feet Assistive device: 1 person hand held assist Gait Pattern/deviations: Step-through pattern;Decreased stride length;Trunk flexed;Narrow base of support Gait velocity: Decreased Gait velocity interpretation: Below normal speed for age/gender General Gait Details: Steadying assist with L side HHA throughout gait training. Pt generally moving slow. Did not report any SOB or fatigue after gait training.  Stairs            Wheelchair Mobility    Modified Rankin (Stroke Patients Only)       Balance Overall balance assessment: Needs assistance Sitting-balance support: Feet supported;No upper extremity supported Sitting balance-Leahy Scale: Good     Standing balance support: Single extremity supported;During functional activity                                 Pertinent Vitals/Pain      Home Living Family/patient expects to be discharged to:: Private residence Living Arrangements: Children Available Help at Discharge: Family;Available 24 hours/day Type of Home: House Home Access: Stairs to enter Entrance Stairs-Rails: Doctor, general practiceight;Left Entrance Stairs-Number of Steps: 2 Home Layout: One level Home Equipment: Cane - quad;Bedside commode;Shower seat;Walker - 2 wheels      Prior Function Level of Independence: Needs assistance   Gait / Transfers  Assistance Needed: Pt uses RW for community ambulation, and typically uses the cane for home mobility.   ADL's / Homemaking Assistance Needed: Daughter reports that pt is at a supervision level for all of her ADL's.        Hand Dominance   Dominant Hand: Right    Extremity/Trunk Assessment    Upper Extremity Assessment: Defer to OT evaluation           Lower Extremity Assessment: Generalized weakness      Cervical / Trunk Assessment: Normal  Communication   Communication: No difficulties  Cognition Arousal/Alertness: Awake/alert Behavior During Therapy: WFL for tasks assessed/performed Overall Cognitive Status: History of cognitive impairments - at baseline                      General Comments      Exercises        Assessment/Plan    PT Assessment Patient needs continued PT services  PT Diagnosis Difficulty walking;Generalized weakness   PT Problem List Decreased strength;Decreased range of motion;Decreased activity tolerance;Decreased balance;Decreased mobility;Decreased knowledge of use of DME;Decreased safety awareness;Decreased knowledge of precautions  PT Treatment Interventions DME instruction;Gait training;Stair training;Functional mobility training;Therapeutic activities;Therapeutic exercise;Neuromuscular re-education;Patient/family education   PT Goals (Current goals can be found in the Care Plan section) Acute Rehab PT Goals Patient Stated Goal: Return home PT Goal Formulation: With patient/family Time For Goal Achievement: 08/20/14 Potential to Achieve Goals: Good    Frequency Min 3X/week   Barriers to discharge        Co-evaluation               End of Session Equipment Utilized During Treatment: Gait belt Activity Tolerance: Patient tolerated treatment well Patient left: in chair;with chair alarm set;with call bell/phone within reach;with family/visitor present Nurse Communication: Mobility status         Time: 1016-1040 PT Time Calculation (min) (ACUTE ONLY): 24 min   Charges:   PT Evaluation $Initial PT Evaluation Tier I: 1 Procedure PT Treatments $Gait Training: 8-22 mins   PT G Codes:        Conni Slipper 07-Sep-2014, 11:01 AM  Conni Slipper, PT, DPT Acute Rehabilitation Services Pager: [phone]

## 2014-08-13 NOTE — Evaluation (Signed)
Occupational Therapy Evaluation and Discharge Patient Details Name: [name] MRN: [id number] DOB: 10-12-1932 Today's Date: 08/13/2014    History of Present Illness is a 81 y.o. female with Past medical history of ESRD on hemodialysis, hypertension, dyslipidemia, GERD, and gout. The patient presents with complaints of nausea vomiting and diarrhea. Patient is unable to tell me the duration but in the ER she told that this has been ongoing since last 3 days. Patient went for her routine dialysis on Thursday and after Thursday she started having diarrhea. Patient denies any blood or black color bowel movement. Patient also complains of nausea and vomiting. Patient was recently admitted in the hospital for pneumonia and was treated with antibiotics.   Clinical Impression   This 81 yo female admitted with above presents to acute OT at her baseline of S and has this at home. No further OT needs, we will sign off.    Follow Up Recommendations  No OT follow up    Equipment Recommendations  None recommended by OT       Precautions / Restrictions Precautions Precautions: Fall Restrictions Weight Bearing Restrictions: No      Mobility Bed Mobility     General bed mobility comments: Pt up in recliner upon arrival with son in room  Transfers Overall transfer level: Needs assistance Equipment used: Quad cane Transfers: Sit to/from Stand Sit to Stand: Supervision         General transfer comment: S to and from bathroom with quad cane         ADL Overall ADL's : At baseline                                       General ADL Comments: S level using a quad cane               Pertinent Vitals/Pain Pain Assessment: No/denies pain     Hand Dominance Right   Extremity/Trunk Assessment Upper Extremity Assessment Upper Extremity Assessment: Overall WFL for tasks assessed     Communication Communication Communication: No difficulties   Cognition  Arousal/Alertness: Awake/alert Behavior During Therapy: WFL for tasks assessed/performed Overall Cognitive Status: History of cognitive impairments - at baseline                                Home Living Family/patient expects to be discharged to:: Private residence Living Arrangements: Children (son and daughter) Available Help at Discharge: Family;Available 24 hours/day Type of Home: House Home Access: Stairs to enter [employer]trance Stairs-Number of Steps: 2 Entrance Stairs-Rails: Right;Left Home Layout: One level     Bathroom Shower/Tub: Tub/shower unit;Curtain Shower/tub characteristics: Engineer, building servicesCurtain Bathroom Toilet: Handicapped height     Home Equipment: Cane - quad;Bedside commode;Walker - 2 wheels;Tub bench          Prior Functioning/Environment Level of Independence: Needs assistance  Gait / Transfers Assistance Needed: Pt uses RW for community ambulation, and typically uses the cane for home mobility.  ADL's / Homemaking Assistance Needed: Pt reports that sometimes has to ask her daughter to help her in and out of the tub with tub bench, otherwise she is at a S level        OT Diagnosis: Generalized weakness;Cognitive deficits         OT Goals(Current goals can be found in the care plan section) Acute Rehab  OT Goals Patient Stated Goal: home soon  OT Frequency:                End of Session Equipment Utilized During Treatment:  (quad cane) Nurse Communication:  (left quad cane for pt to use while she is here.)  Activity Tolerance: Patient tolerated treatment well Patient left: in chair;with call bell/phone within reach;with chair alarm set   Time: 4098-1191 OT Time Calculation (min): 13 min Charges:  OT General Charges $OT Visit: 1 Procedure OT Evaluation $Initial OT Evaluation Tier I: 1 Procedure  Evette Georges [phone] 08/13/2014, 2:31 PM

## 2014-08-13 NOTE — Progress Notes (Addendum)
[facility] PROGRESS NOTE  [name] [id number] DOB: 15-May-1933 DOA: 08/09/2014 PCP: Dorrene GermanAVBUERE,[name], MD  Assessment/Plan: #1Right-sided colitis  acute colitis Differential includes infectious versus ischemic colitis as patient was noted to have borderline blood pressure versus inflammatory colitis. Patient with some clinical improvement. C. difficile PCR was negative. GI pathogen panel negative . switch empiric IV ciprofloxacin and IV Flagyl to po.  Advance diet ,  Supportive care. GI following and appreciate input and recommendations.  #2 end-stage renal disease on hemodialysis Nephrology has been consulted and following the patient throughout the hospitalization. off schedule for HD - plan next HD Thursday  and use AVF    #3 dementia 1. about baseline now/ lives with Daughter  #4 history of DVT On Coumadin per pharmacy.  #5 prophylaxis INR is therapeutic.  6. Hypotension  - BP ok on Midodrine  10mg  tid/  7. Anemia - On Mircera , Next dose due 08/14/14 hgb 12>9.6>10.2 - stable (outpt 9.6 on 1/26)   Code Status: DO NOT RESUSCITATE Family Communication: Updated patient and son at bedside. Disposition Plan: Home 2/4.   Consultants:  GI: Dr. Madilyn FiremanHayes 08/11/2014  Procedures:  CT abdomen and pelvis 08/10/2014    Antibiotics:  IV ciprofloxacin 08/10/2014  IV Flagyl 08/10/2014  HPI/Subjective: Had diarrhea again this am,pleasantly confused   Objective: Filed Vitals:   08/13/14 0500  BP: 101/54  Pulse: 70  Temp: 97.9 F (36.6 C)  Resp: 16    Intake/Output Summary (Last 24 hours) at 08/13/14 1326 Last data filed at 08/12/14 1600  Gross per 24 hour  Intake     30 ml  Output      0 ml  Net     30 ml   Filed Weights   08/11/14 1606 08/11/14 2100 08/12/14 2100  Weight: 55.5 kg (122 lb 5.7 oz) 55.5 kg (122 lb 5.7 oz) 55.4 kg (122 lb 2.2 oz)    Exam:  General: NAD pleasantly confused Heart: RRR Lungs: nor rales Abdomen: soft  NT Extremities: tr LE edema Dialysis Access: left upper AVF + bruit  Data Reviewed: Basic Metabolic Panel:  Recent Labs Lab 08/09/14 2138 08/10/14 0825 08/11/14 0500 08/12/14 0555 08/13/14 0720  NA 140 136 135 137 136  K 4.0 3.4* 3.0* 3.4* 3.2*  CL 101 102 102 104 102  CO2 21 19 19 25 24   GLUCOSE 101* 70 96 58* 88  BUN 39* 42* 48* 15 17  CREATININE 6.47* 6.33* 7.02* 3.84* 5.10*  CALCIUM 9.8 8.5 7.5* 7.8* 8.0*  PHOS  --   --   --   --  6.1*   Liver Function Tests:  Recent Labs Lab 08/09/14 2138 08/10/14 0825 08/11/14 0500 08/13/14 0720  AST 20 18 13   --   ALT 15 12 11   --   ALKPHOS 61 51 47  --   BILITOT 0.9 0.8 0.6  --   PROT 8.3 6.2 6.3  --   ALBUMIN 3.2* 2.6* 2.6* 2.6*    Recent Labs Lab 08/09/14 2242  LIPASE 29   No results for input(s): AMMONIA in the last 168 hours. CBC:  Recent Labs Lab 08/09/14 2138 08/10/14 0825 08/11/14 0500 08/12/14 0555 08/13/14 0720  WBC 16.4* 11.3* 9.2 6.8 6.9  NEUTROABS 13.2* 8.1*  --   --   --   HGB 12.0 9.8* 9.4* 9.6* 10.2*  HCT 37.5 30.9* 29.1* 29.2* 31.9*  MCV 101.4* 98.4 95.7 96.4 99.1  PLT 270 207 250 187 220   Cardiac Enzymes:  No results for input(s): CKTOTAL, CKMB, CKMBINDEX, TROPONINI in the last 168 hours. BNP (last 3 results)  Recent Labs  07/05/14 1807 07/10/14 2307  BNP 403.0* 223.7*    ProBNP (last 3 results) No results for input(s): PROBNP in the last 8760 hours.  CBG: No results for input(s): GLUCAP in the last 168 hours.  Recent Results (from the past 240 hour(s))  Clostridium Difficile by PCR     Status: None   Collection Time: 08/10/14  2:46 AM  Result Value Ref Range Status   C difficile by pcr NEGATIVE NEGATIVE Final  Culture, blood (routine x 2)     Status: None (Preliminary result)   Collection Time: 08/10/14 10:31 AM  Result Value Ref Range Status   Specimen Description BLOOD RIGHT HAND  Final   Special Requests BOTTLES DRAWN AEROBIC ONLY 2CC  Final   Culture   Final            BLOOD CULTURE RECEIVED NO GROWTH TO DATE CULTURE WILL BE HELD FOR 5 DAYS BEFORE ISSUING A FINAL NEGATIVE REPORT Note: Culture results may be compromised due to an inadequate volume of blood received in culture bottles. Performed at [facility]    Report Status PENDING  Incomplete  Culture, blood (routine x 2)     Status: None (Preliminary result)   Collection Time: 08/10/14 10:43 AM  Result Value Ref Range Status   Specimen Description BLOOD RIGHT HAND  Final   Special Requests   Final    BOTTLES DRAWN AEROBIC AND ANAEROBIC BLUE 10CC RED 3CC   Culture   Final           BLOOD CULTURE RECEIVED NO GROWTH TO DATE CULTURE WILL BE HELD FOR 5 DAYS BEFORE ISSUING A FINAL NEGATIVE REPORT Note: Culture results may be compromised due to an inadequate volume of blood received in culture bottles. Performed at [facility]    Report Status PENDING  Incomplete     Studies: No results found.  Scheduled Meds: . cinacalcet  30 mg Oral Q breakfast  . ciprofloxacin  400 mg Intravenous Q24H  . feeding supplement (NEPRO CARB STEADY)  237 mL Oral BID BM  . feeding supplement (RESOURCE BREEZE)  1 Container Oral BID BM  . guaifenesin  200 mg Oral Q4H while awake  . metronidazole  500 mg Intravenous Q8H  . midodrine  10 mg Oral TID WC  . potassium chloride  20 mEq Oral Once  . sodium chloride  3 mL Intravenous Q12H  . warfarin  2 mg Oral q1800  . Warfarin - Pharmacist Dosing Inpatient   Does not apply q1800   Continuous Infusions:   Principal Problem:   Colitis Active Problems:   GERD   ESRD on dialysis   Dementia with behavioral disturbance   Arterial hypotension   DVT (deep venous thrombosis)   Anemia of renal disease    Time spent: 40 mins    [facility] MD [facility] Pager [phone]. If 7PM-7AM, please contact night-coverage at [email or web address], password [facility] 08/13/2014, 1:26 PM  LOS: 4 days

## 2014-08-13 NOTE — Progress Notes (Signed)
ANTICOAGULATION CONSULT NOTE - Follow Up Consult  Pharmacy Consult:  Coumadin Indication:  History of DVT (06/06/14)  Allergies  Allergen Reactions  . Nsaids Other (See Comments)    REACTION: Avoid NSAIDS(renal failure)    Patient Measurements: Height: 5\' 2"  (157.5 cm) Weight: 122 lb 2.2 oz (55.4 kg) IBW/kg (Calculated) : 50.1  Vital Signs: Temp: 97.9 F (36.6 C) (02/03 0500) Temp Source: Oral (02/03 0500) BP: 101/54 mmHg (02/03 0500) Pulse Rate: 70 (02/03 0500)  Labs:  Recent Labs  08/11/14 0500 08/12/14 0555 08/13/14 0720  HGB 9.4* 9.6* 10.2*  HCT 29.1* 29.2* 31.9*  PLT 250 187 220  LABPROT 24.6* 23.7* 24.6*  INR 2.20* 2.10* 2.19*  CREATININE 7.02* 3.84* 5.10*    Estimated Creatinine Clearance: 7.1 mL/min (by C-G formula based on Cr of 5.1).  Assessment: 1089 YOF continues on Coumadin from PTA for history of left leg DVT 06/06/14.    Home Coumadin dose: 4 mg daily.  INR remains therapeutic at 2.19. Currently requiring less than home Coumadin dose, likely from drug-drug interactions with Cipro and Flagyl.  No bleeding reported.  Goal of Therapy:  INR 2-3  Plan:   Coumadin 2 mg daily for now.  Continue daily PT/INR.  Expect to continue with Coumadin doses lower than home dose (4 mg daily) while on Cipro and Flagyl.  Dennie Fettersgan, Shalika Arntz Donovan, [place] Pager: [phone] 08/13/2014, 12:21 PM

## 2014-08-14 LAB — PROTIME-INR
INR: 2.41 — ABNORMAL HIGH (ref 0.00–1.49)
PROTHROMBIN TIME: 26.5 s — AB (ref 11.6–15.2)

## 2014-08-14 MED ORDER — BOOST / RESOURCE BREEZE PO LIQD: 1.0000 | Freq: Two times a day (BID) | ORAL | Status: DC

## 2014-08-14 MED ORDER — METRONIDAZOLE 500 MG PO TABS: 500.0000 mg | ORAL_TABLET | Freq: Three times a day (TID) | ORAL | Status: AC

## 2014-08-14 MED ORDER — RENA-VITE PO TABS: 1.0000 | ORAL_TABLET | Freq: Every day | ORAL | Status: DC

## 2014-08-14 MED ORDER — DARBEPOETIN ALFA 60 MCG/0.3ML IJ SOSY
PREFILLED_SYRINGE | INTRAMUSCULAR | Status: AC
  Filled 2014-08-14: qty 0.3

## 2014-08-14 MED ORDER — POTASSIUM CHLORIDE CRYS ER 20 MEQ PO TBCR
40.0000 meq | EXTENDED_RELEASE_TABLET | Freq: Once | ORAL | Status: AC
  Administered 2014-08-14: 40 meq via ORAL
  Filled 2014-08-14: qty 2

## 2014-08-14 MED ORDER — DARBEPOETIN ALFA 60 MCG/0.3ML IJ SOSY
60.0000 ug | PREFILLED_SYRINGE | INTRAMUSCULAR | Status: AC
  Administered 2014-08-14: 60 ug via INTRAVENOUS
  Filled 2014-08-14: qty 0.3

## 2014-08-14 MED ORDER — CIPROFLOXACIN HCL 500 MG PO TABS: 500.0000 mg | ORAL_TABLET | Freq: Every day | ORAL | Status: AC

## 2014-08-14 NOTE — Discharge Summary (Signed)
Physician Discharge Summary  [name] MRN: [id number] DOB/AGE: [id number] y.o.  PCP: Philis Fendt, MD   Admit date: 08/09/2014 Discharge date: 08/14/2014  Discharge Diagnoses:    ascending colitis suspect infectious over ischemic source Active Problems:   GERD   ESRD on dialysis   Dementia with behavioral disturbance   Arterial hypotension   DVT (deep venous thrombosis)   Anemia of renal disease DO NOT RESUSCITATE  Follow-up recommendations PT/INR follow-up in 2/8 Follow-up with PCP on 2/8 Follow-up CBC, CMP     Medication List    STOP taking these medications        amoxicillin-clavulanate 875-125 MG per tablet  Commonly known as:  AUGMENTIN      TAKE these medications        cinacalcet 30 MG tablet  Commonly known as:  SENSIPAR  Take 1 tablet (30 mg total) by mouth daily with breakfast.     ciprofloxacin 500 MG tablet  Commonly known as:  CIPRO  Take 1 tablet (500 mg total) by mouth daily.     feeding supplement (NEPRO CARB STEADY) Liqd  Take 237 mLs by mouth 2 (two) times daily between meals.     feeding supplement (RESOURCE BREEZE) Liqd  Take 1 Container by mouth 2 (two) times daily between meals.     FLOVENT DISKUS 100 MCG/BLIST Aepb  Generic drug:  Fluticasone Propionate (Inhal)  Inhale 2 puffs into the lungs daily as needed.     guaifenesin 100 MG/5ML syrup  Commonly known as:  ROBITUSSIN  Take 10 mLs (200 mg total) by mouth every 4 (four) hours while awake.     HYDROcodone-acetaminophen 5-325 MG per tablet  Commonly known as:  NORCO/VICODIN  Take 1-2 tablets by mouth every 4 (four) hours as needed for moderate pain or severe pain.     LORazepam 0.5 MG tablet  Commonly known as:  ATIVAN  Take 1 tablet (0.5 mg total) by mouth every 12 (twelve) hours as needed for anxiety. Also-may give 1 hour prior to dialysis     metroNIDAZOLE 500 MG tablet  Commonly known as:  FLAGYL  Take 1 tablet (500 mg total) by mouth every 8 (eight)  hours.     midodrine 10 MG tablet  Commonly known as:  PROAMATINE  Take 1 tablet (10 mg total) by mouth 3 (three) times daily with meals.     multivitamin Tabs tablet  Take 1 tablet by mouth at bedtime.     nitroGLYCERIN 0.4 MG SL tablet  Commonly known as:  NITROSTAT  Place 0.4 mg under the tongue every 5 (five) minutes as needed for chest pain.     PROAIR HFA 108 (90 BASE) MCG/ACT inhaler  Generic drug:  albuterol  Inhale 2 puffs into the lungs every 6 (six) hours as needed for wheezing or shortness of breath.     albuterol (2.5 MG/3ML) 0.083% nebulizer solution  Commonly known as:  PROVENTIL  Take 3 mLs by nebulization daily as needed.     traMADol 50 MG tablet  Commonly known as:  ULTRAM  Take 1 tablet (50 mg total) by mouth every 6 (six) hours as needed for moderate pain.     warfarin 4 MG tablet  Commonly known as:  COUMADIN  Take 1 tablet (4 mg total) by mouth every evening.        Discharge Condition:    Disposition: 01-Home or [facility]   Significant Diagnostic Studies: Ct  Abdomen Pelvis Wo Contrast  08/10/2014   CLINICAL DATA:  Nausea, vomiting, diarrhea for 3 days. Lower abdominal pain.  EXAM: CT ABDOMEN AND PELVIS WITHOUT CONTRAST  TECHNIQUE: Multidetector CT imaging of the abdomen and pelvis was performed following the standard protocol without IV contrast.  COMPARISON:  None.  FINDINGS: There is stable cardiomegaly.  There is left basilar fibrosis.  No renal, ureteral, or bladder calculi. No obstructive uropathy. No perinephric stranding is seen. There is a multi-cystic left kidney with the largest measuring 3.5 x 5.2 cm. The right kidney is atrophic. There is a 2.2 x 2.6 cm hypodense, fluid attenuating renal mass most consistent with a cyst. There is a punctate cortical calcification in the right kidney. The bladder is unremarkable.  The liver demonstrates no focal abnormality. There is cholelithiasis. The spleen  demonstrates no focal abnormality. The adrenal glands and pancreas are normal.  There is cecal and ascending colon bowel wall thickening and surrounding inflammatory changes most severe at the cecum. There is mild small bowel dilatation with a few air-fluid levels. There are air-fluid levels throughout the colon consistent with diarrhea. There is diverticulosis without evidence of diverticulitis. There is no pneumoperitoneum, pneumatosis, or portal venous gas. There is a small amount of pelvic free fluid. There is no lymphadenopathy.  The abdominal aorta is normal in caliber with atherosclerosis.  There are mild degenerative changes of bilateral SI joints. There is lumbar spine spondylosis.  IMPRESSION: 1. Ascending colitis which may be secondary to an infectious versus inflammatory versus ischemic etiology. There is mild small bowel dilatation with multiple air-fluid levels likely reflecting an ileus. 2. Cholelithiasis. 3. Multicystic left kidney.  Right renal cyst.   Electronically Signed   By: Kathreen Devoid   On: 08/10/2014 01:57   Dg Chest 2 View  07/29/2014   CLINICAL DATA:  Chest pressure and hypertension, history of renal failure on dialysis  EXAM: CHEST  2 VIEW  COMPARISON:  07/12/2014  FINDINGS: Cardiac shadow remains enlarged. A dialysis catheter is again noted. The lungs are well aerated bilaterally. Minimal blunting of the left costophrenic angle remains. No sizable effusion is seen. No acute bony abnormality is noted.  IMPRESSION: Stable appearance when compare with the prior exam. No acute abnormality noted.   Electronically Signed   By: Inez Catalina M.D.   On: 07/29/2014 20:14      Microbiology: Recent Results (from the past 240 hour(s))  Clostridium Difficile by PCR     Status: None   Collection Time: 08/10/14  2:46 AM  Result Value Ref Range Status   C difficile by pcr NEGATIVE NEGATIVE Final  Culture, blood (routine x 2)     Status: None (Preliminary result)   Collection Time:  08/10/14 10:31 AM  Result Value Ref Range Status   Specimen Description BLOOD RIGHT HAND  Final   Special Requests BOTTLES DRAWN AEROBIC ONLY 2CC  Final   Culture   Final           BLOOD CULTURE RECEIVED NO GROWTH TO DATE CULTURE WILL BE HELD FOR 5 DAYS BEFORE ISSUING A FINAL NEGATIVE REPORT Note: Culture results may be compromised due to an inadequate volume of blood received in culture bottles. Performed at [facility]    Report Status PENDING  Incomplete  Culture, blood (routine x 2)     Status: None (Preliminary result)   Collection Time: 08/10/14 10:43 AM  Result Value Ref Range Status   Specimen Description BLOOD RIGHT HAND  Final  Special Requests   Final    BOTTLES DRAWN AEROBIC AND ANAEROBIC BLUE 10CC RED 3CC   Culture   Final           BLOOD CULTURE RECEIVED NO GROWTH TO DATE CULTURE WILL BE HELD FOR 5 DAYS BEFORE ISSUING A FINAL NEGATIVE REPORT Note: Culture results may be compromised due to an inadequate volume of blood received in culture bottles. Performed at [facility]    Report Status PENDING  Incomplete     Labs: Results for orders placed or performed during the hospital encounter of 08/09/14 (from the past 48 hour(s))  Protime-INR     Status: Abnormal   Collection Time: 08/13/14  7:20 AM  Result Value Ref Range   Prothrombin Time 24.6 (H) 11.6 - 15.2 seconds   INR 2.19 (H) 0.00 - 1.49  CBC     Status: Abnormal   Collection Time: 08/13/14  7:20 AM  Result Value Ref Range   WBC 6.9 4.0 - 10.5 K/uL   RBC 3.22 (L) 3.87 - 5.11 MIL/uL   Hemoglobin 10.2 (L) 12.0 - 15.0 g/dL   HCT 31.9 (L) 36.0 - 46.0 %   MCV 99.1 78.0 - 100.0 fL   MCH 31.7 26.0 - 34.0 pg   MCHC 32.0 30.0 - 36.0 g/dL   RDW 16.4 (H) 11.5 - 15.5 %   Platelets 220 150 - 400 K/uL  Renal function panel     Status: Abnormal   Collection Time: 08/13/14  7:20 AM  Result Value Ref Range   Sodium 136 135 - 145 mmol/L   Potassium 3.2 (L) 3.5 - 5.1 mmol/L   Chloride 102 96 - 112  mmol/L   CO2 24 19 - 32 mmol/L   Glucose, Bld 88 70 - 99 mg/dL   BUN 17 6 - 23 mg/dL   Creatinine, Ser 5.10 (H) 0.50 - 1.10 mg/dL   Calcium 8.0 (L) 8.4 - 10.5 mg/dL   Phosphorus 6.1 (H) 2.3 - 4.6 mg/dL   Albumin 2.6 (L) 3.5 - 5.2 g/dL   GFR calc non Af Amer 7 (L) >90 mL/min   GFR calc Af Amer 8 (L) >90 mL/min    Comment: (NOTE) The eGFR has been calculated using the CKD EPI equation. This calculation has not been validated in all clinical situations. eGFR's persistently <90 mL/min signify possible Chronic Kidney Disease.    Anion gap 10 5 - 15  Protime-INR     Status: Abnormal   Collection Time: 08/14/14 10:48 AM  Result Value Ref Range   Prothrombin Time 26.5 (H) 11.6 - 15.2 seconds   INR 2.41 (H) 0.00 - 1.49     HPI :[name] is an 81 y.o. black female with end-stage renal disease who presents with nausea vomiting diarrhea apparently nonbloody with some abdominal pain. CT scan of her abdomen showed significant colitis mainly in the cecum and ascending colon. There is no history of recent antibiotics. C. difficile toxin is pending. It is unclear whether she's had any recent antibiotics.. She states that her abdominal pain is a little better today and is tolerating a clear liquid diet without vomiting   HOSPITAL COURSE:  #1Right-sided colitis acute colitis Differential includes infectious versus ischemic colitis as patient was noted to have borderline blood pressure versus inflammatory colitis. Patient with some clinical improvement. C. difficile PCR was negative. GI pathogen panel negative . Initially on empiric IV ciprofloxacin and IV Flagyl to po.  Tolerated advancement of diet Continue by mouth  Cipro and Flagyl for another 5 days and then DC  #2 end-stage renal disease on hemodialysis Hemodialysis Tuesday Thursday Saturday, last hemodialysis 2/4, use AVF  1. Patient has hemodialysis catheter and AVF - AVF looks ok to use now (previously had large infiltration and  cath was placed )- back on schedule today    #3 dementia 2. about baseline now/ lives with Daughter  #4 history of DVT On Coumadin per pharmacy.  #5 prophylaxis INR is therapeutic. INR 2/8  6. Hypotension  - BP ok on Midodrine 110m 3 times a day  7. Anemia - On Mircera , hemoglobin 12>9.6>10.2 - stable (outpt 9.6 on 1/26)  Hemoglobin on 2/3 was 10.2 Repeat CBC in 3-4 days  Discharge Exam:   Blood pressure 95/47, pulse 78, temperature 97.9 F (36.6 C), temperature source Oral, resp. rate 18, height 5' 2"  (1.575 m), weight 55.2 kg (121 lb 11.1 oz), SpO2 98 %.   General: NAD pleasantly confused Heart: RRR Lungs: nor rales Abdomen: soft NT Extremities: tr LE edema Dialysis Access: left upper AVF + bruit      Discharge Instructions    Diet - low sodium heart healthy    Complete by:  As directed      Increase activity slowly    Complete by:  As directed              Signed: Sophina Mitten 08/14/2014, 12:34 PM

## 2014-08-14 NOTE — Discharge Instructions (Addendum)
Take 1/2 of usual Coumadin dose (2 mg instead of 4 mg) daily while on Cipro + Flagyl - for 5 more days (2/5-2/10/16).  Hilarie Fredrickson, RPh   ---------------------------------------  Information on my medicine - Coumadin   (Warfarin)  This medication education was reviewed with me or my healthcare representative as part of my discharge preparation.  The pharmacist that spoke with me during my hospital stay was:  Dennie Fetters, [facility]  Why was Coumadin prescribed for you? Coumadin was prescribed for you because you have a blood clot or a medical condition that can cause an increased risk of forming blood clots. Blood clots can cause serious health problems by blocking the flow of blood to the heart, lung, or brain. Coumadin can prevent harmful blood clots from forming. As a reminder your indication for Coumadin is:   Deep Vein Thrombosis Treatment  What test will check on my response to Coumadin? While on Coumadin (warfarin) you will need to have an INR test regularly to ensure that your dose is keeping you in the desired range. The INR (international normalized ratio) number is calculated from the result of the laboratory test called prothrombin time (PT).  If an INR APPOINTMENT HAS NOT ALREADY BEEN MADE FOR YOU please schedule an appointment to have this lab work done by your health care provider within 7 days. Your INR goal is usually a number between:  2 to 3 or your provider may give you a more narrow range like 2-2.5.  Ask your health care provider during an office visit what your goal INR is.  What  do you need to  know  About  COUMADIN? Take Coumadin (warfarin) exactly as prescribed by your healthcare provider about the same time each day.  DO NOT stop taking without talking to the doctor who prescribed the medication.  Stopping without other blood clot prevention medication to take the place of Coumadin may increase your risk of developing a new clot or stroke.  Get refills before you run  out.  What do you do if you miss a dose? If you miss a dose, take it as soon as you remember on the same day then continue your regularly scheduled regimen the next day.  Do not take two doses of Coumadin at the same time.  Important Safety Information A possible side effect of Coumadin (Warfarin) is an increased risk of bleeding. You should call your healthcare provider right away if you experience any of the following: ? Bleeding from an injury or your nose that does not stop. ? Unusual colored urine (red or dark brown) or unusual colored stools (red or black). ? Unusual bruising for unknown reasons. ? A serious fall or if you hit your head (even if there is no bleeding).  Some foods or medicines interact with Coumadin (warfarin) and might alter your response to warfarin. To help avoid this: ? Eat a balanced diet, maintaining a consistent amount of Vitamin K. ? Notify your provider about major diet changes you plan to make. ? Avoid alcohol or limit your intake to 1 drink for women and 2 drinks for men per day. (1 drink is 5 oz. wine, 12 oz. beer, or 1.5 oz. liquor.)  Make sure that ANY health care provider who prescribes medication for you knows that you are taking Coumadin (warfarin).  Also make sure the healthcare provider who is monitoring your Coumadin knows when you have started a new medication including herbals and non-prescription products.  Coumadin (Warfarin)  Major Drug Interactions  Increased Warfarin Effect Decreased Warfarin Effect  Alcohol (large quantities) Antibiotics (esp. Septra/Bactrim, Flagyl, Cipro) Amiodarone (Cordarone) Aspirin (ASA) Cimetidine (Tagamet) Megestrol (Megace) NSAIDs (ibuprofen, naproxen, etc.) Piroxicam (Feldene) Propafenone (Rythmol SR) Propranolol (Inderal) Isoniazid (INH) Posaconazole (Noxafil) Barbiturates (Phenobarbital) Carbamazepine (Tegretol) Chlordiazepoxide (Librium) Cholestyramine (Questran) Griseofulvin Oral  Contraceptives Rifampin Sucralfate (Carafate) Vitamin K   Coumadin (Warfarin) Major Herbal Interactions  Increased Warfarin Effect Decreased Warfarin Effect  Garlic Ginseng Ginkgo biloba Coenzyme Q10 Green tea St. Johns wort    Coumadin (Warfarin) FOOD Interactions  Eat a consistent number of servings per week of foods HIGH in Vitamin K (1 serving =  cup)  Collards (cooked, or boiled & drained) Kale (cooked, or boiled & drained) Mustard greens (cooked, or boiled & drained) Parsley *serving size only =  cup Spinach (cooked, or boiled & drained) Swiss chard (cooked, or boiled & drained) Turnip greens (cooked, or boiled & drained)  Eat a consistent number of servings per week of foods MEDIUM-HIGH in Vitamin K (1 serving = 1 cup)  Asparagus (cooked, or boiled & drained) Broccoli (cooked, boiled & drained, or raw & chopped) Brussel sprouts (cooked, or boiled & drained) *serving size only =  cup Lettuce, raw (green leaf, endive, romaine) Spinach, raw Turnip greens, raw & chopped   These websites have more information on Coumadin (warfarin):  http://www.king-russell.com/www.coumadin.com; https://www.hines.net/www.ahrq.gov/consumer/coumadin.htm;

## 2014-08-14 NOTE — Progress Notes (Signed)
[facility] Progress Note  Assessment/Plan: 1. N/V / diarrhea / asc colitis on CT/ Ileus ( on Cipro/ flagyl Cdif PCR neg, changed to po abx for colitis on CT, WBC wnl, afebrile - GI feels more likely to have been infectious vs ischemic 2. ESRD - TTS [facility]- missed hd Sat / K and vol okay - Had HD Monday; has catheter and AVF - AVF looks ok to use now (previously had large infiltration and cath was placed )-  back on schedule today and will attempt to use AVF  3. Hypertension/volume - BP ok on Midodrine 5mg  pre hd per op records ^ 10mg  tid/ review of outpt records show she consistently leaves 4 Kg above EDW with post weights of about 57 - net UF ranges 1 - 3 kg - last post HD weight was 57.6 on 1/28 - weights here lower though still above edw - I think outpt EDW needs to be raised - net UF Monday 466 with post weight 55.5 = pre weight which is still above EDW - avoid BP drops- consider weight ^ at d/c 4. Anemia - On Mircera as op Next dose due 08/14/14 hgb 12>9.6>10.2 - stable (outpt 9.6 on 1/26) monitor in hosp , no fe- will dose Aranesp 60 2/4 5. Metabolic bone disease - No op binder or vit d , on sensipar 6. Nutrition -back solid/renal diet + supplements  7. Dementia- about baseline now/ lives with Daughter  Sheffield SliderMartha B Bergman, PA-C [facility] Beeper [phone] 08/14/2014,9:52 AM  LOS: 5 days    Renal Attending: To get back on schedule today with hemodialysis and then discharge per CM. Casara Perrier C   Subjective:   "they told me I'm going home today, no diarrhea"  Objective Filed Vitals:   08/13/14 1728 08/13/14 2100 08/14/14 0500 08/14/14 0945  BP: 107/53 120/67 122/66 95/47  Pulse: 70 73 81 78  Temp: 98.4 F (36.9 C) 97.8 F (36.6 C) 97.9 F (36.6 C) 97.9 F (36.6 C)  TempSrc: Oral Oral Oral Oral  Resp: 18 16 14 18   Height:      Weight:  55.2 kg (121 lb 11.1 oz)    SpO2: 100% 99% 100% 98%   Physical Exam General: cheerful, NAD Heart:  RRR Lungs: no rales Abdomen: soft NT Extremities: tr LE edema Dialysis Access:  Right IJ perm cath and left upper AVF + bruit  Dialysis Orders: Center: [facility] on TTS . EDW 53 ( Last 2 tx s left at 57.6, 57.3 HD Bath 4k, 2.25ca Time 4hr Heparin 5700. Access LUA AVF, R IJ Perm cath Mircera 150 mcg q 2 weeks Given 07/31/14  Other op labs HGB 9.6 08-07-14, 30% tfs 2765 ferritin Ca 8.8 phos 5.3 Pth 292   Additional Objective Labs: Basic Metabolic Panel:  Recent Labs Lab 08/11/14 0500 08/12/14 0555 08/13/14 0720  NA 135 137 136  K 3.0* 3.4* 3.2*  CL 102 104 102  CO2 19 25 24   GLUCOSE 96 58* 88  BUN 48* 15 17  CREATININE 7.02* 3.84* 5.10*  CALCIUM 7.5* 7.8* 8.0*  PHOS  --   --  6.1*   Liver Function Tests:  Recent Labs Lab 08/09/14 2138 08/10/14 0825 08/11/14 0500 08/13/14 0720  AST 20 18 13   --   ALT 15 12 11   --   ALKPHOS 61 51 47  --   BILITOT 0.9 0.8 0.6  --   PROT 8.3 6.2 6.3  --   ALBUMIN 3.2* 2.6* 2.6* 2.6*  Recent Labs Lab 08/09/14 2242  LIPASE 29   CBC:  Recent Labs Lab 08/09/14 2138 08/10/14 0825 08/11/14 0500 08/12/14 0555 08/13/14 0720  WBC 16.4* 11.3* 9.2 6.8 6.9  NEUTROABS 13.2* 8.1*  --   --   --   HGB 12.0 9.8* 9.4* 9.6* 10.2*  HCT 37.5 30.9* 29.1* 29.2* 31.9*  MCV 101.4* 98.4 95.7 96.4 99.1  PLT 270 207 250 187 220   Blood Culture    Component Value Date/Time   SDES BLOOD RIGHT HAND 08/10/2014 1043   SPECREQUEST  08/10/2014 1043    BOTTLES DRAWN AEROBIC AND ANAEROBIC BLUE 10CC RED 3CC   CULT  08/10/2014 1043           BLOOD CULTURE RECEIVED NO GROWTH TO DATE CULTURE WILL BE HELD FOR 5 DAYS BEFORE ISSUING A FINAL NEGATIVE REPORT Note: Culture results may be compromised due to an inadequate volume of blood received in culture bottles. Performed at [facility]    REPTSTATUS PENDING 08/10/2014 1043   Medications:   . cinacalcet  30 mg Oral Q breakfast  . ciprofloxacin  500 mg Oral Daily  . feeding  supplement (NEPRO CARB STEADY)  237 mL Oral BID BM  . feeding supplement (RESOURCE BREEZE)  1 Container Oral BID BM  . guaifenesin  200 mg Oral Q4H while awake  . metroNIDAZOLE  500 mg Oral 3 times per day  . midodrine  10 mg Oral TID WC  . sodium chloride  3 mL Intravenous Q12H  . warfarin  2 mg Oral q1800  . Warfarin - Pharmacist Dosing Inpatient   Does not apply [phone]

## 2014-08-14 NOTE — Progress Notes (Signed)
Patient Discharge:  Disposition: Pt discharged home with son.   Education: Educated on all discharge instructions.   IV: Removed  Telemetry: Removed. CCMD notified  Prescriptions: Sent to Pt's pharmacy  Transportation: Transported by son  Belongings: All belongings taken with pt.

## 2014-08-14 NOTE — Care Management Note (Signed)
CARE MANAGEMENT NOTE 08/14/2014  Patient:  [name] DouglasHICKMAN,[name]   Account Number:  [id number]  Date Initiated:  08/12/2014  Documentation initiated by:  Brianda Beitler  Subjective/Objective Assessment:   CM following for progression and d/c planning.     Action/Plan:   [facility]  [facility] services to begin immediately post d/c.   Anticipated DC Date:  08/14/2014   Anticipated DC Plan:  HOME W HOME HEALTH SERVICES         Choice offered to / List presented to:          [facility]  [facility] RN  HH[facility]2 PT      [facility]  [name]   Status of service:  Completed, signed off Medicare Important Message given?  YES (If response is "NO", the following Medicare IM given date fields will be blank) Date Medicare IM given:  08/14/2014 Medicare IM given by:  Matt Delpizzo Date Additional Medicare IM given:   Additional Medicare IM given by:    Discharge Disposition:  HOME W HOME HEALTH SERVICES  Per UR Regulation:    If discussed at Long Length of Stay Meetings, dates discussed:    Comments:

## 2014-08-14 NOTE — Progress Notes (Signed)
ANTICOAGULATION CONSULT NOTE - Follow Up Consult  Pharmacy Consult:  Coumadin Indication:  History of DVT  Allergies  Allergen Reactions  . Nsaids Other (See Comments)    REACTION: Avoid NSAIDS(renal failure)    Patient Measurements: Height: 5\' 2"  (157.5 cm) Weight: 121 lb 11.1 oz (55.2 kg) IBW/kg (Calculated) : 50.1  Vital Signs: Temp: 97.9 F (36.6 C) (02/04 0945) Temp Source: Oral (02/04 0945) BP: 95/47 mmHg (02/04 0945) Pulse Rate: 78 (02/04 0945)  Labs:  Recent Labs  08/12/14 0555 08/13/14 0720 08/14/14 1048  HGB 9.6* 10.2*  --   HCT 29.2* 31.9*  --   PLT 187 220  --   LABPROT 23.7* 24.6* 26.5*  INR 2.10* 2.19* 2.41*  CREATININE 3.84* 5.10*  --     Estimated Creatinine Clearance: 7.1 mL/min (by C-G formula based on Cr of 5.1).  Assessment: [age] YOF continues on Coumadin from PTA for history of left leg DVT 06/06/14.    Home Coumadin dose: 4 mg daily.  INR is therapeutic today at 2.41.    Currently on Coumadin 2 mg daily due drug-drug interactions with Cipro and Flagyl, day # 5   Expect discharge later today, after dialysis.  Spoke with patient and son about current Coumadin dose.  Daughter will be here ~3pm today; will return to speak with her after 3pm.  Goal of Therapy:  INR 2-3  Plan:   Continue Coumadin 2 mg daily while on Cipro and Flagyl.  Continue daily PT/INR while in the hospital.  Suggest outpatient PT/INR next week.   Could increase Coumadin back to 4 mg daily when CIpro + Flagyl course completed.      Dennie Fettersgan, Wilver Tignor Donovan, [place] Pager: [phone] 08/14/2014, 12:11 PM

## 2014-08-14 NOTE — Procedures (Signed)
Patient was seen on dialysis and the procedure was supervised.  BFR 200 (new use to AVF)  Via AVF BP is  99/59.   Patient appears to be tolerating treatment well  Dam Ashraf A 08/14/2014

## 2014-08-15 DIAGNOSIS — D649 Anemia, unspecified: Secondary | ICD-10-CM | POA: Diagnosis not present

## 2014-08-15 DIAGNOSIS — J45909 Unspecified asthma, uncomplicated: Secondary | ICD-10-CM | POA: Diagnosis not present

## 2014-08-15 DIAGNOSIS — E46 Unspecified protein-calorie malnutrition: Secondary | ICD-10-CM | POA: Diagnosis not present

## 2014-08-15 DIAGNOSIS — I82402 Acute embolism and thrombosis of unspecified deep veins of left lower extremity: Secondary | ICD-10-CM | POA: Diagnosis not present

## 2014-08-15 DIAGNOSIS — I120 Hypertensive chronic kidney disease with stage 5 chronic kidney disease or end stage renal disease: Secondary | ICD-10-CM | POA: Diagnosis not present

## 2014-08-15 DIAGNOSIS — Z7901 Long term (current) use of anticoagulants: Secondary | ICD-10-CM | POA: Diagnosis not present

## 2014-08-15 DIAGNOSIS — N186 End stage renal disease: Secondary | ICD-10-CM | POA: Diagnosis not present

## 2014-08-15 DIAGNOSIS — Z5181 Encounter for therapeutic drug level monitoring: Secondary | ICD-10-CM | POA: Diagnosis not present

## 2014-08-15 DIAGNOSIS — F0391 Unspecified dementia with behavioral disturbance: Secondary | ICD-10-CM | POA: Diagnosis not present

## 2014-08-15 DIAGNOSIS — F419 Anxiety disorder, unspecified: Secondary | ICD-10-CM | POA: Diagnosis not present

## 2014-08-16 DIAGNOSIS — F419 Anxiety disorder, unspecified: Secondary | ICD-10-CM | POA: Diagnosis not present

## 2014-08-16 DIAGNOSIS — Z5181 Encounter for therapeutic drug level monitoring: Secondary | ICD-10-CM | POA: Diagnosis not present

## 2014-08-16 DIAGNOSIS — Z23 Encounter for immunization: Secondary | ICD-10-CM | POA: Diagnosis not present

## 2014-08-16 DIAGNOSIS — E46 Unspecified protein-calorie malnutrition: Secondary | ICD-10-CM | POA: Diagnosis not present

## 2014-08-16 DIAGNOSIS — J45909 Unspecified asthma, uncomplicated: Secondary | ICD-10-CM | POA: Diagnosis not present

## 2014-08-16 DIAGNOSIS — F0391 Unspecified dementia with behavioral disturbance: Secondary | ICD-10-CM | POA: Diagnosis not present

## 2014-08-16 DIAGNOSIS — I120 Hypertensive chronic kidney disease with stage 5 chronic kidney disease or end stage renal disease: Secondary | ICD-10-CM | POA: Diagnosis not present

## 2014-08-16 DIAGNOSIS — D509 Iron deficiency anemia, unspecified: Secondary | ICD-10-CM | POA: Diagnosis not present

## 2014-08-16 DIAGNOSIS — N186 End stage renal disease: Secondary | ICD-10-CM | POA: Diagnosis not present

## 2014-08-16 DIAGNOSIS — D688 Other specified coagulation defects: Secondary | ICD-10-CM | POA: Diagnosis not present

## 2014-08-16 DIAGNOSIS — T8249XA Other complication of vascular dialysis catheter, initial encounter: Secondary | ICD-10-CM | POA: Diagnosis not present

## 2014-08-16 DIAGNOSIS — Z7901 Long term (current) use of anticoagulants: Secondary | ICD-10-CM | POA: Diagnosis not present

## 2014-08-16 DIAGNOSIS — D631 Anemia in chronic kidney disease: Secondary | ICD-10-CM | POA: Diagnosis not present

## 2014-08-16 DIAGNOSIS — I82402 Acute embolism and thrombosis of unspecified deep veins of left lower extremity: Secondary | ICD-10-CM | POA: Diagnosis not present

## 2014-08-16 DIAGNOSIS — D649 Anemia, unspecified: Secondary | ICD-10-CM | POA: Diagnosis not present

## 2014-08-16 LAB — CULTURE, BLOOD (ROUTINE X 2)
Culture: NO GROWTH
Culture: NO GROWTH

## 2014-08-18 DIAGNOSIS — D649 Anemia, unspecified: Secondary | ICD-10-CM | POA: Diagnosis not present

## 2014-08-18 DIAGNOSIS — I82402 Acute embolism and thrombosis of unspecified deep veins of left lower extremity: Secondary | ICD-10-CM | POA: Diagnosis not present

## 2014-08-18 DIAGNOSIS — F419 Anxiety disorder, unspecified: Secondary | ICD-10-CM | POA: Diagnosis not present

## 2014-08-18 DIAGNOSIS — E46 Unspecified protein-calorie malnutrition: Secondary | ICD-10-CM | POA: Diagnosis not present

## 2014-08-18 DIAGNOSIS — N186 End stage renal disease: Secondary | ICD-10-CM | POA: Diagnosis not present

## 2014-08-18 DIAGNOSIS — F0391 Unspecified dementia with behavioral disturbance: Secondary | ICD-10-CM | POA: Diagnosis not present

## 2014-08-18 DIAGNOSIS — Z7901 Long term (current) use of anticoagulants: Secondary | ICD-10-CM | POA: Diagnosis not present

## 2014-08-18 DIAGNOSIS — I120 Hypertensive chronic kidney disease with stage 5 chronic kidney disease or end stage renal disease: Secondary | ICD-10-CM | POA: Diagnosis not present

## 2014-08-18 DIAGNOSIS — J45909 Unspecified asthma, uncomplicated: Secondary | ICD-10-CM | POA: Diagnosis not present

## 2014-08-18 DIAGNOSIS — Z5181 Encounter for therapeutic drug level monitoring: Secondary | ICD-10-CM | POA: Diagnosis not present

## 2014-08-19 DIAGNOSIS — D509 Iron deficiency anemia, unspecified: Secondary | ICD-10-CM | POA: Diagnosis not present

## 2014-08-19 DIAGNOSIS — Z23 Encounter for immunization: Secondary | ICD-10-CM | POA: Diagnosis not present

## 2014-08-19 DIAGNOSIS — T8249XA Other complication of vascular dialysis catheter, initial encounter: Secondary | ICD-10-CM | POA: Diagnosis not present

## 2014-08-19 DIAGNOSIS — D631 Anemia in chronic kidney disease: Secondary | ICD-10-CM | POA: Diagnosis not present

## 2014-08-19 DIAGNOSIS — D688 Other specified coagulation defects: Secondary | ICD-10-CM | POA: Diagnosis not present

## 2014-08-19 DIAGNOSIS — N186 End stage renal disease: Secondary | ICD-10-CM | POA: Diagnosis not present

## 2014-08-20 DIAGNOSIS — I82402 Acute embolism and thrombosis of unspecified deep veins of left lower extremity: Secondary | ICD-10-CM | POA: Diagnosis not present

## 2014-08-20 DIAGNOSIS — N186 End stage renal disease: Secondary | ICD-10-CM | POA: Diagnosis not present

## 2014-08-20 DIAGNOSIS — J45909 Unspecified asthma, uncomplicated: Secondary | ICD-10-CM | POA: Diagnosis not present

## 2014-08-20 DIAGNOSIS — K5010 Crohn's disease of large intestine without complications: Secondary | ICD-10-CM | POA: Diagnosis not present

## 2014-08-20 DIAGNOSIS — F419 Anxiety disorder, unspecified: Secondary | ICD-10-CM | POA: Diagnosis not present

## 2014-08-20 DIAGNOSIS — E46 Unspecified protein-calorie malnutrition: Secondary | ICD-10-CM | POA: Diagnosis not present

## 2014-08-20 DIAGNOSIS — D649 Anemia, unspecified: Secondary | ICD-10-CM | POA: Diagnosis not present

## 2014-08-20 DIAGNOSIS — I10 Essential (primary) hypertension: Secondary | ICD-10-CM | POA: Diagnosis not present

## 2014-08-20 DIAGNOSIS — I82409 Acute embolism and thrombosis of unspecified deep veins of unspecified lower extremity: Secondary | ICD-10-CM | POA: Diagnosis not present

## 2014-08-20 DIAGNOSIS — I120 Hypertensive chronic kidney disease with stage 5 chronic kidney disease or end stage renal disease: Secondary | ICD-10-CM | POA: Diagnosis not present

## 2014-08-20 DIAGNOSIS — Z5181 Encounter for therapeutic drug level monitoring: Secondary | ICD-10-CM | POA: Diagnosis not present

## 2014-08-20 DIAGNOSIS — Z7901 Long term (current) use of anticoagulants: Secondary | ICD-10-CM | POA: Diagnosis not present

## 2014-08-20 DIAGNOSIS — F0391 Unspecified dementia with behavioral disturbance: Secondary | ICD-10-CM | POA: Diagnosis not present

## 2014-08-21 DIAGNOSIS — D509 Iron deficiency anemia, unspecified: Secondary | ICD-10-CM | POA: Diagnosis not present

## 2014-08-21 DIAGNOSIS — T8249XA Other complication of vascular dialysis catheter, initial encounter: Secondary | ICD-10-CM | POA: Diagnosis not present

## 2014-08-21 DIAGNOSIS — N186 End stage renal disease: Secondary | ICD-10-CM | POA: Diagnosis not present

## 2014-08-21 DIAGNOSIS — Z23 Encounter for immunization: Secondary | ICD-10-CM | POA: Diagnosis not present

## 2014-08-21 DIAGNOSIS — D631 Anemia in chronic kidney disease: Secondary | ICD-10-CM | POA: Diagnosis not present

## 2014-08-21 DIAGNOSIS — D688 Other specified coagulation defects: Secondary | ICD-10-CM | POA: Diagnosis not present

## 2014-08-22 ENCOUNTER — Encounter: Payer: Self-pay | Admitting: Surgery

## 2014-08-22 DIAGNOSIS — N186 End stage renal disease: Secondary | ICD-10-CM | POA: Diagnosis not present

## 2014-08-22 DIAGNOSIS — E46 Unspecified protein-calorie malnutrition: Secondary | ICD-10-CM | POA: Diagnosis not present

## 2014-08-22 DIAGNOSIS — J45909 Unspecified asthma, uncomplicated: Secondary | ICD-10-CM | POA: Diagnosis not present

## 2014-08-22 DIAGNOSIS — Z5181 Encounter for therapeutic drug level monitoring: Secondary | ICD-10-CM | POA: Diagnosis not present

## 2014-08-22 DIAGNOSIS — I82402 Acute embolism and thrombosis of unspecified deep veins of left lower extremity: Secondary | ICD-10-CM | POA: Diagnosis not present

## 2014-08-22 DIAGNOSIS — I120 Hypertensive chronic kidney disease with stage 5 chronic kidney disease or end stage renal disease: Secondary | ICD-10-CM | POA: Diagnosis not present

## 2014-08-22 DIAGNOSIS — D649 Anemia, unspecified: Secondary | ICD-10-CM | POA: Diagnosis not present

## 2014-08-22 DIAGNOSIS — F419 Anxiety disorder, unspecified: Secondary | ICD-10-CM | POA: Diagnosis not present

## 2014-08-22 DIAGNOSIS — F0391 Unspecified dementia with behavioral disturbance: Secondary | ICD-10-CM | POA: Diagnosis not present

## 2014-08-22 DIAGNOSIS — Z7901 Long term (current) use of anticoagulants: Secondary | ICD-10-CM | POA: Diagnosis not present

## 2014-08-23 DIAGNOSIS — D688 Other specified coagulation defects: Secondary | ICD-10-CM | POA: Diagnosis not present

## 2014-08-23 DIAGNOSIS — N186 End stage renal disease: Secondary | ICD-10-CM | POA: Diagnosis not present

## 2014-08-23 DIAGNOSIS — Z23 Encounter for immunization: Secondary | ICD-10-CM | POA: Diagnosis not present

## 2014-08-23 DIAGNOSIS — D631 Anemia in chronic kidney disease: Secondary | ICD-10-CM | POA: Diagnosis not present

## 2014-08-23 DIAGNOSIS — D509 Iron deficiency anemia, unspecified: Secondary | ICD-10-CM | POA: Diagnosis not present

## 2014-08-23 DIAGNOSIS — T8249XA Other complication of vascular dialysis catheter, initial encounter: Secondary | ICD-10-CM | POA: Diagnosis not present

## 2014-08-25 ENCOUNTER — Ambulatory Visit: Payer: Medicare Other | Admitting: Surgery

## 2014-08-25 DIAGNOSIS — J45909 Unspecified asthma, uncomplicated: Secondary | ICD-10-CM | POA: Diagnosis not present

## 2014-08-25 DIAGNOSIS — Z5181 Encounter for therapeutic drug level monitoring: Secondary | ICD-10-CM | POA: Diagnosis not present

## 2014-08-25 DIAGNOSIS — I82402 Acute embolism and thrombosis of unspecified deep veins of left lower extremity: Secondary | ICD-10-CM | POA: Diagnosis not present

## 2014-08-25 DIAGNOSIS — N186 End stage renal disease: Secondary | ICD-10-CM | POA: Diagnosis not present

## 2014-08-25 DIAGNOSIS — D649 Anemia, unspecified: Secondary | ICD-10-CM | POA: Diagnosis not present

## 2014-08-25 DIAGNOSIS — F419 Anxiety disorder, unspecified: Secondary | ICD-10-CM | POA: Diagnosis not present

## 2014-08-25 DIAGNOSIS — Z7901 Long term (current) use of anticoagulants: Secondary | ICD-10-CM | POA: Diagnosis not present

## 2014-08-25 DIAGNOSIS — E46 Unspecified protein-calorie malnutrition: Secondary | ICD-10-CM | POA: Diagnosis not present

## 2014-08-25 DIAGNOSIS — I120 Hypertensive chronic kidney disease with stage 5 chronic kidney disease or end stage renal disease: Secondary | ICD-10-CM | POA: Diagnosis not present

## 2014-08-25 DIAGNOSIS — F0391 Unspecified dementia with behavioral disturbance: Secondary | ICD-10-CM | POA: Diagnosis not present

## 2014-08-26 DIAGNOSIS — T8249XA Other complication of vascular dialysis catheter, initial encounter: Secondary | ICD-10-CM | POA: Diagnosis not present

## 2014-08-26 DIAGNOSIS — Z23 Encounter for immunization: Secondary | ICD-10-CM | POA: Diagnosis not present

## 2014-08-26 DIAGNOSIS — D631 Anemia in chronic kidney disease: Secondary | ICD-10-CM | POA: Diagnosis not present

## 2014-08-26 DIAGNOSIS — D688 Other specified coagulation defects: Secondary | ICD-10-CM | POA: Diagnosis not present

## 2014-08-26 DIAGNOSIS — D509 Iron deficiency anemia, unspecified: Secondary | ICD-10-CM | POA: Diagnosis not present

## 2014-08-26 DIAGNOSIS — N186 End stage renal disease: Secondary | ICD-10-CM | POA: Diagnosis not present

## 2014-08-27 DIAGNOSIS — I120 Hypertensive chronic kidney disease with stage 5 chronic kidney disease or end stage renal disease: Secondary | ICD-10-CM | POA: Diagnosis not present

## 2014-08-27 DIAGNOSIS — E46 Unspecified protein-calorie malnutrition: Secondary | ICD-10-CM | POA: Diagnosis not present

## 2014-08-27 DIAGNOSIS — J45909 Unspecified asthma, uncomplicated: Secondary | ICD-10-CM | POA: Diagnosis not present

## 2014-08-27 DIAGNOSIS — Z7901 Long term (current) use of anticoagulants: Secondary | ICD-10-CM | POA: Diagnosis not present

## 2014-08-27 DIAGNOSIS — Z5181 Encounter for therapeutic drug level monitoring: Secondary | ICD-10-CM | POA: Diagnosis not present

## 2014-08-27 DIAGNOSIS — D649 Anemia, unspecified: Secondary | ICD-10-CM | POA: Diagnosis not present

## 2014-08-27 DIAGNOSIS — F419 Anxiety disorder, unspecified: Secondary | ICD-10-CM | POA: Diagnosis not present

## 2014-08-27 DIAGNOSIS — I82402 Acute embolism and thrombosis of unspecified deep veins of left lower extremity: Secondary | ICD-10-CM | POA: Diagnosis not present

## 2014-08-27 DIAGNOSIS — F0391 Unspecified dementia with behavioral disturbance: Secondary | ICD-10-CM | POA: Diagnosis not present

## 2014-08-27 DIAGNOSIS — N186 End stage renal disease: Secondary | ICD-10-CM | POA: Diagnosis not present

## 2014-08-28 DIAGNOSIS — D509 Iron deficiency anemia, unspecified: Secondary | ICD-10-CM | POA: Diagnosis not present

## 2014-08-28 DIAGNOSIS — T8249XA Other complication of vascular dialysis catheter, initial encounter: Secondary | ICD-10-CM | POA: Diagnosis not present

## 2014-08-28 DIAGNOSIS — N186 End stage renal disease: Secondary | ICD-10-CM | POA: Diagnosis not present

## 2014-08-28 DIAGNOSIS — D688 Other specified coagulation defects: Secondary | ICD-10-CM | POA: Diagnosis not present

## 2014-08-28 DIAGNOSIS — Z23 Encounter for immunization: Secondary | ICD-10-CM | POA: Diagnosis not present

## 2014-08-28 DIAGNOSIS — D631 Anemia in chronic kidney disease: Secondary | ICD-10-CM | POA: Diagnosis not present

## 2014-08-29 DIAGNOSIS — Z5181 Encounter for therapeutic drug level monitoring: Secondary | ICD-10-CM | POA: Diagnosis not present

## 2014-08-29 DIAGNOSIS — N186 End stage renal disease: Secondary | ICD-10-CM | POA: Diagnosis not present

## 2014-08-29 DIAGNOSIS — J45909 Unspecified asthma, uncomplicated: Secondary | ICD-10-CM | POA: Diagnosis not present

## 2014-08-29 DIAGNOSIS — E46 Unspecified protein-calorie malnutrition: Secondary | ICD-10-CM | POA: Diagnosis not present

## 2014-08-29 DIAGNOSIS — F0391 Unspecified dementia with behavioral disturbance: Secondary | ICD-10-CM | POA: Diagnosis not present

## 2014-08-29 DIAGNOSIS — Z7901 Long term (current) use of anticoagulants: Secondary | ICD-10-CM | POA: Diagnosis not present

## 2014-08-29 DIAGNOSIS — I120 Hypertensive chronic kidney disease with stage 5 chronic kidney disease or end stage renal disease: Secondary | ICD-10-CM | POA: Diagnosis not present

## 2014-08-29 DIAGNOSIS — I82402 Acute embolism and thrombosis of unspecified deep veins of left lower extremity: Secondary | ICD-10-CM | POA: Diagnosis not present

## 2014-08-29 DIAGNOSIS — D649 Anemia, unspecified: Secondary | ICD-10-CM | POA: Diagnosis not present

## 2014-08-29 DIAGNOSIS — F419 Anxiety disorder, unspecified: Secondary | ICD-10-CM | POA: Diagnosis not present

## 2014-08-30 DIAGNOSIS — D509 Iron deficiency anemia, unspecified: Secondary | ICD-10-CM | POA: Diagnosis not present

## 2014-08-30 DIAGNOSIS — N186 End stage renal disease: Secondary | ICD-10-CM | POA: Diagnosis not present

## 2014-08-30 DIAGNOSIS — Z23 Encounter for immunization: Secondary | ICD-10-CM | POA: Diagnosis not present

## 2014-08-30 DIAGNOSIS — T8249XA Other complication of vascular dialysis catheter, initial encounter: Secondary | ICD-10-CM | POA: Diagnosis not present

## 2014-08-30 DIAGNOSIS — D631 Anemia in chronic kidney disease: Secondary | ICD-10-CM | POA: Diagnosis not present

## 2014-08-30 DIAGNOSIS — D688 Other specified coagulation defects: Secondary | ICD-10-CM | POA: Diagnosis not present

## 2014-09-01 DIAGNOSIS — Z7901 Long term (current) use of anticoagulants: Secondary | ICD-10-CM | POA: Diagnosis not present

## 2014-09-01 DIAGNOSIS — Z79891 Long term (current) use of opiate analgesic: Secondary | ICD-10-CM | POA: Diagnosis not present

## 2014-09-01 DIAGNOSIS — I82402 Acute embolism and thrombosis of unspecified deep veins of left lower extremity: Secondary | ICD-10-CM | POA: Diagnosis not present

## 2014-09-01 DIAGNOSIS — D631 Anemia in chronic kidney disease: Secondary | ICD-10-CM | POA: Diagnosis not present

## 2014-09-01 DIAGNOSIS — F0391 Unspecified dementia with behavioral disturbance: Secondary | ICD-10-CM | POA: Diagnosis not present

## 2014-09-01 DIAGNOSIS — R531 Weakness: Secondary | ICD-10-CM | POA: Diagnosis not present

## 2014-09-01 DIAGNOSIS — I120 Hypertensive chronic kidney disease with stage 5 chronic kidney disease or end stage renal disease: Secondary | ICD-10-CM | POA: Diagnosis not present

## 2014-09-01 DIAGNOSIS — E46 Unspecified protein-calorie malnutrition: Secondary | ICD-10-CM | POA: Diagnosis not present

## 2014-09-01 DIAGNOSIS — N186 End stage renal disease: Secondary | ICD-10-CM | POA: Diagnosis not present

## 2014-09-01 DIAGNOSIS — J45909 Unspecified asthma, uncomplicated: Secondary | ICD-10-CM | POA: Diagnosis not present

## 2014-09-04 DIAGNOSIS — D688 Other specified coagulation defects: Secondary | ICD-10-CM | POA: Diagnosis not present

## 2014-09-04 DIAGNOSIS — D631 Anemia in chronic kidney disease: Secondary | ICD-10-CM | POA: Diagnosis not present

## 2014-09-04 DIAGNOSIS — D509 Iron deficiency anemia, unspecified: Secondary | ICD-10-CM | POA: Diagnosis not present

## 2014-09-04 DIAGNOSIS — T8249XA Other complication of vascular dialysis catheter, initial encounter: Secondary | ICD-10-CM | POA: Diagnosis not present

## 2014-09-04 DIAGNOSIS — N186 End stage renal disease: Secondary | ICD-10-CM | POA: Diagnosis not present

## 2014-09-04 DIAGNOSIS — Z23 Encounter for immunization: Secondary | ICD-10-CM | POA: Diagnosis not present

## 2014-09-05 ENCOUNTER — Encounter: Payer: Self-pay | Admitting: Surgery

## 2014-09-05 DIAGNOSIS — F0391 Unspecified dementia with behavioral disturbance: Secondary | ICD-10-CM | POA: Diagnosis not present

## 2014-09-05 DIAGNOSIS — I120 Hypertensive chronic kidney disease with stage 5 chronic kidney disease or end stage renal disease: Secondary | ICD-10-CM | POA: Diagnosis not present

## 2014-09-05 DIAGNOSIS — Z7901 Long term (current) use of anticoagulants: Secondary | ICD-10-CM | POA: Diagnosis not present

## 2014-09-05 DIAGNOSIS — D631 Anemia in chronic kidney disease: Secondary | ICD-10-CM | POA: Diagnosis not present

## 2014-09-05 DIAGNOSIS — R531 Weakness: Secondary | ICD-10-CM | POA: Diagnosis not present

## 2014-09-05 DIAGNOSIS — I82402 Acute embolism and thrombosis of unspecified deep veins of left lower extremity: Secondary | ICD-10-CM | POA: Diagnosis not present

## 2014-09-05 DIAGNOSIS — J45909 Unspecified asthma, uncomplicated: Secondary | ICD-10-CM | POA: Diagnosis not present

## 2014-09-05 DIAGNOSIS — E46 Unspecified protein-calorie malnutrition: Secondary | ICD-10-CM | POA: Diagnosis not present

## 2014-09-05 DIAGNOSIS — Z79891 Long term (current) use of opiate analgesic: Secondary | ICD-10-CM | POA: Diagnosis not present

## 2014-09-05 DIAGNOSIS — N186 End stage renal disease: Secondary | ICD-10-CM | POA: Diagnosis not present

## 2014-09-06 DIAGNOSIS — Z23 Encounter for immunization: Secondary | ICD-10-CM | POA: Diagnosis not present

## 2014-09-06 DIAGNOSIS — D688 Other specified coagulation defects: Secondary | ICD-10-CM | POA: Diagnosis not present

## 2014-09-06 DIAGNOSIS — D631 Anemia in chronic kidney disease: Secondary | ICD-10-CM | POA: Diagnosis not present

## 2014-09-06 DIAGNOSIS — N186 End stage renal disease: Secondary | ICD-10-CM | POA: Diagnosis not present

## 2014-09-06 DIAGNOSIS — T8249XA Other complication of vascular dialysis catheter, initial encounter: Secondary | ICD-10-CM | POA: Diagnosis not present

## 2014-09-06 DIAGNOSIS — D509 Iron deficiency anemia, unspecified: Secondary | ICD-10-CM | POA: Diagnosis not present

## 2014-09-07 DIAGNOSIS — N19 Unspecified kidney failure: Secondary | ICD-10-CM | POA: Diagnosis not present

## 2014-09-08 ENCOUNTER — Ambulatory Visit: Payer: Medicare Other | Admitting: Surgery

## 2014-09-08 DIAGNOSIS — N186 End stage renal disease: Secondary | ICD-10-CM | POA: Diagnosis not present

## 2014-09-08 DIAGNOSIS — Z992 Dependence on renal dialysis: Secondary | ICD-10-CM | POA: Diagnosis not present

## 2014-09-09 DIAGNOSIS — T8249XA Other complication of vascular dialysis catheter, initial encounter: Secondary | ICD-10-CM | POA: Diagnosis not present

## 2014-09-09 DIAGNOSIS — D509 Iron deficiency anemia, unspecified: Secondary | ICD-10-CM | POA: Diagnosis not present

## 2014-09-09 DIAGNOSIS — N186 End stage renal disease: Secondary | ICD-10-CM | POA: Diagnosis not present

## 2014-09-09 DIAGNOSIS — D631 Anemia in chronic kidney disease: Secondary | ICD-10-CM | POA: Diagnosis not present

## 2014-09-09 DIAGNOSIS — D688 Other specified coagulation defects: Secondary | ICD-10-CM | POA: Diagnosis not present

## 2014-09-10 DIAGNOSIS — I82402 Acute embolism and thrombosis of unspecified deep veins of left lower extremity: Secondary | ICD-10-CM | POA: Diagnosis not present

## 2014-09-10 DIAGNOSIS — E46 Unspecified protein-calorie malnutrition: Secondary | ICD-10-CM | POA: Diagnosis not present

## 2014-09-10 DIAGNOSIS — Z7901 Long term (current) use of anticoagulants: Secondary | ICD-10-CM | POA: Diagnosis not present

## 2014-09-10 DIAGNOSIS — I120 Hypertensive chronic kidney disease with stage 5 chronic kidney disease or end stage renal disease: Secondary | ICD-10-CM | POA: Diagnosis not present

## 2014-09-10 DIAGNOSIS — D631 Anemia in chronic kidney disease: Secondary | ICD-10-CM | POA: Diagnosis not present

## 2014-09-10 DIAGNOSIS — J45909 Unspecified asthma, uncomplicated: Secondary | ICD-10-CM | POA: Diagnosis not present

## 2014-09-10 DIAGNOSIS — R531 Weakness: Secondary | ICD-10-CM | POA: Diagnosis not present

## 2014-09-10 DIAGNOSIS — Z79891 Long term (current) use of opiate analgesic: Secondary | ICD-10-CM | POA: Diagnosis not present

## 2014-09-10 DIAGNOSIS — N186 End stage renal disease: Secondary | ICD-10-CM | POA: Diagnosis not present

## 2014-09-10 DIAGNOSIS — F0391 Unspecified dementia with behavioral disturbance: Secondary | ICD-10-CM | POA: Diagnosis not present

## 2014-09-11 DIAGNOSIS — N186 End stage renal disease: Secondary | ICD-10-CM | POA: Diagnosis not present

## 2014-09-11 DIAGNOSIS — D688 Other specified coagulation defects: Secondary | ICD-10-CM | POA: Diagnosis not present

## 2014-09-11 DIAGNOSIS — D509 Iron deficiency anemia, unspecified: Secondary | ICD-10-CM | POA: Diagnosis not present

## 2014-09-11 DIAGNOSIS — T8249XA Other complication of vascular dialysis catheter, initial encounter: Secondary | ICD-10-CM | POA: Diagnosis not present

## 2014-09-11 DIAGNOSIS — D631 Anemia in chronic kidney disease: Secondary | ICD-10-CM | POA: Diagnosis not present

## 2014-09-12 DIAGNOSIS — Z79891 Long term (current) use of opiate analgesic: Secondary | ICD-10-CM | POA: Diagnosis not present

## 2014-09-12 DIAGNOSIS — E46 Unspecified protein-calorie malnutrition: Secondary | ICD-10-CM | POA: Diagnosis not present

## 2014-09-12 DIAGNOSIS — J45909 Unspecified asthma, uncomplicated: Secondary | ICD-10-CM | POA: Diagnosis not present

## 2014-09-12 DIAGNOSIS — N186 End stage renal disease: Secondary | ICD-10-CM | POA: Diagnosis not present

## 2014-09-12 DIAGNOSIS — I82402 Acute embolism and thrombosis of unspecified deep veins of left lower extremity: Secondary | ICD-10-CM | POA: Diagnosis not present

## 2014-09-12 DIAGNOSIS — Z7901 Long term (current) use of anticoagulants: Secondary | ICD-10-CM | POA: Diagnosis not present

## 2014-09-12 DIAGNOSIS — I120 Hypertensive chronic kidney disease with stage 5 chronic kidney disease or end stage renal disease: Secondary | ICD-10-CM | POA: Diagnosis not present

## 2014-09-12 DIAGNOSIS — D631 Anemia in chronic kidney disease: Secondary | ICD-10-CM | POA: Diagnosis not present

## 2014-09-12 DIAGNOSIS — R531 Weakness: Secondary | ICD-10-CM | POA: Diagnosis not present

## 2014-09-12 DIAGNOSIS — F0391 Unspecified dementia with behavioral disturbance: Secondary | ICD-10-CM | POA: Diagnosis not present

## 2014-09-13 DIAGNOSIS — T8249XA Other complication of vascular dialysis catheter, initial encounter: Secondary | ICD-10-CM | POA: Diagnosis not present

## 2014-09-13 DIAGNOSIS — D631 Anemia in chronic kidney disease: Secondary | ICD-10-CM | POA: Diagnosis not present

## 2014-09-13 DIAGNOSIS — N186 End stage renal disease: Secondary | ICD-10-CM | POA: Diagnosis not present

## 2014-09-13 DIAGNOSIS — D688 Other specified coagulation defects: Secondary | ICD-10-CM | POA: Diagnosis not present

## 2014-09-13 DIAGNOSIS — D509 Iron deficiency anemia, unspecified: Secondary | ICD-10-CM | POA: Diagnosis not present

## 2014-09-15 DIAGNOSIS — I120 Hypertensive chronic kidney disease with stage 5 chronic kidney disease or end stage renal disease: Secondary | ICD-10-CM | POA: Diagnosis not present

## 2014-09-15 DIAGNOSIS — Z7901 Long term (current) use of anticoagulants: Secondary | ICD-10-CM | POA: Diagnosis not present

## 2014-09-15 DIAGNOSIS — N186 End stage renal disease: Secondary | ICD-10-CM | POA: Diagnosis not present

## 2014-09-15 DIAGNOSIS — R531 Weakness: Secondary | ICD-10-CM | POA: Diagnosis not present

## 2014-09-15 DIAGNOSIS — D631 Anemia in chronic kidney disease: Secondary | ICD-10-CM | POA: Diagnosis not present

## 2014-09-15 DIAGNOSIS — I82402 Acute embolism and thrombosis of unspecified deep veins of left lower extremity: Secondary | ICD-10-CM | POA: Diagnosis not present

## 2014-09-15 DIAGNOSIS — Z79891 Long term (current) use of opiate analgesic: Secondary | ICD-10-CM | POA: Diagnosis not present

## 2014-09-15 DIAGNOSIS — J45909 Unspecified asthma, uncomplicated: Secondary | ICD-10-CM | POA: Diagnosis not present

## 2014-09-15 DIAGNOSIS — F0391 Unspecified dementia with behavioral disturbance: Secondary | ICD-10-CM | POA: Diagnosis not present

## 2014-09-15 DIAGNOSIS — E46 Unspecified protein-calorie malnutrition: Secondary | ICD-10-CM | POA: Diagnosis not present

## 2014-09-16 DIAGNOSIS — T8249XA Other complication of vascular dialysis catheter, initial encounter: Secondary | ICD-10-CM | POA: Diagnosis not present

## 2014-09-16 DIAGNOSIS — D509 Iron deficiency anemia, unspecified: Secondary | ICD-10-CM | POA: Diagnosis not present

## 2014-09-16 DIAGNOSIS — N186 End stage renal disease: Secondary | ICD-10-CM | POA: Diagnosis not present

## 2014-09-16 DIAGNOSIS — D688 Other specified coagulation defects: Secondary | ICD-10-CM | POA: Diagnosis not present

## 2014-09-16 DIAGNOSIS — D631 Anemia in chronic kidney disease: Secondary | ICD-10-CM | POA: Diagnosis not present

## 2014-09-17 DIAGNOSIS — N186 End stage renal disease: Secondary | ICD-10-CM | POA: Diagnosis not present

## 2014-09-17 DIAGNOSIS — Z7901 Long term (current) use of anticoagulants: Secondary | ICD-10-CM | POA: Diagnosis not present

## 2014-09-17 DIAGNOSIS — I120 Hypertensive chronic kidney disease with stage 5 chronic kidney disease or end stage renal disease: Secondary | ICD-10-CM | POA: Diagnosis not present

## 2014-09-17 DIAGNOSIS — D631 Anemia in chronic kidney disease: Secondary | ICD-10-CM | POA: Diagnosis not present

## 2014-09-17 DIAGNOSIS — I10 Essential (primary) hypertension: Secondary | ICD-10-CM | POA: Diagnosis not present

## 2014-09-17 DIAGNOSIS — J302 Other seasonal allergic rhinitis: Secondary | ICD-10-CM | POA: Diagnosis not present

## 2014-09-17 DIAGNOSIS — I82402 Acute embolism and thrombosis of unspecified deep veins of left lower extremity: Secondary | ICD-10-CM | POA: Diagnosis not present

## 2014-09-17 DIAGNOSIS — M179 Osteoarthritis of knee, unspecified: Secondary | ICD-10-CM | POA: Diagnosis not present

## 2014-09-17 DIAGNOSIS — R531 Weakness: Secondary | ICD-10-CM | POA: Diagnosis not present

## 2014-09-17 DIAGNOSIS — Z79891 Long term (current) use of opiate analgesic: Secondary | ICD-10-CM | POA: Diagnosis not present

## 2014-09-17 DIAGNOSIS — E46 Unspecified protein-calorie malnutrition: Secondary | ICD-10-CM | POA: Diagnosis not present

## 2014-09-17 DIAGNOSIS — J45909 Unspecified asthma, uncomplicated: Secondary | ICD-10-CM | POA: Diagnosis not present

## 2014-09-17 DIAGNOSIS — F0391 Unspecified dementia with behavioral disturbance: Secondary | ICD-10-CM | POA: Diagnosis not present

## 2014-09-18 DIAGNOSIS — D509 Iron deficiency anemia, unspecified: Secondary | ICD-10-CM | POA: Diagnosis not present

## 2014-09-18 DIAGNOSIS — T8249XA Other complication of vascular dialysis catheter, initial encounter: Secondary | ICD-10-CM | POA: Diagnosis not present

## 2014-09-18 DIAGNOSIS — D688 Other specified coagulation defects: Secondary | ICD-10-CM | POA: Diagnosis not present

## 2014-09-18 DIAGNOSIS — N186 End stage renal disease: Secondary | ICD-10-CM | POA: Diagnosis not present

## 2014-09-18 DIAGNOSIS — D631 Anemia in chronic kidney disease: Secondary | ICD-10-CM | POA: Diagnosis not present

## 2014-09-19 DIAGNOSIS — R531 Weakness: Secondary | ICD-10-CM | POA: Diagnosis not present

## 2014-09-19 DIAGNOSIS — N186 End stage renal disease: Secondary | ICD-10-CM | POA: Diagnosis not present

## 2014-09-19 DIAGNOSIS — I82402 Acute embolism and thrombosis of unspecified deep veins of left lower extremity: Secondary | ICD-10-CM | POA: Diagnosis not present

## 2014-09-19 DIAGNOSIS — J45909 Unspecified asthma, uncomplicated: Secondary | ICD-10-CM | POA: Diagnosis not present

## 2014-09-19 DIAGNOSIS — I120 Hypertensive chronic kidney disease with stage 5 chronic kidney disease or end stage renal disease: Secondary | ICD-10-CM | POA: Diagnosis not present

## 2014-09-19 DIAGNOSIS — M545 Low back pain: Secondary | ICD-10-CM | POA: Diagnosis not present

## 2014-09-19 DIAGNOSIS — E46 Unspecified protein-calorie malnutrition: Secondary | ICD-10-CM | POA: Diagnosis not present

## 2014-09-19 DIAGNOSIS — D631 Anemia in chronic kidney disease: Secondary | ICD-10-CM | POA: Diagnosis not present

## 2014-09-19 DIAGNOSIS — Z79891 Long term (current) use of opiate analgesic: Secondary | ICD-10-CM | POA: Diagnosis not present

## 2014-09-19 DIAGNOSIS — F0391 Unspecified dementia with behavioral disturbance: Secondary | ICD-10-CM | POA: Diagnosis not present

## 2014-09-19 DIAGNOSIS — Z7901 Long term (current) use of anticoagulants: Secondary | ICD-10-CM | POA: Diagnosis not present

## 2014-09-20 DIAGNOSIS — N186 End stage renal disease: Secondary | ICD-10-CM | POA: Diagnosis not present

## 2014-09-20 DIAGNOSIS — T8249XA Other complication of vascular dialysis catheter, initial encounter: Secondary | ICD-10-CM | POA: Diagnosis not present

## 2014-09-20 DIAGNOSIS — D631 Anemia in chronic kidney disease: Secondary | ICD-10-CM | POA: Diagnosis not present

## 2014-09-20 DIAGNOSIS — D688 Other specified coagulation defects: Secondary | ICD-10-CM | POA: Diagnosis not present

## 2014-09-20 DIAGNOSIS — D509 Iron deficiency anemia, unspecified: Secondary | ICD-10-CM | POA: Diagnosis not present

## 2014-09-22 DIAGNOSIS — Z992 Dependence on renal dialysis: Secondary | ICD-10-CM | POA: Diagnosis not present

## 2014-09-22 DIAGNOSIS — I871 Compression of vein: Secondary | ICD-10-CM | POA: Diagnosis not present

## 2014-09-22 DIAGNOSIS — N186 End stage renal disease: Secondary | ICD-10-CM | POA: Diagnosis not present

## 2014-09-22 DIAGNOSIS — T82858D Stenosis of vascular prosthetic devices, implants and grafts, subsequent encounter: Secondary | ICD-10-CM | POA: Diagnosis not present

## 2014-09-22 DIAGNOSIS — M79609 Pain in unspecified limb: Secondary | ICD-10-CM | POA: Diagnosis not present

## 2014-09-23 DIAGNOSIS — D631 Anemia in chronic kidney disease: Secondary | ICD-10-CM | POA: Diagnosis not present

## 2014-09-23 DIAGNOSIS — D509 Iron deficiency anemia, unspecified: Secondary | ICD-10-CM | POA: Diagnosis not present

## 2014-09-23 DIAGNOSIS — D688 Other specified coagulation defects: Secondary | ICD-10-CM | POA: Diagnosis not present

## 2014-09-23 DIAGNOSIS — T8249XA Other complication of vascular dialysis catheter, initial encounter: Secondary | ICD-10-CM | POA: Diagnosis not present

## 2014-09-23 DIAGNOSIS — N186 End stage renal disease: Secondary | ICD-10-CM | POA: Diagnosis not present

## 2014-09-24 DIAGNOSIS — I120 Hypertensive chronic kidney disease with stage 5 chronic kidney disease or end stage renal disease: Secondary | ICD-10-CM | POA: Diagnosis not present

## 2014-09-24 DIAGNOSIS — E46 Unspecified protein-calorie malnutrition: Secondary | ICD-10-CM | POA: Diagnosis not present

## 2014-09-24 DIAGNOSIS — D631 Anemia in chronic kidney disease: Secondary | ICD-10-CM | POA: Diagnosis not present

## 2014-09-24 DIAGNOSIS — Z7901 Long term (current) use of anticoagulants: Secondary | ICD-10-CM | POA: Diagnosis not present

## 2014-09-24 DIAGNOSIS — J45909 Unspecified asthma, uncomplicated: Secondary | ICD-10-CM | POA: Diagnosis not present

## 2014-09-24 DIAGNOSIS — R531 Weakness: Secondary | ICD-10-CM | POA: Diagnosis not present

## 2014-09-24 DIAGNOSIS — Z79891 Long term (current) use of opiate analgesic: Secondary | ICD-10-CM | POA: Diagnosis not present

## 2014-09-24 DIAGNOSIS — I82402 Acute embolism and thrombosis of unspecified deep veins of left lower extremity: Secondary | ICD-10-CM | POA: Diagnosis not present

## 2014-09-24 DIAGNOSIS — N186 End stage renal disease: Secondary | ICD-10-CM | POA: Diagnosis not present

## 2014-09-24 DIAGNOSIS — F0391 Unspecified dementia with behavioral disturbance: Secondary | ICD-10-CM | POA: Diagnosis not present

## 2014-09-25 DIAGNOSIS — D631 Anemia in chronic kidney disease: Secondary | ICD-10-CM | POA: Diagnosis not present

## 2014-09-25 DIAGNOSIS — T8249XA Other complication of vascular dialysis catheter, initial encounter: Secondary | ICD-10-CM | POA: Diagnosis not present

## 2014-09-25 DIAGNOSIS — D509 Iron deficiency anemia, unspecified: Secondary | ICD-10-CM | POA: Diagnosis not present

## 2014-09-25 DIAGNOSIS — D688 Other specified coagulation defects: Secondary | ICD-10-CM | POA: Diagnosis not present

## 2014-09-25 DIAGNOSIS — N186 End stage renal disease: Secondary | ICD-10-CM | POA: Diagnosis not present

## 2014-09-26 DIAGNOSIS — F0391 Unspecified dementia with behavioral disturbance: Secondary | ICD-10-CM | POA: Diagnosis not present

## 2014-09-26 DIAGNOSIS — R531 Weakness: Secondary | ICD-10-CM | POA: Diagnosis not present

## 2014-09-26 DIAGNOSIS — Z79891 Long term (current) use of opiate analgesic: Secondary | ICD-10-CM | POA: Diagnosis not present

## 2014-09-26 DIAGNOSIS — D631 Anemia in chronic kidney disease: Secondary | ICD-10-CM | POA: Diagnosis not present

## 2014-09-26 DIAGNOSIS — E46 Unspecified protein-calorie malnutrition: Secondary | ICD-10-CM | POA: Diagnosis not present

## 2014-09-26 DIAGNOSIS — J45909 Unspecified asthma, uncomplicated: Secondary | ICD-10-CM | POA: Diagnosis not present

## 2014-09-26 DIAGNOSIS — I120 Hypertensive chronic kidney disease with stage 5 chronic kidney disease or end stage renal disease: Secondary | ICD-10-CM | POA: Diagnosis not present

## 2014-09-26 DIAGNOSIS — Z7901 Long term (current) use of anticoagulants: Secondary | ICD-10-CM | POA: Diagnosis not present

## 2014-09-26 DIAGNOSIS — I82402 Acute embolism and thrombosis of unspecified deep veins of left lower extremity: Secondary | ICD-10-CM | POA: Diagnosis not present

## 2014-09-26 DIAGNOSIS — N186 End stage renal disease: Secondary | ICD-10-CM | POA: Diagnosis not present

## 2014-09-27 DIAGNOSIS — D631 Anemia in chronic kidney disease: Secondary | ICD-10-CM | POA: Diagnosis not present

## 2014-09-27 DIAGNOSIS — N186 End stage renal disease: Secondary | ICD-10-CM | POA: Diagnosis not present

## 2014-09-27 DIAGNOSIS — D688 Other specified coagulation defects: Secondary | ICD-10-CM | POA: Diagnosis not present

## 2014-09-27 DIAGNOSIS — T8249XA Other complication of vascular dialysis catheter, initial encounter: Secondary | ICD-10-CM | POA: Diagnosis not present

## 2014-09-27 DIAGNOSIS — D509 Iron deficiency anemia, unspecified: Secondary | ICD-10-CM | POA: Diagnosis not present

## 2014-09-29 DIAGNOSIS — I120 Hypertensive chronic kidney disease with stage 5 chronic kidney disease or end stage renal disease: Secondary | ICD-10-CM | POA: Diagnosis not present

## 2014-09-29 DIAGNOSIS — F0391 Unspecified dementia with behavioral disturbance: Secondary | ICD-10-CM | POA: Diagnosis not present

## 2014-09-29 DIAGNOSIS — D631 Anemia in chronic kidney disease: Secondary | ICD-10-CM | POA: Diagnosis not present

## 2014-09-29 DIAGNOSIS — E46 Unspecified protein-calorie malnutrition: Secondary | ICD-10-CM | POA: Diagnosis not present

## 2014-09-29 DIAGNOSIS — Z7901 Long term (current) use of anticoagulants: Secondary | ICD-10-CM | POA: Diagnosis not present

## 2014-09-29 DIAGNOSIS — N186 End stage renal disease: Secondary | ICD-10-CM | POA: Diagnosis not present

## 2014-09-29 DIAGNOSIS — Z79891 Long term (current) use of opiate analgesic: Secondary | ICD-10-CM | POA: Diagnosis not present

## 2014-09-29 DIAGNOSIS — R531 Weakness: Secondary | ICD-10-CM | POA: Diagnosis not present

## 2014-09-29 DIAGNOSIS — J45909 Unspecified asthma, uncomplicated: Secondary | ICD-10-CM | POA: Diagnosis not present

## 2014-09-29 DIAGNOSIS — I82402 Acute embolism and thrombosis of unspecified deep veins of left lower extremity: Secondary | ICD-10-CM | POA: Diagnosis not present

## 2014-09-30 DIAGNOSIS — D688 Other specified coagulation defects: Secondary | ICD-10-CM | POA: Diagnosis not present

## 2014-09-30 DIAGNOSIS — D509 Iron deficiency anemia, unspecified: Secondary | ICD-10-CM | POA: Diagnosis not present

## 2014-09-30 DIAGNOSIS — N186 End stage renal disease: Secondary | ICD-10-CM | POA: Diagnosis not present

## 2014-09-30 DIAGNOSIS — T8249XA Other complication of vascular dialysis catheter, initial encounter: Secondary | ICD-10-CM | POA: Diagnosis not present

## 2014-09-30 DIAGNOSIS — D631 Anemia in chronic kidney disease: Secondary | ICD-10-CM | POA: Diagnosis not present

## 2014-10-01 DIAGNOSIS — Z79891 Long term (current) use of opiate analgesic: Secondary | ICD-10-CM | POA: Diagnosis not present

## 2014-10-01 DIAGNOSIS — E46 Unspecified protein-calorie malnutrition: Secondary | ICD-10-CM | POA: Diagnosis not present

## 2014-10-01 DIAGNOSIS — N186 End stage renal disease: Secondary | ICD-10-CM | POA: Diagnosis not present

## 2014-10-01 DIAGNOSIS — D631 Anemia in chronic kidney disease: Secondary | ICD-10-CM | POA: Diagnosis not present

## 2014-10-01 DIAGNOSIS — F0391 Unspecified dementia with behavioral disturbance: Secondary | ICD-10-CM | POA: Diagnosis not present

## 2014-10-01 DIAGNOSIS — I82402 Acute embolism and thrombosis of unspecified deep veins of left lower extremity: Secondary | ICD-10-CM | POA: Diagnosis not present

## 2014-10-01 DIAGNOSIS — J45909 Unspecified asthma, uncomplicated: Secondary | ICD-10-CM | POA: Diagnosis not present

## 2014-10-01 DIAGNOSIS — R531 Weakness: Secondary | ICD-10-CM | POA: Diagnosis not present

## 2014-10-01 DIAGNOSIS — Z7901 Long term (current) use of anticoagulants: Secondary | ICD-10-CM | POA: Diagnosis not present

## 2014-10-01 DIAGNOSIS — I120 Hypertensive chronic kidney disease with stage 5 chronic kidney disease or end stage renal disease: Secondary | ICD-10-CM | POA: Diagnosis not present

## 2014-10-02 DIAGNOSIS — T8249XA Other complication of vascular dialysis catheter, initial encounter: Secondary | ICD-10-CM | POA: Diagnosis not present

## 2014-10-02 DIAGNOSIS — D509 Iron deficiency anemia, unspecified: Secondary | ICD-10-CM | POA: Diagnosis not present

## 2014-10-02 DIAGNOSIS — D688 Other specified coagulation defects: Secondary | ICD-10-CM | POA: Diagnosis not present

## 2014-10-02 DIAGNOSIS — N186 End stage renal disease: Secondary | ICD-10-CM | POA: Diagnosis not present

## 2014-10-02 DIAGNOSIS — D631 Anemia in chronic kidney disease: Secondary | ICD-10-CM | POA: Diagnosis not present

## 2014-10-04 DIAGNOSIS — D509 Iron deficiency anemia, unspecified: Secondary | ICD-10-CM | POA: Diagnosis not present

## 2014-10-04 DIAGNOSIS — T8249XA Other complication of vascular dialysis catheter, initial encounter: Secondary | ICD-10-CM | POA: Diagnosis not present

## 2014-10-04 DIAGNOSIS — D688 Other specified coagulation defects: Secondary | ICD-10-CM | POA: Diagnosis not present

## 2014-10-04 DIAGNOSIS — N186 End stage renal disease: Secondary | ICD-10-CM | POA: Diagnosis not present

## 2014-10-04 DIAGNOSIS — D631 Anemia in chronic kidney disease: Secondary | ICD-10-CM | POA: Diagnosis not present

## 2014-10-06 DIAGNOSIS — D631 Anemia in chronic kidney disease: Secondary | ICD-10-CM | POA: Diagnosis not present

## 2014-10-06 DIAGNOSIS — Z992 Dependence on renal dialysis: Secondary | ICD-10-CM | POA: Diagnosis not present

## 2014-10-06 DIAGNOSIS — E46 Unspecified protein-calorie malnutrition: Secondary | ICD-10-CM | POA: Diagnosis not present

## 2014-10-06 DIAGNOSIS — Z7901 Long term (current) use of anticoagulants: Secondary | ICD-10-CM | POA: Diagnosis not present

## 2014-10-06 DIAGNOSIS — I871 Compression of vein: Secondary | ICD-10-CM | POA: Diagnosis not present

## 2014-10-06 DIAGNOSIS — I120 Hypertensive chronic kidney disease with stage 5 chronic kidney disease or end stage renal disease: Secondary | ICD-10-CM | POA: Diagnosis not present

## 2014-10-06 DIAGNOSIS — Z79891 Long term (current) use of opiate analgesic: Secondary | ICD-10-CM | POA: Diagnosis not present

## 2014-10-06 DIAGNOSIS — R531 Weakness: Secondary | ICD-10-CM | POA: Diagnosis not present

## 2014-10-06 DIAGNOSIS — N19 Unspecified kidney failure: Secondary | ICD-10-CM | POA: Diagnosis not present

## 2014-10-06 DIAGNOSIS — N186 End stage renal disease: Secondary | ICD-10-CM | POA: Diagnosis not present

## 2014-10-06 DIAGNOSIS — J45909 Unspecified asthma, uncomplicated: Secondary | ICD-10-CM | POA: Diagnosis not present

## 2014-10-06 DIAGNOSIS — I82402 Acute embolism and thrombosis of unspecified deep veins of left lower extremity: Secondary | ICD-10-CM | POA: Diagnosis not present

## 2014-10-06 DIAGNOSIS — T82858D Stenosis of vascular prosthetic devices, implants and grafts, subsequent encounter: Secondary | ICD-10-CM | POA: Diagnosis not present

## 2014-10-06 DIAGNOSIS — F0391 Unspecified dementia with behavioral disturbance: Secondary | ICD-10-CM | POA: Diagnosis not present

## 2014-10-07 DIAGNOSIS — N186 End stage renal disease: Secondary | ICD-10-CM | POA: Diagnosis not present

## 2014-10-07 DIAGNOSIS — D509 Iron deficiency anemia, unspecified: Secondary | ICD-10-CM | POA: Diagnosis not present

## 2014-10-07 DIAGNOSIS — D688 Other specified coagulation defects: Secondary | ICD-10-CM | POA: Diagnosis not present

## 2014-10-07 DIAGNOSIS — T8249XA Other complication of vascular dialysis catheter, initial encounter: Secondary | ICD-10-CM | POA: Diagnosis not present

## 2014-10-07 DIAGNOSIS — F0391 Unspecified dementia with behavioral disturbance: Secondary | ICD-10-CM | POA: Diagnosis not present

## 2014-10-07 DIAGNOSIS — D631 Anemia in chronic kidney disease: Secondary | ICD-10-CM | POA: Diagnosis not present

## 2014-10-08 DIAGNOSIS — I120 Hypertensive chronic kidney disease with stage 5 chronic kidney disease or end stage renal disease: Secondary | ICD-10-CM | POA: Diagnosis not present

## 2014-10-08 DIAGNOSIS — E46 Unspecified protein-calorie malnutrition: Secondary | ICD-10-CM | POA: Diagnosis not present

## 2014-10-08 DIAGNOSIS — Z7901 Long term (current) use of anticoagulants: Secondary | ICD-10-CM | POA: Diagnosis not present

## 2014-10-08 DIAGNOSIS — R531 Weakness: Secondary | ICD-10-CM | POA: Diagnosis not present

## 2014-10-08 DIAGNOSIS — I82402 Acute embolism and thrombosis of unspecified deep veins of left lower extremity: Secondary | ICD-10-CM | POA: Diagnosis not present

## 2014-10-08 DIAGNOSIS — N186 End stage renal disease: Secondary | ICD-10-CM | POA: Diagnosis not present

## 2014-10-08 DIAGNOSIS — Z79891 Long term (current) use of opiate analgesic: Secondary | ICD-10-CM | POA: Diagnosis not present

## 2014-10-08 DIAGNOSIS — F0391 Unspecified dementia with behavioral disturbance: Secondary | ICD-10-CM | POA: Diagnosis not present

## 2014-10-08 DIAGNOSIS — J45909 Unspecified asthma, uncomplicated: Secondary | ICD-10-CM | POA: Diagnosis not present

## 2014-10-08 DIAGNOSIS — D631 Anemia in chronic kidney disease: Secondary | ICD-10-CM | POA: Diagnosis not present

## 2014-10-09 DIAGNOSIS — D688 Other specified coagulation defects: Secondary | ICD-10-CM | POA: Diagnosis not present

## 2014-10-09 DIAGNOSIS — T8249XA Other complication of vascular dialysis catheter, initial encounter: Secondary | ICD-10-CM | POA: Diagnosis not present

## 2014-10-09 DIAGNOSIS — F0391 Unspecified dementia with behavioral disturbance: Secondary | ICD-10-CM | POA: Diagnosis not present

## 2014-10-09 DIAGNOSIS — D631 Anemia in chronic kidney disease: Secondary | ICD-10-CM | POA: Diagnosis not present

## 2014-10-09 DIAGNOSIS — I129 Hypertensive chronic kidney disease with stage 1 through stage 4 chronic kidney disease, or unspecified chronic kidney disease: Secondary | ICD-10-CM | POA: Diagnosis not present

## 2014-10-09 DIAGNOSIS — Z992 Dependence on renal dialysis: Secondary | ICD-10-CM | POA: Diagnosis not present

## 2014-10-09 DIAGNOSIS — N186 End stage renal disease: Secondary | ICD-10-CM | POA: Diagnosis not present

## 2014-10-09 DIAGNOSIS — D509 Iron deficiency anemia, unspecified: Secondary | ICD-10-CM | POA: Diagnosis not present

## 2014-10-10 DIAGNOSIS — F0391 Unspecified dementia with behavioral disturbance: Secondary | ICD-10-CM | POA: Diagnosis not present

## 2014-10-11 DIAGNOSIS — T8249XA Other complication of vascular dialysis catheter, initial encounter: Secondary | ICD-10-CM | POA: Diagnosis not present

## 2014-10-11 DIAGNOSIS — R634 Abnormal weight loss: Secondary | ICD-10-CM | POA: Diagnosis not present

## 2014-10-11 DIAGNOSIS — D631 Anemia in chronic kidney disease: Secondary | ICD-10-CM | POA: Diagnosis not present

## 2014-10-11 DIAGNOSIS — D509 Iron deficiency anemia, unspecified: Secondary | ICD-10-CM | POA: Diagnosis not present

## 2014-10-11 DIAGNOSIS — N186 End stage renal disease: Secondary | ICD-10-CM | POA: Diagnosis not present

## 2014-10-11 DIAGNOSIS — D688 Other specified coagulation defects: Secondary | ICD-10-CM | POA: Diagnosis not present

## 2014-10-11 DIAGNOSIS — Z23 Encounter for immunization: Secondary | ICD-10-CM | POA: Diagnosis not present

## 2014-10-11 DIAGNOSIS — F0391 Unspecified dementia with behavioral disturbance: Secondary | ICD-10-CM | POA: Diagnosis not present

## 2014-10-12 DIAGNOSIS — F0391 Unspecified dementia with behavioral disturbance: Secondary | ICD-10-CM | POA: Diagnosis not present

## 2014-10-13 DIAGNOSIS — F0391 Unspecified dementia with behavioral disturbance: Secondary | ICD-10-CM | POA: Diagnosis not present

## 2014-10-14 DIAGNOSIS — T8249XA Other complication of vascular dialysis catheter, initial encounter: Secondary | ICD-10-CM | POA: Diagnosis not present

## 2014-10-14 DIAGNOSIS — N186 End stage renal disease: Secondary | ICD-10-CM | POA: Diagnosis not present

## 2014-10-14 DIAGNOSIS — D631 Anemia in chronic kidney disease: Secondary | ICD-10-CM | POA: Diagnosis not present

## 2014-10-14 DIAGNOSIS — Z23 Encounter for immunization: Secondary | ICD-10-CM | POA: Diagnosis not present

## 2014-10-14 DIAGNOSIS — D509 Iron deficiency anemia, unspecified: Secondary | ICD-10-CM | POA: Diagnosis not present

## 2014-10-14 DIAGNOSIS — D688 Other specified coagulation defects: Secondary | ICD-10-CM | POA: Diagnosis not present

## 2014-10-14 DIAGNOSIS — R634 Abnormal weight loss: Secondary | ICD-10-CM | POA: Diagnosis not present

## 2014-10-14 DIAGNOSIS — F0391 Unspecified dementia with behavioral disturbance: Secondary | ICD-10-CM | POA: Diagnosis not present

## 2014-10-15 DIAGNOSIS — F0391 Unspecified dementia with behavioral disturbance: Secondary | ICD-10-CM | POA: Diagnosis not present

## 2014-10-16 DIAGNOSIS — Z23 Encounter for immunization: Secondary | ICD-10-CM | POA: Diagnosis not present

## 2014-10-16 DIAGNOSIS — T8249XA Other complication of vascular dialysis catheter, initial encounter: Secondary | ICD-10-CM | POA: Diagnosis not present

## 2014-10-16 DIAGNOSIS — R634 Abnormal weight loss: Secondary | ICD-10-CM | POA: Diagnosis not present

## 2014-10-16 DIAGNOSIS — D688 Other specified coagulation defects: Secondary | ICD-10-CM | POA: Diagnosis not present

## 2014-10-16 DIAGNOSIS — D509 Iron deficiency anemia, unspecified: Secondary | ICD-10-CM | POA: Diagnosis not present

## 2014-10-16 DIAGNOSIS — F0391 Unspecified dementia with behavioral disturbance: Secondary | ICD-10-CM | POA: Diagnosis not present

## 2014-10-16 DIAGNOSIS — N186 End stage renal disease: Secondary | ICD-10-CM | POA: Diagnosis not present

## 2014-10-16 DIAGNOSIS — D631 Anemia in chronic kidney disease: Secondary | ICD-10-CM | POA: Diagnosis not present

## 2014-10-17 DIAGNOSIS — R531 Weakness: Secondary | ICD-10-CM | POA: Diagnosis not present

## 2014-10-17 DIAGNOSIS — I82402 Acute embolism and thrombosis of unspecified deep veins of left lower extremity: Secondary | ICD-10-CM | POA: Diagnosis not present

## 2014-10-17 DIAGNOSIS — D631 Anemia in chronic kidney disease: Secondary | ICD-10-CM | POA: Diagnosis not present

## 2014-10-17 DIAGNOSIS — F0391 Unspecified dementia with behavioral disturbance: Secondary | ICD-10-CM | POA: Diagnosis not present

## 2014-10-17 DIAGNOSIS — N186 End stage renal disease: Secondary | ICD-10-CM | POA: Diagnosis not present

## 2014-10-17 DIAGNOSIS — E46 Unspecified protein-calorie malnutrition: Secondary | ICD-10-CM | POA: Diagnosis not present

## 2014-10-17 DIAGNOSIS — Z79891 Long term (current) use of opiate analgesic: Secondary | ICD-10-CM | POA: Diagnosis not present

## 2014-10-17 DIAGNOSIS — I120 Hypertensive chronic kidney disease with stage 5 chronic kidney disease or end stage renal disease: Secondary | ICD-10-CM | POA: Diagnosis not present

## 2014-10-17 DIAGNOSIS — J45909 Unspecified asthma, uncomplicated: Secondary | ICD-10-CM | POA: Diagnosis not present

## 2014-10-17 DIAGNOSIS — Z7901 Long term (current) use of anticoagulants: Secondary | ICD-10-CM | POA: Diagnosis not present

## 2014-10-18 DIAGNOSIS — Z23 Encounter for immunization: Secondary | ICD-10-CM | POA: Diagnosis not present

## 2014-10-18 DIAGNOSIS — D509 Iron deficiency anemia, unspecified: Secondary | ICD-10-CM | POA: Diagnosis not present

## 2014-10-18 DIAGNOSIS — F0391 Unspecified dementia with behavioral disturbance: Secondary | ICD-10-CM | POA: Diagnosis not present

## 2014-10-18 DIAGNOSIS — R634 Abnormal weight loss: Secondary | ICD-10-CM | POA: Diagnosis not present

## 2014-10-18 DIAGNOSIS — N186 End stage renal disease: Secondary | ICD-10-CM | POA: Diagnosis not present

## 2014-10-18 DIAGNOSIS — T8249XA Other complication of vascular dialysis catheter, initial encounter: Secondary | ICD-10-CM | POA: Diagnosis not present

## 2014-10-18 DIAGNOSIS — D688 Other specified coagulation defects: Secondary | ICD-10-CM | POA: Diagnosis not present

## 2014-10-18 DIAGNOSIS — D631 Anemia in chronic kidney disease: Secondary | ICD-10-CM | POA: Diagnosis not present

## 2014-10-19 DIAGNOSIS — F0391 Unspecified dementia with behavioral disturbance: Secondary | ICD-10-CM | POA: Diagnosis not present

## 2014-10-20 DIAGNOSIS — F0391 Unspecified dementia with behavioral disturbance: Secondary | ICD-10-CM | POA: Diagnosis not present

## 2014-10-21 DIAGNOSIS — T8249XA Other complication of vascular dialysis catheter, initial encounter: Secondary | ICD-10-CM | POA: Diagnosis not present

## 2014-10-21 DIAGNOSIS — Z23 Encounter for immunization: Secondary | ICD-10-CM | POA: Diagnosis not present

## 2014-10-21 DIAGNOSIS — D688 Other specified coagulation defects: Secondary | ICD-10-CM | POA: Diagnosis not present

## 2014-10-21 DIAGNOSIS — D509 Iron deficiency anemia, unspecified: Secondary | ICD-10-CM | POA: Diagnosis not present

## 2014-10-21 DIAGNOSIS — F0391 Unspecified dementia with behavioral disturbance: Secondary | ICD-10-CM | POA: Diagnosis not present

## 2014-10-21 DIAGNOSIS — R634 Abnormal weight loss: Secondary | ICD-10-CM | POA: Diagnosis not present

## 2014-10-21 DIAGNOSIS — D631 Anemia in chronic kidney disease: Secondary | ICD-10-CM | POA: Diagnosis not present

## 2014-10-21 DIAGNOSIS — N186 End stage renal disease: Secondary | ICD-10-CM | POA: Diagnosis not present

## 2014-10-22 DIAGNOSIS — J45909 Unspecified asthma, uncomplicated: Secondary | ICD-10-CM | POA: Diagnosis not present

## 2014-10-22 DIAGNOSIS — Z79891 Long term (current) use of opiate analgesic: Secondary | ICD-10-CM | POA: Diagnosis not present

## 2014-10-22 DIAGNOSIS — I82402 Acute embolism and thrombosis of unspecified deep veins of left lower extremity: Secondary | ICD-10-CM | POA: Diagnosis not present

## 2014-10-22 DIAGNOSIS — I120 Hypertensive chronic kidney disease with stage 5 chronic kidney disease or end stage renal disease: Secondary | ICD-10-CM | POA: Diagnosis not present

## 2014-10-22 DIAGNOSIS — R531 Weakness: Secondary | ICD-10-CM | POA: Diagnosis not present

## 2014-10-22 DIAGNOSIS — D631 Anemia in chronic kidney disease: Secondary | ICD-10-CM | POA: Diagnosis not present

## 2014-10-22 DIAGNOSIS — F0391 Unspecified dementia with behavioral disturbance: Secondary | ICD-10-CM | POA: Diagnosis not present

## 2014-10-22 DIAGNOSIS — N186 End stage renal disease: Secondary | ICD-10-CM | POA: Diagnosis not present

## 2014-10-22 DIAGNOSIS — E46 Unspecified protein-calorie malnutrition: Secondary | ICD-10-CM | POA: Diagnosis not present

## 2014-10-22 DIAGNOSIS — Z7901 Long term (current) use of anticoagulants: Secondary | ICD-10-CM | POA: Diagnosis not present

## 2014-10-23 DIAGNOSIS — D509 Iron deficiency anemia, unspecified: Secondary | ICD-10-CM | POA: Diagnosis not present

## 2014-10-23 DIAGNOSIS — Z23 Encounter for immunization: Secondary | ICD-10-CM | POA: Diagnosis not present

## 2014-10-23 DIAGNOSIS — D688 Other specified coagulation defects: Secondary | ICD-10-CM | POA: Diagnosis not present

## 2014-10-23 DIAGNOSIS — D631 Anemia in chronic kidney disease: Secondary | ICD-10-CM | POA: Diagnosis not present

## 2014-10-23 DIAGNOSIS — F0391 Unspecified dementia with behavioral disturbance: Secondary | ICD-10-CM | POA: Diagnosis not present

## 2014-10-23 DIAGNOSIS — R634 Abnormal weight loss: Secondary | ICD-10-CM | POA: Diagnosis not present

## 2014-10-23 DIAGNOSIS — N186 End stage renal disease: Secondary | ICD-10-CM | POA: Diagnosis not present

## 2014-10-23 DIAGNOSIS — T8249XA Other complication of vascular dialysis catheter, initial encounter: Secondary | ICD-10-CM | POA: Diagnosis not present

## 2014-10-24 DIAGNOSIS — F0391 Unspecified dementia with behavioral disturbance: Secondary | ICD-10-CM | POA: Diagnosis not present

## 2014-10-25 DIAGNOSIS — T8249XA Other complication of vascular dialysis catheter, initial encounter: Secondary | ICD-10-CM | POA: Diagnosis not present

## 2014-10-25 DIAGNOSIS — D688 Other specified coagulation defects: Secondary | ICD-10-CM | POA: Diagnosis not present

## 2014-10-25 DIAGNOSIS — Z23 Encounter for immunization: Secondary | ICD-10-CM | POA: Diagnosis not present

## 2014-10-25 DIAGNOSIS — R634 Abnormal weight loss: Secondary | ICD-10-CM | POA: Diagnosis not present

## 2014-10-25 DIAGNOSIS — D509 Iron deficiency anemia, unspecified: Secondary | ICD-10-CM | POA: Diagnosis not present

## 2014-10-25 DIAGNOSIS — F0391 Unspecified dementia with behavioral disturbance: Secondary | ICD-10-CM | POA: Diagnosis not present

## 2014-10-25 DIAGNOSIS — N186 End stage renal disease: Secondary | ICD-10-CM | POA: Diagnosis not present

## 2014-10-25 DIAGNOSIS — D631 Anemia in chronic kidney disease: Secondary | ICD-10-CM | POA: Diagnosis not present

## 2014-10-26 DIAGNOSIS — F0391 Unspecified dementia with behavioral disturbance: Secondary | ICD-10-CM | POA: Diagnosis not present

## 2014-10-28 DIAGNOSIS — R634 Abnormal weight loss: Secondary | ICD-10-CM | POA: Diagnosis not present

## 2014-10-28 DIAGNOSIS — D509 Iron deficiency anemia, unspecified: Secondary | ICD-10-CM | POA: Diagnosis not present

## 2014-10-28 DIAGNOSIS — Z23 Encounter for immunization: Secondary | ICD-10-CM | POA: Diagnosis not present

## 2014-10-28 DIAGNOSIS — D631 Anemia in chronic kidney disease: Secondary | ICD-10-CM | POA: Diagnosis not present

## 2014-10-28 DIAGNOSIS — D688 Other specified coagulation defects: Secondary | ICD-10-CM | POA: Diagnosis not present

## 2014-10-28 DIAGNOSIS — N186 End stage renal disease: Secondary | ICD-10-CM | POA: Diagnosis not present

## 2014-10-28 DIAGNOSIS — T8249XA Other complication of vascular dialysis catheter, initial encounter: Secondary | ICD-10-CM | POA: Diagnosis not present

## 2014-10-29 DIAGNOSIS — I120 Hypertensive chronic kidney disease with stage 5 chronic kidney disease or end stage renal disease: Secondary | ICD-10-CM | POA: Diagnosis not present

## 2014-10-29 DIAGNOSIS — M179 Osteoarthritis of knee, unspecified: Secondary | ICD-10-CM | POA: Diagnosis not present

## 2014-10-29 DIAGNOSIS — Z79891 Long term (current) use of opiate analgesic: Secondary | ICD-10-CM | POA: Diagnosis not present

## 2014-10-29 DIAGNOSIS — I82402 Acute embolism and thrombosis of unspecified deep veins of left lower extremity: Secondary | ICD-10-CM | POA: Diagnosis not present

## 2014-10-29 DIAGNOSIS — E46 Unspecified protein-calorie malnutrition: Secondary | ICD-10-CM | POA: Diagnosis not present

## 2014-10-29 DIAGNOSIS — N186 End stage renal disease: Secondary | ICD-10-CM | POA: Diagnosis not present

## 2014-10-29 DIAGNOSIS — Z7901 Long term (current) use of anticoagulants: Secondary | ICD-10-CM | POA: Diagnosis not present

## 2014-10-29 DIAGNOSIS — F0391 Unspecified dementia with behavioral disturbance: Secondary | ICD-10-CM | POA: Diagnosis not present

## 2014-10-29 DIAGNOSIS — D631 Anemia in chronic kidney disease: Secondary | ICD-10-CM | POA: Diagnosis not present

## 2014-10-29 DIAGNOSIS — J4520 Mild intermittent asthma, uncomplicated: Secondary | ICD-10-CM | POA: Diagnosis not present

## 2014-10-29 DIAGNOSIS — R531 Weakness: Secondary | ICD-10-CM | POA: Diagnosis not present

## 2014-10-29 DIAGNOSIS — J45909 Unspecified asthma, uncomplicated: Secondary | ICD-10-CM | POA: Diagnosis not present

## 2014-10-29 DIAGNOSIS — I10 Essential (primary) hypertension: Secondary | ICD-10-CM | POA: Diagnosis not present

## 2014-10-29 DIAGNOSIS — E784 Other hyperlipidemia: Secondary | ICD-10-CM | POA: Diagnosis not present

## 2014-10-30 DIAGNOSIS — D631 Anemia in chronic kidney disease: Secondary | ICD-10-CM | POA: Diagnosis not present

## 2014-10-30 DIAGNOSIS — N186 End stage renal disease: Secondary | ICD-10-CM | POA: Diagnosis not present

## 2014-10-30 DIAGNOSIS — D509 Iron deficiency anemia, unspecified: Secondary | ICD-10-CM | POA: Diagnosis not present

## 2014-10-30 DIAGNOSIS — T8249XA Other complication of vascular dialysis catheter, initial encounter: Secondary | ICD-10-CM | POA: Diagnosis not present

## 2014-10-30 DIAGNOSIS — D688 Other specified coagulation defects: Secondary | ICD-10-CM | POA: Diagnosis not present

## 2014-10-30 DIAGNOSIS — Z23 Encounter for immunization: Secondary | ICD-10-CM | POA: Diagnosis not present

## 2014-10-30 DIAGNOSIS — R634 Abnormal weight loss: Secondary | ICD-10-CM | POA: Diagnosis not present

## 2014-11-01 DIAGNOSIS — R634 Abnormal weight loss: Secondary | ICD-10-CM | POA: Diagnosis not present

## 2014-11-01 DIAGNOSIS — D509 Iron deficiency anemia, unspecified: Secondary | ICD-10-CM | POA: Diagnosis not present

## 2014-11-01 DIAGNOSIS — T8249XA Other complication of vascular dialysis catheter, initial encounter: Secondary | ICD-10-CM | POA: Diagnosis not present

## 2014-11-01 DIAGNOSIS — D688 Other specified coagulation defects: Secondary | ICD-10-CM | POA: Diagnosis not present

## 2014-11-01 DIAGNOSIS — D631 Anemia in chronic kidney disease: Secondary | ICD-10-CM | POA: Diagnosis not present

## 2014-11-01 DIAGNOSIS — N186 End stage renal disease: Secondary | ICD-10-CM | POA: Diagnosis not present

## 2014-11-01 DIAGNOSIS — Z23 Encounter for immunization: Secondary | ICD-10-CM | POA: Diagnosis not present

## 2014-11-03 DIAGNOSIS — Z452 Encounter for adjustment and management of vascular access device: Secondary | ICD-10-CM | POA: Diagnosis not present

## 2014-11-04 ENCOUNTER — Emergency Department (HOSPITAL_COMMUNITY)
Admission: EM | Admit: 2014-11-04 | Discharge: 2014-11-04 | Disposition: A | Discharge status: Home / Self Care | Payer: Medicare Other | Attending: Emergency Medicine | Admitting: Emergency Medicine

## 2014-11-04 ENCOUNTER — Encounter (HOSPITAL_COMMUNITY): Payer: Self-pay | Admitting: *Deleted

## 2014-11-04 DIAGNOSIS — N186 End stage renal disease: Secondary | ICD-10-CM | POA: Diagnosis not present

## 2014-11-04 DIAGNOSIS — I120 Hypertensive chronic kidney disease with stage 5 chronic kidney disease or end stage renal disease: Secondary | ICD-10-CM | POA: Insufficient documentation

## 2014-11-04 DIAGNOSIS — Z7901 Long term (current) use of anticoagulants: Secondary | ICD-10-CM | POA: Insufficient documentation

## 2014-11-04 DIAGNOSIS — D509 Iron deficiency anemia, unspecified: Secondary | ICD-10-CM | POA: Diagnosis not present

## 2014-11-04 DIAGNOSIS — Z79899 Other long term (current) drug therapy: Secondary | ICD-10-CM | POA: Diagnosis not present

## 2014-11-04 DIAGNOSIS — N185 Chronic kidney disease, stage 5: Secondary | ICD-10-CM | POA: Diagnosis not present

## 2014-11-04 DIAGNOSIS — M109 Gout, unspecified: Secondary | ICD-10-CM | POA: Diagnosis not present

## 2014-11-04 DIAGNOSIS — F0390 Unspecified dementia without behavioral disturbance: Secondary | ICD-10-CM | POA: Diagnosis not present

## 2014-11-04 DIAGNOSIS — Z23 Encounter for immunization: Secondary | ICD-10-CM | POA: Diagnosis not present

## 2014-11-04 DIAGNOSIS — Z7951 Long term (current) use of inhaled steroids: Secondary | ICD-10-CM | POA: Insufficient documentation

## 2014-11-04 DIAGNOSIS — T8249XA Other complication of vascular dialysis catheter, initial encounter: Secondary | ICD-10-CM | POA: Insufficient documentation

## 2014-11-04 DIAGNOSIS — Y828 Other medical devices associated with adverse incidents: Secondary | ICD-10-CM | POA: Insufficient documentation

## 2014-11-04 DIAGNOSIS — Z992 Dependence on renal dialysis: Secondary | ICD-10-CM | POA: Diagnosis not present

## 2014-11-04 DIAGNOSIS — T82590A Other mechanical complication of surgically created arteriovenous fistula, initial encounter: Secondary | ICD-10-CM

## 2014-11-04 DIAGNOSIS — D688 Other specified coagulation defects: Secondary | ICD-10-CM | POA: Diagnosis not present

## 2014-11-04 DIAGNOSIS — D631 Anemia in chronic kidney disease: Secondary | ICD-10-CM | POA: Diagnosis not present

## 2014-11-04 DIAGNOSIS — T82530A Leakage of surgically created arteriovenous fistula, initial encounter: Secondary | ICD-10-CM | POA: Diagnosis not present

## 2014-11-04 DIAGNOSIS — R634 Abnormal weight loss: Secondary | ICD-10-CM | POA: Diagnosis not present

## 2014-11-04 LAB — I-STAT CHEM 8, ED
BUN: 28 mg/dL — ABNORMAL HIGH (ref 6–23)
CALCIUM ION: 1.03 mmol/L — AB (ref 1.13–1.30)
Chloride: 100 mmol/L (ref 96–112)
Creatinine, Ser: 3.8 mg/dL — ABNORMAL HIGH (ref 0.50–1.10)
GLUCOSE: 94 mg/dL (ref 70–99)
HEMATOCRIT: 45 % (ref 36.0–46.0)
HEMOGLOBIN: 15.3 g/dL — AB (ref 12.0–15.0)
Potassium: 4.7 mmol/L (ref 3.5–5.1)
Sodium: 139 mmol/L (ref 135–145)
TCO2: 24 mmol/L (ref 0–100)

## 2014-11-04 LAB — PROTIME-INR
INR: 1.3 (ref 0.00–1.49)
Prothrombin Time: 16.3 seconds — ABNORMAL HIGH (ref 11.6–15.2)

## 2014-11-04 NOTE — ED Notes (Signed)
Pt verbalized understanding of d/c instructions and has no further questions.  

## 2014-11-04 NOTE — Discharge Instructions (Signed)
Your INR was a little low today at 1.3.  Call your primary care physician tomorrow to inform of this, your coumadin may need to be adjusted.  Return to the ED for new or worsening symptoms.

## 2014-11-04 NOTE — ED Notes (Addendum)
Site assessed. Bleeding controlled. Pt updated.

## 2014-11-04 NOTE — ED Provider Notes (Signed)
CSN: [id number]     Arrival date & time 11/04/14  1837 History   First MD Initiated Contact with Patient 11/04/14 2100     Chief Complaint  Patient presents with  . Vascular Access Problem     (Consider location/radiation/quality/duration/timing/severity/associated sxs/prior Treatment) The history is provided by the patient and medical records.    81 year-old female with chronic kidney disease currently on hemodialysis, hypertension, hyperlipidemia, dementia, presenting to the ED for bleeding from her fistula site. Patient finished dialysis at 1700 today and went home. She states initially there was no bleeding that she had a small amount of bleeding around 1800.  States she generally does not have issues with recurrent bleeding so she became concerned. She currently takes coumadin. She denies any arm pain, numbness, or weakness.  Past Medical History  Diagnosis Date  . Gout   . CKD (chronic kidney disease) stage V requiring chronic dialysis   . Hypertension   . Hyperlipemia   . Dementia    Past Surgical History  Procedure Laterality Date  . Tubal ligation    . Av fistula placement     Family History  Problem Relation Age of Onset  . Colon cancer Neg Hx   . Diabetes Mellitus II Neg Hx    History  Substance Use Topics  . Smoking status: Never Smoker   . Smokeless tobacco: Never Used  . Alcohol Use: No   OB History    No data available     Review of Systems  Hematological:       Dialysis fistula bleeding  All other systems reviewed and are negative.     Allergies  Nsaids  Home Medications   Prior to Admission medications   Medication Sig Start Date End Date Taking? Authorizing Provider  albuterol (PROAIR HFA) 108 (90 BASE) MCG/ACT inhaler Inhale 2 puffs into the lungs every 6 (six) hours as needed for wheezing or shortness of breath.     Historical Provider, MD  albuterol (PROVENTIL) (2.5 MG/3ML) 0.083% nebulizer solution Take 3 mLs by nebulization daily as  needed. 07/07/14   Historical Provider, MD  cinacalcet (SENSIPAR) 30 MG tablet Take 1 tablet (30 mg total) by mouth daily with breakfast. 06/09/14   Stephani PoliceMarianne L York, PA-C  feeding supplement, RESOURCE BREEZE, (RESOURCE BREEZE) LIQD Take 1 Container by mouth 2 (two) times daily between meals. 08/14/14   Richarda OverlieNayana Abrol, MD  FLOVENT DISKUS 100 MCG/BLIST AEPB Inhale 2 puffs into the lungs daily as needed. 05/19/14   Historical Provider, MD  guaifenesin (ROBITUSSIN) 100 MG/5ML syrup Take 10 mLs (200 mg total) by mouth every 4 (four) hours while awake. Patient not taking: Reported on 08/10/2014 07/09/14   Marinda ElkAbraham Feliz Ortiz, MD  HYDROcodone-acetaminophen (NORCO/VICODIN) 5-325 MG per tablet Take 1-2 tablets by mouth every 4 (four) hours as needed for moderate pain or severe pain. Patient taking differently: Take 1 tablet by mouth every 4 (four) hours as needed for moderate pain or severe pain.  05/10/14   Marisa Severinlga Otter, MD  LORazepam (ATIVAN) 0.5 MG tablet Take 1 tablet (0.5 mg total) by mouth every 12 (twelve) hours as needed for anxiety. Also-may give 1 hour prior to dialysis 06/23/14   Rhetta MuraJai-Gurmukh Samtani, MD  midodrine (PROAMATINE) 10 MG tablet Take 1 tablet (10 mg total) by mouth 3 (three) times daily with meals. 07/09/14   Marinda ElkAbraham Feliz Ortiz, MD  multivitamin (RENA-VIT) TABS tablet Take 1 tablet by mouth at bedtime.     Historical Provider, MD  nitroGLYCERIN (  NITROSTAT) 0.4 MG SL tablet Place 0.4 mg under the tongue every 5 (five) minutes as needed for chest pain.    Historical Provider, MD  Nutritional Supplements (FEEDING SUPPLEMENT, NEPRO CARB STEADY,) LIQD Take 237 mLs by mouth 2 (two) times daily between meals. Patient taking differently: Take 237 mLs by mouth daily as needed (feeding supplement).  06/09/14   Stephani Police, PA-C  traMADol (ULTRAM) 50 MG tablet Take 1 tablet (50 mg total) by mouth every 6 (six) hours as needed for moderate pain. Patient not taking: Reported on 08/10/2014 06/23/14    Rhetta Mura, MD  warfarin (COUMADIN) 4 MG tablet Take 1 tablet (4 mg total) by mouth every evening. 07/11/14   [name], MD   BP 90/48 mmHg  Pulse 86  Temp(Src) 98.4 F (36.9 C) (Oral)  Resp 20  Ht  (1.575 m)  Wt 121 lb (54.885 kg)  BMI 22.13 kg/m2  SpO2 97%   Physical Exam  Constitutional: She is oriented to person, place, and time. She appears well-developed and well-nourished.  HENT:  Head: Normocephalic and atraumatic.  Mouth/Throat: Oropharynx is clear and moist.  Eyes: Conjunctivae and EOM are normal. Pupils are equal, round, and reactive to light.  Neck: Normal range of motion.  Cardiovascular: Normal rate, regular rhythm and normal heart sounds.   Pulmonary/Chest: Effort normal and breath sounds normal.  Abdominal: Soft. Bowel sounds are normal.  Musculoskeletal: Normal range of motion.  AV fistula left upper arm, no active bleeding, strong thrill present  Neurological: She is alert and oriented to person, place, and time.  Skin: Skin is warm and dry.  Psychiatric: She has a normal mood and affect.  Nursing note and vitals reviewed.   ED Course  Procedures (including critical care time) Labs Review Labs Reviewed  PROTIME-INR - Abnormal; Notable for the following:    Prothrombin Time 16.3 (*)    All other components within normal limits  I-STAT CHEM 8, ED - Abnormal; Notable for the following:    BUN 28 (*)    Creatinine, Ser 3.80 (*)    Calcium, Ion 1.03 (*)    Hemoglobin 15.3 (*)    All other components within normal limits    Imaging Review No results found.   EKG Interpretation None      MDM   Final diagnoses:  Dialysis AV fistula malfunction, initial encounter   81 year old female with bleeding from dialysis fistula site.  She has been in the waiting room for approximately 3 hours without recurrence of bleeding. Dressing was removed, site appears clean, there is no active bleeding or signs of infection. Patient has no  complaints at this time.  Given she is on coumadin, will send INR to ensure not supra-therapeutic and check H/H.  Patient has been observed for an additional 45 minutes without recurrence of bleeding. Her INR is somewhat low at 1.3, her Coumadin has recently been adjusted. Her H&H is stable. Will discharge home at this time. Patient is to contact her PCP regarding INR and medication adjustment.  Discussed plan with patient, he/she acknowledged understanding and agreed with plan of care.  Return precautions given for new or worsening symptoms.  Garlon Hatchet, PA-C 11/04/14 2232  Doug Sou, MD 11/05/14 [phone]

## 2014-11-04 NOTE — ED Provider Notes (Signed)
Level V caveat dementia history is obtained from patient's daughter and from patient Patient presently asymptomatic. Had bleeding from her dialysis fistula today after dialysis with which has since stopped. Patient is alert no distress. Heart regular rate and rhythm with a grade 2/6 systolic murmur lungs clear auscultation left upper extremity with dialysis fistula with good thrill. No hematoma present  Doug SouSam Derrius Furtick, MD 11/04/14 2212

## 2014-11-04 NOTE — ED Notes (Signed)
Pt states that she finished dialysis at 5pm. Pt states that she has not changed the bandage since dialysis. Some blood noted to dressing. No new blood noted. Thrill and bruit present in left arm.

## 2014-11-06 DIAGNOSIS — D631 Anemia in chronic kidney disease: Secondary | ICD-10-CM | POA: Diagnosis not present

## 2014-11-06 DIAGNOSIS — T8249XA Other complication of vascular dialysis catheter, initial encounter: Secondary | ICD-10-CM | POA: Diagnosis not present

## 2014-11-06 DIAGNOSIS — D509 Iron deficiency anemia, unspecified: Secondary | ICD-10-CM | POA: Diagnosis not present

## 2014-11-06 DIAGNOSIS — N19 Unspecified kidney failure: Secondary | ICD-10-CM | POA: Diagnosis not present

## 2014-11-06 DIAGNOSIS — N186 End stage renal disease: Secondary | ICD-10-CM | POA: Diagnosis not present

## 2014-11-06 DIAGNOSIS — D688 Other specified coagulation defects: Secondary | ICD-10-CM | POA: Diagnosis not present

## 2014-11-06 DIAGNOSIS — R634 Abnormal weight loss: Secondary | ICD-10-CM | POA: Diagnosis not present

## 2014-11-06 DIAGNOSIS — Z23 Encounter for immunization: Secondary | ICD-10-CM | POA: Diagnosis not present

## 2014-11-08 DIAGNOSIS — Z992 Dependence on renal dialysis: Secondary | ICD-10-CM | POA: Diagnosis not present

## 2014-11-08 DIAGNOSIS — T8249XA Other complication of vascular dialysis catheter, initial encounter: Secondary | ICD-10-CM | POA: Diagnosis not present

## 2014-11-08 DIAGNOSIS — N186 End stage renal disease: Secondary | ICD-10-CM | POA: Diagnosis not present

## 2014-11-08 DIAGNOSIS — D509 Iron deficiency anemia, unspecified: Secondary | ICD-10-CM | POA: Diagnosis not present

## 2014-11-08 DIAGNOSIS — I129 Hypertensive chronic kidney disease with stage 1 through stage 4 chronic kidney disease, or unspecified chronic kidney disease: Secondary | ICD-10-CM | POA: Diagnosis not present

## 2014-11-08 DIAGNOSIS — Z23 Encounter for immunization: Secondary | ICD-10-CM | POA: Diagnosis not present

## 2014-11-08 DIAGNOSIS — D688 Other specified coagulation defects: Secondary | ICD-10-CM | POA: Diagnosis not present

## 2014-11-08 DIAGNOSIS — R634 Abnormal weight loss: Secondary | ICD-10-CM | POA: Diagnosis not present

## 2014-11-08 DIAGNOSIS — D631 Anemia in chronic kidney disease: Secondary | ICD-10-CM | POA: Diagnosis not present

## 2014-11-09 DIAGNOSIS — F0391 Unspecified dementia with behavioral disturbance: Secondary | ICD-10-CM | POA: Diagnosis not present

## 2014-11-10 DIAGNOSIS — F0391 Unspecified dementia with behavioral disturbance: Secondary | ICD-10-CM | POA: Diagnosis not present

## 2014-11-11 DIAGNOSIS — D631 Anemia in chronic kidney disease: Secondary | ICD-10-CM | POA: Diagnosis not present

## 2014-11-11 DIAGNOSIS — F0391 Unspecified dementia with behavioral disturbance: Secondary | ICD-10-CM | POA: Diagnosis not present

## 2014-11-11 DIAGNOSIS — R634 Abnormal weight loss: Secondary | ICD-10-CM | POA: Diagnosis not present

## 2014-11-11 DIAGNOSIS — N2581 Secondary hyperparathyroidism of renal origin: Secondary | ICD-10-CM | POA: Diagnosis not present

## 2014-11-11 DIAGNOSIS — D509 Iron deficiency anemia, unspecified: Secondary | ICD-10-CM | POA: Diagnosis not present

## 2014-11-11 DIAGNOSIS — D688 Other specified coagulation defects: Secondary | ICD-10-CM | POA: Diagnosis not present

## 2014-11-11 DIAGNOSIS — N186 End stage renal disease: Secondary | ICD-10-CM | POA: Diagnosis not present

## 2014-11-11 DIAGNOSIS — Z23 Encounter for immunization: Secondary | ICD-10-CM | POA: Diagnosis not present

## 2014-11-12 DIAGNOSIS — F0391 Unspecified dementia with behavioral disturbance: Secondary | ICD-10-CM | POA: Diagnosis not present

## 2014-11-13 DIAGNOSIS — D509 Iron deficiency anemia, unspecified: Secondary | ICD-10-CM | POA: Diagnosis not present

## 2014-11-13 DIAGNOSIS — R634 Abnormal weight loss: Secondary | ICD-10-CM | POA: Diagnosis not present

## 2014-11-13 DIAGNOSIS — N186 End stage renal disease: Secondary | ICD-10-CM | POA: Diagnosis not present

## 2014-11-13 DIAGNOSIS — D688 Other specified coagulation defects: Secondary | ICD-10-CM | POA: Diagnosis not present

## 2014-11-13 DIAGNOSIS — D631 Anemia in chronic kidney disease: Secondary | ICD-10-CM | POA: Diagnosis not present

## 2014-11-13 DIAGNOSIS — N2581 Secondary hyperparathyroidism of renal origin: Secondary | ICD-10-CM | POA: Diagnosis not present

## 2014-11-13 DIAGNOSIS — Z23 Encounter for immunization: Secondary | ICD-10-CM | POA: Diagnosis not present

## 2014-11-14 DIAGNOSIS — F0391 Unspecified dementia with behavioral disturbance: Secondary | ICD-10-CM | POA: Diagnosis not present

## 2014-11-15 DIAGNOSIS — N186 End stage renal disease: Secondary | ICD-10-CM | POA: Diagnosis not present

## 2014-11-15 DIAGNOSIS — F0391 Unspecified dementia with behavioral disturbance: Secondary | ICD-10-CM | POA: Diagnosis not present

## 2014-11-15 DIAGNOSIS — D509 Iron deficiency anemia, unspecified: Secondary | ICD-10-CM | POA: Diagnosis not present

## 2014-11-15 DIAGNOSIS — Z23 Encounter for immunization: Secondary | ICD-10-CM | POA: Diagnosis not present

## 2014-11-15 DIAGNOSIS — D688 Other specified coagulation defects: Secondary | ICD-10-CM | POA: Diagnosis not present

## 2014-11-15 DIAGNOSIS — N2581 Secondary hyperparathyroidism of renal origin: Secondary | ICD-10-CM | POA: Diagnosis not present

## 2014-11-15 DIAGNOSIS — D631 Anemia in chronic kidney disease: Secondary | ICD-10-CM | POA: Diagnosis not present

## 2014-11-15 DIAGNOSIS — R634 Abnormal weight loss: Secondary | ICD-10-CM | POA: Diagnosis not present

## 2014-11-18 DIAGNOSIS — N186 End stage renal disease: Secondary | ICD-10-CM | POA: Diagnosis not present

## 2014-11-18 DIAGNOSIS — Z23 Encounter for immunization: Secondary | ICD-10-CM | POA: Diagnosis not present

## 2014-11-18 DIAGNOSIS — D631 Anemia in chronic kidney disease: Secondary | ICD-10-CM | POA: Diagnosis not present

## 2014-11-18 DIAGNOSIS — D509 Iron deficiency anemia, unspecified: Secondary | ICD-10-CM | POA: Diagnosis not present

## 2014-11-18 DIAGNOSIS — D688 Other specified coagulation defects: Secondary | ICD-10-CM | POA: Diagnosis not present

## 2014-11-18 DIAGNOSIS — R634 Abnormal weight loss: Secondary | ICD-10-CM | POA: Diagnosis not present

## 2014-11-18 DIAGNOSIS — N2581 Secondary hyperparathyroidism of renal origin: Secondary | ICD-10-CM | POA: Diagnosis not present

## 2014-11-20 DIAGNOSIS — N186 End stage renal disease: Secondary | ICD-10-CM | POA: Diagnosis not present

## 2014-11-20 DIAGNOSIS — D631 Anemia in chronic kidney disease: Secondary | ICD-10-CM | POA: Diagnosis not present

## 2014-11-20 DIAGNOSIS — N2581 Secondary hyperparathyroidism of renal origin: Secondary | ICD-10-CM | POA: Diagnosis not present

## 2014-11-20 DIAGNOSIS — R634 Abnormal weight loss: Secondary | ICD-10-CM | POA: Diagnosis not present

## 2014-11-20 DIAGNOSIS — D688 Other specified coagulation defects: Secondary | ICD-10-CM | POA: Diagnosis not present

## 2014-11-20 DIAGNOSIS — Z23 Encounter for immunization: Secondary | ICD-10-CM | POA: Diagnosis not present

## 2014-11-20 DIAGNOSIS — D509 Iron deficiency anemia, unspecified: Secondary | ICD-10-CM | POA: Diagnosis not present

## 2014-11-22 DIAGNOSIS — D631 Anemia in chronic kidney disease: Secondary | ICD-10-CM | POA: Diagnosis not present

## 2014-11-22 DIAGNOSIS — R634 Abnormal weight loss: Secondary | ICD-10-CM | POA: Diagnosis not present

## 2014-11-22 DIAGNOSIS — D509 Iron deficiency anemia, unspecified: Secondary | ICD-10-CM | POA: Diagnosis not present

## 2014-11-22 DIAGNOSIS — N2581 Secondary hyperparathyroidism of renal origin: Secondary | ICD-10-CM | POA: Diagnosis not present

## 2014-11-22 DIAGNOSIS — Z23 Encounter for immunization: Secondary | ICD-10-CM | POA: Diagnosis not present

## 2014-11-22 DIAGNOSIS — D688 Other specified coagulation defects: Secondary | ICD-10-CM | POA: Diagnosis not present

## 2014-11-22 DIAGNOSIS — N186 End stage renal disease: Secondary | ICD-10-CM | POA: Diagnosis not present

## 2014-11-23 DIAGNOSIS — F0391 Unspecified dementia with behavioral disturbance: Secondary | ICD-10-CM | POA: Diagnosis not present

## 2014-11-24 DIAGNOSIS — F0391 Unspecified dementia with behavioral disturbance: Secondary | ICD-10-CM | POA: Diagnosis not present

## 2014-11-25 DIAGNOSIS — D631 Anemia in chronic kidney disease: Secondary | ICD-10-CM | POA: Diagnosis not present

## 2014-11-25 DIAGNOSIS — D509 Iron deficiency anemia, unspecified: Secondary | ICD-10-CM | POA: Diagnosis not present

## 2014-11-25 DIAGNOSIS — D688 Other specified coagulation defects: Secondary | ICD-10-CM | POA: Diagnosis not present

## 2014-11-25 DIAGNOSIS — F0391 Unspecified dementia with behavioral disturbance: Secondary | ICD-10-CM | POA: Diagnosis not present

## 2014-11-25 DIAGNOSIS — N2581 Secondary hyperparathyroidism of renal origin: Secondary | ICD-10-CM | POA: Diagnosis not present

## 2014-11-25 DIAGNOSIS — R634 Abnormal weight loss: Secondary | ICD-10-CM | POA: Diagnosis not present

## 2014-11-25 DIAGNOSIS — Z23 Encounter for immunization: Secondary | ICD-10-CM | POA: Diagnosis not present

## 2014-11-25 DIAGNOSIS — N186 End stage renal disease: Secondary | ICD-10-CM | POA: Diagnosis not present

## 2014-11-27 DIAGNOSIS — D688 Other specified coagulation defects: Secondary | ICD-10-CM | POA: Diagnosis not present

## 2014-11-27 DIAGNOSIS — N186 End stage renal disease: Secondary | ICD-10-CM | POA: Diagnosis not present

## 2014-11-27 DIAGNOSIS — D509 Iron deficiency anemia, unspecified: Secondary | ICD-10-CM | POA: Diagnosis not present

## 2014-11-27 DIAGNOSIS — N2581 Secondary hyperparathyroidism of renal origin: Secondary | ICD-10-CM | POA: Diagnosis not present

## 2014-11-27 DIAGNOSIS — D631 Anemia in chronic kidney disease: Secondary | ICD-10-CM | POA: Diagnosis not present

## 2014-11-27 DIAGNOSIS — R634 Abnormal weight loss: Secondary | ICD-10-CM | POA: Diagnosis not present

## 2014-11-27 DIAGNOSIS — F0391 Unspecified dementia with behavioral disturbance: Secondary | ICD-10-CM | POA: Diagnosis not present

## 2014-11-27 DIAGNOSIS — Z23 Encounter for immunization: Secondary | ICD-10-CM | POA: Diagnosis not present

## 2014-11-29 DIAGNOSIS — D688 Other specified coagulation defects: Secondary | ICD-10-CM | POA: Diagnosis not present

## 2014-11-29 DIAGNOSIS — Z23 Encounter for immunization: Secondary | ICD-10-CM | POA: Diagnosis not present

## 2014-11-29 DIAGNOSIS — N186 End stage renal disease: Secondary | ICD-10-CM | POA: Diagnosis not present

## 2014-11-29 DIAGNOSIS — D509 Iron deficiency anemia, unspecified: Secondary | ICD-10-CM | POA: Diagnosis not present

## 2014-11-29 DIAGNOSIS — N2581 Secondary hyperparathyroidism of renal origin: Secondary | ICD-10-CM | POA: Diagnosis not present

## 2014-11-29 DIAGNOSIS — R634 Abnormal weight loss: Secondary | ICD-10-CM | POA: Diagnosis not present

## 2014-11-29 DIAGNOSIS — D631 Anemia in chronic kidney disease: Secondary | ICD-10-CM | POA: Diagnosis not present

## 2014-12-02 DIAGNOSIS — R634 Abnormal weight loss: Secondary | ICD-10-CM | POA: Diagnosis not present

## 2014-12-02 DIAGNOSIS — D631 Anemia in chronic kidney disease: Secondary | ICD-10-CM | POA: Diagnosis not present

## 2014-12-02 DIAGNOSIS — N186 End stage renal disease: Secondary | ICD-10-CM | POA: Diagnosis not present

## 2014-12-02 DIAGNOSIS — D509 Iron deficiency anemia, unspecified: Secondary | ICD-10-CM | POA: Diagnosis not present

## 2014-12-02 DIAGNOSIS — N2581 Secondary hyperparathyroidism of renal origin: Secondary | ICD-10-CM | POA: Diagnosis not present

## 2014-12-02 DIAGNOSIS — Z23 Encounter for immunization: Secondary | ICD-10-CM | POA: Diagnosis not present

## 2014-12-02 DIAGNOSIS — D688 Other specified coagulation defects: Secondary | ICD-10-CM | POA: Diagnosis not present

## 2014-12-04 DIAGNOSIS — Z23 Encounter for immunization: Secondary | ICD-10-CM | POA: Diagnosis not present

## 2014-12-04 DIAGNOSIS — D688 Other specified coagulation defects: Secondary | ICD-10-CM | POA: Diagnosis not present

## 2014-12-04 DIAGNOSIS — N186 End stage renal disease: Secondary | ICD-10-CM | POA: Diagnosis not present

## 2014-12-04 DIAGNOSIS — N2581 Secondary hyperparathyroidism of renal origin: Secondary | ICD-10-CM | POA: Diagnosis not present

## 2014-12-04 DIAGNOSIS — R634 Abnormal weight loss: Secondary | ICD-10-CM | POA: Diagnosis not present

## 2014-12-04 DIAGNOSIS — D509 Iron deficiency anemia, unspecified: Secondary | ICD-10-CM | POA: Diagnosis not present

## 2014-12-04 DIAGNOSIS — D631 Anemia in chronic kidney disease: Secondary | ICD-10-CM | POA: Diagnosis not present

## 2014-12-06 DIAGNOSIS — Z23 Encounter for immunization: Secondary | ICD-10-CM | POA: Diagnosis not present

## 2014-12-06 DIAGNOSIS — D688 Other specified coagulation defects: Secondary | ICD-10-CM | POA: Diagnosis not present

## 2014-12-06 DIAGNOSIS — D631 Anemia in chronic kidney disease: Secondary | ICD-10-CM | POA: Diagnosis not present

## 2014-12-06 DIAGNOSIS — R634 Abnormal weight loss: Secondary | ICD-10-CM | POA: Diagnosis not present

## 2014-12-06 DIAGNOSIS — N186 End stage renal disease: Secondary | ICD-10-CM | POA: Diagnosis not present

## 2014-12-06 DIAGNOSIS — D509 Iron deficiency anemia, unspecified: Secondary | ICD-10-CM | POA: Diagnosis not present

## 2014-12-06 DIAGNOSIS — N19 Unspecified kidney failure: Secondary | ICD-10-CM | POA: Diagnosis not present

## 2014-12-06 DIAGNOSIS — N2581 Secondary hyperparathyroidism of renal origin: Secondary | ICD-10-CM | POA: Diagnosis not present

## 2014-12-09 DIAGNOSIS — D509 Iron deficiency anemia, unspecified: Secondary | ICD-10-CM | POA: Diagnosis not present

## 2014-12-09 DIAGNOSIS — Z992 Dependence on renal dialysis: Secondary | ICD-10-CM | POA: Diagnosis not present

## 2014-12-09 DIAGNOSIS — D688 Other specified coagulation defects: Secondary | ICD-10-CM | POA: Diagnosis not present

## 2014-12-09 DIAGNOSIS — I129 Hypertensive chronic kidney disease with stage 1 through stage 4 chronic kidney disease, or unspecified chronic kidney disease: Secondary | ICD-10-CM | POA: Diagnosis not present

## 2014-12-09 DIAGNOSIS — N186 End stage renal disease: Secondary | ICD-10-CM | POA: Diagnosis not present

## 2014-12-09 DIAGNOSIS — Z23 Encounter for immunization: Secondary | ICD-10-CM | POA: Diagnosis not present

## 2014-12-09 DIAGNOSIS — N2581 Secondary hyperparathyroidism of renal origin: Secondary | ICD-10-CM | POA: Diagnosis not present

## 2014-12-09 DIAGNOSIS — D631 Anemia in chronic kidney disease: Secondary | ICD-10-CM | POA: Diagnosis not present

## 2014-12-09 DIAGNOSIS — R634 Abnormal weight loss: Secondary | ICD-10-CM | POA: Diagnosis not present

## 2014-12-11 DIAGNOSIS — N2581 Secondary hyperparathyroidism of renal origin: Secondary | ICD-10-CM | POA: Diagnosis not present

## 2014-12-11 DIAGNOSIS — D509 Iron deficiency anemia, unspecified: Secondary | ICD-10-CM | POA: Diagnosis not present

## 2014-12-11 DIAGNOSIS — Z23 Encounter for immunization: Secondary | ICD-10-CM | POA: Diagnosis not present

## 2014-12-11 DIAGNOSIS — D631 Anemia in chronic kidney disease: Secondary | ICD-10-CM | POA: Diagnosis not present

## 2014-12-11 DIAGNOSIS — D688 Other specified coagulation defects: Secondary | ICD-10-CM | POA: Diagnosis not present

## 2014-12-11 DIAGNOSIS — N186 End stage renal disease: Secondary | ICD-10-CM | POA: Diagnosis not present

## 2014-12-11 DIAGNOSIS — R51 Headache: Secondary | ICD-10-CM | POA: Diagnosis not present

## 2014-12-13 DIAGNOSIS — R51 Headache: Secondary | ICD-10-CM | POA: Diagnosis not present

## 2014-12-13 DIAGNOSIS — N2581 Secondary hyperparathyroidism of renal origin: Secondary | ICD-10-CM | POA: Diagnosis not present

## 2014-12-13 DIAGNOSIS — D688 Other specified coagulation defects: Secondary | ICD-10-CM | POA: Diagnosis not present

## 2014-12-13 DIAGNOSIS — D631 Anemia in chronic kidney disease: Secondary | ICD-10-CM | POA: Diagnosis not present

## 2014-12-13 DIAGNOSIS — D509 Iron deficiency anemia, unspecified: Secondary | ICD-10-CM | POA: Diagnosis not present

## 2014-12-13 DIAGNOSIS — Z23 Encounter for immunization: Secondary | ICD-10-CM | POA: Diagnosis not present

## 2014-12-13 DIAGNOSIS — N186 End stage renal disease: Secondary | ICD-10-CM | POA: Diagnosis not present

## 2014-12-16 DIAGNOSIS — D688 Other specified coagulation defects: Secondary | ICD-10-CM | POA: Diagnosis not present

## 2014-12-16 DIAGNOSIS — N2581 Secondary hyperparathyroidism of renal origin: Secondary | ICD-10-CM | POA: Diagnosis not present

## 2014-12-16 DIAGNOSIS — D631 Anemia in chronic kidney disease: Secondary | ICD-10-CM | POA: Diagnosis not present

## 2014-12-16 DIAGNOSIS — Z23 Encounter for immunization: Secondary | ICD-10-CM | POA: Diagnosis not present

## 2014-12-16 DIAGNOSIS — D509 Iron deficiency anemia, unspecified: Secondary | ICD-10-CM | POA: Diagnosis not present

## 2014-12-16 DIAGNOSIS — R51 Headache: Secondary | ICD-10-CM | POA: Diagnosis not present

## 2014-12-16 DIAGNOSIS — N186 End stage renal disease: Secondary | ICD-10-CM | POA: Diagnosis not present

## 2014-12-18 DIAGNOSIS — N2581 Secondary hyperparathyroidism of renal origin: Secondary | ICD-10-CM | POA: Diagnosis not present

## 2014-12-18 DIAGNOSIS — D688 Other specified coagulation defects: Secondary | ICD-10-CM | POA: Diagnosis not present

## 2014-12-18 DIAGNOSIS — D509 Iron deficiency anemia, unspecified: Secondary | ICD-10-CM | POA: Diagnosis not present

## 2014-12-18 DIAGNOSIS — D631 Anemia in chronic kidney disease: Secondary | ICD-10-CM | POA: Diagnosis not present

## 2014-12-18 DIAGNOSIS — N186 End stage renal disease: Secondary | ICD-10-CM | POA: Diagnosis not present

## 2014-12-18 DIAGNOSIS — R51 Headache: Secondary | ICD-10-CM | POA: Diagnosis not present

## 2014-12-18 DIAGNOSIS — Z23 Encounter for immunization: Secondary | ICD-10-CM | POA: Diagnosis not present

## 2014-12-20 DIAGNOSIS — Z23 Encounter for immunization: Secondary | ICD-10-CM | POA: Diagnosis not present

## 2014-12-20 DIAGNOSIS — D509 Iron deficiency anemia, unspecified: Secondary | ICD-10-CM | POA: Diagnosis not present

## 2014-12-20 DIAGNOSIS — N2581 Secondary hyperparathyroidism of renal origin: Secondary | ICD-10-CM | POA: Diagnosis not present

## 2014-12-20 DIAGNOSIS — D631 Anemia in chronic kidney disease: Secondary | ICD-10-CM | POA: Diagnosis not present

## 2014-12-20 DIAGNOSIS — R51 Headache: Secondary | ICD-10-CM | POA: Diagnosis not present

## 2014-12-20 DIAGNOSIS — D688 Other specified coagulation defects: Secondary | ICD-10-CM | POA: Diagnosis not present

## 2014-12-20 DIAGNOSIS — N186 End stage renal disease: Secondary | ICD-10-CM | POA: Diagnosis not present

## 2014-12-23 DIAGNOSIS — D688 Other specified coagulation defects: Secondary | ICD-10-CM | POA: Diagnosis not present

## 2014-12-23 DIAGNOSIS — N186 End stage renal disease: Secondary | ICD-10-CM | POA: Diagnosis not present

## 2014-12-23 DIAGNOSIS — R51 Headache: Secondary | ICD-10-CM | POA: Diagnosis not present

## 2014-12-23 DIAGNOSIS — D509 Iron deficiency anemia, unspecified: Secondary | ICD-10-CM | POA: Diagnosis not present

## 2014-12-23 DIAGNOSIS — Z23 Encounter for immunization: Secondary | ICD-10-CM | POA: Diagnosis not present

## 2014-12-23 DIAGNOSIS — D631 Anemia in chronic kidney disease: Secondary | ICD-10-CM | POA: Diagnosis not present

## 2014-12-23 DIAGNOSIS — N2581 Secondary hyperparathyroidism of renal origin: Secondary | ICD-10-CM | POA: Diagnosis not present

## 2014-12-25 DIAGNOSIS — D688 Other specified coagulation defects: Secondary | ICD-10-CM | POA: Diagnosis not present

## 2014-12-25 DIAGNOSIS — N186 End stage renal disease: Secondary | ICD-10-CM | POA: Diagnosis not present

## 2014-12-25 DIAGNOSIS — N2581 Secondary hyperparathyroidism of renal origin: Secondary | ICD-10-CM | POA: Diagnosis not present

## 2014-12-25 DIAGNOSIS — Z23 Encounter for immunization: Secondary | ICD-10-CM | POA: Diagnosis not present

## 2014-12-25 DIAGNOSIS — D631 Anemia in chronic kidney disease: Secondary | ICD-10-CM | POA: Diagnosis not present

## 2014-12-25 DIAGNOSIS — D509 Iron deficiency anemia, unspecified: Secondary | ICD-10-CM | POA: Diagnosis not present

## 2014-12-25 DIAGNOSIS — R51 Headache: Secondary | ICD-10-CM | POA: Diagnosis not present

## 2014-12-27 DIAGNOSIS — D688 Other specified coagulation defects: Secondary | ICD-10-CM | POA: Diagnosis not present

## 2014-12-27 DIAGNOSIS — D509 Iron deficiency anemia, unspecified: Secondary | ICD-10-CM | POA: Diagnosis not present

## 2014-12-27 DIAGNOSIS — N2581 Secondary hyperparathyroidism of renal origin: Secondary | ICD-10-CM | POA: Diagnosis not present

## 2014-12-27 DIAGNOSIS — R51 Headache: Secondary | ICD-10-CM | POA: Diagnosis not present

## 2014-12-27 DIAGNOSIS — Z23 Encounter for immunization: Secondary | ICD-10-CM | POA: Diagnosis not present

## 2014-12-27 DIAGNOSIS — D631 Anemia in chronic kidney disease: Secondary | ICD-10-CM | POA: Diagnosis not present

## 2014-12-27 DIAGNOSIS — N186 End stage renal disease: Secondary | ICD-10-CM | POA: Diagnosis not present

## 2014-12-30 DIAGNOSIS — R51 Headache: Secondary | ICD-10-CM | POA: Diagnosis not present

## 2014-12-30 DIAGNOSIS — D688 Other specified coagulation defects: Secondary | ICD-10-CM | POA: Diagnosis not present

## 2014-12-30 DIAGNOSIS — N2581 Secondary hyperparathyroidism of renal origin: Secondary | ICD-10-CM | POA: Diagnosis not present

## 2014-12-30 DIAGNOSIS — D509 Iron deficiency anemia, unspecified: Secondary | ICD-10-CM | POA: Diagnosis not present

## 2014-12-30 DIAGNOSIS — Z23 Encounter for immunization: Secondary | ICD-10-CM | POA: Diagnosis not present

## 2014-12-30 DIAGNOSIS — D631 Anemia in chronic kidney disease: Secondary | ICD-10-CM | POA: Diagnosis not present

## 2014-12-30 DIAGNOSIS — N186 End stage renal disease: Secondary | ICD-10-CM | POA: Diagnosis not present

## 2015-01-01 DIAGNOSIS — D631 Anemia in chronic kidney disease: Secondary | ICD-10-CM | POA: Diagnosis not present

## 2015-01-01 DIAGNOSIS — Z23 Encounter for immunization: Secondary | ICD-10-CM | POA: Diagnosis not present

## 2015-01-01 DIAGNOSIS — D688 Other specified coagulation defects: Secondary | ICD-10-CM | POA: Diagnosis not present

## 2015-01-01 DIAGNOSIS — D509 Iron deficiency anemia, unspecified: Secondary | ICD-10-CM | POA: Diagnosis not present

## 2015-01-01 DIAGNOSIS — R51 Headache: Secondary | ICD-10-CM | POA: Diagnosis not present

## 2015-01-01 DIAGNOSIS — N2581 Secondary hyperparathyroidism of renal origin: Secondary | ICD-10-CM | POA: Diagnosis not present

## 2015-01-01 DIAGNOSIS — N186 End stage renal disease: Secondary | ICD-10-CM | POA: Diagnosis not present

## 2015-01-03 DIAGNOSIS — R51 Headache: Secondary | ICD-10-CM | POA: Diagnosis not present

## 2015-01-03 DIAGNOSIS — D509 Iron deficiency anemia, unspecified: Secondary | ICD-10-CM | POA: Diagnosis not present

## 2015-01-03 DIAGNOSIS — D631 Anemia in chronic kidney disease: Secondary | ICD-10-CM | POA: Diagnosis not present

## 2015-01-03 DIAGNOSIS — N186 End stage renal disease: Secondary | ICD-10-CM | POA: Diagnosis not present

## 2015-01-03 DIAGNOSIS — D688 Other specified coagulation defects: Secondary | ICD-10-CM | POA: Diagnosis not present

## 2015-01-03 DIAGNOSIS — N2581 Secondary hyperparathyroidism of renal origin: Secondary | ICD-10-CM | POA: Diagnosis not present

## 2015-01-03 DIAGNOSIS — Z23 Encounter for immunization: Secondary | ICD-10-CM | POA: Diagnosis not present

## 2015-01-06 DIAGNOSIS — Z23 Encounter for immunization: Secondary | ICD-10-CM | POA: Diagnosis not present

## 2015-01-06 DIAGNOSIS — R51 Headache: Secondary | ICD-10-CM | POA: Diagnosis not present

## 2015-01-06 DIAGNOSIS — D688 Other specified coagulation defects: Secondary | ICD-10-CM | POA: Diagnosis not present

## 2015-01-06 DIAGNOSIS — N19 Unspecified kidney failure: Secondary | ICD-10-CM | POA: Diagnosis not present

## 2015-01-06 DIAGNOSIS — D631 Anemia in chronic kidney disease: Secondary | ICD-10-CM | POA: Diagnosis not present

## 2015-01-06 DIAGNOSIS — N186 End stage renal disease: Secondary | ICD-10-CM | POA: Diagnosis not present

## 2015-01-06 DIAGNOSIS — D509 Iron deficiency anemia, unspecified: Secondary | ICD-10-CM | POA: Diagnosis not present

## 2015-01-06 DIAGNOSIS — N2581 Secondary hyperparathyroidism of renal origin: Secondary | ICD-10-CM | POA: Diagnosis not present

## 2015-01-08 DIAGNOSIS — D509 Iron deficiency anemia, unspecified: Secondary | ICD-10-CM | POA: Diagnosis not present

## 2015-01-08 DIAGNOSIS — D631 Anemia in chronic kidney disease: Secondary | ICD-10-CM | POA: Diagnosis not present

## 2015-01-08 DIAGNOSIS — Z992 Dependence on renal dialysis: Secondary | ICD-10-CM | POA: Diagnosis not present

## 2015-01-08 DIAGNOSIS — I129 Hypertensive chronic kidney disease with stage 1 through stage 4 chronic kidney disease, or unspecified chronic kidney disease: Secondary | ICD-10-CM | POA: Diagnosis not present

## 2015-01-08 DIAGNOSIS — N186 End stage renal disease: Secondary | ICD-10-CM | POA: Diagnosis not present

## 2015-01-08 DIAGNOSIS — R51 Headache: Secondary | ICD-10-CM | POA: Diagnosis not present

## 2015-01-08 DIAGNOSIS — D688 Other specified coagulation defects: Secondary | ICD-10-CM | POA: Diagnosis not present

## 2015-01-08 DIAGNOSIS — Z23 Encounter for immunization: Secondary | ICD-10-CM | POA: Diagnosis not present

## 2015-01-08 DIAGNOSIS — N2581 Secondary hyperparathyroidism of renal origin: Secondary | ICD-10-CM | POA: Diagnosis not present

## 2015-01-10 DIAGNOSIS — D688 Other specified coagulation defects: Secondary | ICD-10-CM | POA: Diagnosis not present

## 2015-01-10 DIAGNOSIS — D509 Iron deficiency anemia, unspecified: Secondary | ICD-10-CM | POA: Diagnosis not present

## 2015-01-10 DIAGNOSIS — N186 End stage renal disease: Secondary | ICD-10-CM | POA: Diagnosis not present

## 2015-01-10 DIAGNOSIS — D631 Anemia in chronic kidney disease: Secondary | ICD-10-CM | POA: Diagnosis not present

## 2015-01-13 DIAGNOSIS — D631 Anemia in chronic kidney disease: Secondary | ICD-10-CM | POA: Diagnosis not present

## 2015-01-13 DIAGNOSIS — D509 Iron deficiency anemia, unspecified: Secondary | ICD-10-CM | POA: Diagnosis not present

## 2015-01-13 DIAGNOSIS — D688 Other specified coagulation defects: Secondary | ICD-10-CM | POA: Diagnosis not present

## 2015-01-13 DIAGNOSIS — N186 End stage renal disease: Secondary | ICD-10-CM | POA: Diagnosis not present

## 2015-01-15 DIAGNOSIS — N186 End stage renal disease: Secondary | ICD-10-CM | POA: Diagnosis not present

## 2015-01-15 DIAGNOSIS — D688 Other specified coagulation defects: Secondary | ICD-10-CM | POA: Diagnosis not present

## 2015-01-15 DIAGNOSIS — D509 Iron deficiency anemia, unspecified: Secondary | ICD-10-CM | POA: Diagnosis not present

## 2015-01-15 DIAGNOSIS — D631 Anemia in chronic kidney disease: Secondary | ICD-10-CM | POA: Diagnosis not present

## 2015-01-17 DIAGNOSIS — D509 Iron deficiency anemia, unspecified: Secondary | ICD-10-CM | POA: Diagnosis not present

## 2015-01-17 DIAGNOSIS — D688 Other specified coagulation defects: Secondary | ICD-10-CM | POA: Diagnosis not present

## 2015-01-17 DIAGNOSIS — N186 End stage renal disease: Secondary | ICD-10-CM | POA: Diagnosis not present

## 2015-01-17 DIAGNOSIS — D631 Anemia in chronic kidney disease: Secondary | ICD-10-CM | POA: Diagnosis not present

## 2015-01-20 DIAGNOSIS — D509 Iron deficiency anemia, unspecified: Secondary | ICD-10-CM | POA: Diagnosis not present

## 2015-01-20 DIAGNOSIS — D688 Other specified coagulation defects: Secondary | ICD-10-CM | POA: Diagnosis not present

## 2015-01-20 DIAGNOSIS — N186 End stage renal disease: Secondary | ICD-10-CM | POA: Diagnosis not present

## 2015-01-20 DIAGNOSIS — D631 Anemia in chronic kidney disease: Secondary | ICD-10-CM | POA: Diagnosis not present

## 2015-01-22 DIAGNOSIS — D631 Anemia in chronic kidney disease: Secondary | ICD-10-CM | POA: Diagnosis not present

## 2015-01-22 DIAGNOSIS — N186 End stage renal disease: Secondary | ICD-10-CM | POA: Diagnosis not present

## 2015-01-22 DIAGNOSIS — D509 Iron deficiency anemia, unspecified: Secondary | ICD-10-CM | POA: Diagnosis not present

## 2015-01-22 DIAGNOSIS — D688 Other specified coagulation defects: Secondary | ICD-10-CM | POA: Diagnosis not present

## 2015-01-24 DIAGNOSIS — N186 End stage renal disease: Secondary | ICD-10-CM | POA: Diagnosis not present

## 2015-01-24 DIAGNOSIS — D688 Other specified coagulation defects: Secondary | ICD-10-CM | POA: Diagnosis not present

## 2015-01-24 DIAGNOSIS — D509 Iron deficiency anemia, unspecified: Secondary | ICD-10-CM | POA: Diagnosis not present

## 2015-01-24 DIAGNOSIS — D631 Anemia in chronic kidney disease: Secondary | ICD-10-CM | POA: Diagnosis not present

## 2015-01-27 DIAGNOSIS — D631 Anemia in chronic kidney disease: Secondary | ICD-10-CM | POA: Diagnosis not present

## 2015-01-27 DIAGNOSIS — N186 End stage renal disease: Secondary | ICD-10-CM | POA: Diagnosis not present

## 2015-01-27 DIAGNOSIS — D688 Other specified coagulation defects: Secondary | ICD-10-CM | POA: Diagnosis not present

## 2015-01-27 DIAGNOSIS — D509 Iron deficiency anemia, unspecified: Secondary | ICD-10-CM | POA: Diagnosis not present

## 2015-01-28 DIAGNOSIS — I10 Essential (primary) hypertension: Secondary | ICD-10-CM | POA: Diagnosis not present

## 2015-01-28 DIAGNOSIS — E784 Other hyperlipidemia: Secondary | ICD-10-CM | POA: Diagnosis not present

## 2015-01-28 DIAGNOSIS — K219 Gastro-esophageal reflux disease without esophagitis: Secondary | ICD-10-CM | POA: Diagnosis not present

## 2015-01-29 DIAGNOSIS — D631 Anemia in chronic kidney disease: Secondary | ICD-10-CM | POA: Diagnosis not present

## 2015-01-29 DIAGNOSIS — D509 Iron deficiency anemia, unspecified: Secondary | ICD-10-CM | POA: Diagnosis not present

## 2015-01-29 DIAGNOSIS — D688 Other specified coagulation defects: Secondary | ICD-10-CM | POA: Diagnosis not present

## 2015-01-29 DIAGNOSIS — N186 End stage renal disease: Secondary | ICD-10-CM | POA: Diagnosis not present

## 2015-01-31 DIAGNOSIS — D631 Anemia in chronic kidney disease: Secondary | ICD-10-CM | POA: Diagnosis not present

## 2015-01-31 DIAGNOSIS — D688 Other specified coagulation defects: Secondary | ICD-10-CM | POA: Diagnosis not present

## 2015-01-31 DIAGNOSIS — D509 Iron deficiency anemia, unspecified: Secondary | ICD-10-CM | POA: Diagnosis not present

## 2015-01-31 DIAGNOSIS — N186 End stage renal disease: Secondary | ICD-10-CM | POA: Diagnosis not present

## 2015-02-03 DIAGNOSIS — N186 End stage renal disease: Secondary | ICD-10-CM | POA: Diagnosis not present

## 2015-02-03 DIAGNOSIS — D688 Other specified coagulation defects: Secondary | ICD-10-CM | POA: Diagnosis not present

## 2015-02-03 DIAGNOSIS — D631 Anemia in chronic kidney disease: Secondary | ICD-10-CM | POA: Diagnosis not present

## 2015-02-03 DIAGNOSIS — D509 Iron deficiency anemia, unspecified: Secondary | ICD-10-CM | POA: Diagnosis not present

## 2015-02-05 DIAGNOSIS — N19 Unspecified kidney failure: Secondary | ICD-10-CM | POA: Diagnosis not present

## 2015-02-05 DIAGNOSIS — D509 Iron deficiency anemia, unspecified: Secondary | ICD-10-CM | POA: Diagnosis not present

## 2015-02-05 DIAGNOSIS — D631 Anemia in chronic kidney disease: Secondary | ICD-10-CM | POA: Diagnosis not present

## 2015-02-05 DIAGNOSIS — D688 Other specified coagulation defects: Secondary | ICD-10-CM | POA: Diagnosis not present

## 2015-02-05 DIAGNOSIS — N186 End stage renal disease: Secondary | ICD-10-CM | POA: Diagnosis not present

## 2015-02-07 DIAGNOSIS — D688 Other specified coagulation defects: Secondary | ICD-10-CM | POA: Diagnosis not present

## 2015-02-07 DIAGNOSIS — D631 Anemia in chronic kidney disease: Secondary | ICD-10-CM | POA: Diagnosis not present

## 2015-02-07 DIAGNOSIS — N186 End stage renal disease: Secondary | ICD-10-CM | POA: Diagnosis not present

## 2015-02-07 DIAGNOSIS — D509 Iron deficiency anemia, unspecified: Secondary | ICD-10-CM | POA: Diagnosis not present

## 2015-02-08 DIAGNOSIS — Z992 Dependence on renal dialysis: Secondary | ICD-10-CM | POA: Diagnosis not present

## 2015-02-08 DIAGNOSIS — N186 End stage renal disease: Secondary | ICD-10-CM | POA: Diagnosis not present

## 2015-02-08 DIAGNOSIS — I129 Hypertensive chronic kidney disease with stage 1 through stage 4 chronic kidney disease, or unspecified chronic kidney disease: Secondary | ICD-10-CM | POA: Diagnosis not present

## 2015-02-10 DIAGNOSIS — N2581 Secondary hyperparathyroidism of renal origin: Secondary | ICD-10-CM | POA: Diagnosis not present

## 2015-02-10 DIAGNOSIS — D631 Anemia in chronic kidney disease: Secondary | ICD-10-CM | POA: Diagnosis not present

## 2015-02-10 DIAGNOSIS — D509 Iron deficiency anemia, unspecified: Secondary | ICD-10-CM | POA: Diagnosis not present

## 2015-02-10 DIAGNOSIS — N186 End stage renal disease: Secondary | ICD-10-CM | POA: Diagnosis not present

## 2015-02-10 DIAGNOSIS — D688 Other specified coagulation defects: Secondary | ICD-10-CM | POA: Diagnosis not present

## 2015-02-12 DIAGNOSIS — N186 End stage renal disease: Secondary | ICD-10-CM | POA: Diagnosis not present

## 2015-02-12 DIAGNOSIS — N2581 Secondary hyperparathyroidism of renal origin: Secondary | ICD-10-CM | POA: Diagnosis not present

## 2015-02-12 DIAGNOSIS — D688 Other specified coagulation defects: Secondary | ICD-10-CM | POA: Diagnosis not present

## 2015-02-12 DIAGNOSIS — D509 Iron deficiency anemia, unspecified: Secondary | ICD-10-CM | POA: Diagnosis not present

## 2015-02-12 DIAGNOSIS — D631 Anemia in chronic kidney disease: Secondary | ICD-10-CM | POA: Diagnosis not present

## 2015-02-14 DIAGNOSIS — D688 Other specified coagulation defects: Secondary | ICD-10-CM | POA: Diagnosis not present

## 2015-02-14 DIAGNOSIS — D631 Anemia in chronic kidney disease: Secondary | ICD-10-CM | POA: Diagnosis not present

## 2015-02-14 DIAGNOSIS — D509 Iron deficiency anemia, unspecified: Secondary | ICD-10-CM | POA: Diagnosis not present

## 2015-02-14 DIAGNOSIS — N2581 Secondary hyperparathyroidism of renal origin: Secondary | ICD-10-CM | POA: Diagnosis not present

## 2015-02-14 DIAGNOSIS — N186 End stage renal disease: Secondary | ICD-10-CM | POA: Diagnosis not present

## 2015-02-17 DIAGNOSIS — N2581 Secondary hyperparathyroidism of renal origin: Secondary | ICD-10-CM | POA: Diagnosis not present

## 2015-02-17 DIAGNOSIS — D631 Anemia in chronic kidney disease: Secondary | ICD-10-CM | POA: Diagnosis not present

## 2015-02-17 DIAGNOSIS — D509 Iron deficiency anemia, unspecified: Secondary | ICD-10-CM | POA: Diagnosis not present

## 2015-02-17 DIAGNOSIS — N186 End stage renal disease: Secondary | ICD-10-CM | POA: Diagnosis not present

## 2015-02-17 DIAGNOSIS — D688 Other specified coagulation defects: Secondary | ICD-10-CM | POA: Diagnosis not present

## 2015-02-19 DIAGNOSIS — D631 Anemia in chronic kidney disease: Secondary | ICD-10-CM | POA: Diagnosis not present

## 2015-02-19 DIAGNOSIS — D509 Iron deficiency anemia, unspecified: Secondary | ICD-10-CM | POA: Diagnosis not present

## 2015-02-19 DIAGNOSIS — D688 Other specified coagulation defects: Secondary | ICD-10-CM | POA: Diagnosis not present

## 2015-02-19 DIAGNOSIS — N186 End stage renal disease: Secondary | ICD-10-CM | POA: Diagnosis not present

## 2015-02-19 DIAGNOSIS — N2581 Secondary hyperparathyroidism of renal origin: Secondary | ICD-10-CM | POA: Diagnosis not present

## 2015-02-21 DIAGNOSIS — N186 End stage renal disease: Secondary | ICD-10-CM | POA: Diagnosis not present

## 2015-02-21 DIAGNOSIS — N2581 Secondary hyperparathyroidism of renal origin: Secondary | ICD-10-CM | POA: Diagnosis not present

## 2015-02-21 DIAGNOSIS — D631 Anemia in chronic kidney disease: Secondary | ICD-10-CM | POA: Diagnosis not present

## 2015-02-21 DIAGNOSIS — D509 Iron deficiency anemia, unspecified: Secondary | ICD-10-CM | POA: Diagnosis not present

## 2015-02-21 DIAGNOSIS — D688 Other specified coagulation defects: Secondary | ICD-10-CM | POA: Diagnosis not present

## 2015-02-24 DIAGNOSIS — N2581 Secondary hyperparathyroidism of renal origin: Secondary | ICD-10-CM | POA: Diagnosis not present

## 2015-02-24 DIAGNOSIS — D688 Other specified coagulation defects: Secondary | ICD-10-CM | POA: Diagnosis not present

## 2015-02-24 DIAGNOSIS — D631 Anemia in chronic kidney disease: Secondary | ICD-10-CM | POA: Diagnosis not present

## 2015-02-24 DIAGNOSIS — D509 Iron deficiency anemia, unspecified: Secondary | ICD-10-CM | POA: Diagnosis not present

## 2015-02-24 DIAGNOSIS — N186 End stage renal disease: Secondary | ICD-10-CM | POA: Diagnosis not present

## 2015-02-26 DIAGNOSIS — D631 Anemia in chronic kidney disease: Secondary | ICD-10-CM | POA: Diagnosis not present

## 2015-02-26 DIAGNOSIS — N186 End stage renal disease: Secondary | ICD-10-CM | POA: Diagnosis not present

## 2015-02-26 DIAGNOSIS — D688 Other specified coagulation defects: Secondary | ICD-10-CM | POA: Diagnosis not present

## 2015-02-26 DIAGNOSIS — D509 Iron deficiency anemia, unspecified: Secondary | ICD-10-CM | POA: Diagnosis not present

## 2015-02-26 DIAGNOSIS — N2581 Secondary hyperparathyroidism of renal origin: Secondary | ICD-10-CM | POA: Diagnosis not present

## 2015-02-28 DIAGNOSIS — N186 End stage renal disease: Secondary | ICD-10-CM | POA: Diagnosis not present

## 2015-02-28 DIAGNOSIS — D509 Iron deficiency anemia, unspecified: Secondary | ICD-10-CM | POA: Diagnosis not present

## 2015-02-28 DIAGNOSIS — D688 Other specified coagulation defects: Secondary | ICD-10-CM | POA: Diagnosis not present

## 2015-02-28 DIAGNOSIS — D631 Anemia in chronic kidney disease: Secondary | ICD-10-CM | POA: Diagnosis not present

## 2015-02-28 DIAGNOSIS — N2581 Secondary hyperparathyroidism of renal origin: Secondary | ICD-10-CM | POA: Diagnosis not present

## 2015-03-03 DIAGNOSIS — D631 Anemia in chronic kidney disease: Secondary | ICD-10-CM | POA: Diagnosis not present

## 2015-03-03 DIAGNOSIS — D688 Other specified coagulation defects: Secondary | ICD-10-CM | POA: Diagnosis not present

## 2015-03-03 DIAGNOSIS — N2581 Secondary hyperparathyroidism of renal origin: Secondary | ICD-10-CM | POA: Diagnosis not present

## 2015-03-03 DIAGNOSIS — D509 Iron deficiency anemia, unspecified: Secondary | ICD-10-CM | POA: Diagnosis not present

## 2015-03-03 DIAGNOSIS — N186 End stage renal disease: Secondary | ICD-10-CM | POA: Diagnosis not present

## 2015-03-05 DIAGNOSIS — N186 End stage renal disease: Secondary | ICD-10-CM | POA: Diagnosis not present

## 2015-03-05 DIAGNOSIS — D631 Anemia in chronic kidney disease: Secondary | ICD-10-CM | POA: Diagnosis not present

## 2015-03-05 DIAGNOSIS — D509 Iron deficiency anemia, unspecified: Secondary | ICD-10-CM | POA: Diagnosis not present

## 2015-03-05 DIAGNOSIS — D688 Other specified coagulation defects: Secondary | ICD-10-CM | POA: Diagnosis not present

## 2015-03-05 DIAGNOSIS — N2581 Secondary hyperparathyroidism of renal origin: Secondary | ICD-10-CM | POA: Diagnosis not present

## 2015-03-07 DIAGNOSIS — N186 End stage renal disease: Secondary | ICD-10-CM | POA: Diagnosis not present

## 2015-03-07 DIAGNOSIS — D509 Iron deficiency anemia, unspecified: Secondary | ICD-10-CM | POA: Diagnosis not present

## 2015-03-07 DIAGNOSIS — D631 Anemia in chronic kidney disease: Secondary | ICD-10-CM | POA: Diagnosis not present

## 2015-03-07 DIAGNOSIS — N2581 Secondary hyperparathyroidism of renal origin: Secondary | ICD-10-CM | POA: Diagnosis not present

## 2015-03-07 DIAGNOSIS — D688 Other specified coagulation defects: Secondary | ICD-10-CM | POA: Diagnosis not present

## 2015-03-08 DIAGNOSIS — N19 Unspecified kidney failure: Secondary | ICD-10-CM | POA: Diagnosis not present

## 2015-03-10 DIAGNOSIS — D631 Anemia in chronic kidney disease: Secondary | ICD-10-CM | POA: Diagnosis not present

## 2015-03-10 DIAGNOSIS — D509 Iron deficiency anemia, unspecified: Secondary | ICD-10-CM | POA: Diagnosis not present

## 2015-03-10 DIAGNOSIS — N2581 Secondary hyperparathyroidism of renal origin: Secondary | ICD-10-CM | POA: Diagnosis not present

## 2015-03-10 DIAGNOSIS — D688 Other specified coagulation defects: Secondary | ICD-10-CM | POA: Diagnosis not present

## 2015-03-10 DIAGNOSIS — N186 End stage renal disease: Secondary | ICD-10-CM | POA: Diagnosis not present

## 2015-03-11 DIAGNOSIS — Z992 Dependence on renal dialysis: Secondary | ICD-10-CM | POA: Diagnosis not present

## 2015-03-11 DIAGNOSIS — N186 End stage renal disease: Secondary | ICD-10-CM | POA: Diagnosis not present

## 2015-03-11 DIAGNOSIS — I129 Hypertensive chronic kidney disease with stage 1 through stage 4 chronic kidney disease, or unspecified chronic kidney disease: Secondary | ICD-10-CM | POA: Diagnosis not present

## 2015-03-12 DIAGNOSIS — M109 Gout, unspecified: Secondary | ICD-10-CM | POA: Diagnosis not present

## 2015-03-12 DIAGNOSIS — D631 Anemia in chronic kidney disease: Secondary | ICD-10-CM | POA: Diagnosis not present

## 2015-03-12 DIAGNOSIS — D688 Other specified coagulation defects: Secondary | ICD-10-CM | POA: Diagnosis not present

## 2015-03-12 DIAGNOSIS — D509 Iron deficiency anemia, unspecified: Secondary | ICD-10-CM | POA: Diagnosis not present

## 2015-03-12 DIAGNOSIS — N186 End stage renal disease: Secondary | ICD-10-CM | POA: Diagnosis not present

## 2015-03-12 DIAGNOSIS — N2581 Secondary hyperparathyroidism of renal origin: Secondary | ICD-10-CM | POA: Diagnosis not present

## 2015-03-12 DIAGNOSIS — E209 Hypoparathyroidism, unspecified: Secondary | ICD-10-CM | POA: Diagnosis not present

## 2015-03-14 DIAGNOSIS — N2581 Secondary hyperparathyroidism of renal origin: Secondary | ICD-10-CM | POA: Diagnosis not present

## 2015-03-14 DIAGNOSIS — N186 End stage renal disease: Secondary | ICD-10-CM | POA: Diagnosis not present

## 2015-03-14 DIAGNOSIS — M109 Gout, unspecified: Secondary | ICD-10-CM | POA: Diagnosis not present

## 2015-03-14 DIAGNOSIS — E209 Hypoparathyroidism, unspecified: Secondary | ICD-10-CM | POA: Diagnosis not present

## 2015-03-14 DIAGNOSIS — D631 Anemia in chronic kidney disease: Secondary | ICD-10-CM | POA: Diagnosis not present

## 2015-03-14 DIAGNOSIS — D509 Iron deficiency anemia, unspecified: Secondary | ICD-10-CM | POA: Diagnosis not present

## 2015-03-14 DIAGNOSIS — D688 Other specified coagulation defects: Secondary | ICD-10-CM | POA: Diagnosis not present

## 2015-03-17 DIAGNOSIS — D688 Other specified coagulation defects: Secondary | ICD-10-CM | POA: Diagnosis not present

## 2015-03-17 DIAGNOSIS — E209 Hypoparathyroidism, unspecified: Secondary | ICD-10-CM | POA: Diagnosis not present

## 2015-03-17 DIAGNOSIS — D631 Anemia in chronic kidney disease: Secondary | ICD-10-CM | POA: Diagnosis not present

## 2015-03-17 DIAGNOSIS — D509 Iron deficiency anemia, unspecified: Secondary | ICD-10-CM | POA: Diagnosis not present

## 2015-03-17 DIAGNOSIS — N186 End stage renal disease: Secondary | ICD-10-CM | POA: Diagnosis not present

## 2015-03-17 DIAGNOSIS — M109 Gout, unspecified: Secondary | ICD-10-CM | POA: Diagnosis not present

## 2015-03-17 DIAGNOSIS — N2581 Secondary hyperparathyroidism of renal origin: Secondary | ICD-10-CM | POA: Diagnosis not present

## 2015-03-19 DIAGNOSIS — D631 Anemia in chronic kidney disease: Secondary | ICD-10-CM | POA: Diagnosis not present

## 2015-03-19 DIAGNOSIS — M109 Gout, unspecified: Secondary | ICD-10-CM | POA: Diagnosis not present

## 2015-03-19 DIAGNOSIS — D688 Other specified coagulation defects: Secondary | ICD-10-CM | POA: Diagnosis not present

## 2015-03-19 DIAGNOSIS — E209 Hypoparathyroidism, unspecified: Secondary | ICD-10-CM | POA: Diagnosis not present

## 2015-03-19 DIAGNOSIS — N186 End stage renal disease: Secondary | ICD-10-CM | POA: Diagnosis not present

## 2015-03-19 DIAGNOSIS — N2581 Secondary hyperparathyroidism of renal origin: Secondary | ICD-10-CM | POA: Diagnosis not present

## 2015-03-19 DIAGNOSIS — D509 Iron deficiency anemia, unspecified: Secondary | ICD-10-CM | POA: Diagnosis not present

## 2015-03-21 DIAGNOSIS — N2581 Secondary hyperparathyroidism of renal origin: Secondary | ICD-10-CM | POA: Diagnosis not present

## 2015-03-21 DIAGNOSIS — E209 Hypoparathyroidism, unspecified: Secondary | ICD-10-CM | POA: Diagnosis not present

## 2015-03-21 DIAGNOSIS — D631 Anemia in chronic kidney disease: Secondary | ICD-10-CM | POA: Diagnosis not present

## 2015-03-21 DIAGNOSIS — D688 Other specified coagulation defects: Secondary | ICD-10-CM | POA: Diagnosis not present

## 2015-03-21 DIAGNOSIS — N186 End stage renal disease: Secondary | ICD-10-CM | POA: Diagnosis not present

## 2015-03-21 DIAGNOSIS — D509 Iron deficiency anemia, unspecified: Secondary | ICD-10-CM | POA: Diagnosis not present

## 2015-03-21 DIAGNOSIS — M109 Gout, unspecified: Secondary | ICD-10-CM | POA: Diagnosis not present

## 2015-03-24 DIAGNOSIS — M109 Gout, unspecified: Secondary | ICD-10-CM | POA: Diagnosis not present

## 2015-03-24 DIAGNOSIS — E209 Hypoparathyroidism, unspecified: Secondary | ICD-10-CM | POA: Diagnosis not present

## 2015-03-24 DIAGNOSIS — D631 Anemia in chronic kidney disease: Secondary | ICD-10-CM | POA: Diagnosis not present

## 2015-03-24 DIAGNOSIS — N2581 Secondary hyperparathyroidism of renal origin: Secondary | ICD-10-CM | POA: Diagnosis not present

## 2015-03-24 DIAGNOSIS — D688 Other specified coagulation defects: Secondary | ICD-10-CM | POA: Diagnosis not present

## 2015-03-24 DIAGNOSIS — D509 Iron deficiency anemia, unspecified: Secondary | ICD-10-CM | POA: Diagnosis not present

## 2015-03-24 DIAGNOSIS — N186 End stage renal disease: Secondary | ICD-10-CM | POA: Diagnosis not present

## 2015-03-26 DIAGNOSIS — N186 End stage renal disease: Secondary | ICD-10-CM | POA: Diagnosis not present

## 2015-03-26 DIAGNOSIS — E209 Hypoparathyroidism, unspecified: Secondary | ICD-10-CM | POA: Diagnosis not present

## 2015-03-26 DIAGNOSIS — D631 Anemia in chronic kidney disease: Secondary | ICD-10-CM | POA: Diagnosis not present

## 2015-03-26 DIAGNOSIS — D509 Iron deficiency anemia, unspecified: Secondary | ICD-10-CM | POA: Diagnosis not present

## 2015-03-26 DIAGNOSIS — D688 Other specified coagulation defects: Secondary | ICD-10-CM | POA: Diagnosis not present

## 2015-03-26 DIAGNOSIS — N2581 Secondary hyperparathyroidism of renal origin: Secondary | ICD-10-CM | POA: Diagnosis not present

## 2015-03-26 DIAGNOSIS — M109 Gout, unspecified: Secondary | ICD-10-CM | POA: Diagnosis not present

## 2015-03-28 DIAGNOSIS — M109 Gout, unspecified: Secondary | ICD-10-CM | POA: Diagnosis not present

## 2015-03-28 DIAGNOSIS — E209 Hypoparathyroidism, unspecified: Secondary | ICD-10-CM | POA: Diagnosis not present

## 2015-03-28 DIAGNOSIS — D688 Other specified coagulation defects: Secondary | ICD-10-CM | POA: Diagnosis not present

## 2015-03-28 DIAGNOSIS — N186 End stage renal disease: Secondary | ICD-10-CM | POA: Diagnosis not present

## 2015-03-28 DIAGNOSIS — N2581 Secondary hyperparathyroidism of renal origin: Secondary | ICD-10-CM | POA: Diagnosis not present

## 2015-03-28 DIAGNOSIS — D631 Anemia in chronic kidney disease: Secondary | ICD-10-CM | POA: Diagnosis not present

## 2015-03-28 DIAGNOSIS — D509 Iron deficiency anemia, unspecified: Secondary | ICD-10-CM | POA: Diagnosis not present

## 2015-03-30 DIAGNOSIS — N186 End stage renal disease: Secondary | ICD-10-CM | POA: Diagnosis not present

## 2015-03-30 DIAGNOSIS — E784 Other hyperlipidemia: Secondary | ICD-10-CM | POA: Diagnosis not present

## 2015-03-30 DIAGNOSIS — I10 Essential (primary) hypertension: Secondary | ICD-10-CM | POA: Diagnosis not present

## 2015-03-30 DIAGNOSIS — J4520 Mild intermittent asthma, uncomplicated: Secondary | ICD-10-CM | POA: Diagnosis not present

## 2015-03-30 DIAGNOSIS — M179 Osteoarthritis of knee, unspecified: Secondary | ICD-10-CM | POA: Diagnosis not present

## 2015-03-31 DIAGNOSIS — D509 Iron deficiency anemia, unspecified: Secondary | ICD-10-CM | POA: Diagnosis not present

## 2015-03-31 DIAGNOSIS — E209 Hypoparathyroidism, unspecified: Secondary | ICD-10-CM | POA: Diagnosis not present

## 2015-03-31 DIAGNOSIS — N186 End stage renal disease: Secondary | ICD-10-CM | POA: Diagnosis not present

## 2015-03-31 DIAGNOSIS — D688 Other specified coagulation defects: Secondary | ICD-10-CM | POA: Diagnosis not present

## 2015-03-31 DIAGNOSIS — N2581 Secondary hyperparathyroidism of renal origin: Secondary | ICD-10-CM | POA: Diagnosis not present

## 2015-03-31 DIAGNOSIS — M109 Gout, unspecified: Secondary | ICD-10-CM | POA: Diagnosis not present

## 2015-03-31 DIAGNOSIS — D631 Anemia in chronic kidney disease: Secondary | ICD-10-CM | POA: Diagnosis not present

## 2015-04-02 DIAGNOSIS — N186 End stage renal disease: Secondary | ICD-10-CM | POA: Diagnosis not present

## 2015-04-02 DIAGNOSIS — M109 Gout, unspecified: Secondary | ICD-10-CM | POA: Diagnosis not present

## 2015-04-02 DIAGNOSIS — E209 Hypoparathyroidism, unspecified: Secondary | ICD-10-CM | POA: Diagnosis not present

## 2015-04-02 DIAGNOSIS — D509 Iron deficiency anemia, unspecified: Secondary | ICD-10-CM | POA: Diagnosis not present

## 2015-04-02 DIAGNOSIS — D688 Other specified coagulation defects: Secondary | ICD-10-CM | POA: Diagnosis not present

## 2015-04-02 DIAGNOSIS — N2581 Secondary hyperparathyroidism of renal origin: Secondary | ICD-10-CM | POA: Diagnosis not present

## 2015-04-02 DIAGNOSIS — D631 Anemia in chronic kidney disease: Secondary | ICD-10-CM | POA: Diagnosis not present

## 2015-04-04 DIAGNOSIS — D631 Anemia in chronic kidney disease: Secondary | ICD-10-CM | POA: Diagnosis not present

## 2015-04-04 DIAGNOSIS — E209 Hypoparathyroidism, unspecified: Secondary | ICD-10-CM | POA: Diagnosis not present

## 2015-04-04 DIAGNOSIS — N2581 Secondary hyperparathyroidism of renal origin: Secondary | ICD-10-CM | POA: Diagnosis not present

## 2015-04-04 DIAGNOSIS — M109 Gout, unspecified: Secondary | ICD-10-CM | POA: Diagnosis not present

## 2015-04-04 DIAGNOSIS — D688 Other specified coagulation defects: Secondary | ICD-10-CM | POA: Diagnosis not present

## 2015-04-04 DIAGNOSIS — D509 Iron deficiency anemia, unspecified: Secondary | ICD-10-CM | POA: Diagnosis not present

## 2015-04-04 DIAGNOSIS — N186 End stage renal disease: Secondary | ICD-10-CM | POA: Diagnosis not present

## 2015-04-07 DIAGNOSIS — D631 Anemia in chronic kidney disease: Secondary | ICD-10-CM | POA: Diagnosis not present

## 2015-04-07 DIAGNOSIS — E209 Hypoparathyroidism, unspecified: Secondary | ICD-10-CM | POA: Diagnosis not present

## 2015-04-07 DIAGNOSIS — N186 End stage renal disease: Secondary | ICD-10-CM | POA: Diagnosis not present

## 2015-04-07 DIAGNOSIS — N2581 Secondary hyperparathyroidism of renal origin: Secondary | ICD-10-CM | POA: Diagnosis not present

## 2015-04-07 DIAGNOSIS — D509 Iron deficiency anemia, unspecified: Secondary | ICD-10-CM | POA: Diagnosis not present

## 2015-04-07 DIAGNOSIS — D688 Other specified coagulation defects: Secondary | ICD-10-CM | POA: Diagnosis not present

## 2015-04-07 DIAGNOSIS — M109 Gout, unspecified: Secondary | ICD-10-CM | POA: Diagnosis not present

## 2015-04-08 DIAGNOSIS — N19 Unspecified kidney failure: Secondary | ICD-10-CM | POA: Diagnosis not present

## 2015-04-09 DIAGNOSIS — N2581 Secondary hyperparathyroidism of renal origin: Secondary | ICD-10-CM | POA: Diagnosis not present

## 2015-04-09 DIAGNOSIS — N186 End stage renal disease: Secondary | ICD-10-CM | POA: Diagnosis not present

## 2015-04-09 DIAGNOSIS — M109 Gout, unspecified: Secondary | ICD-10-CM | POA: Diagnosis not present

## 2015-04-09 DIAGNOSIS — D688 Other specified coagulation defects: Secondary | ICD-10-CM | POA: Diagnosis not present

## 2015-04-09 DIAGNOSIS — D509 Iron deficiency anemia, unspecified: Secondary | ICD-10-CM | POA: Diagnosis not present

## 2015-04-09 DIAGNOSIS — D631 Anemia in chronic kidney disease: Secondary | ICD-10-CM | POA: Diagnosis not present

## 2015-04-09 DIAGNOSIS — E209 Hypoparathyroidism, unspecified: Secondary | ICD-10-CM | POA: Diagnosis not present

## 2015-04-10 DIAGNOSIS — I129 Hypertensive chronic kidney disease with stage 1 through stage 4 chronic kidney disease, or unspecified chronic kidney disease: Secondary | ICD-10-CM | POA: Diagnosis not present

## 2015-04-10 DIAGNOSIS — Z992 Dependence on renal dialysis: Secondary | ICD-10-CM | POA: Diagnosis not present

## 2015-04-10 DIAGNOSIS — N186 End stage renal disease: Secondary | ICD-10-CM | POA: Diagnosis not present

## 2015-04-11 DIAGNOSIS — N186 End stage renal disease: Secondary | ICD-10-CM | POA: Diagnosis not present

## 2015-04-11 DIAGNOSIS — D688 Other specified coagulation defects: Secondary | ICD-10-CM | POA: Diagnosis not present

## 2015-04-11 DIAGNOSIS — D509 Iron deficiency anemia, unspecified: Secondary | ICD-10-CM | POA: Diagnosis not present

## 2015-04-11 DIAGNOSIS — Z23 Encounter for immunization: Secondary | ICD-10-CM | POA: Diagnosis not present

## 2015-04-11 DIAGNOSIS — D631 Anemia in chronic kidney disease: Secondary | ICD-10-CM | POA: Diagnosis not present

## 2015-04-11 DIAGNOSIS — N2581 Secondary hyperparathyroidism of renal origin: Secondary | ICD-10-CM | POA: Diagnosis not present

## 2015-04-14 DIAGNOSIS — D688 Other specified coagulation defects: Secondary | ICD-10-CM | POA: Diagnosis not present

## 2015-04-14 DIAGNOSIS — Z23 Encounter for immunization: Secondary | ICD-10-CM | POA: Diagnosis not present

## 2015-04-14 DIAGNOSIS — D631 Anemia in chronic kidney disease: Secondary | ICD-10-CM | POA: Diagnosis not present

## 2015-04-14 DIAGNOSIS — D509 Iron deficiency anemia, unspecified: Secondary | ICD-10-CM | POA: Diagnosis not present

## 2015-04-14 DIAGNOSIS — N2581 Secondary hyperparathyroidism of renal origin: Secondary | ICD-10-CM | POA: Diagnosis not present

## 2015-04-14 DIAGNOSIS — N186 End stage renal disease: Secondary | ICD-10-CM | POA: Diagnosis not present

## 2015-04-16 DIAGNOSIS — Z23 Encounter for immunization: Secondary | ICD-10-CM | POA: Diagnosis not present

## 2015-04-16 DIAGNOSIS — D509 Iron deficiency anemia, unspecified: Secondary | ICD-10-CM | POA: Diagnosis not present

## 2015-04-16 DIAGNOSIS — D688 Other specified coagulation defects: Secondary | ICD-10-CM | POA: Diagnosis not present

## 2015-04-16 DIAGNOSIS — N186 End stage renal disease: Secondary | ICD-10-CM | POA: Diagnosis not present

## 2015-04-16 DIAGNOSIS — N2581 Secondary hyperparathyroidism of renal origin: Secondary | ICD-10-CM | POA: Diagnosis not present

## 2015-04-16 DIAGNOSIS — D631 Anemia in chronic kidney disease: Secondary | ICD-10-CM | POA: Diagnosis not present

## 2015-04-18 DIAGNOSIS — D688 Other specified coagulation defects: Secondary | ICD-10-CM | POA: Diagnosis not present

## 2015-04-18 DIAGNOSIS — N2581 Secondary hyperparathyroidism of renal origin: Secondary | ICD-10-CM | POA: Diagnosis not present

## 2015-04-18 DIAGNOSIS — D631 Anemia in chronic kidney disease: Secondary | ICD-10-CM | POA: Diagnosis not present

## 2015-04-18 DIAGNOSIS — D509 Iron deficiency anemia, unspecified: Secondary | ICD-10-CM | POA: Diagnosis not present

## 2015-04-18 DIAGNOSIS — N186 End stage renal disease: Secondary | ICD-10-CM | POA: Diagnosis not present

## 2015-04-18 DIAGNOSIS — Z23 Encounter for immunization: Secondary | ICD-10-CM | POA: Diagnosis not present

## 2015-04-21 DIAGNOSIS — D509 Iron deficiency anemia, unspecified: Secondary | ICD-10-CM | POA: Diagnosis not present

## 2015-04-21 DIAGNOSIS — N2581 Secondary hyperparathyroidism of renal origin: Secondary | ICD-10-CM | POA: Diagnosis not present

## 2015-04-21 DIAGNOSIS — D631 Anemia in chronic kidney disease: Secondary | ICD-10-CM | POA: Diagnosis not present

## 2015-04-21 DIAGNOSIS — Z23 Encounter for immunization: Secondary | ICD-10-CM | POA: Diagnosis not present

## 2015-04-21 DIAGNOSIS — D688 Other specified coagulation defects: Secondary | ICD-10-CM | POA: Diagnosis not present

## 2015-04-21 DIAGNOSIS — N186 End stage renal disease: Secondary | ICD-10-CM | POA: Diagnosis not present

## 2015-04-23 DIAGNOSIS — Z23 Encounter for immunization: Secondary | ICD-10-CM | POA: Diagnosis not present

## 2015-04-23 DIAGNOSIS — N2581 Secondary hyperparathyroidism of renal origin: Secondary | ICD-10-CM | POA: Diagnosis not present

## 2015-04-23 DIAGNOSIS — D631 Anemia in chronic kidney disease: Secondary | ICD-10-CM | POA: Diagnosis not present

## 2015-04-23 DIAGNOSIS — D688 Other specified coagulation defects: Secondary | ICD-10-CM | POA: Diagnosis not present

## 2015-04-23 DIAGNOSIS — D509 Iron deficiency anemia, unspecified: Secondary | ICD-10-CM | POA: Diagnosis not present

## 2015-04-23 DIAGNOSIS — N186 End stage renal disease: Secondary | ICD-10-CM | POA: Diagnosis not present

## 2015-04-25 DIAGNOSIS — D509 Iron deficiency anemia, unspecified: Secondary | ICD-10-CM | POA: Diagnosis not present

## 2015-04-25 DIAGNOSIS — N186 End stage renal disease: Secondary | ICD-10-CM | POA: Diagnosis not present

## 2015-04-25 DIAGNOSIS — N2581 Secondary hyperparathyroidism of renal origin: Secondary | ICD-10-CM | POA: Diagnosis not present

## 2015-04-25 DIAGNOSIS — Z23 Encounter for immunization: Secondary | ICD-10-CM | POA: Diagnosis not present

## 2015-04-25 DIAGNOSIS — D631 Anemia in chronic kidney disease: Secondary | ICD-10-CM | POA: Diagnosis not present

## 2015-04-25 DIAGNOSIS — D688 Other specified coagulation defects: Secondary | ICD-10-CM | POA: Diagnosis not present

## 2015-04-28 ENCOUNTER — Encounter (HOSPITAL_COMMUNITY): Payer: Self-pay | Admitting: Emergency Medicine

## 2015-04-28 ENCOUNTER — Emergency Department (HOSPITAL_COMMUNITY)
Admission: EM | Admit: 2015-04-28 | Discharge: 2015-04-28 | Disposition: A | Discharge status: Home / Self Care | Payer: Medicare Other | Attending: Emergency Medicine | Admitting: Emergency Medicine

## 2015-04-28 DIAGNOSIS — Z79899 Other long term (current) drug therapy: Secondary | ICD-10-CM | POA: Insufficient documentation

## 2015-04-28 DIAGNOSIS — M109 Gout, unspecified: Secondary | ICD-10-CM | POA: Diagnosis not present

## 2015-04-28 DIAGNOSIS — M62838 Other muscle spasm: Secondary | ICD-10-CM | POA: Diagnosis not present

## 2015-04-28 DIAGNOSIS — M542 Cervicalgia: Secondary | ICD-10-CM | POA: Diagnosis present

## 2015-04-28 DIAGNOSIS — E785 Hyperlipidemia, unspecified: Secondary | ICD-10-CM | POA: Insufficient documentation

## 2015-04-28 DIAGNOSIS — I120 Hypertensive chronic kidney disease with stage 5 chronic kidney disease or end stage renal disease: Secondary | ICD-10-CM | POA: Diagnosis not present

## 2015-04-28 DIAGNOSIS — N185 Chronic kidney disease, stage 5: Secondary | ICD-10-CM | POA: Diagnosis not present

## 2015-04-28 DIAGNOSIS — F0390 Unspecified dementia without behavioral disturbance: Secondary | ICD-10-CM | POA: Diagnosis not present

## 2015-04-28 MED ORDER — DIAZEPAM 5 MG PO TABS: ORAL_TABLET | ORAL | Status: DC

## 2015-04-28 MED ORDER — DIAZEPAM 5 MG PO TABS
5.0000 mg | ORAL_TABLET | Freq: Once | ORAL | Status: AC
  Administered 2015-04-28: 5 mg via ORAL
  Filled 2015-04-28: qty 1

## 2015-04-28 NOTE — ED Notes (Signed)
Neck pain starting this Sunday worse when she tries to move it; goes from ear into shoulder on right side; missed dialysis this morning due to pain. Denies chest pain or SOB.

## 2015-04-28 NOTE — Discharge Instructions (Signed)
Please read and follow all provided instructions.  Your diagnoses today include:  1. Muscle spasms of neck    Tests performed today include:  Vital signs. See below for your results today.   Medications prescribed:   Valium - muscle relaxer medication  DO NOT drive or perform any activities that require you to be awake and alert because this medicine can make you drowsy.   Use medication only under direct supervision at the lowest possible dose needed to control your pain.   Take any prescribed medications only as directed.  Home care instructions:  Follow any educational materials contained in this packet.  Follow-up instructions: Please follow-up with your primary care provider in the next 3 days for further evaluation of your symptoms.   Return instructions:   Please return to the Emergency Department if you experience worsening symptoms.   Please return if you have any other emergent concerns.  Additional Information:  Your vital signs today were: BP 110/48 mmHg   Pulse 80   Temp(Src) 98.2 F (36.8 C) (Oral)   Resp 16   SpO2 100% If your blood pressure (BP) was elevated above 135/85 this visit, please have this repeated by your doctor within one month. --------------

## 2015-04-28 NOTE — ED Notes (Signed)
Pt comfortable with discharge and follow up instructions. Prescriptions x1 

## 2015-04-28 NOTE — ED Provider Notes (Signed)
CSN: [id number]     Arrival date & time 04/28/15  [id number] History   First MD Initiated Contact with Patient 04/28/15 1006     Chief Complaint  Patient presents with  . Neck Pain     (Consider location/radiation/quality/duration/timing/severity/associated sxs/prior Treatment) HPI Comments: Patient presents with history of ESRD on dialysis (Tu/Th/Sa), Coumadin use -- complaint of right-sided neck pain starting acutely yesterday without instigating cause. Pain is worse when she moves her neck or head especially when she looks to the right. Patient took hydrocodone on 3 different occasions at home which helps temporarily. Patient was at rest when the symptoms started. She has had no chest pain or shortness of breath. No nausea or vomiting. Pain is constant. She has had no vision changes, weakness/numbness/tingling in her arms or her legs. No other stroke symptoms. Patient has not had similar symptoms in the past. She missed dialysis this morning but has been in contact with her dialysis center.  The history is provided by the patient and medical records.    Past Medical History  Diagnosis Date  . Gout   . CKD (chronic kidney disease) stage V requiring chronic dialysis (HCC)   . Hypertension   . Hyperlipemia   . Dementia    Past Surgical History  Procedure Laterality Date  . Tubal ligation    . Av fistula placement     Family History  Problem Relation Age of Onset  . Colon cancer Neg Hx   . Diabetes Mellitus II Neg Hx    Social History  Substance Use Topics  . Smoking status: Never Smoker   . Smokeless tobacco: Never Used  . Alcohol Use: No   OB History    No data available     Review of Systems  Constitutional: Negative for fever.  HENT: Negative for congestion, dental problem, rhinorrhea, sinus pressure and sore throat.   Eyes: Negative for photophobia, discharge, redness and visual disturbance.  Respiratory: Negative for cough and shortness of breath.   Cardiovascular:  Negative for chest pain.  Gastrointestinal: Negative for nausea, vomiting, abdominal pain and diarrhea.  Genitourinary: Negative for dysuria.  Musculoskeletal: Positive for myalgias and neck pain. Negative for gait problem and neck stiffness.  Skin: Negative for rash.  Neurological: Negative for syncope, speech difficulty, weakness, light-headedness, numbness and headaches.  Psychiatric/Behavioral: Negative for confusion.      Allergies  Nsaids  Home Medications   Prior to Admission medications   Medication Sig Start Date End Date Taking? Authorizing Provider  albuterol (PROAIR HFA) 108 (90 BASE) MCG/ACT inhaler Inhale 2 puffs into the lungs every 6 (six) hours as needed for wheezing or shortness of breath.     Historical Provider, MD  albuterol (PROVENTIL) (2.5 MG/3ML) 0.083% nebulizer solution Take 3 mLs by nebulization daily as needed. 07/07/14   Historical Provider, MD  cinacalcet (SENSIPAR) 30 MG tablet Take 1 tablet (30 mg total) by mouth daily with breakfast. 06/09/14   Stephani PoliceMarianne L York, PA-C  feeding supplement, RESOURCE BREEZE, (RESOURCE BREEZE) LIQD Take 1 Container by mouth 2 (two) times daily between meals. 08/14/14   Richarda OverlieNayana Abrol, MD  FLOVENT DISKUS 100 MCG/BLIST AEPB Inhale 2 puffs into the lungs daily as needed. 05/19/14   Historical Provider, MD  guaifenesin (ROBITUSSIN) 100 MG/5ML syrup Take 10 mLs (200 mg total) by mouth every 4 (four) hours while awake. Patient not taking: Reported on 08/10/2014 07/09/14   Marinda ElkAbraham Feliz Ortiz, MD  HYDROcodone-acetaminophen (NORCO/VICODIN) 5-325 MG per tablet Take 1-2 tablets by  mouth every 4 (four) hours as needed for moderate pain or severe pain. Patient taking differently: Take 1 tablet by mouth every 4 (four) hours as needed for moderate pain or severe pain.  05/10/14   Marisa Severin, MD  LORazepam (ATIVAN) 0.5 MG tablet Take 1 tablet (0.5 mg total) by mouth every 12 (twelve) hours as needed for anxiety. Also-may give 1 hour prior to dialysis  06/23/14   Rhetta Mura, MD  midodrine (PROAMATINE) 10 MG tablet Take 1 tablet (10 mg total) by mouth 3 (three) times daily with meals. 07/09/14   [name], MD  multivitamin (RENA-VIT) TABS tablet Take 1 tablet by mouth at bedtime.     Historical Provider, MD  nitroGLYCERIN (NITROSTAT) 0.4 MG SL tablet Place 0.4 mg under the tongue every 5 (five) minutes as needed for chest pain.    Historical Provider, MD  Nutritional Supplements (FEEDING SUPPLEMENT, NEPRO CARB STEADY,) LIQD Take 237 mLs by mouth 2 (two) times daily between meals. Patient taking differently: Take 237 mLs by mouth daily as needed (feeding supplement).  06/09/14   Stephani Police, PA-C  traMADol (ULTRAM) 50 MG tablet Take 1 tablet (50 mg total) by mouth every 6 (six) hours as needed for moderate pain. Patient not taking: Reported on 08/10/2014 06/23/14   Rhetta Mura, MD  warfarin (COUMADIN) 4 MG tablet Take 1 tablet (4 mg total) by mouth every evening. 07/11/14   [name], MD   BP 110/48 mmHg  Pulse 80  Temp(Src) 98.2 F (36.8 C) (Oral)  Resp 16  SpO2 100% Physical Exam  Constitutional: She appears well-developed and well-nourished.  HENT:  Head: Normocephalic and atraumatic.  Mouth/Throat: Mucous membranes are normal. Mucous membranes are not dry.  Eyes: Conjunctivae are normal. Right eye exhibits no discharge. Left eye exhibits no discharge.  Neck: Trachea normal and normal range of motion. Neck supple. Normal carotid pulses and no JVD present. No muscular tenderness present. Carotid bruit is not present. No tracheal deviation present.  Cardiovascular: Normal rate, regular rhythm, S1 normal, S2 normal, normal heart sounds and intact distal pulses.  Exam reveals no decreased pulses.   No murmur heard. Pulmonary/Chest: Effort normal and breath sounds normal. No respiratory distress. She has no wheezes. She exhibits no tenderness.  Abdominal: Soft. Normal aorta and bowel sounds are normal.  There is no tenderness. There is no rebound, no guarding and no CVA tenderness.  Musculoskeletal: Normal range of motion. She exhibits tenderness.       Right shoulder: Normal.       Left shoulder: Normal.       Cervical back: She exhibits tenderness and spasm. She exhibits normal range of motion and no bony tenderness.       Thoracic back: She exhibits no tenderness.       Lumbar back: She exhibits no tenderness.       Back:  No step-off noted with palpation of spine.   Neurological: She is alert. She has normal strength and normal reflexes. No sensory deficit.  5/5 strength in entire lower extremities bilaterally. No sensation deficit.   Skin: Skin is warm and dry. No rash noted. She is not diaphoretic. No cyanosis. No pallor.  Right upper extremity AV fistula with palpable thrill.  Psychiatric: She has a normal mood and affect.  Nursing note and vitals reviewed.   ED Course  Procedures (including critical care time) Labs Review Labs Reviewed - No data to display  Imaging Review No results found. I have  personally reviewed and evaluated these images and lab results as part of my medical decision-making.   EKG Interpretation   Date/Time:  Tuesday April 28 2015 10:13:08 EDT Ventricular Rate:  75 PR Interval:  179 QRS Duration: 117 QT Interval:  473 QTC Calculation: 528 R Axis:   -23 Text Interpretation:  Sinus rhythm Ventricular premature complex  Incomplete RBBB and LAFB Probable anterior infarct, age indeterminate  Baseline wander TECHNICALLY DIFFICULT No significant change since last  tracing Confirmed by FLOYD MD, DANIEL ([id number]) on 04/28/2015 10:19:41 AM      10:20 AM Patient seen and examined. EKG reviewed. Patient has symptoms consistent with R trapezius spasm. No stroke symptoms. No other cardiac symptoms. EKG does not show peaked T waves or signs of ischemia. Patient appears very well. Do not feel that workup in light of missed dialysis this morning is necessary.  She does not appear fluid overloaded. She needs to follow-up with her dialysis center to make up her session.  Vital signs reviewed and are as follows: BP 110/48 mmHg  Pulse 80  Temp(Src) 98.2 F (36.8 C) (Oral)  Resp 16  SpO2 100%  Pt seen by Dr. Adela Lank.   Patient counseled on proper use of muscle relaxant medication.  They were told not to drink alcohol, drive any vehicle, or do any dangerous activities while taking this medication.  Patient verbalized understanding.  Use pain medication only under direct supervision at the lowest possible dose needed to control your pain.     MDM   Final diagnoses:  Muscle spasms of neck   Patient with neck pain. Suspect musculoskeletal calls as above. No concern for cardiac etiology. No concern for vascular etiology such as dissection the neck patient has no neurological symptoms. Pain is worse with movement and palpation. She has palpable spasm. Treatment as above.   Renne Crigler, PA-C 04/28/15 1032  Melene Plan, DO 04/28/15 1153

## 2015-04-30 DIAGNOSIS — D688 Other specified coagulation defects: Secondary | ICD-10-CM | POA: Diagnosis not present

## 2015-04-30 DIAGNOSIS — D509 Iron deficiency anemia, unspecified: Secondary | ICD-10-CM | POA: Diagnosis not present

## 2015-04-30 DIAGNOSIS — N2581 Secondary hyperparathyroidism of renal origin: Secondary | ICD-10-CM | POA: Diagnosis not present

## 2015-04-30 DIAGNOSIS — D631 Anemia in chronic kidney disease: Secondary | ICD-10-CM | POA: Diagnosis not present

## 2015-04-30 DIAGNOSIS — N186 End stage renal disease: Secondary | ICD-10-CM | POA: Diagnosis not present

## 2015-04-30 DIAGNOSIS — Z23 Encounter for immunization: Secondary | ICD-10-CM | POA: Diagnosis not present

## 2015-05-02 DIAGNOSIS — N186 End stage renal disease: Secondary | ICD-10-CM | POA: Diagnosis not present

## 2015-05-02 DIAGNOSIS — D688 Other specified coagulation defects: Secondary | ICD-10-CM | POA: Diagnosis not present

## 2015-05-02 DIAGNOSIS — Z23 Encounter for immunization: Secondary | ICD-10-CM | POA: Diagnosis not present

## 2015-05-02 DIAGNOSIS — N2581 Secondary hyperparathyroidism of renal origin: Secondary | ICD-10-CM | POA: Diagnosis not present

## 2015-05-02 DIAGNOSIS — D509 Iron deficiency anemia, unspecified: Secondary | ICD-10-CM | POA: Diagnosis not present

## 2015-05-02 DIAGNOSIS — D631 Anemia in chronic kidney disease: Secondary | ICD-10-CM | POA: Diagnosis not present

## 2015-05-05 DIAGNOSIS — D688 Other specified coagulation defects: Secondary | ICD-10-CM | POA: Diagnosis not present

## 2015-05-05 DIAGNOSIS — D509 Iron deficiency anemia, unspecified: Secondary | ICD-10-CM | POA: Diagnosis not present

## 2015-05-05 DIAGNOSIS — N2581 Secondary hyperparathyroidism of renal origin: Secondary | ICD-10-CM | POA: Diagnosis not present

## 2015-05-05 DIAGNOSIS — D631 Anemia in chronic kidney disease: Secondary | ICD-10-CM | POA: Diagnosis not present

## 2015-05-05 DIAGNOSIS — Z23 Encounter for immunization: Secondary | ICD-10-CM | POA: Diagnosis not present

## 2015-05-05 DIAGNOSIS — N186 End stage renal disease: Secondary | ICD-10-CM | POA: Diagnosis not present

## 2015-05-07 DIAGNOSIS — N2581 Secondary hyperparathyroidism of renal origin: Secondary | ICD-10-CM | POA: Diagnosis not present

## 2015-05-07 DIAGNOSIS — D631 Anemia in chronic kidney disease: Secondary | ICD-10-CM | POA: Diagnosis not present

## 2015-05-07 DIAGNOSIS — D688 Other specified coagulation defects: Secondary | ICD-10-CM | POA: Diagnosis not present

## 2015-05-07 DIAGNOSIS — D509 Iron deficiency anemia, unspecified: Secondary | ICD-10-CM | POA: Diagnosis not present

## 2015-05-07 DIAGNOSIS — N186 End stage renal disease: Secondary | ICD-10-CM | POA: Diagnosis not present

## 2015-05-07 DIAGNOSIS — Z23 Encounter for immunization: Secondary | ICD-10-CM | POA: Diagnosis not present

## 2015-05-09 DIAGNOSIS — D631 Anemia in chronic kidney disease: Secondary | ICD-10-CM | POA: Diagnosis not present

## 2015-05-09 DIAGNOSIS — Z23 Encounter for immunization: Secondary | ICD-10-CM | POA: Diagnosis not present

## 2015-05-09 DIAGNOSIS — N2581 Secondary hyperparathyroidism of renal origin: Secondary | ICD-10-CM | POA: Diagnosis not present

## 2015-05-09 DIAGNOSIS — D688 Other specified coagulation defects: Secondary | ICD-10-CM | POA: Diagnosis not present

## 2015-05-09 DIAGNOSIS — N186 End stage renal disease: Secondary | ICD-10-CM | POA: Diagnosis not present

## 2015-05-09 DIAGNOSIS — D509 Iron deficiency anemia, unspecified: Secondary | ICD-10-CM | POA: Diagnosis not present

## 2015-05-11 DIAGNOSIS — Z992 Dependence on renal dialysis: Secondary | ICD-10-CM | POA: Diagnosis not present

## 2015-05-11 DIAGNOSIS — N186 End stage renal disease: Secondary | ICD-10-CM | POA: Diagnosis not present

## 2015-05-11 DIAGNOSIS — I129 Hypertensive chronic kidney disease with stage 1 through stage 4 chronic kidney disease, or unspecified chronic kidney disease: Secondary | ICD-10-CM | POA: Diagnosis not present

## 2015-05-12 DIAGNOSIS — E8779 Other fluid overload: Secondary | ICD-10-CM | POA: Diagnosis not present

## 2015-05-12 DIAGNOSIS — D688 Other specified coagulation defects: Secondary | ICD-10-CM | POA: Diagnosis not present

## 2015-05-12 DIAGNOSIS — D509 Iron deficiency anemia, unspecified: Secondary | ICD-10-CM | POA: Diagnosis not present

## 2015-05-12 DIAGNOSIS — N186 End stage renal disease: Secondary | ICD-10-CM | POA: Diagnosis not present

## 2015-05-12 DIAGNOSIS — D631 Anemia in chronic kidney disease: Secondary | ICD-10-CM | POA: Diagnosis not present

## 2015-05-12 DIAGNOSIS — N2581 Secondary hyperparathyroidism of renal origin: Secondary | ICD-10-CM | POA: Diagnosis not present

## 2015-05-14 DIAGNOSIS — N2581 Secondary hyperparathyroidism of renal origin: Secondary | ICD-10-CM | POA: Diagnosis not present

## 2015-05-14 DIAGNOSIS — D509 Iron deficiency anemia, unspecified: Secondary | ICD-10-CM | POA: Diagnosis not present

## 2015-05-14 DIAGNOSIS — N186 End stage renal disease: Secondary | ICD-10-CM | POA: Diagnosis not present

## 2015-05-14 DIAGNOSIS — E8779 Other fluid overload: Secondary | ICD-10-CM | POA: Diagnosis not present

## 2015-05-14 DIAGNOSIS — D631 Anemia in chronic kidney disease: Secondary | ICD-10-CM | POA: Diagnosis not present

## 2015-05-14 DIAGNOSIS — D688 Other specified coagulation defects: Secondary | ICD-10-CM | POA: Diagnosis not present

## 2015-05-16 DIAGNOSIS — D631 Anemia in chronic kidney disease: Secondary | ICD-10-CM | POA: Diagnosis not present

## 2015-05-16 DIAGNOSIS — N2581 Secondary hyperparathyroidism of renal origin: Secondary | ICD-10-CM | POA: Diagnosis not present

## 2015-05-16 DIAGNOSIS — E8779 Other fluid overload: Secondary | ICD-10-CM | POA: Diagnosis not present

## 2015-05-16 DIAGNOSIS — D688 Other specified coagulation defects: Secondary | ICD-10-CM | POA: Diagnosis not present

## 2015-05-16 DIAGNOSIS — N186 End stage renal disease: Secondary | ICD-10-CM | POA: Diagnosis not present

## 2015-05-16 DIAGNOSIS — D509 Iron deficiency anemia, unspecified: Secondary | ICD-10-CM | POA: Diagnosis not present

## 2015-05-19 DIAGNOSIS — D509 Iron deficiency anemia, unspecified: Secondary | ICD-10-CM | POA: Diagnosis not present

## 2015-05-19 DIAGNOSIS — D631 Anemia in chronic kidney disease: Secondary | ICD-10-CM | POA: Diagnosis not present

## 2015-05-19 DIAGNOSIS — N186 End stage renal disease: Secondary | ICD-10-CM | POA: Diagnosis not present

## 2015-05-19 DIAGNOSIS — E8779 Other fluid overload: Secondary | ICD-10-CM | POA: Diagnosis not present

## 2015-05-19 DIAGNOSIS — N2581 Secondary hyperparathyroidism of renal origin: Secondary | ICD-10-CM | POA: Diagnosis not present

## 2015-05-19 DIAGNOSIS — D688 Other specified coagulation defects: Secondary | ICD-10-CM | POA: Diagnosis not present

## 2015-05-21 DIAGNOSIS — D509 Iron deficiency anemia, unspecified: Secondary | ICD-10-CM | POA: Diagnosis not present

## 2015-05-21 DIAGNOSIS — D688 Other specified coagulation defects: Secondary | ICD-10-CM | POA: Diagnosis not present

## 2015-05-21 DIAGNOSIS — E8779 Other fluid overload: Secondary | ICD-10-CM | POA: Diagnosis not present

## 2015-05-21 DIAGNOSIS — N2581 Secondary hyperparathyroidism of renal origin: Secondary | ICD-10-CM | POA: Diagnosis not present

## 2015-05-21 DIAGNOSIS — D631 Anemia in chronic kidney disease: Secondary | ICD-10-CM | POA: Diagnosis not present

## 2015-05-21 DIAGNOSIS — N186 End stage renal disease: Secondary | ICD-10-CM | POA: Diagnosis not present

## 2015-05-23 DIAGNOSIS — N2581 Secondary hyperparathyroidism of renal origin: Secondary | ICD-10-CM | POA: Diagnosis not present

## 2015-05-23 DIAGNOSIS — D688 Other specified coagulation defects: Secondary | ICD-10-CM | POA: Diagnosis not present

## 2015-05-23 DIAGNOSIS — D631 Anemia in chronic kidney disease: Secondary | ICD-10-CM | POA: Diagnosis not present

## 2015-05-23 DIAGNOSIS — N186 End stage renal disease: Secondary | ICD-10-CM | POA: Diagnosis not present

## 2015-05-23 DIAGNOSIS — D509 Iron deficiency anemia, unspecified: Secondary | ICD-10-CM | POA: Diagnosis not present

## 2015-05-23 DIAGNOSIS — E8779 Other fluid overload: Secondary | ICD-10-CM | POA: Diagnosis not present

## 2015-05-26 DIAGNOSIS — E8779 Other fluid overload: Secondary | ICD-10-CM | POA: Diagnosis not present

## 2015-05-26 DIAGNOSIS — D509 Iron deficiency anemia, unspecified: Secondary | ICD-10-CM | POA: Diagnosis not present

## 2015-05-26 DIAGNOSIS — N186 End stage renal disease: Secondary | ICD-10-CM | POA: Diagnosis not present

## 2015-05-26 DIAGNOSIS — D688 Other specified coagulation defects: Secondary | ICD-10-CM | POA: Diagnosis not present

## 2015-05-26 DIAGNOSIS — D631 Anemia in chronic kidney disease: Secondary | ICD-10-CM | POA: Diagnosis not present

## 2015-05-26 DIAGNOSIS — N2581 Secondary hyperparathyroidism of renal origin: Secondary | ICD-10-CM | POA: Diagnosis not present

## 2015-05-28 DIAGNOSIS — D688 Other specified coagulation defects: Secondary | ICD-10-CM | POA: Diagnosis not present

## 2015-05-28 DIAGNOSIS — N2581 Secondary hyperparathyroidism of renal origin: Secondary | ICD-10-CM | POA: Diagnosis not present

## 2015-05-28 DIAGNOSIS — D631 Anemia in chronic kidney disease: Secondary | ICD-10-CM | POA: Diagnosis not present

## 2015-05-28 DIAGNOSIS — D509 Iron deficiency anemia, unspecified: Secondary | ICD-10-CM | POA: Diagnosis not present

## 2015-05-28 DIAGNOSIS — E8779 Other fluid overload: Secondary | ICD-10-CM | POA: Diagnosis not present

## 2015-05-28 DIAGNOSIS — N186 End stage renal disease: Secondary | ICD-10-CM | POA: Diagnosis not present

## 2015-05-30 DIAGNOSIS — D688 Other specified coagulation defects: Secondary | ICD-10-CM | POA: Diagnosis not present

## 2015-05-30 DIAGNOSIS — E8779 Other fluid overload: Secondary | ICD-10-CM | POA: Diagnosis not present

## 2015-05-30 DIAGNOSIS — D509 Iron deficiency anemia, unspecified: Secondary | ICD-10-CM | POA: Diagnosis not present

## 2015-05-30 DIAGNOSIS — N186 End stage renal disease: Secondary | ICD-10-CM | POA: Diagnosis not present

## 2015-05-30 DIAGNOSIS — N2581 Secondary hyperparathyroidism of renal origin: Secondary | ICD-10-CM | POA: Diagnosis not present

## 2015-05-30 DIAGNOSIS — D631 Anemia in chronic kidney disease: Secondary | ICD-10-CM | POA: Diagnosis not present

## 2015-06-02 DIAGNOSIS — E8779 Other fluid overload: Secondary | ICD-10-CM | POA: Diagnosis not present

## 2015-06-02 DIAGNOSIS — D509 Iron deficiency anemia, unspecified: Secondary | ICD-10-CM | POA: Diagnosis not present

## 2015-06-02 DIAGNOSIS — N186 End stage renal disease: Secondary | ICD-10-CM | POA: Diagnosis not present

## 2015-06-02 DIAGNOSIS — D688 Other specified coagulation defects: Secondary | ICD-10-CM | POA: Diagnosis not present

## 2015-06-02 DIAGNOSIS — N2581 Secondary hyperparathyroidism of renal origin: Secondary | ICD-10-CM | POA: Diagnosis not present

## 2015-06-02 DIAGNOSIS — D631 Anemia in chronic kidney disease: Secondary | ICD-10-CM | POA: Diagnosis not present

## 2015-06-03 DIAGNOSIS — D631 Anemia in chronic kidney disease: Secondary | ICD-10-CM | POA: Diagnosis not present

## 2015-06-03 DIAGNOSIS — D509 Iron deficiency anemia, unspecified: Secondary | ICD-10-CM | POA: Diagnosis not present

## 2015-06-03 DIAGNOSIS — N2581 Secondary hyperparathyroidism of renal origin: Secondary | ICD-10-CM | POA: Diagnosis not present

## 2015-06-03 DIAGNOSIS — D688 Other specified coagulation defects: Secondary | ICD-10-CM | POA: Diagnosis not present

## 2015-06-03 DIAGNOSIS — N186 End stage renal disease: Secondary | ICD-10-CM | POA: Diagnosis not present

## 2015-06-03 DIAGNOSIS — E8779 Other fluid overload: Secondary | ICD-10-CM | POA: Diagnosis not present

## 2015-06-05 DIAGNOSIS — N186 End stage renal disease: Secondary | ICD-10-CM | POA: Diagnosis not present

## 2015-06-05 DIAGNOSIS — N2581 Secondary hyperparathyroidism of renal origin: Secondary | ICD-10-CM | POA: Diagnosis not present

## 2015-06-05 DIAGNOSIS — E8779 Other fluid overload: Secondary | ICD-10-CM | POA: Diagnosis not present

## 2015-06-05 DIAGNOSIS — D509 Iron deficiency anemia, unspecified: Secondary | ICD-10-CM | POA: Diagnosis not present

## 2015-06-05 DIAGNOSIS — D631 Anemia in chronic kidney disease: Secondary | ICD-10-CM | POA: Diagnosis not present

## 2015-06-05 DIAGNOSIS — D688 Other specified coagulation defects: Secondary | ICD-10-CM | POA: Diagnosis not present

## 2015-06-07 DIAGNOSIS — D688 Other specified coagulation defects: Secondary | ICD-10-CM | POA: Diagnosis not present

## 2015-06-07 DIAGNOSIS — N2581 Secondary hyperparathyroidism of renal origin: Secondary | ICD-10-CM | POA: Diagnosis not present

## 2015-06-07 DIAGNOSIS — D631 Anemia in chronic kidney disease: Secondary | ICD-10-CM | POA: Diagnosis not present

## 2015-06-07 DIAGNOSIS — E8779 Other fluid overload: Secondary | ICD-10-CM | POA: Diagnosis not present

## 2015-06-07 DIAGNOSIS — D509 Iron deficiency anemia, unspecified: Secondary | ICD-10-CM | POA: Diagnosis not present

## 2015-06-07 DIAGNOSIS — N186 End stage renal disease: Secondary | ICD-10-CM | POA: Diagnosis not present

## 2015-06-09 DIAGNOSIS — N186 End stage renal disease: Secondary | ICD-10-CM | POA: Diagnosis not present

## 2015-06-09 DIAGNOSIS — N2581 Secondary hyperparathyroidism of renal origin: Secondary | ICD-10-CM | POA: Diagnosis not present

## 2015-06-09 DIAGNOSIS — D688 Other specified coagulation defects: Secondary | ICD-10-CM | POA: Diagnosis not present

## 2015-06-09 DIAGNOSIS — D631 Anemia in chronic kidney disease: Secondary | ICD-10-CM | POA: Diagnosis not present

## 2015-06-09 DIAGNOSIS — E8779 Other fluid overload: Secondary | ICD-10-CM | POA: Diagnosis not present

## 2015-06-09 DIAGNOSIS — D509 Iron deficiency anemia, unspecified: Secondary | ICD-10-CM | POA: Diagnosis not present

## 2015-06-10 DIAGNOSIS — N186 End stage renal disease: Secondary | ICD-10-CM | POA: Diagnosis not present

## 2015-06-10 DIAGNOSIS — I129 Hypertensive chronic kidney disease with stage 1 through stage 4 chronic kidney disease, or unspecified chronic kidney disease: Secondary | ICD-10-CM | POA: Diagnosis not present

## 2015-06-10 DIAGNOSIS — Z992 Dependence on renal dialysis: Secondary | ICD-10-CM | POA: Diagnosis not present

## 2015-06-11 DIAGNOSIS — N2581 Secondary hyperparathyroidism of renal origin: Secondary | ICD-10-CM | POA: Diagnosis not present

## 2015-06-11 DIAGNOSIS — Z23 Encounter for immunization: Secondary | ICD-10-CM | POA: Diagnosis not present

## 2015-06-11 DIAGNOSIS — D631 Anemia in chronic kidney disease: Secondary | ICD-10-CM | POA: Diagnosis not present

## 2015-06-11 DIAGNOSIS — D688 Other specified coagulation defects: Secondary | ICD-10-CM | POA: Diagnosis not present

## 2015-06-11 DIAGNOSIS — N186 End stage renal disease: Secondary | ICD-10-CM | POA: Diagnosis not present

## 2015-06-11 DIAGNOSIS — D509 Iron deficiency anemia, unspecified: Secondary | ICD-10-CM | POA: Diagnosis not present

## 2015-06-13 DIAGNOSIS — D688 Other specified coagulation defects: Secondary | ICD-10-CM | POA: Diagnosis not present

## 2015-06-13 DIAGNOSIS — N186 End stage renal disease: Secondary | ICD-10-CM | POA: Diagnosis not present

## 2015-06-13 DIAGNOSIS — D509 Iron deficiency anemia, unspecified: Secondary | ICD-10-CM | POA: Diagnosis not present

## 2015-06-13 DIAGNOSIS — N2581 Secondary hyperparathyroidism of renal origin: Secondary | ICD-10-CM | POA: Diagnosis not present

## 2015-06-13 DIAGNOSIS — Z23 Encounter for immunization: Secondary | ICD-10-CM | POA: Diagnosis not present

## 2015-06-13 DIAGNOSIS — D631 Anemia in chronic kidney disease: Secondary | ICD-10-CM | POA: Diagnosis not present

## 2015-06-16 DIAGNOSIS — D631 Anemia in chronic kidney disease: Secondary | ICD-10-CM | POA: Diagnosis not present

## 2015-06-16 DIAGNOSIS — N186 End stage renal disease: Secondary | ICD-10-CM | POA: Diagnosis not present

## 2015-06-16 DIAGNOSIS — D509 Iron deficiency anemia, unspecified: Secondary | ICD-10-CM | POA: Diagnosis not present

## 2015-06-16 DIAGNOSIS — N2581 Secondary hyperparathyroidism of renal origin: Secondary | ICD-10-CM | POA: Diagnosis not present

## 2015-06-16 DIAGNOSIS — Z23 Encounter for immunization: Secondary | ICD-10-CM | POA: Diagnosis not present

## 2015-06-16 DIAGNOSIS — D688 Other specified coagulation defects: Secondary | ICD-10-CM | POA: Diagnosis not present

## 2015-06-18 DIAGNOSIS — N2581 Secondary hyperparathyroidism of renal origin: Secondary | ICD-10-CM | POA: Diagnosis not present

## 2015-06-18 DIAGNOSIS — D509 Iron deficiency anemia, unspecified: Secondary | ICD-10-CM | POA: Diagnosis not present

## 2015-06-18 DIAGNOSIS — D631 Anemia in chronic kidney disease: Secondary | ICD-10-CM | POA: Diagnosis not present

## 2015-06-18 DIAGNOSIS — D688 Other specified coagulation defects: Secondary | ICD-10-CM | POA: Diagnosis not present

## 2015-06-18 DIAGNOSIS — N186 End stage renal disease: Secondary | ICD-10-CM | POA: Diagnosis not present

## 2015-06-18 DIAGNOSIS — Z23 Encounter for immunization: Secondary | ICD-10-CM | POA: Diagnosis not present

## 2015-06-20 DIAGNOSIS — Z23 Encounter for immunization: Secondary | ICD-10-CM | POA: Diagnosis not present

## 2015-06-20 DIAGNOSIS — N2581 Secondary hyperparathyroidism of renal origin: Secondary | ICD-10-CM | POA: Diagnosis not present

## 2015-06-20 DIAGNOSIS — D509 Iron deficiency anemia, unspecified: Secondary | ICD-10-CM | POA: Diagnosis not present

## 2015-06-20 DIAGNOSIS — N186 End stage renal disease: Secondary | ICD-10-CM | POA: Diagnosis not present

## 2015-06-20 DIAGNOSIS — D631 Anemia in chronic kidney disease: Secondary | ICD-10-CM | POA: Diagnosis not present

## 2015-06-20 DIAGNOSIS — D688 Other specified coagulation defects: Secondary | ICD-10-CM | POA: Diagnosis not present

## 2015-06-22 DIAGNOSIS — M1A9XX0 Chronic gout, unspecified, without tophus (tophi): Secondary | ICD-10-CM | POA: Diagnosis not present

## 2015-06-22 DIAGNOSIS — M179 Osteoarthritis of knee, unspecified: Secondary | ICD-10-CM | POA: Diagnosis not present

## 2015-06-22 DIAGNOSIS — N186 End stage renal disease: Secondary | ICD-10-CM | POA: Diagnosis not present

## 2015-06-22 DIAGNOSIS — J4520 Mild intermittent asthma, uncomplicated: Secondary | ICD-10-CM | POA: Diagnosis not present

## 2015-06-22 DIAGNOSIS — I10 Essential (primary) hypertension: Secondary | ICD-10-CM | POA: Diagnosis not present

## 2015-06-23 DIAGNOSIS — N2581 Secondary hyperparathyroidism of renal origin: Secondary | ICD-10-CM | POA: Diagnosis not present

## 2015-06-23 DIAGNOSIS — D631 Anemia in chronic kidney disease: Secondary | ICD-10-CM | POA: Diagnosis not present

## 2015-06-23 DIAGNOSIS — Z23 Encounter for immunization: Secondary | ICD-10-CM | POA: Diagnosis not present

## 2015-06-23 DIAGNOSIS — N186 End stage renal disease: Secondary | ICD-10-CM | POA: Diagnosis not present

## 2015-06-23 DIAGNOSIS — D688 Other specified coagulation defects: Secondary | ICD-10-CM | POA: Diagnosis not present

## 2015-06-23 DIAGNOSIS — D509 Iron deficiency anemia, unspecified: Secondary | ICD-10-CM | POA: Diagnosis not present

## 2015-06-24 ENCOUNTER — Other Ambulatory Visit: Payer: Self-pay | Admitting: Internal Medicine

## 2015-06-24 DIAGNOSIS — E2839 Other primary ovarian failure: Secondary | ICD-10-CM

## 2015-06-25 DIAGNOSIS — Z23 Encounter for immunization: Secondary | ICD-10-CM | POA: Diagnosis not present

## 2015-06-25 DIAGNOSIS — D509 Iron deficiency anemia, unspecified: Secondary | ICD-10-CM | POA: Diagnosis not present

## 2015-06-25 DIAGNOSIS — D631 Anemia in chronic kidney disease: Secondary | ICD-10-CM | POA: Diagnosis not present

## 2015-06-25 DIAGNOSIS — N2581 Secondary hyperparathyroidism of renal origin: Secondary | ICD-10-CM | POA: Diagnosis not present

## 2015-06-25 DIAGNOSIS — N186 End stage renal disease: Secondary | ICD-10-CM | POA: Diagnosis not present

## 2015-06-25 DIAGNOSIS — D688 Other specified coagulation defects: Secondary | ICD-10-CM | POA: Diagnosis not present

## 2015-06-27 DIAGNOSIS — Z23 Encounter for immunization: Secondary | ICD-10-CM | POA: Diagnosis not present

## 2015-06-27 DIAGNOSIS — N2581 Secondary hyperparathyroidism of renal origin: Secondary | ICD-10-CM | POA: Diagnosis not present

## 2015-06-27 DIAGNOSIS — D631 Anemia in chronic kidney disease: Secondary | ICD-10-CM | POA: Diagnosis not present

## 2015-06-27 DIAGNOSIS — D688 Other specified coagulation defects: Secondary | ICD-10-CM | POA: Diagnosis not present

## 2015-06-27 DIAGNOSIS — N186 End stage renal disease: Secondary | ICD-10-CM | POA: Diagnosis not present

## 2015-06-27 DIAGNOSIS — D509 Iron deficiency anemia, unspecified: Secondary | ICD-10-CM | POA: Diagnosis not present

## 2015-06-30 DIAGNOSIS — N2581 Secondary hyperparathyroidism of renal origin: Secondary | ICD-10-CM | POA: Diagnosis not present

## 2015-06-30 DIAGNOSIS — Z23 Encounter for immunization: Secondary | ICD-10-CM | POA: Diagnosis not present

## 2015-06-30 DIAGNOSIS — D509 Iron deficiency anemia, unspecified: Secondary | ICD-10-CM | POA: Diagnosis not present

## 2015-06-30 DIAGNOSIS — D631 Anemia in chronic kidney disease: Secondary | ICD-10-CM | POA: Diagnosis not present

## 2015-06-30 DIAGNOSIS — D688 Other specified coagulation defects: Secondary | ICD-10-CM | POA: Diagnosis not present

## 2015-06-30 DIAGNOSIS — N186 End stage renal disease: Secondary | ICD-10-CM | POA: Diagnosis not present

## 2015-07-02 DIAGNOSIS — N2581 Secondary hyperparathyroidism of renal origin: Secondary | ICD-10-CM | POA: Diagnosis not present

## 2015-07-02 DIAGNOSIS — Z23 Encounter for immunization: Secondary | ICD-10-CM | POA: Diagnosis not present

## 2015-07-02 DIAGNOSIS — N186 End stage renal disease: Secondary | ICD-10-CM | POA: Diagnosis not present

## 2015-07-02 DIAGNOSIS — D688 Other specified coagulation defects: Secondary | ICD-10-CM | POA: Diagnosis not present

## 2015-07-02 DIAGNOSIS — D509 Iron deficiency anemia, unspecified: Secondary | ICD-10-CM | POA: Diagnosis not present

## 2015-07-02 DIAGNOSIS — D631 Anemia in chronic kidney disease: Secondary | ICD-10-CM | POA: Diagnosis not present

## 2015-07-04 DIAGNOSIS — D631 Anemia in chronic kidney disease: Secondary | ICD-10-CM | POA: Diagnosis not present

## 2015-07-04 DIAGNOSIS — Z23 Encounter for immunization: Secondary | ICD-10-CM | POA: Diagnosis not present

## 2015-07-04 DIAGNOSIS — N2581 Secondary hyperparathyroidism of renal origin: Secondary | ICD-10-CM | POA: Diagnosis not present

## 2015-07-04 DIAGNOSIS — D509 Iron deficiency anemia, unspecified: Secondary | ICD-10-CM | POA: Diagnosis not present

## 2015-07-04 DIAGNOSIS — D688 Other specified coagulation defects: Secondary | ICD-10-CM | POA: Diagnosis not present

## 2015-07-04 DIAGNOSIS — N186 End stage renal disease: Secondary | ICD-10-CM | POA: Diagnosis not present

## 2015-07-06 IMAGING — CT CT ABD-PELV W/O CM
2 of 4 series · 16 of 46 positions shown, 18 images · non-contrast
Comparison: None.

CLINICAL DATA: Nausea, vomiting, diarrhea for 3 days. Lower
abdominal pain.

EXAM:
CT ABDOMEN AND PELVIS WITHOUT CONTRAST
TECHNIQUE: Multidetector CT imaging of the abdomen and pelvis was performed
following the standard protocol without IV contrast.

[Series 2: abd/ pelvis 5.0 i30f 1 · axial · 0.72mm/px · z∈[+1002,+1362]mm · 13 of 79 slices shown, 15 images]
[im 4/79  soft-tissue]
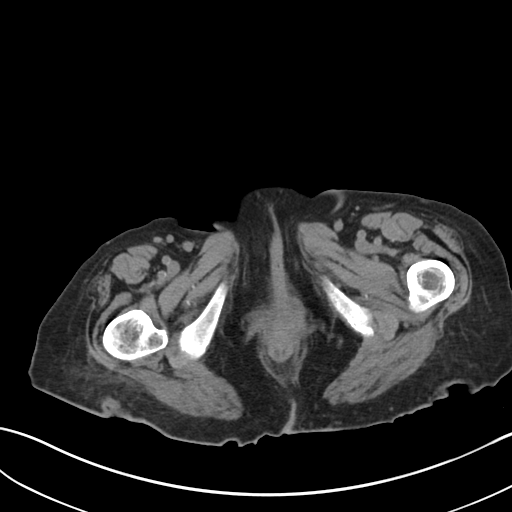
[im 4/79  bone]
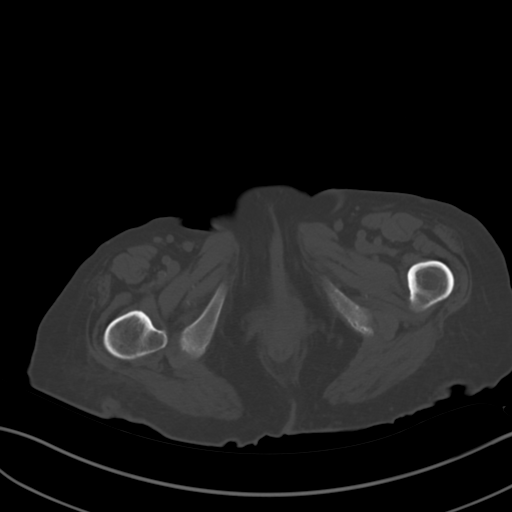
[im 10/79  soft-tissue]
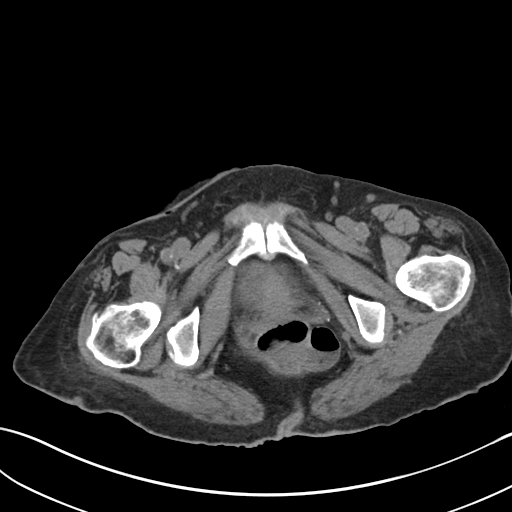
[im 16/79  soft-tissue]
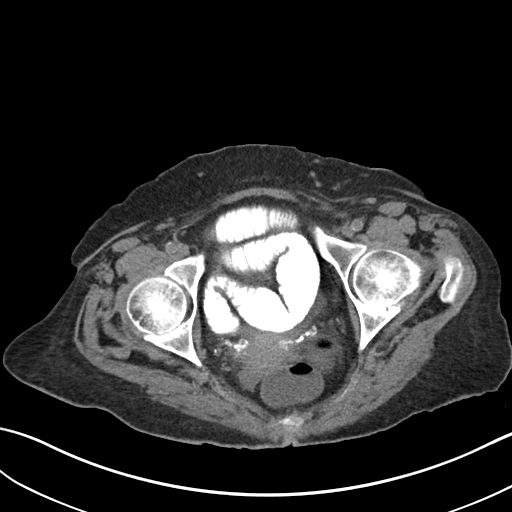
[im 22/79  soft-tissue]
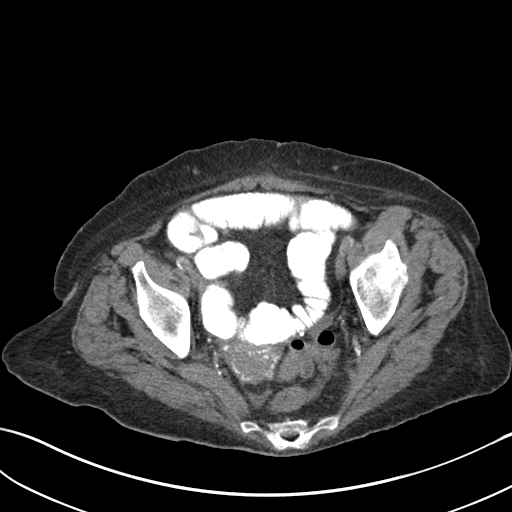
[im 28/79  soft-tissue]
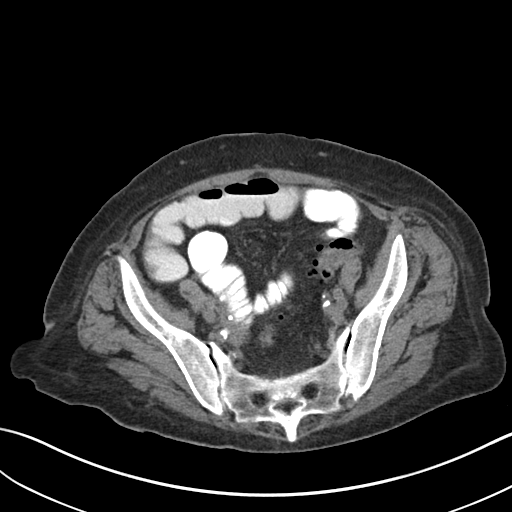
[im 34/79  soft-tissue]
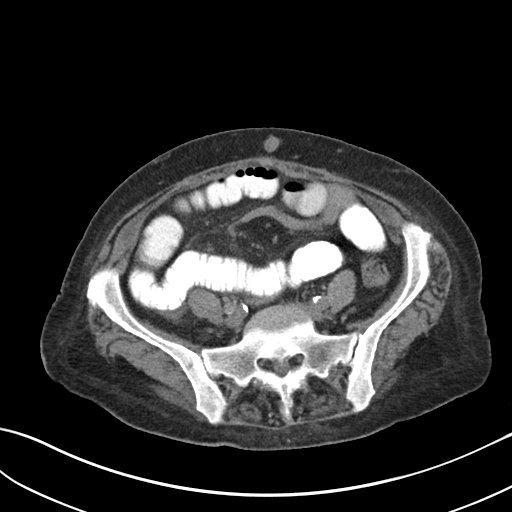
[im 40/79  soft-tissue]
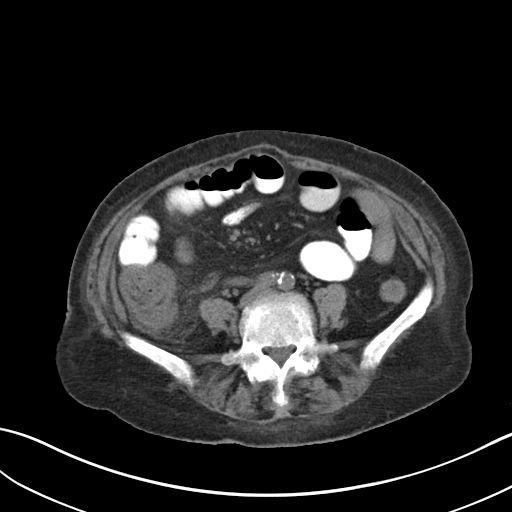
[im 46/79  soft-tissue]
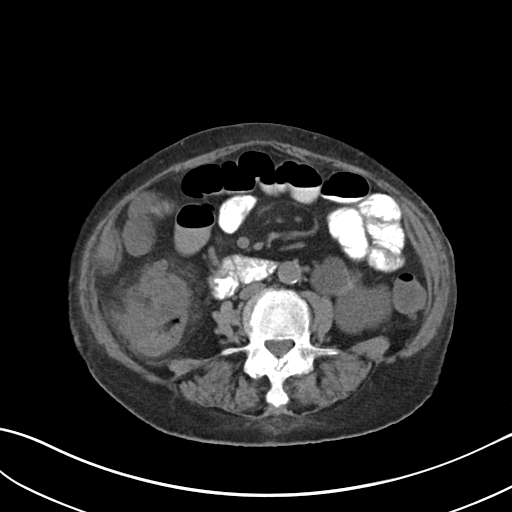
[im 52/79  soft-tissue]
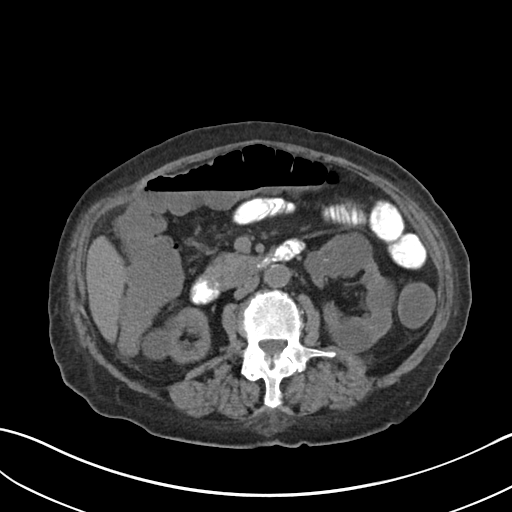
[im 52/79  bone]
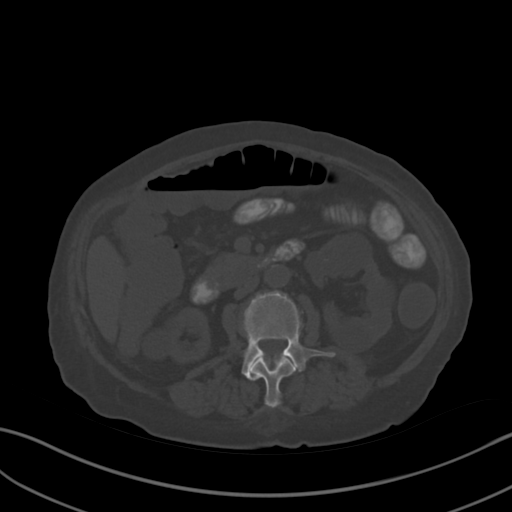
[im 58/79  soft-tissue]
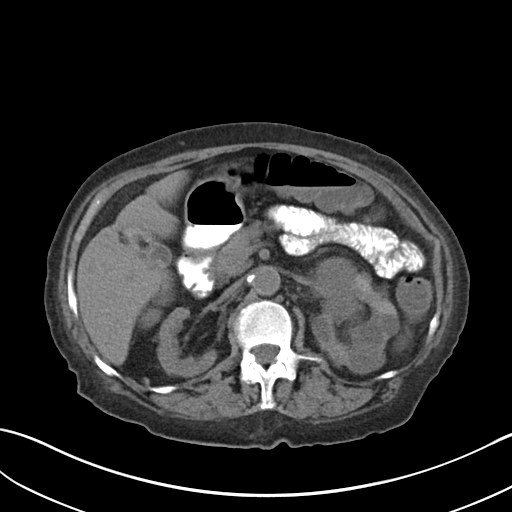
[im 64/79  soft-tissue]
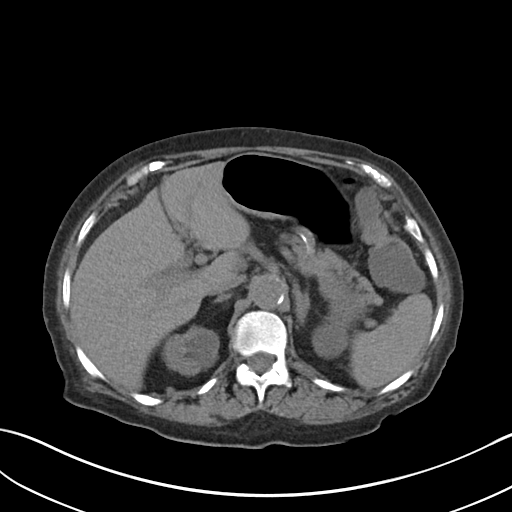
[im 70/79  soft-tissue]
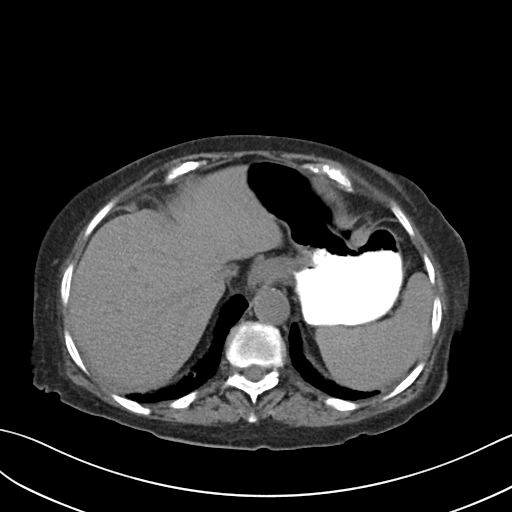
[im 76/79  soft-tissue]
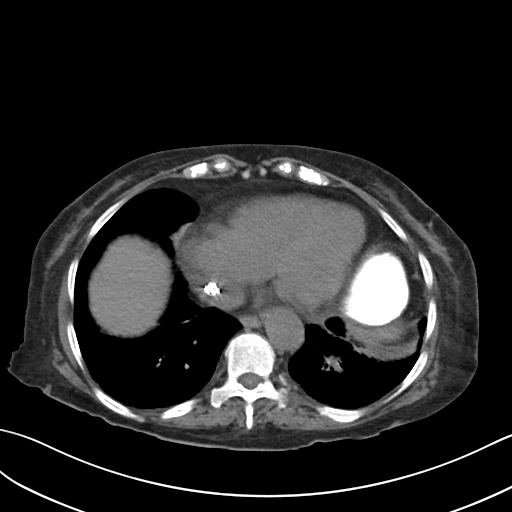

[Series 4: cor st · coronal · 0.68mm/px · 3 of 115 slices shown]
[im 39/115  soft-tissue]
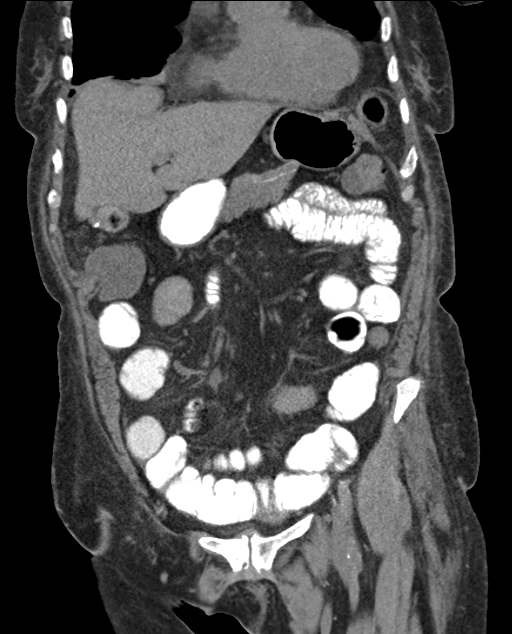
[im 51/115  soft-tissue]
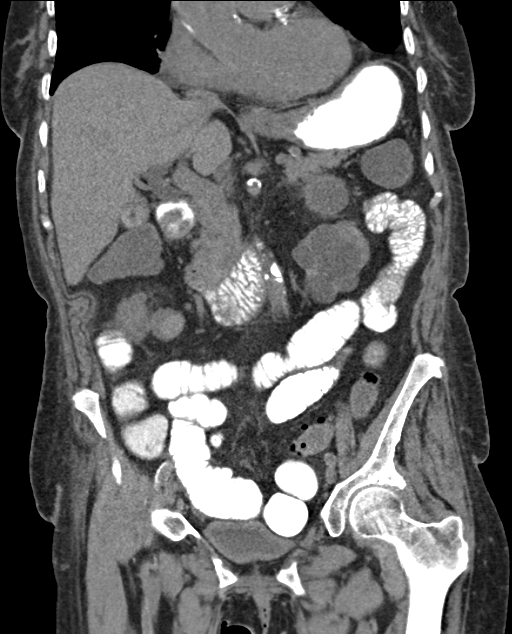
[im 64/115  soft-tissue]
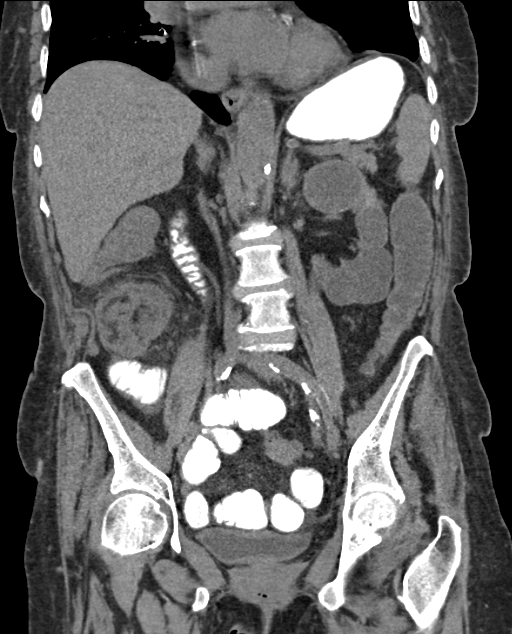

[16 of 46 positions shown; findings below may reference images not displayed]

FINDINGS: There is stable cardiomegaly.  There is left basilar fibrosis.

No renal, ureteral, or bladder calculi. No obstructive uropathy. No
perinephric stranding is seen. There is a multi-cystic left kidney
with the largest measuring 3.5 x 5.2 cm. The right kidney is
atrophic. There is a 2.2 x 2.6 cm hypodense, fluid attenuating renal
mass most consistent with a cyst. There is a punctate cortical
calcification in the right kidney. The bladder is unremarkable.

The liver demonstrates no focal abnormality. There is
cholelithiasis. The spleen demonstrates no focal abnormality. The
adrenal glands and pancreas are normal.

There is cecal and ascending colon bowel wall thickening and
surrounding inflammatory changes most severe at the cecum. There is
mild small bowel dilatation with a few air-fluid levels. There are
air-fluid levels throughout the colon consistent with diarrhea.
There is diverticulosis without evidence of diverticulitis. There is
no pneumoperitoneum, pneumatosis, or portal venous gas. There is a
small amount of pelvic free fluid. There is no lymphadenopathy.

The abdominal aorta is normal in caliber with atherosclerosis.

There are mild degenerative changes of bilateral SI joints. There is
lumbar spine spondylosis.
IMPRESSION: 1. Ascending colitis which may be secondary to an infectious versus
inflammatory versus ischemic etiology. There is mild small bowel
dilatation with multiple air-fluid levels likely reflecting an
ileus.
2. Cholelithiasis.
3. Multicystic left kidney.  Right renal cyst.

## 2015-07-07 DIAGNOSIS — Z23 Encounter for immunization: Secondary | ICD-10-CM | POA: Diagnosis not present

## 2015-07-07 DIAGNOSIS — N186 End stage renal disease: Secondary | ICD-10-CM | POA: Diagnosis not present

## 2015-07-07 DIAGNOSIS — D688 Other specified coagulation defects: Secondary | ICD-10-CM | POA: Diagnosis not present

## 2015-07-07 DIAGNOSIS — N2581 Secondary hyperparathyroidism of renal origin: Secondary | ICD-10-CM | POA: Diagnosis not present

## 2015-07-07 DIAGNOSIS — D631 Anemia in chronic kidney disease: Secondary | ICD-10-CM | POA: Diagnosis not present

## 2015-07-07 DIAGNOSIS — D509 Iron deficiency anemia, unspecified: Secondary | ICD-10-CM | POA: Diagnosis not present

## 2015-07-09 DIAGNOSIS — Z23 Encounter for immunization: Secondary | ICD-10-CM | POA: Diagnosis not present

## 2015-07-09 DIAGNOSIS — N186 End stage renal disease: Secondary | ICD-10-CM | POA: Diagnosis not present

## 2015-07-09 DIAGNOSIS — D688 Other specified coagulation defects: Secondary | ICD-10-CM | POA: Diagnosis not present

## 2015-07-09 DIAGNOSIS — D509 Iron deficiency anemia, unspecified: Secondary | ICD-10-CM | POA: Diagnosis not present

## 2015-07-09 DIAGNOSIS — N2581 Secondary hyperparathyroidism of renal origin: Secondary | ICD-10-CM | POA: Diagnosis not present

## 2015-07-09 DIAGNOSIS — D631 Anemia in chronic kidney disease: Secondary | ICD-10-CM | POA: Diagnosis not present

## 2015-07-11 DIAGNOSIS — D631 Anemia in chronic kidney disease: Secondary | ICD-10-CM | POA: Diagnosis not present

## 2015-07-11 DIAGNOSIS — D688 Other specified coagulation defects: Secondary | ICD-10-CM | POA: Diagnosis not present

## 2015-07-11 DIAGNOSIS — N186 End stage renal disease: Secondary | ICD-10-CM | POA: Diagnosis not present

## 2015-07-11 DIAGNOSIS — Z23 Encounter for immunization: Secondary | ICD-10-CM | POA: Diagnosis not present

## 2015-07-11 DIAGNOSIS — N2581 Secondary hyperparathyroidism of renal origin: Secondary | ICD-10-CM | POA: Diagnosis not present

## 2015-07-11 DIAGNOSIS — D509 Iron deficiency anemia, unspecified: Secondary | ICD-10-CM | POA: Diagnosis not present

## 2015-07-11 DIAGNOSIS — Z992 Dependence on renal dialysis: Secondary | ICD-10-CM | POA: Diagnosis not present

## 2015-07-11 DIAGNOSIS — I129 Hypertensive chronic kidney disease with stage 1 through stage 4 chronic kidney disease, or unspecified chronic kidney disease: Secondary | ICD-10-CM | POA: Diagnosis not present

## 2015-07-31 ENCOUNTER — Ambulatory Visit
Admission: RE | Admit: 2015-07-31 | Discharge: 2015-07-31 | Disposition: A | Discharge status: Home / Self Care | Payer: Medicare Other | Source: Ambulatory Visit | Attending: Internal Medicine | Admitting: Internal Medicine

## 2015-07-31 DIAGNOSIS — E2839 Other primary ovarian failure: Secondary | ICD-10-CM

## 2015-08-25 ENCOUNTER — Ambulatory Visit (INDEPENDENT_AMBULATORY_CARE_PROVIDER_SITE_OTHER): Payer: Medicare Other | Admitting: Vascular Surgery

## 2015-08-25 ENCOUNTER — Encounter: Payer: Self-pay | Admitting: Vascular Surgery

## 2015-08-25 ENCOUNTER — Other Ambulatory Visit: Payer: Self-pay

## 2015-08-25 VITALS — BP 88/59 | HR 83 | Temp 97.0°F | Resp 18 | Ht 62.0 in | Wt 159.3 lb

## 2015-08-25 DIAGNOSIS — Z992 Dependence on renal dialysis: Secondary | ICD-10-CM | POA: Diagnosis not present

## 2015-08-25 DIAGNOSIS — N186 End stage renal disease: Secondary | ICD-10-CM | POA: Diagnosis not present

## 2015-08-25 NOTE — Progress Notes (Signed)
Vascular and Vein Specialist of [facility]  Patient name: [name] MRN: [id number] DOB: 1933/04/08 Sex: female  REASON FOR VISIT: Eschar over the upper arm AV fistula  HPI: [name] is a 82 y.o. female seen today sent over from the dialysis unit with concern regarding an eschar over her left upper arm AV fistula. The fistula was actually created by myself approximately 8 years ago. She went for a long period of time not being on hemodialysis. She has now been on hemodialysis for the last several years. She does report some chronic discomfort in her left arm but no steal symptoms. She does have some episodes of bleeding around this eschar. There is been no discussion of poor flow  Past Medical History  Diagnosis Date  . Gout   . CKD (chronic kidney disease) stage V requiring chronic dialysis (HCC)   . Hypertension   . Hyperlipemia   . Dementia     Family History  Problem Relation Age of Onset  . Colon cancer Neg Hx   . Diabetes Mellitus II Neg Hx     SOCIAL HISTORY: Social History  Substance Use Topics  . Smoking status: Never Smoker   . Smokeless tobacco: Never Used  . Alcohol Use: No    Allergies  Allergen Reactions  . Nsaids Other (See Comments)    REACTION: Avoid NSAIDS(renal failure)    Current Outpatient Prescriptions  Medication Sig Dispense Refill  . albuterol (PROAIR HFA) 108 (90 BASE) MCG/ACT inhaler Inhale 2 puffs into the lungs every 6 (six) hours as needed for wheezing or shortness of breath.     Marland Kitchen albuterol (PROVENTIL) (2.5 MG/3ML) 0.083% nebulizer solution Take 3 mLs by nebulization daily as needed.  11  . cinacalcet (SENSIPAR) 30 MG tablet Take 1 tablet (30 mg total) by mouth daily with breakfast. 30 tablet 3  . diazepam (VALIUM) 5 MG tablet Take 0.5-1 tablets every 8 hours as needed for muscle spasm 10 tablet 0  . feeding supplement, RESOURCE BREEZE, (RESOURCE BREEZE) LIQD Take 1 Container by mouth 2 (two) times daily between meals. 30  Container 0  . FLOVENT DISKUS 100 MCG/BLIST AEPB Inhale 2 puffs into the lungs daily as needed.  3  . HYDROcodone-acetaminophen (NORCO/VICODIN) 5-325 MG per tablet Take 1-2 tablets by mouth every 4 (four) hours as needed for moderate pain or severe pain. (Patient taking differently: Take 1 tablet by mouth every 4 (four) hours as needed for moderate pain or severe pain. ) 30 tablet 0  . LORazepam (ATIVAN) 0.5 MG tablet Take 1 tablet (0.5 mg total) by mouth every 12 (twelve) hours as needed for anxiety. Also-may give 1 hour prior to dialysis 30 tablet 0  . midodrine (PROAMATINE) 10 MG tablet Take 1 tablet (10 mg total) by mouth 3 (three) times daily with meals. 90 tablet 3  . multivitamin (RENA-VIT) TABS tablet Take 1 tablet by mouth at bedtime.     . nitroGLYCERIN (NITROSTAT) 0.4 MG SL tablet Place 0.4 mg under the tongue every 5 (five) minutes as needed for chest pain.    . Nutritional Supplements (FEEDING SUPPLEMENT, NEPRO CARB STEADY,) LIQD Take 237 mLs by mouth 2 (two) times daily between meals. (Patient taking differently: Take 237 mLs by mouth daily as needed (feeding supplement). ) 60 Can 3  . guaifenesin (ROBITUSSIN) 100 MG/5ML syrup Take 10 mLs (200 mg total) by mouth every 4 (four) hours while awake. (Patient not taking: Reported on 08/10/2014) 120 mL 0  . traMADol (  ULTRAM) 50 MG tablet Take 1 tablet (50 mg total) by mouth every 6 (six) hours as needed for moderate pain. (Patient not taking: Reported on 08/10/2014) 30 tablet 0  . warfarin (COUMADIN) 4 MG tablet Take 1 tablet (4 mg total) by mouth every evening. (Patient not taking: Reported on 08/25/2015) 30 tablet 0   No current facility-administered medications for this visit.    REVIEW OF SYSTEMS:   denotes positive finding,  denotes negative finding Cardiac  Comments:  Chest pain or chest pressure:    Shortness of breath upon exertion:    Short of breath when lying flat:    Irregular heart rhythm:        Vascular    Pain in  calf, thigh, or hip brought on by ambulation:    Pain in feet at night that wakes you up from your sleep:     Blood clot in your veins:    Leg swelling:         Pulmonary    Oxygen at home:    Productive cough:     Wheezing:         Neurologic    Sudden weakness in arms or legs:     Sudden numbness in arms or legs:     Sudden onset of difficulty speaking or slurred speech:    Temporary loss of vision in one eye:     Problems with dizziness:         Gastrointestinal    Blood in stool:     Vomited blood:         Genitourinary    Burning when urinating:     Blood in urine:        Psychiatric    Major depression:         Hematologic    Bleeding problems:    Problems with blood clotting too easily:        Skin    Rashes or ulcers:        Constitutional    Fever or chills:      PHYSICAL EXAM: Filed Vitals:   08/25/15 1201  BP: 88/59  Pulse: 83  Temp: 97 F (36.1 C)  TempSrc: Oral  Resp: 18  Height:  (1.575 m)  Weight: 159 lb 4.8 oz (72.258 kg)  SpO2: 100%    GENERAL: The patient is a well-nourished female, in no acute distress. The vital signs are documented above. VASCULAR: Absent radial pulses bilaterally PULMONARY: There is good air exchange   MUSCULOSKELETAL: There are no major deformities or cyanosis. NEUROLOGIC: No focal weakness or paresthesias are detected. SKIN: There are no ulcers or rashes noted. PSYCHIATRIC: The patient has a normal affect. Her access is noted for a very large vein size with some tortuosity. She has an excellent thrill. It looks as though she is mainly had the access at 2 sites on the fistula. The area closest to the antecubital space has a 1 cm eschar that is partially separated. There is no active bleeding. For hemodialysis and has a bleeding from one of the puncture site which is easily controlled with the slight pressure and a 2 x 2.   MEDICAL ISSUES: Discussed the issue of the separating eschar with the patient and her  family present. Explained this is a high risk for serious bleeding. Have recommended revision of this. I offered tomorrow. Her family members unable to get off work tomorrow but states that her next nondialysis day would be Friday  and this would be satisfactory. I feel that this is safe to plan this. Also explained what to do if she should have any bleeding from this. We will plan on excision of the eschar and possible control and repair of the vein beneath this. Will be done as an outpatient on 08/28/2015 No Follow-up on file.   Gretta Began [facility]: [phone]

## 2015-08-27 ENCOUNTER — Encounter (HOSPITAL_COMMUNITY): Payer: Self-pay | Admitting: *Deleted

## 2015-08-27 MED ORDER — CHLORHEXIDINE GLUCONATE CLOTH 2 % EX PADS: 6.0000 | MEDICATED_PAD | Freq: Once | CUTANEOUS | Status: DC

## 2015-08-27 MED ORDER — DEXTROSE 5 % IV SOLN
1.5000 g | INTRAVENOUS | Status: AC
  Administered 2015-08-28: 1.5 g via INTRAVENOUS
  Filled 2015-08-27: qty 1.5

## 2015-08-27 MED ORDER — SODIUM CHLORIDE 0.9 % IV SOLN
INTRAVENOUS | Status: DC
  Administered 2015-08-28: 08:00:00 via INTRAVENOUS

## 2015-08-27 NOTE — Progress Notes (Signed)
Anesthesia Chart Review: SAME DAY WORK-UP.  Patient is a 82 year old female scheduled for revision of LUE AVF tomorrow by Dr. Hart Rochester. She in on hemodialysis (TTS).  History includes non-smoker, gout, HTN (now with hypotension, on midodrine), ESRD, HLD, dementia, LLE DVT 06/06/14. PCP is Dr. Fleet Contras.  04/28/15 EKG: SR, incomplete RBBB, LAFB, probable anterior infarct (age undetermined). Overall, I think her EKG is stable when compared to 07/29/14 tracing.   06/05/14 Echo: Study Conclusions - Left ventricle: The cavity size was normal. There was mild concentric hypertrophy. Systolic function was normal. Wall motion was normal; there were no regional wall motion abnormalities. Doppler parameters are consistent with abnormal left ventricular relaxation (grade 1 diastolic dysfunction). There was no evidence of elevated ventricular filling pressure by Doppler parameters. - Aortic valve: Trileaflet; mildly thickened, mildly calcified leaflets. There was no regurgitation. - Aortic root: The aortic root was normal in size. - Mitral valve: There was no regurgitation. - Right ventricle: Systolic function was normal. - Right atrium: The atrium was normal in size. - Tricuspid valve: There was mild regurgitation. - Pulmonic valve: There was no regurgitation. - Pulmonary arteries: Systolic pressure was within the normal range. - Inferior vena cava: The vessel was normal in size. - Pericardium, extracardiac: There was no pericardial effusion.  She is for labs on arrival. Further evaluation by her anesthesiologist at that time to ensure no acute changes prior to proceeding.  Velna Ochs [facility] Short Stay Center/Anesthesiology Phone [phone] 08/27/2015 4:45 PM

## 2015-08-27 NOTE — Progress Notes (Signed)
Pt denies cardiac history, chest pain or sob. 

## 2015-08-28 ENCOUNTER — Ambulatory Visit (HOSPITAL_COMMUNITY)
Admission: RE | Admit: 2015-08-28 | Discharge: 2015-08-28 | Disposition: A | Discharge status: Home / Self Care | Payer: Medicare Other | Source: Ambulatory Visit | Attending: Vascular Surgery | Admitting: Vascular Surgery

## 2015-08-28 ENCOUNTER — Encounter (HOSPITAL_COMMUNITY)
Admission: RE | Disposition: A | Discharge status: Home / Self Care | Payer: Self-pay | Source: Ambulatory Visit | Attending: Vascular Surgery

## 2015-08-28 ENCOUNTER — Encounter (HOSPITAL_COMMUNITY): Payer: Self-pay | Admitting: *Deleted

## 2015-08-28 ENCOUNTER — Ambulatory Visit (HOSPITAL_COMMUNITY): Payer: Medicare Other | Admitting: Vascular Surgery

## 2015-08-28 DIAGNOSIS — E785 Hyperlipidemia, unspecified: Secondary | ICD-10-CM | POA: Insufficient documentation

## 2015-08-28 DIAGNOSIS — T82898A Other specified complication of vascular prosthetic devices, implants and grafts, initial encounter: Secondary | ICD-10-CM | POA: Diagnosis not present

## 2015-08-28 DIAGNOSIS — D649 Anemia, unspecified: Secondary | ICD-10-CM | POA: Insufficient documentation

## 2015-08-28 DIAGNOSIS — F0390 Unspecified dementia without behavioral disturbance: Secondary | ICD-10-CM | POA: Diagnosis not present

## 2015-08-28 DIAGNOSIS — Z7901 Long term (current) use of anticoagulants: Secondary | ICD-10-CM | POA: Diagnosis not present

## 2015-08-28 DIAGNOSIS — Z992 Dependence on renal dialysis: Secondary | ICD-10-CM | POA: Insufficient documentation

## 2015-08-28 DIAGNOSIS — I120 Hypertensive chronic kidney disease with stage 5 chronic kidney disease or end stage renal disease: Secondary | ICD-10-CM | POA: Insufficient documentation

## 2015-08-28 DIAGNOSIS — Z79899 Other long term (current) drug therapy: Secondary | ICD-10-CM | POA: Diagnosis not present

## 2015-08-28 DIAGNOSIS — K219 Gastro-esophageal reflux disease without esophagitis: Secondary | ICD-10-CM | POA: Insufficient documentation

## 2015-08-28 DIAGNOSIS — T82828A Fibrosis of vascular prosthetic devices, implants and grafts, initial encounter: Secondary | ICD-10-CM

## 2015-08-28 DIAGNOSIS — F419 Anxiety disorder, unspecified: Secondary | ICD-10-CM | POA: Insufficient documentation

## 2015-08-28 DIAGNOSIS — N186 End stage renal disease: Secondary | ICD-10-CM | POA: Diagnosis not present

## 2015-08-28 DIAGNOSIS — Z86718 Personal history of other venous thrombosis and embolism: Secondary | ICD-10-CM | POA: Insufficient documentation

## 2015-08-28 DIAGNOSIS — J45909 Unspecified asthma, uncomplicated: Secondary | ICD-10-CM | POA: Diagnosis not present

## 2015-08-28 DIAGNOSIS — Y832 Surgical operation with anastomosis, bypass or graft as the cause of abnormal reaction of the patient, or of later complication, without mention of misadventure at the time of the procedure: Secondary | ICD-10-CM | POA: Diagnosis not present

## 2015-08-28 HISTORY — DX: Unspecified osteoarthritis, unspecified site: M19.90

## 2015-08-28 HISTORY — DX: Acute embolism and thrombosis of unspecified deep veins of unspecified lower extremity: I82.409

## 2015-08-28 HISTORY — DX: Unspecified asthma, uncomplicated: J45.909

## 2015-08-28 HISTORY — PX: REVISON OF ARTERIOVENOUS FISTULA: SHX6074

## 2015-08-28 LAB — POCT I-STAT 4, (NA,K, GLUC, HGB,HCT)
Glucose, Bld: 77 mg/dL (ref 65–99)
HCT: 39 % (ref 36.0–46.0)
HEMOGLOBIN: 13.3 g/dL (ref 12.0–15.0)
POTASSIUM: 3.8 mmol/L (ref 3.5–5.1)
Sodium: 139 mmol/L (ref 135–145)

## 2015-08-28 SURGERY — REVISON OF ARTERIOVENOUS FISTULA
Anesthesia: Monitor Anesthesia Care | Site: Arm Upper | Laterality: Left

## 2015-08-28 MED ORDER — 0.9 % SODIUM CHLORIDE (POUR BTL) OPTIME
TOPICAL | Status: DC | PRN
  Administered 2015-08-28: 1000 mL

## 2015-08-28 MED ORDER — HYDROCODONE-ACETAMINOPHEN 5-325 MG PO TABS: 1.0000 | ORAL_TABLET | ORAL | Status: DC | PRN

## 2015-08-28 MED ORDER — MIDAZOLAM HCL 2 MG/2ML IJ SOLN
INTRAMUSCULAR | Status: AC
  Filled 2015-08-28: qty 2

## 2015-08-28 MED ORDER — FENTANYL CITRATE (PF) 250 MCG/5ML IJ SOLN
INTRAMUSCULAR | Status: AC
  Filled 2015-08-28: qty 5

## 2015-08-28 MED ORDER — LIDOCAINE-EPINEPHRINE (PF) 1 %-1:200000 IJ SOLN
INTRAMUSCULAR | Status: AC
  Filled 2015-08-28: qty 30

## 2015-08-28 MED ORDER — ONDANSETRON HCL 4 MG/2ML IJ SOLN: 4.0000 mg | Freq: Once | INTRAMUSCULAR | Status: DC | PRN

## 2015-08-28 MED ORDER — HEPARIN SODIUM (PORCINE) 5000 UNIT/ML IJ SOLN
INTRAMUSCULAR | Status: DC | PRN
  Administered 2015-08-28: 10:00:00

## 2015-08-28 MED ORDER — PROPOFOL 500 MG/50ML IV EMUL
INTRAVENOUS | Status: DC | PRN
  Administered 2015-08-28: 75 ug/kg/min via INTRAVENOUS

## 2015-08-28 MED ORDER — FENTANYL CITRATE (PF) 100 MCG/2ML IJ SOLN: 25.0000 ug | INTRAMUSCULAR | Status: DC | PRN

## 2015-08-28 MED ORDER — LIDOCAINE HCL (CARDIAC) 20 MG/ML IV SOLN
INTRAVENOUS | Status: DC | PRN
  Administered 2015-08-28: 40 mg via INTRATRACHEAL

## 2015-08-28 MED ORDER — SODIUM CHLORIDE 0.9 % IV SOLN
INTRAVENOUS | Status: DC
  Administered 2015-08-28 (×2): via INTRAVENOUS

## 2015-08-28 SURGICAL SUPPLY — 23 items
CANISTER SUCTION 2500CC (MISCELLANEOUS) ×3 IMPLANT
CLIP TI MEDIUM 6 (CLIP) ×3 IMPLANT
CLIP TI WIDE RED SMALL 6 (CLIP) ×3 IMPLANT
COVER PROBE W GEL 5X96 (DRAPES) IMPLANT
ELECT REM PT RETURN 9FT ADLT (ELECTROSURGICAL) ×3
ELECTRODE REM PT RTRN 9FT ADLT (ELECTROSURGICAL) ×1 IMPLANT
GAUZE SPONGE 4X4 12PLY STRL (GAUZE/BANDAGES/DRESSINGS) IMPLANT
GEL ULTRASOUND 20GR AQUASONIC (MISCELLANEOUS) IMPLANT
GLOVE SS BIOGEL STRL SZ 7 (GLOVE) ×1 IMPLANT
GLOVE SUPERSENSE BIOGEL SZ 7 (GLOVE) ×2
GOWN STRL REUS W/ TWL LRG LVL3 (GOWN DISPOSABLE) ×3 IMPLANT
GOWN STRL REUS W/TWL LRG LVL3 (GOWN DISPOSABLE) ×6
KIT BASIN OR (CUSTOM PROCEDURE TRAY) ×3 IMPLANT
KIT ROOM TURNOVER OR (KITS) ×3 IMPLANT
LIQUID BAND (GAUZE/BANDAGES/DRESSINGS) ×3 IMPLANT
NS IRRIG 1000ML POUR BTL (IV SOLUTION) ×3 IMPLANT
PACK CV ACCESS (CUSTOM PROCEDURE TRAY) ×3 IMPLANT
PAD ARMBOARD 7.5X6 YLW CONV (MISCELLANEOUS) IMPLANT
SUT PROLENE 6 0 BV (SUTURE) ×3 IMPLANT
SUT VIC AB 3-0 SH 27 (SUTURE) ×2
SUT VIC AB 3-0 SH 27X BRD (SUTURE) ×1 IMPLANT
UNDERPAD 30X30 INCONTINENT (UNDERPADS AND DIAPERS) ×3 IMPLANT
WATER STERILE IRR 1000ML POUR (IV SOLUTION) ×3 IMPLANT

## 2015-08-28 NOTE — Anesthesia Preprocedure Evaluation (Addendum)
Anesthesia Evaluation  Patient identified by MRN, date of birth, ID band Patient awake    Reviewed: Allergy & Precautions, NPO status , Patient's Chart, lab work & pertinent test results  History of Anesthesia Complications Negative for: history of anesthetic complications  Airway Mallampati: II  TM Distance: >3 FB Neck ROM: Full    Dental  (+) Dental Advisory Given, Edentulous Upper, Edentulous Lower   Pulmonary asthma ,    Pulmonary exam normal breath sounds clear to auscultation       Cardiovascular (-) angina+ DVT  (-) CAD and (-) Past MI Normal cardiovascular exam Rhythm:Regular Rate:Normal     Neuro/Psych PSYCHIATRIC DISORDERS Anxiety Dementia  negative neurological ROS     GI/Hepatic Neg liver ROS, GERD  ,  Endo/Other  negative endocrine ROS  Renal/GU ESRF and DialysisRenal disease (Tuesday/Thursday/Saturday)K+ 3.8 today     Musculoskeletal  (+) Arthritis , Osteoarthritis,    Abdominal   Peds  Hematology  (+) Blood dyscrasia, anemia ,   Anesthesia Other Findings Day of surgery medications reviewed with the patient.  Reproductive/Obstetrics                           Anesthesia Physical Anesthesia Plan  ASA: IV  Anesthesia Plan: MAC   Post-op Pain Management:    Induction: Intravenous  Airway Management Planned: Nasal Cannula  Additional Equipment:   Intra-op Plan:   Post-operative Plan:   Informed Consent: I have reviewed the patients History and Physical, chart, labs and discussed the procedure including the risks, benefits and alternatives for the proposed anesthesia with the patient or authorized representative who has indicated his/her understanding and acceptance.   Dental advisory given  Plan Discussed with: CRNA and Anesthesiologist  Anesthesia Plan Comments: (Discussed risks/benefits/alternatives to MAC sedation including need for ventilatory support,  hypotension, need for conversion to general anesthesia.  All patient questions answered.  Patient wished to proceed.  Backup GA with LMA)        Anesthesia Quick Evaluation

## 2015-08-28 NOTE — Transfer of Care (Signed)
Immediate Anesthesia Transfer of Care Note  Patient: [name]  Procedure(s) Performed: Procedure(s): REVISON OF ARTERIOVENOUS FISTULA (Left)  Patient Location: PACU  Anesthesia Type:MAC  Level of Consciousness: awake, alert , oriented and patient cooperative  Airway & Oxygen Therapy: Patient Spontanous Breathing and Patient connected to nasal cannula oxygen  Post-op Assessment: Report given to RN and Post -op Vital signs reviewed and stable  Post vital signs: Reviewed and stable  Last Vitals:  Filed Vitals:   08/28/15 0720  BP: 91/48  Pulse: 70  Temp: 36.4 C  Resp: 18    Complications: No apparent anesthesia complications

## 2015-08-28 NOTE — H&P (View-Only) (Signed)
Vascular and Vein Specialist of [facility]  Patient name: [name] MRN: [id number] DOB: 1933/04/08 Sex: female  REASON FOR VISIT: Eschar over the upper arm AV fistula  HPI: ZISSEL BIEDERMAN is a 82 y.o. female seen today sent over from the dialysis unit with concern regarding an eschar over her left upper arm AV fistula. The fistula was actually created by myself approximately 8 years ago. She went for a long period of time not being on hemodialysis. She has now been on hemodialysis for the last several years. She does report some chronic discomfort in her left arm but no steal symptoms. She does have some episodes of bleeding around this eschar. There is been no discussion of poor flow  Past Medical History  Diagnosis Date  . Gout   . CKD (chronic kidney disease) stage V requiring chronic dialysis (HCC)   . Hypertension   . Hyperlipemia   . Dementia     Family History  Problem Relation Age of Onset  . Colon cancer Neg Hx   . Diabetes Mellitus II Neg Hx     SOCIAL HISTORY: Social History  Substance Use Topics  . Smoking status: Never Smoker   . Smokeless tobacco: Never Used  . Alcohol Use: No    Allergies  Allergen Reactions  . Nsaids Other (See Comments)    REACTION: Avoid NSAIDS(renal failure)    Current Outpatient Prescriptions  Medication Sig Dispense Refill  . albuterol (PROAIR HFA) 108 (90 BASE) MCG/ACT inhaler Inhale 2 puffs into the lungs every 6 (six) hours as needed for wheezing or shortness of breath.     Marland Kitchen albuterol (PROVENTIL) (2.5 MG/3ML) 0.083% nebulizer solution Take 3 mLs by nebulization daily as needed.  11  . cinacalcet (SENSIPAR) 30 MG tablet Take 1 tablet (30 mg total) by mouth daily with breakfast. 30 tablet 3  . diazepam (VALIUM) 5 MG tablet Take 0.5-1 tablets every 8 hours as needed for muscle spasm 10 tablet 0  . feeding supplement, RESOURCE BREEZE, (RESOURCE BREEZE) LIQD Take 1 Container by mouth 2 (two) times daily between meals. 30  Container 0  . FLOVENT DISKUS 100 MCG/BLIST AEPB Inhale 2 puffs into the lungs daily as needed.  3  . HYDROcodone-acetaminophen (NORCO/VICODIN) 5-325 MG per tablet Take 1-2 tablets by mouth every 4 (four) hours as needed for moderate pain or severe pain. (Patient taking differently: Take 1 tablet by mouth every 4 (four) hours as needed for moderate pain or severe pain. ) 30 tablet 0  . LORazepam (ATIVAN) 0.5 MG tablet Take 1 tablet (0.5 mg total) by mouth every 12 (twelve) hours as needed for anxiety. Also-may give 1 hour prior to dialysis 30 tablet 0  . midodrine (PROAMATINE) 10 MG tablet Take 1 tablet (10 mg total) by mouth 3 (three) times daily with meals. 90 tablet 3  . multivitamin (RENA-VIT) TABS tablet Take 1 tablet by mouth at bedtime.     . nitroGLYCERIN (NITROSTAT) 0.4 MG SL tablet Place 0.4 mg under the tongue every 5 (five) minutes as needed for chest pain.    . Nutritional Supplements (FEEDING SUPPLEMENT, NEPRO CARB STEADY,) LIQD Take 237 mLs by mouth 2 (two) times daily between meals. (Patient taking differently: Take 237 mLs by mouth daily as needed (feeding supplement). ) 60 Can 3  . guaifenesin (ROBITUSSIN) 100 MG/5ML syrup Take 10 mLs (200 mg total) by mouth every 4 (four) hours while awake. (Patient not taking: Reported on 08/10/2014) 120 mL 0  . traMADol (  ULTRAM) 50 MG tablet Take 1 tablet (50 mg total) by mouth every 6 (six) hours as needed for moderate pain. (Patient not taking: Reported on 08/10/2014) 30 tablet 0  . warfarin (COUMADIN) 4 MG tablet Take 1 tablet (4 mg total) by mouth every evening. (Patient not taking: Reported on 08/25/2015) 30 tablet 0   No current facility-administered medications for this visit.    REVIEW OF SYSTEMS:   denotes positive finding,  denotes negative finding Cardiac  Comments:  Chest pain or chest pressure:    Shortness of breath upon exertion:    Short of breath when lying flat:    Irregular heart rhythm:        Vascular    Pain in  calf, thigh, or hip brought on by ambulation:    Pain in feet at night that wakes you up from your sleep:     Blood clot in your veins:    Leg swelling:         Pulmonary    Oxygen at home:    Productive cough:     Wheezing:         Neurologic    Sudden weakness in arms or legs:     Sudden numbness in arms or legs:     Sudden onset of difficulty speaking or slurred speech:    Temporary loss of vision in one eye:     Problems with dizziness:         Gastrointestinal    Blood in stool:     Vomited blood:         Genitourinary    Burning when urinating:     Blood in urine:        Psychiatric    Major depression:         Hematologic    Bleeding problems:    Problems with blood clotting too easily:        Skin    Rashes or ulcers:        Constitutional    Fever or chills:      PHYSICAL EXAM: Filed Vitals:   08/25/15 1201  BP: 88/59  Pulse: 83  Temp: 97 F (36.1 C)  TempSrc: Oral  Resp: 18  Height:  (1.575 m)  Weight: 159 lb 4.8 oz (72.258 kg)  SpO2: 100%    GENERAL: The patient is a well-nourished female, in no acute distress. The vital signs are documented above. VASCULAR: Absent radial pulses bilaterally PULMONARY: There is good air exchange   MUSCULOSKELETAL: There are no major deformities or cyanosis. NEUROLOGIC: No focal weakness or paresthesias are detected. SKIN: There are no ulcers or rashes noted. PSYCHIATRIC: The patient has a normal affect. Her access is noted for a very large vein size with some tortuosity. She has an excellent thrill. It looks as though she is mainly had the access at 2 sites on the fistula. The area closest to the antecubital space has a 1 cm eschar that is partially separated. There is no active bleeding. For hemodialysis and has a bleeding from one of the puncture site which is easily controlled with the slight pressure and a 2 x 2.   MEDICAL ISSUES: Discussed the issue of the separating eschar with the patient and her  family present. Explained this is a high risk for serious bleeding. Have recommended revision of this. I offered tomorrow. Her family members unable to get off work tomorrow but states that her next nondialysis day would be Friday  and this would be satisfactory. I feel that this is safe to plan this. Also explained what to do if she should have any bleeding from this. We will plan on excision of the eschar and possible control and repair of the vein beneath this. Will be done as an outpatient on 08/28/2015 No Follow-up on file.   Gretta Began [facility]: [phone]

## 2015-08-28 NOTE — Anesthesia Postprocedure Evaluation (Signed)
Anesthesia Post Note  Patient: [name]  Procedure(s) Performed: Procedure(s) (LRB): REVISON OF ARTERIOVENOUS FISTULA (Left)  Patient location during evaluation: PACU Anesthesia Type: MAC Level of consciousness: awake and alert Pain management: pain level controlled Vital Signs Assessment: post-procedure vital signs reviewed and stable Respiratory status: spontaneous breathing, nonlabored ventilation, respiratory function stable and patient connected to nasal cannula oxygen Cardiovascular status: stable and blood pressure returned to baseline Anesthetic complications: no    Last Vitals:  Filed Vitals:   08/28/15 1055 08/28/15 1111  BP: 100/52 127/66  Pulse: 72   Temp:    Resp: 18     Last Pain:  Filed Vitals:   08/28/15 1115  PainSc: 0-No pain                 Cecile Hearing

## 2015-08-28 NOTE — Op Note (Signed)
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 After infiltration of 1% Xylocaine with epinephrine the dilated segment of the brachiocephalic fistula which extended from near the arterial anastomosis about 7 or 8 cm proximally was marked to excise an elliptical area of skin overlying this aneurysm. There was an eschar measuring about a centimeter and a half in diameter in the center of the eroded skin. The Xylocaine was used to elevate the skin off of the aneurysm. Elliptical incision was made over the skin and skin was completely excised. Proximal distal control was obtained in the fistula was then occluded faster clamps proximally and distally. About 50-60% of the overall circumference of the fistula was excised in elliptical fashion anteriorly to decrease the size and removed portion of the aneurysm. This left a very sturdy wall and it was reapproximated with continuous 5-0 Prolene. Clamps were then released after appropriate flushing was excellent pulse and palpable thrill. Following adequate hemostasis the skin was easily reapproximated over the fistula in 2 layers with 3-0 Vicryl in subcuticular fashion with Dermabond. Patient was taken to recovery in satisfactory condition   Josephina Gip, MD 08/28/2015 10:30 AM

## 2015-08-28 NOTE — Interval H&P Note (Signed)
History and Physical Interval Note:  08/28/2015 8:54 AM  [name]  has presented today for surgery, with the diagnosis of End Stage Renal Disease N18.6  The various methods of treatment have been discussed with the patient and family. After consideration of risks, benefits and other options for treatment, the patient has consented to  Procedure(s): REVISON OF ARTERIOVENOUS FISTULA (Left) as a surgical intervention .  The patient's history has been reviewed, patient examined, no change in status, stable for surgery.  I have reviewed the patient's chart and labs.  Questions were answered to the patient's satisfaction.     Josephina Gip

## 2015-08-29 DIAGNOSIS — N186 End stage renal disease: Secondary | ICD-10-CM | POA: Diagnosis not present

## 2015-08-29 DIAGNOSIS — I120 Hypertensive chronic kidney disease with stage 5 chronic kidney disease or end stage renal disease: Secondary | ICD-10-CM | POA: Diagnosis not present

## 2015-08-29 DIAGNOSIS — Z992 Dependence on renal dialysis: Secondary | ICD-10-CM | POA: Insufficient documentation

## 2015-08-29 DIAGNOSIS — Z79899 Other long term (current) drug therapy: Secondary | ICD-10-CM | POA: Diagnosis not present

## 2015-08-29 DIAGNOSIS — M1990 Unspecified osteoarthritis, unspecified site: Secondary | ICD-10-CM | POA: Insufficient documentation

## 2015-08-29 DIAGNOSIS — Z86718 Personal history of other venous thrombosis and embolism: Secondary | ICD-10-CM | POA: Diagnosis not present

## 2015-08-29 DIAGNOSIS — Y832 Surgical operation with anastomosis, bypass or graft as the cause of abnormal reaction of the patient, or of later complication, without mention of misadventure at the time of the procedure: Secondary | ICD-10-CM | POA: Insufficient documentation

## 2015-08-29 DIAGNOSIS — F419 Anxiety disorder, unspecified: Secondary | ICD-10-CM | POA: Diagnosis not present

## 2015-08-29 DIAGNOSIS — Z7982 Long term (current) use of aspirin: Secondary | ICD-10-CM | POA: Diagnosis not present

## 2015-08-29 DIAGNOSIS — J45909 Unspecified asthma, uncomplicated: Secondary | ICD-10-CM | POA: Diagnosis not present

## 2015-08-29 DIAGNOSIS — T82838A Hemorrhage of vascular prosthetic devices, implants and grafts, initial encounter: Secondary | ICD-10-CM | POA: Insufficient documentation

## 2015-08-29 DIAGNOSIS — Z7901 Long term (current) use of anticoagulants: Secondary | ICD-10-CM | POA: Insufficient documentation

## 2015-08-29 DIAGNOSIS — F0390 Unspecified dementia without behavioral disturbance: Secondary | ICD-10-CM | POA: Diagnosis not present

## 2015-08-29 DIAGNOSIS — E785 Hyperlipidemia, unspecified: Secondary | ICD-10-CM | POA: Diagnosis not present

## 2015-08-29 LAB — CBC WITH DIFFERENTIAL/PLATELET
BASOS ABS: 0.1 10*3/uL (ref 0.0–0.1)
Basophils Relative: 1 %
EOS PCT: 2 %
Eosinophils Absolute: 0.1 10*3/uL (ref 0.0–0.7)
HEMATOCRIT: 37.5 % (ref 36.0–46.0)
HEMOGLOBIN: 12.2 g/dL (ref 12.0–15.0)
LYMPHS ABS: 2.3 10*3/uL (ref 0.7–4.0)
LYMPHS PCT: 40 %
MCH: 31.9 pg (ref 26.0–34.0)
MCHC: 32.5 g/dL (ref 30.0–36.0)
MCV: 98.2 fL (ref 78.0–100.0)
MONOS PCT: 7 %
Monocytes Absolute: 0.4 10*3/uL (ref 0.1–1.0)
Neutro Abs: 2.8 10*3/uL (ref 1.7–7.7)
Neutrophils Relative %: 50 %
Platelets: 173 10*3/uL (ref 150–400)
RBC: 3.82 MIL/uL — AB (ref 3.87–5.11)
RDW: 15.6 % — AB (ref 11.5–15.5)
WBC: 5.7 10*3/uL (ref 4.0–10.5)

## 2015-08-29 LAB — COMPREHENSIVE METABOLIC PANEL
ALBUMIN: 3.3 g/dL — AB (ref 3.5–5.0)
ALK PHOS: 165 U/L — AB (ref 38–126)
ALT: 11 U/L — AB (ref 14–54)
AST: 15 U/L (ref 15–41)
Anion gap: 18 — ABNORMAL HIGH (ref 5–15)
BILIRUBIN TOTAL: 0.7 mg/dL (ref 0.3–1.2)
BUN: 52 mg/dL — AB (ref 6–20)
CO2: 26 mmol/L (ref 22–32)
CREATININE: 9.37 mg/dL — AB (ref 0.44–1.00)
Calcium: 8.3 mg/dL — ABNORMAL LOW (ref 8.9–10.3)
Chloride: 98 mmol/L — ABNORMAL LOW (ref 101–111)
GFR calc Af Amer: 4 mL/min — ABNORMAL LOW (ref >60)
GFR, EST NON AFRICAN AMERICAN: 3 mL/min — AB (ref >60)
GLUCOSE: 82 mg/dL (ref 65–99)
POTASSIUM: 4 mmol/L (ref 3.5–5.1)
Sodium: 142 mmol/L (ref 135–145)
TOTAL PROTEIN: 6.5 g/dL (ref 6.5–8.1)

## 2015-08-29 NOTE — ED Notes (Signed)
Pt had surgery on vascular graft on Friday and now has bleeding, graft is pulsating but no vibration noted and bruit fait, pt reports pain in same swelling noted, pms intact below surgery

## 2015-08-30 ENCOUNTER — Emergency Department (HOSPITAL_COMMUNITY): Payer: Medicare Other | Admitting: Anesthesiology

## 2015-08-30 ENCOUNTER — Emergency Department (HOSPITAL_COMMUNITY): Payer: Medicare Other

## 2015-08-30 ENCOUNTER — Encounter (HOSPITAL_COMMUNITY)
Admission: EM | Disposition: A | Discharge status: Home / Self Care | Payer: Self-pay | Source: Home / Self Care | Attending: Emergency Medicine

## 2015-08-30 ENCOUNTER — Ambulatory Visit (HOSPITAL_COMMUNITY)
Admission: EM | Admit: 2015-08-30 | Discharge: 2015-08-30 | Disposition: A | Discharge status: Home / Self Care | Payer: Medicare Other | Attending: Emergency Medicine | Admitting: Emergency Medicine

## 2015-08-30 DIAGNOSIS — Z419 Encounter for procedure for purposes other than remedying health state, unspecified: Secondary | ICD-10-CM

## 2015-08-30 DIAGNOSIS — T82898S Other specified complication of vascular prosthetic devices, implants and grafts, sequela: Secondary | ICD-10-CM | POA: Diagnosis not present

## 2015-08-30 DIAGNOSIS — T82838A Hemorrhage of vascular prosthetic devices, implants and grafts, initial encounter: Secondary | ICD-10-CM | POA: Diagnosis not present

## 2015-08-30 DIAGNOSIS — T82590A Other mechanical complication of surgically created arteriovenous fistula, initial encounter: Secondary | ICD-10-CM

## 2015-08-30 DIAGNOSIS — Z992 Dependence on renal dialysis: Secondary | ICD-10-CM

## 2015-08-30 DIAGNOSIS — N186 End stage renal disease: Secondary | ICD-10-CM | POA: Diagnosis not present

## 2015-08-30 DIAGNOSIS — Z95828 Presence of other vascular implants and grafts: Secondary | ICD-10-CM

## 2015-08-30 DIAGNOSIS — I120 Hypertensive chronic kidney disease with stage 5 chronic kidney disease or end stage renal disease: Secondary | ICD-10-CM | POA: Diagnosis not present

## 2015-08-30 HISTORY — PX: LIGATION OF ARTERIOVENOUS  FISTULA: SHX5948

## 2015-08-30 HISTORY — PX: INSERTION OF DIALYSIS CATHETER: SHX1324

## 2015-08-30 SURGERY — INSERTION OF DIALYSIS CATHETER
Anesthesia: General | Laterality: Right

## 2015-08-30 MED ORDER — ONDANSETRON HCL 4 MG/2ML IJ SOLN
INTRAMUSCULAR | Status: DC | PRN
  Administered 2015-08-30: 4 mg via INTRAVENOUS

## 2015-08-30 MED ORDER — LIDOCAINE HCL (CARDIAC) 20 MG/ML IV SOLN
INTRAVENOUS | Status: DC | PRN
  Administered 2015-08-30: 60 mg via INTRATRACHEAL

## 2015-08-30 MED ORDER — SODIUM CHLORIDE 0.9 % IV SOLN
INTRAVENOUS | Status: DC
Start: 2015-08-30 — End: 2015-08-30

## 2015-08-30 MED ORDER — PHENYLEPHRINE HCL 10 MG/ML IJ SOLN
10.0000 mg | INTRAVENOUS | Status: DC | PRN
  Administered 2015-08-30: 60 ug/min via INTRAVENOUS

## 2015-08-30 MED ORDER — ROCURONIUM BROMIDE 100 MG/10ML IV SOLN
INTRAVENOUS | Status: DC | PRN
  Administered 2015-08-30: 30 mg via INTRAVENOUS

## 2015-08-30 MED ORDER — FENTANYL CITRATE (PF) 250 MCG/5ML IJ SOLN
INTRAMUSCULAR | Status: AC
  Filled 2015-08-30: qty 5

## 2015-08-30 MED ORDER — HYDROCODONE-ACETAMINOPHEN 5-325 MG PO TABS: 1.0000 | ORAL_TABLET | Freq: Four times a day (QID) | ORAL | Status: DC | PRN

## 2015-08-30 MED ORDER — NEOSTIGMINE METHYLSULFATE 10 MG/10ML IV SOLN
INTRAVENOUS | Status: AC
  Filled 2015-08-30: qty 1

## 2015-08-30 MED ORDER — EPHEDRINE SULFATE 50 MG/ML IJ SOLN
INTRAMUSCULAR | Status: DC | PRN
  Administered 2015-08-30: 10 mg via INTRAVENOUS
  Administered 2015-08-30 (×2): 15 mg via INTRAVENOUS

## 2015-08-30 MED ORDER — CEFAZOLIN SODIUM-DEXTROSE 2-3 GM-% IV SOLR
INTRAVENOUS | Status: AC
  Filled 2015-08-30: qty 50

## 2015-08-30 MED ORDER — 0.9 % SODIUM CHLORIDE (POUR BTL) OPTIME
TOPICAL | Status: DC | PRN
  Administered 2015-08-30: 1000 mL

## 2015-08-30 MED ORDER — ONDANSETRON HCL 4 MG/2ML IJ SOLN
INTRAMUSCULAR | Status: AC
  Filled 2015-08-30: qty 2

## 2015-08-30 MED ORDER — HYDROMORPHONE HCL 1 MG/ML IJ SOLN: 0.2500 mg | INTRAMUSCULAR | Status: DC | PRN

## 2015-08-30 MED ORDER — CEFAZOLIN SODIUM-DEXTROSE 2-3 GM-% IV SOLR
2.0000 g | Freq: Once | INTRAVENOUS | Status: AC
  Administered 2015-08-30: 2 g via INTRAVENOUS

## 2015-08-30 MED ORDER — PROMETHAZINE HCL 25 MG/ML IJ SOLN: 6.2500 mg | INTRAMUSCULAR | Status: DC | PRN

## 2015-08-30 MED ORDER — HEPARIN SODIUM (PORCINE) 1000 UNIT/ML IJ SOLN
INTRAMUSCULAR | Status: AC
  Filled 2015-08-30: qty 1

## 2015-08-30 MED ORDER — ALBUMIN HUMAN 5 % IV SOLN
INTRAVENOUS | Status: AC
  Filled 2015-08-30: qty 250

## 2015-08-30 MED ORDER — THROMBIN 20000 UNITS EX SOLR
CUTANEOUS | Status: AC
  Filled 2015-08-30: qty 20000

## 2015-08-30 MED ORDER — SODIUM CHLORIDE 0.9 % IV SOLN
INTRAVENOUS | Status: DC | PRN
  Administered 2015-08-30: 10:00:00 via INTRAVENOUS

## 2015-08-30 MED ORDER — PROPOFOL 10 MG/ML IV BOLUS
INTRAVENOUS | Status: AC
  Filled 2015-08-30: qty 20

## 2015-08-30 MED ORDER — GLYCOPYRROLATE 0.2 MG/ML IJ SOLN
INTRAMUSCULAR | Status: AC
  Filled 2015-08-30: qty 2

## 2015-08-30 MED ORDER — SODIUM CHLORIDE 0.9 % IV SOLN
INTRAVENOUS | Status: DC | PRN
  Administered 2015-08-30: 500 mL

## 2015-08-30 MED ORDER — ROCURONIUM BROMIDE 50 MG/5ML IV SOLN
INTRAVENOUS | Status: AC
  Filled 2015-08-30: qty 1

## 2015-08-30 MED ORDER — NEOSTIGMINE METHYLSULFATE 10 MG/10ML IV SOLN
INTRAVENOUS | Status: DC | PRN
  Administered 2015-08-30: 2 mg via INTRAVENOUS

## 2015-08-30 MED ORDER — GLYCOPYRROLATE 0.2 MG/ML IJ SOLN
INTRAMUSCULAR | Status: DC | PRN
  Administered 2015-08-30: 0.4 mg via INTRAVENOUS

## 2015-08-30 MED ORDER — HEMOSTATIC AGENTS (NO CHARGE) OPTIME
TOPICAL | Status: DC | PRN
  Administered 2015-08-30 (×2): 1 via TOPICAL

## 2015-08-30 MED ORDER — LIDOCAINE HCL (PF) 1 % IJ SOLN
INTRAMUSCULAR | Status: AC
  Filled 2015-08-30: qty 30

## 2015-08-30 MED ORDER — PROPOFOL 10 MG/ML IV BOLUS
INTRAVENOUS | Status: DC | PRN
  Administered 2015-08-30: 100 mg via INTRAVENOUS
  Administered 2015-08-30: 10 mg via INTRAVENOUS
  Administered 2015-08-30: 20 mg via INTRAVENOUS

## 2015-08-30 MED ORDER — LIDOCAINE HCL (CARDIAC) 20 MG/ML IV SOLN
INTRAVENOUS | Status: AC
  Filled 2015-08-30: qty 5

## 2015-08-30 MED ORDER — LIDOCAINE-EPINEPHRINE (PF) 1 %-1:200000 IJ SOLN
INTRAMUSCULAR | Status: AC
  Filled 2015-08-30: qty 30

## 2015-08-30 MED ORDER — HEPARIN SODIUM (PORCINE) 1000 UNIT/ML IJ SOLN
INTRAMUSCULAR | Status: DC | PRN
  Administered 2015-08-30: 4.6 mL

## 2015-08-30 SURGICAL SUPPLY — 49 items
BANDAGE ACE 4X5 VEL STRL LF (GAUZE/BANDAGES/DRESSINGS) ×4 IMPLANT
BNDG GAUZE ELAST 4 BULKY (GAUZE/BANDAGES/DRESSINGS) ×4 IMPLANT
CANISTER SUCTION 2500CC (MISCELLANEOUS) ×4 IMPLANT
CATH PALINDROME RT-P 15FX23CM (CATHETERS) ×4 IMPLANT
CLIP TI MEDIUM 6 (CLIP) ×4 IMPLANT
CLIP TI WIDE RED SMALL 6 (CLIP) ×4 IMPLANT
COVER PROBE W GEL 5X96 (DRAPES) ×4 IMPLANT
DRAPE CHEST BREAST 15X10 FENES (DRAPES) ×4 IMPLANT
ELECT REM PT RETURN 9FT ADLT (ELECTROSURGICAL) ×4
ELECTRODE REM PT RTRN 9FT ADLT (ELECTROSURGICAL) ×2 IMPLANT
GAUZE SPONGE 2X2 8PLY STRL LF (GAUZE/BANDAGES/DRESSINGS) ×2 IMPLANT
GAUZE SPONGE 4X4 12PLY STRL (GAUZE/BANDAGES/DRESSINGS) ×4 IMPLANT
GAUZE SPONGE 4X4 16PLY XRAY LF (GAUZE/BANDAGES/DRESSINGS) ×4 IMPLANT
GEL ULTRASOUND 20GR AQUASONIC (MISCELLANEOUS) ×4 IMPLANT
GLOVE BIO SURGEON STRL SZ 6.5 (GLOVE) ×6 IMPLANT
GLOVE BIO SURGEON STRL SZ7 (GLOVE) ×8 IMPLANT
GLOVE BIO SURGEONS STRL SZ 6.5 (GLOVE) ×2
GLOVE BIOGEL PI IND STRL 6.5 (GLOVE) ×4 IMPLANT
GLOVE BIOGEL PI IND STRL 7.5 (GLOVE) ×4 IMPLANT
GLOVE BIOGEL PI INDICATOR 6.5 (GLOVE) ×4
GLOVE BIOGEL PI INDICATOR 7.5 (GLOVE) ×4
GOWN STRL REUS W/ TWL LRG LVL3 (GOWN DISPOSABLE) ×8 IMPLANT
GOWN STRL REUS W/TWL LRG LVL3 (GOWN DISPOSABLE) ×8
HEMOSTAT SPONGE AVITENE ULTRA (HEMOSTASIS) ×8 IMPLANT
KIT BASIN OR (CUSTOM PROCEDURE TRAY) ×4 IMPLANT
KIT ROOM TURNOVER OR (KITS) ×4 IMPLANT
LIQUID BAND (GAUZE/BANDAGES/DRESSINGS) IMPLANT
NEEDLE 18GX1X1/2 (RX/OR ONLY) (NEEDLE) ×4 IMPLANT
NEEDLE 22X1 1/2 (OR ONLY) (NEEDLE) ×4 IMPLANT
NS IRRIG 1000ML POUR BTL (IV SOLUTION) ×4 IMPLANT
PACK CV ACCESS (CUSTOM PROCEDURE TRAY) ×4 IMPLANT
PAD ARMBOARD 7.5X6 YLW CONV (MISCELLANEOUS) ×8 IMPLANT
SPONGE GAUZE 2X2 STER 10/PKG (GAUZE/BANDAGES/DRESSINGS) ×2
SPONGE GAUZE 4X4 12PLY STER LF (GAUZE/BANDAGES/DRESSINGS) ×4 IMPLANT
SPONGE SURGIFOAM ABS GEL 100 (HEMOSTASIS) IMPLANT
SUT ETHILON 3 0 PS 1 (SUTURE) ×8 IMPLANT
SUT MNCRL AB 4-0 PS2 18 (SUTURE) ×8 IMPLANT
SUT PROLENE 6 0 BV (SUTURE) ×4 IMPLANT
SUT SILK 0 TIES 10X30 (SUTURE) ×8 IMPLANT
SUT VIC AB 2-0 CT1 36 (SUTURE) ×4 IMPLANT
SUT VIC AB 3-0 SH 27 (SUTURE) ×2
SUT VIC AB 3-0 SH 27X BRD (SUTURE) ×2 IMPLANT
SWAB COLLECTION DEVICE MRSA (MISCELLANEOUS) IMPLANT
SYR 3ML LL SCALE MARK (SYRINGE) ×4 IMPLANT
SYRINGE 10CC LL (SYRINGE) ×4 IMPLANT
TAPE CLOTH SURG 4X10 WHT LF (GAUZE/BANDAGES/DRESSINGS) ×4 IMPLANT
TUBE ANAEROBIC SPECIMEN COL (MISCELLANEOUS) IMPLANT
UNDERPAD 30X30 INCONTINENT (UNDERPADS AND DIAPERS) ×4 IMPLANT
WATER STERILE IRR 1000ML POUR (IV SOLUTION) ×4 IMPLANT

## 2015-08-30 NOTE — Anesthesia Postprocedure Evaluation (Signed)
Anesthesia Post Note  Patient: [name]  Procedure(s) Performed: Procedure(s) (LRB): INSERTION OF DIALYSIS CATHETER RIGHT INTERNAL JUGULAR  (Right) LIGATION OF ARTERIOVENOUS  FISTULA  LEFT ARM (Left)  Patient location during evaluation: PACU Anesthesia Type: General Level of consciousness: sedated Pain management: pain level controlled Vital Signs Assessment: post-procedure vital signs reviewed and stable Respiratory status: spontaneous breathing and respiratory function stable Cardiovascular status: stable Anesthetic complications: no    Last Vitals:  Filed Vitals:   08/30/15 1215 08/30/15 1220  BP: 86/52 92/52  Pulse: 82 78  Temp:    Resp: 13 17    Last Pain:  Filed Vitals:   08/30/15 1225  PainSc: 0-No pain                 [name] DANIEL

## 2015-08-30 NOTE — Transfer of Care (Signed)
Immediate Anesthesia Transfer of Care Note  Patient: [name]  Procedure(s) Performed: Procedure(s): INSERTION OF DIALYSIS CATHETER RIGHT INTERNAL JUGULAR  (Right) LIGATION OF ARTERIOVENOUS  FISTULA  LEFT ARM (Left)  Patient Location: PACU  Anesthesia Type:General  Level of Consciousness: awake  Airway & Oxygen Therapy: Patient Spontanous Breathing and Patient connected to nasal cannula oxygen  Post-op Assessment: Report given to RN and Post -op Vital signs reviewed and stable  Post vital signs: Reviewed and stable  Last Vitals:  Filed Vitals:   08/30/15 1300 08/30/15 1330  BP: 87/57 93/53  Pulse:  86  Temp:    Resp:  16    Complications: No apparent anesthesia complications

## 2015-08-30 NOTE — ED Notes (Signed)
Vascular surgery at bedside. Pt to do ligation of AV fistula today per surgeon.

## 2015-08-30 NOTE — H&P (View-Only) (Signed)
Vascular and Vein Specialist of [facility]  Patient name: [name] MRN: [id number] DOB: 1933/04/08 Sex: female  REASON FOR VISIT: Eschar over the upper arm AV fistula  HPI: [name] is a 82 y.o. female seen today sent over from the dialysis unit with concern regarding an eschar over her left upper arm AV fistula. The fistula was actually created by myself approximately 8 years ago. She went for a long period of time not being on hemodialysis. She has now been on hemodialysis for the last several years. She does report some chronic discomfort in her left arm but no steal symptoms. She does have some episodes of bleeding around this eschar. There is been no discussion of poor flow  Past Medical History  Diagnosis Date  . Gout   . CKD (chronic kidney disease) stage V requiring chronic dialysis (HCC)   . Hypertension   . Hyperlipemia   . Dementia     Family History  Problem Relation Age of Onset  . Colon cancer Neg Hx   . Diabetes Mellitus II Neg Hx     SOCIAL HISTORY: Social History  Substance Use Topics  . Smoking status: Never Smoker   . Smokeless tobacco: Never Used  . Alcohol Use: No    Allergies  Allergen Reactions  . Nsaids Other (See Comments)    REACTION: Avoid NSAIDS(renal failure)    Current Outpatient Prescriptions  Medication Sig Dispense Refill  . albuterol (PROAIR HFA) 108 (90 BASE) MCG/ACT inhaler Inhale 2 puffs into the lungs every 6 (six) hours as needed for wheezing or shortness of breath.     Marland Kitchen albuterol (PROVENTIL) (2.5 MG/3ML) 0.083% nebulizer solution Take 3 mLs by nebulization daily as needed.  11  . cinacalcet (SENSIPAR) 30 MG tablet Take 1 tablet (30 mg total) by mouth daily with breakfast. 30 tablet 3  . diazepam (VALIUM) 5 MG tablet Take 0.5-1 tablets every 8 hours as needed for muscle spasm 10 tablet 0  . feeding supplement, RESOURCE BREEZE, (RESOURCE BREEZE) LIQD Take 1 Container by mouth 2 (two) times daily between meals. 30  Container 0  . FLOVENT DISKUS 100 MCG/BLIST AEPB Inhale 2 puffs into the lungs daily as needed.  3  . HYDROcodone-acetaminophen (NORCO/VICODIN) 5-325 MG per tablet Take 1-2 tablets by mouth every 4 (four) hours as needed for moderate pain or severe pain. (Patient taking differently: Take 1 tablet by mouth every 4 (four) hours as needed for moderate pain or severe pain. ) 30 tablet 0  . LORazepam (ATIVAN) 0.5 MG tablet Take 1 tablet (0.5 mg total) by mouth every 12 (twelve) hours as needed for anxiety. Also-may give 1 hour prior to dialysis 30 tablet 0  . midodrine (PROAMATINE) 10 MG tablet Take 1 tablet (10 mg total) by mouth 3 (three) times daily with meals. 90 tablet 3  . multivitamin (RENA-VIT) TABS tablet Take 1 tablet by mouth at bedtime.     . nitroGLYCERIN (NITROSTAT) 0.4 MG SL tablet Place 0.4 mg under the tongue every 5 (five) minutes as needed for chest pain.    . Nutritional Supplements (FEEDING SUPPLEMENT, NEPRO CARB STEADY,) LIQD Take 237 mLs by mouth 2 (two) times daily between meals. (Patient taking differently: Take 237 mLs by mouth daily as needed (feeding supplement). ) 60 Can 3  . guaifenesin (ROBITUSSIN) 100 MG/5ML syrup Take 10 mLs (200 mg total) by mouth every 4 (four) hours while awake. (Patient not taking: Reported on 08/10/2014) 120 mL 0  . traMADol (  ULTRAM) 50 MG tablet Take 1 tablet (50 mg total) by mouth every 6 (six) hours as needed for moderate pain. (Patient not taking: Reported on 08/10/2014) 30 tablet 0  . warfarin (COUMADIN) 4 MG tablet Take 1 tablet (4 mg total) by mouth every evening. (Patient not taking: Reported on 08/25/2015) 30 tablet 0   No current facility-administered medications for this visit.    REVIEW OF SYSTEMS:   denotes positive finding,  denotes negative finding Cardiac  Comments:  Chest pain or chest pressure:    Shortness of breath upon exertion:    Short of breath when lying flat:    Irregular heart rhythm:        Vascular    Pain in  calf, thigh, or hip brought on by ambulation:    Pain in feet at night that wakes you up from your sleep:     Blood clot in your veins:    Leg swelling:         Pulmonary    Oxygen at home:    Productive cough:     Wheezing:         Neurologic    Sudden weakness in arms or legs:     Sudden numbness in arms or legs:     Sudden onset of difficulty speaking or slurred speech:    Temporary loss of vision in one eye:     Problems with dizziness:         Gastrointestinal    Blood in stool:     Vomited blood:         Genitourinary    Burning when urinating:     Blood in urine:        Psychiatric    Major depression:         Hematologic    Bleeding problems:    Problems with blood clotting too easily:        Skin    Rashes or ulcers:        Constitutional    Fever or chills:      PHYSICAL EXAM: Filed Vitals:   08/25/15 1201  BP: 88/59  Pulse: 83  Temp: 97 F (36.1 C)  TempSrc: Oral  Resp: 18  Height:  (1.575 m)  Weight: 159 lb 4.8 oz (72.258 kg)  SpO2: 100%    GENERAL: The patient is a well-nourished female, in no acute distress. The vital signs are documented above. VASCULAR: Absent radial pulses bilaterally PULMONARY: There is good air exchange   MUSCULOSKELETAL: There are no major deformities or cyanosis. NEUROLOGIC: No focal weakness or paresthesias are detected. SKIN: There are no ulcers or rashes noted. PSYCHIATRIC: The patient has a normal affect. Her access is noted for a very large vein size with some tortuosity. She has an excellent thrill. It looks as though she is mainly had the access at 2 sites on the fistula. The area closest to the antecubital space has a 1 cm eschar that is partially separated. There is no active bleeding. For hemodialysis and has a bleeding from one of the puncture site which is easily controlled with the slight pressure and a 2 x 2.   MEDICAL ISSUES: Discussed the issue of the separating eschar with the patient and her  family present. Explained this is a high risk for serious bleeding. Have recommended revision of this. I offered tomorrow. Her family members unable to get off work tomorrow but states that her next nondialysis day would be Friday  and this would be satisfactory. I feel that this is safe to plan this. Also explained what to do if she should have any bleeding from this. We will plan on excision of the eschar and possible control and repair of the vein beneath this. Will be done as an outpatient on 08/28/2015 No Follow-up on file.   Gretta Began [facility] Vein Specialists of [place] [facility]: [phone]

## 2015-08-30 NOTE — Progress Notes (Signed)
   Daily Progress Note  Assessment/Planning: POD #2 s/p Plication of L BC AVF (with Dr. Hart Rochester) complicated with continued bleeding   Unfortunately, residual fistula wall sometimes is inadequate so there continues to be bleeding despite plication of the fistula.  Subsequently, I usually recommend: ligation of left arm fistula, placement of tunneled dialysis catheter after a failed plication.  Additionally, there is a strong pulse in proximal fistula, suggesting a proximal high grade obstruction, so I think this fistula is in the process of occluding.  The patient is aware this procedure will terminate his permanent access and a new one will need to be placed. The patient is aware the risks of tunneled dialysis catheter placement include but are not limited to: bleeding, infection, central venous injury, pneumothorax, possible venous stenosis, possible malpositioning in the venous system, and possible infections related to long-term catheter presence.  The patient was aware of these risks and agreed to proceed.  Subjective    Streaming blood from surgical incision at home  Objective Filed Vitals:   08/30/15 0500 08/30/15 0545 08/30/15 0600 08/30/15 0700  BP: 100/48 98/53  92/57  Pulse: 76 70  72  Temp:   98 F (36.7 C)   Resp:   19   Height:      Weight:      SpO2: 100% 100%  99%   No intake or output data in the 24 hours ending 08/30/15 0724  VASC  LUA: Minimal bruit and no thrill on exam, blood along incision with some new clot, strong pulse in proximal fistula  Laboratory CBC    Component Value Date/Time   WBC 5.7 08/29/2015 2235   HGB 12.2 08/29/2015 2235   HCT 37.5 08/29/2015 2235   PLT 173 08/29/2015 2235    BMET    Component Value Date/Time   NA 142 08/29/2015 2235   K 4.0 08/29/2015 2235   CL 98* 08/29/2015 2235   CO2 26 08/29/2015 2235   GLUCOSE 82 08/29/2015 2235   BUN 52* 08/29/2015 2235   CREATININE 9.37* 08/29/2015 2235   CALCIUM 8.3* 08/29/2015 2235    CALCIUM 9.6 04/10/2014 0917   GFRNONAA 3* 08/29/2015 2235   GFRAA 4* 08/29/2015 2235    Leonides Sake, MD [facility] [place] Office: [phone] Pager: [phone]  08/30/2015, 7:24 AM

## 2015-08-30 NOTE — ED Provider Notes (Signed)
CSN: [id number]     Arrival date & time 08/29/15  2206 History   By signing my name below, I, [name], attest that this documentation has been prepared under the direction and in the presence of [name], MD. Electronically Signed: Bethel Page, ED Scribe. 08/30/2015. 3:33 AM   Chief Complaint  Patient presents with  . Vascular Access Problem    The history is provided by the patient and a relative. No language interpreter was used.   [name] is a 82 y.o. female with history of CKD on T/TH/S dialysis who presents to the Emergency Department complaining of pain, bleeding (oozing), and increased swelling at the site of the vascular graft at her LUE after revision of arteriovenous fistula with aneurysmorrhaphy and excision of eroded skin 2 days ago by Dr. Hart Rochester. Relatives note that the bleeding has been intermittent but increased over the last 24 hours. She last had dialysis 3 days ago on 08/27/15. Pt did not have dialysis yesterday because of the swelling at her arm but the daughter notes that her center is open to her coming on tomorrow (08/31/15). Per daughter, the pt is no longer on Coumadin or any other blood thinner.   Past Medical History  Diagnosis Date  . Gout   . Hypertension   . Hyperlipemia   . Dementia   . Anemia   . Asthma   . CKD (chronic kidney disease) stage V requiring chronic dialysis (HCC)     T/TH/Sa  . Arthritis   . DVT (deep venous thrombosis) (HCC)     LLE DVT 06/06/14   Past Surgical History  Procedure Laterality Date  . Tubal ligation    . Av fistula placement     Family History  Problem Relation Age of Onset  . Colon cancer Neg Hx   . Diabetes Mellitus II Neg Hx    Social History  Substance Use Topics  . Smoking status: Never Smoker   . Smokeless tobacco: Former Neurosurgeon    Types: Snuff  . Alcohol Use: No   OB History    No data available     Review of Systems  10 Systems reviewed and all are negative for acute change  except as noted in the HPI.  Allergies  Nsaids  Home Medications   Prior to Admission medications   Medication Sig Start Date End Date Taking? Authorizing Provider  albuterol (PROAIR HFA) 108 (90 BASE) MCG/ACT inhaler Inhale 2 puffs into the lungs every 6 (six) hours as needed for wheezing or shortness of breath.    Yes Historical Provider, MD  albuterol (PROVENTIL) (2.5 MG/3ML) 0.083% nebulizer solution Take 3 mLs by nebulization daily as needed for wheezing or shortness of breath.  07/07/14  Yes Historical Provider, MD  ASPIRIN LOW DOSE 81 MG EC tablet Take 81 mg by mouth daily.  08/02/15  Yes Historical Provider, MD  diazepam (VALIUM) 5 MG tablet Take 0.5-1 tablets every 8 hours as needed for muscle spasm 04/28/15  Yes Renne Crigler, PA-C  FLOVENT DISKUS 100 MCG/BLIST AEPB Inhale 2 puffs into the lungs daily as needed (for wheezing or shortness of breath).  05/19/14  Yes Historical Provider, MD  HYDROcodone-acetaminophen (NORCO/VICODIN) 5-325 MG tablet Take 1 tablet by mouth every 4 (four) hours as needed for moderate pain or severe pain. 08/28/15  Yes Lars Mage, PA-C  midodrine (PROAMATINE) 10 MG tablet Take 1 tablet (10 mg total) by mouth 3 (three) times daily with meals. Patient taking differently: Take  10 mg by mouth Every Tuesday,Thursday,and Saturday with dialysis. [name], and [facility]JW 07/09/14  Yes [name], MD  multivitamin (RENA-VIT) TABS tablet Take 1 tablet by mouth at bedtime.    Yes Historical Provider, MD  nitroGLYCERIN (NITROSTAT) 0.4 MG SL tablet Place 0.4 mg under the tongue every 5 (five) minutes as needed for chest pain.   Yes Historical Provider, MD  Nutritional Supplements (FEEDING SUPPLEMENT, NEPRO CARB STEADY,) LIQD Take 237 mLs by mouth 2 (two) times daily between meals. Patient taking differently: Take 237 mLs by mouth daily as needed (feeding supplement).  06/09/14  Yes Marianne L York, PA-C  RENVELA 800 MG tablet Take 800-3,200 mg by mouth 4 (four)  times daily -  with meals and at bedtime. 4 for breakfast and dinner, 2 for lunch, 1 for snacks 06/21/15  Yes Historical Provider, MD  SENSIPAR 60 MG tablet Take 60 mg by mouth daily.  07/02/15  Yes Historical Provider, MD   BP 105/70 mmHg  Pulse 83  Temp(Src) 98.2 F (36.8 C)  Resp 18  Ht  (1.6 m)  Wt 160 lb (72.576 kg)  BMI 28.35 kg/m2  SpO2 96% Physical Exam  Constitutional: She is oriented to person, place, and time. She appears well-developed and well-nourished. No distress.  HENT:  Head: Normocephalic and atraumatic.  Eyes: EOM are normal.  Neck: Normal range of motion.  Cardiovascular: Normal rate, regular rhythm and normal heart sounds.   Pulmonary/Chest: Effort normal and breath sounds normal.  Abdominal: Soft. She exhibits no distension. There is no tenderness.  Musculoskeletal: Normal range of motion.  LUE: Pt has a 10 cm post-surgical lesion with some dried blood around it. No active bleeding at this time. Lesion lies over antecubital fossa. Both distal and proximal to lesion pt has a palpable pulse. No bruit appreciated. LUE is grossly swollen with mild erythema around the antecubital fossa. Mild TTP. No purulent discharge.   Neurological: She is alert and oriented to person, place, and time.  Skin: Skin is warm and dry.  Psychiatric: She has a normal mood and affect. Judgment normal.  Nursing note and vitals reviewed.   ED Course  Procedures (including critical care time) DIAGNOSTIC STUDIES: Oxygen Saturation is 96% on RA,  normal by my interpretation.    COORDINATION OF CARE: 3:18 AM Discussed treatment plan which includes lab work and discussion with vascular surgery in the morning with pt at bedside and pt agreed to plan.  Labs Review Labs Reviewed  CBC WITH DIFFERENTIAL/PLATELET - Abnormal; Notable for the following:    RBC 3.82 (*)    RDW 15.6 (*)    All other components within normal limits  COMPREHENSIVE METABOLIC PANEL - Abnormal; Notable for the  following:    Chloride 98 (*)    BUN 52 (*)    Creatinine, Ser 9.37 (*)    Calcium 8.3 (*)    Albumin 3.3 (*)    ALT 11 (*)    Alkaline Phosphatase 165 (*)    GFR calc non Af Amer 3 (*)    GFR calc Af Amer 4 (*)    Anion gap 18 (*)    All other components within normal limits    Imaging Review No results found. I have personally reviewed and evaluated these lab results as part of my medical decision-making.   EKG Interpretation None      MDM   Final diagnoses:  AV graft malfunction, initial encounter [facility])    I personally performed the  services described in this documentation, which was scribed in my presence. The recorded information has been reviewed and is accurate.  Pt comes in with fistula problem. She has a L sided braciho-cephalic fistula which was revision recently - had some aneurysms and eschar. She is now pod#2. She started having increased swelling and pain yday. On exam - there is increased redness of the skin and swelling - however, no fluctuance. Pt has no thrill on exam interestingly - and the areas around the fistula are hyperpulsatile and i am not sure what to make of that. Pt was c/o persistent bleeding - none present right now.  DDX; Stenosis of the AV fistula or thrombosis Infection Cellulitis of the arm  Will consult Vascular service.  Pt also missed HD on Sat. There is no need for emergent HD - they have a plan for makeup HD on Monday already.   LATE ENTRY:  Dr. Imogene Burn to see the patient.  [name], MD 08/30/15 [phone]

## 2015-08-30 NOTE — Op Note (Addendum)
OPERATIVE NOTE  PROCEDURE: 1.  Right internal jugular vein tunneled dialysis catheter placement 2.  Right internal jugular vein cannulation under ultrasound guidance 3.  Ligation and excision of distal left arm arteriovenous fistula   PRE-OPERATIVE DIAGNOSIS: end-stage renal failure  POST-OPERATIVE DIAGNOSIS: same as above  SURGEON: Leonides Sake, MD  ANESTHESIA: general  ESTIMATED BLOOD LOSS: 30 cc  FINDING(S): 1.  Tips of the catheter in the right atrium on fluoroscopy 2.  No obvious pneumothorax on fluoroscopy  SPECIMEN(S):  none  INDICATIONS:   [name] is a 82 y.o. female who presents with s/p recent plication of left upper arm arteriovenous fistula.  The patient began bleeding actively from the left arteriovenous fistula this AM.  On exam, the fistula demonstrated evidence of central venous occlusion vs stenosis, so I felt this fistula was no longer salvageable.  I recommended: ligation of the left arm arteriovenous fistula and placement of tunneled dialysis catheter.  The patient is aware the risks of tunneled dialysis catheter placement include but are not limited to: bleeding, infection, central venous injury, pneumothorax, possible venous stenosis, possible malpositioning in the venous system, and possible infections related to long-term catheter presence.  She is aware ligating the fistula will terminate the permanent access previously placed.  The patient was aware of these risks and agreed to proceed.   DESCRIPTION: After written full informed consent was obtained from the patient, the patient was taken back to the operating room.  Prior to induction, the patient was given IV antibiotics.  After obtaining adequate sedation, the patient was prepped and draped in the standard fashion for a chest or neck tunneled dialysis catheter placement.   Under ultrasound guidance, the right internal jugular vein was cannulated with the 18 gauge needle.  A J-wire was then placed  down into the right ventricle under fluoroscopic guidance.  The wire was then secured in place with a clamp to the drapes.  I then made stab incisions at the neck and exit sites.   I dissected from the exit site to the cannulation site with a tunneler.   The subcutaneous tunnel was dilated by passing a plastic dilator over the metal dissector. The wire was then unclamped and I removed the needle.  The skin tract and venotomy was dilated serially with dilators.  Finally, the dilator-sheath was placed under fluoroscopic guidance into the superior vena cava.  The dilator and wire were removed.  A 23 cm Diatek catheter was placed under fluoroscopic guidance down into the right atrium.  The sheath was broken and peeled away while holding the catheter cuff at the level of the skin.  The back end of this catheter was transected, and docked onto the tunneler.  The distal catheter was delivered through the subcutaneous tunnel.  The catheter was transected a second time, revealing the two lumens of this catheter.  The ports were docked onto these two lumens.  The catheter collar was then snapped into place.  Each port was tested by aspirating and flushing.  No resistance was noted.  Each port was then thoroughly flushed with heparinized saline.  The catheter was secured in placed with two interrupted stitches of 3-0 Nylon tied to the catheter.  The neck incision was closed with a U-stitch of 4-0 Monocryl.  The neck and chest incision were cleaned and sterile bandages applied.  Each port was then loaded with concentrated heparin (1000 Units/mL) at the manufacturer recommended volumes to each port.  Sterile caps were applied  to each port.    On completion fluoroscopy, the tips of the catheter were in the right atrium, and there was no evidence of pneumothorax.  At this point, the drapes were taken down.  The patient was repositioned and reprepped for a left arm access procedure.  Attention was turn to the prior incision, I  removed the Dermabond and then cut the underlying subcutaneous sutures.  There was active oozing throughout the skin edges and from the segment of plicated fistula. I dissected down to the distal anastomosis placed two 0 silk ties adjacent to the anastomosis, essentially leaving a vein patch on the artery.  I placed another 0 tie 2 cm proximal and then transected the fistula.  There continued to be substantial oozing from the proximal segment of fistula so I dissected out the entire proximal fistula within this incision.  I placed another 0 silk tie and transected this segment of fistula.  I then controlled bleeding with electrocautery and Avitene.  After another round of this, bleeding mostly controlled.  This patient has medical related ooze related to poor platelet disfunction.  I reapproximated the subcutaneous tissue with a double layer of 2-0 Vicryl and a single layer of 3-0 Vicryl near the skin.  The skin was reapproximated with staples.  I held pressure for a few minutes and there was no further bleeding.  A pressure dressing was applied and an ACE bandage applied to the left arm.   COMPLICATIONS: none  CONDITION: stable   Leonides Sake, MD [facility] [place] Office: [phone] Pager: [phone]  08/30/2015, 10:44 AM

## 2015-08-30 NOTE — Interval H&P Note (Signed)
[facility] [place]  History and Physical Update  The patient was interviewed and re-examined.  The patient's previous History and Physical has been reviewed and is unchanged from Dr. Bosie Helper consult except for: interval plication of L BC AVF.  See my interval progress note for additional details.  The plan is: ligation of L BC AVF, placement of TDC.  Leonides Sake, MD [facility] [place] Office: [phone] Pager: [phone]  08/30/2015, 7:29 AM

## 2015-08-30 NOTE — Anesthesia Preprocedure Evaluation (Addendum)
Anesthesia Evaluation  Patient identified by MRN, date of birth, ID band Patient awake    Reviewed: Allergy & Precautions, NPO status , Patient's Chart, lab work & pertinent test results  History of Anesthesia Complications Negative for: history of anesthetic complications  Airway Mallampati: II  TM Distance: >3 FB Neck ROM: Full    Dental  (+) Dental Advisory Given, Edentulous Upper, Edentulous Lower   Pulmonary asthma ,    Pulmonary exam normal breath sounds clear to auscultation       Cardiovascular hypertension, (-) angina+ DVT  (-) CAD and (-) Past MI negative cardio ROS Normal cardiovascular exam Rhythm:Regular Rate:Normal     Neuro/Psych PSYCHIATRIC DISORDERS Anxiety Dementia negative neurological ROS     GI/Hepatic Neg liver ROS, GERD  ,  Endo/Other  negative endocrine ROS  Renal/GU Dialysis and ESRFRenal diseaseK+ 3.8 today  negative genitourinary   Musculoskeletal negative musculoskeletal ROS (+) Arthritis , Osteoarthritis,    Abdominal   Peds negative pediatric ROS (+)  Hematology negative hematology ROS (+) Blood dyscrasia, anemia ,   Anesthesia Other Findings Day of surgery medications reviewed with the patient.  Reproductive/Obstetrics negative OB ROS                          Anesthesia Physical  Anesthesia Plan  ASA: III and emergent  Anesthesia Plan: General   Post-op Pain Management:    Induction: Intravenous  Airway Management Planned: Oral ETT  Additional Equipment:   Intra-op Plan:   Post-operative Plan: Extubation in OR  Informed Consent: I have reviewed the patients History and Physical, chart, labs and discussed the procedure including the risks, benefits and alternatives for the proposed anesthesia with the patient or authorized representative who has indicated his/her understanding and acceptance.   Dental advisory given  Plan Discussed with: CRNA  and Anesthesiologist  Anesthesia Plan Comments:       Anesthesia Quick Evaluation

## 2015-08-30 NOTE — Discharge Instructions (Signed)
° ° °  08/30/2015 [name] [id number] 12/16/1932  Surgeon(s): Fransisco Hertz, MD  Procedure(s): LIGATION OF LEFT ARM ARTERIOVENOUS  FISTULA TDC PLACEMENT.

## 2015-08-30 NOTE — Anesthesia Procedure Notes (Signed)
Procedure Name: Intubation Date/Time: 08/30/2015 10:20 AM Performed by: Alanda Amass A Pre-anesthesia Checklist: Patient identified, Timeout performed, Emergency Drugs available, Suction available and Patient being monitored Patient Re-evaluated:Patient Re-evaluated prior to inductionOxygen Delivery Method: Circle system utilized Preoxygenation: Pre-oxygenation with 100% oxygen Intubation Type: IV induction Ventilation: Mask ventilation without difficulty Laryngoscope Size: Mac and 3 Grade View: Grade I Tube type: Oral Tube size: 7.0 mm Number of attempts: 1 Airway Equipment and Method: Stylet Placement Confirmation: ETT inserted through vocal cords under direct vision,  breath sounds checked- equal and bilateral and positive ETCO2 Secured at: 21 cm Tube secured with: Tape Dental Injury: Teeth and Oropharynx as per pre-operative assessment

## 2015-08-31 ENCOUNTER — Other Ambulatory Visit: Payer: Self-pay | Admitting: *Deleted

## 2015-08-31 ENCOUNTER — Encounter (HOSPITAL_COMMUNITY): Payer: Self-pay | Admitting: Vascular Surgery

## 2015-08-31 DIAGNOSIS — Z01810 Encounter for preprocedural cardiovascular examination: Secondary | ICD-10-CM

## 2015-08-31 DIAGNOSIS — N186 End stage renal disease: Secondary | ICD-10-CM

## 2015-08-31 LAB — PATHOLOGIST SMEAR REVIEW

## 2015-09-02 ENCOUNTER — Telehealth: Payer: Self-pay | Admitting: Vascular Surgery

## 2015-09-02 NOTE — Telephone Encounter (Signed)
Spoke with patients daughter to schedule, dpm

## 2015-09-02 NOTE — Telephone Encounter (Signed)
-----   Message from Sharee Pimple, RN sent at 08/28/2015 10:52 AM EST ----- Regarding: schedule   ----- Message -----    From: Lars Mage, PA-C    Sent: 08/28/2015  10:21 AM      To: [facility] Charge Pool  F/U in 4 weeks with Dr. Hart Rochester s/p fistula plication repair aneurysm.

## 2015-09-12 ENCOUNTER — Encounter (HOSPITAL_COMMUNITY): Payer: Self-pay | Admitting: Vascular Surgery

## 2015-09-23 ENCOUNTER — Encounter: Payer: Self-pay | Admitting: Vascular Surgery

## 2015-09-29 ENCOUNTER — Ambulatory Visit (INDEPENDENT_AMBULATORY_CARE_PROVIDER_SITE_OTHER): Payer: Medicare Other | Admitting: Vascular Surgery

## 2015-09-29 ENCOUNTER — Encounter: Payer: Medicare Other | Admitting: Vascular Surgery

## 2015-09-29 ENCOUNTER — Encounter: Payer: Self-pay | Admitting: Vascular Surgery

## 2015-09-29 VITALS — BP 86/57 | HR 84 | Temp 98.9°F | Resp 14 | Ht 62.5 in | Wt 160.0 lb

## 2015-09-29 DIAGNOSIS — N186 End stage renal disease: Secondary | ICD-10-CM

## 2015-09-29 DIAGNOSIS — Z992 Dependence on renal dialysis: Secondary | ICD-10-CM

## 2015-09-29 NOTE — Progress Notes (Signed)
Subjective:     Patient ID: [name] DouglasMattie M Hegstrom, female   DOB: [birth year], 82 y.o.   MRN: [id number]  HPI  This 82 year old female is seen in follow-up regarding an attempt at preservation of her left upper arm AV fistula which was aneurysmal and had an eschar overlying it. This was attempted on February 17 but unfortunately 2 days later the patient developed bleeding from this site because of a central vein occlusion and this required ligation of the fistula and insertion of a tunneled catheter in the right IJ. She is now here for further vascular access. She is right handed. She is not a candidate for further access in the left arm.   Past Medical History  Diagnosis Date  . Gout   . Hypertension   . Hyperlipemia   . Dementia   . Anemia   . Asthma   . CKD (chronic kidney disease) stage V requiring chronic dialysis (HCC)     T/TH/Sa  . Arthritis   . DVT (deep venous thrombosis) (HCC)     LLE DVT 06/06/14    Social History  Substance Use Topics  . Smoking status: Never Smoker   . Smokeless tobacco: Former NeurosurgeonUser    Types: Snuff  . Alcohol Use: No    Family History  Problem Relation Age of Onset  . Colon cancer Neg Hx   . Diabetes Mellitus II Neg Hx     Allergies  Allergen Reactions  . Nsaids Other (See Comments)    REACTION: Avoid NSAIDS(renal failure)     Current outpatient prescriptions:  .  albuterol (PROAIR HFA) 108 (90 BASE) MCG/ACT inhaler, Inhale 2 puffs into the lungs every 6 (six) hours as needed for wheezing or shortness of breath. , Disp: , Rfl:  .  albuterol (PROVENTIL) (2.5 MG/3ML) 0.083% nebulizer solution, Take 3 mLs by nebulization daily as needed for wheezing or shortness of breath. , Disp: , Rfl: 11 .  ASPIRIN LOW DOSE 81 MG EC tablet, Take 81 mg by mouth daily. , Disp: , Rfl: 11 .  diazepam (VALIUM) 5 MG tablet, Take 0.5-1 tablets every 8 hours as needed for muscle spasm, Disp: 10 tablet, Rfl: 0 .  FLOVENT DISKUS 100 MCG/BLIST AEPB, Inhale 2 puffs into the  lungs daily as needed (for wheezing or shortness of breath). , Disp: , Rfl: 3 .  midodrine (PROAMATINE) 10 MG tablet, Take 1 tablet (10 mg total) by mouth 3 (three) times daily with meals. (Patient taking differently: Take 10 mg by mouth Every Tuesday,Thursday,and Saturday with dialysis. Tues, Thurs, and Sat), Disp: 90 tablet, Rfl: 3 .  multivitamin (RENA-VIT) TABS tablet, Take 1 tablet by mouth at bedtime. , Disp: , Rfl:  .  Nutritional Supplements (FEEDING SUPPLEMENT, NEPRO CARB STEADY,) LIQD, Take 237 mLs by mouth 2 (two) times daily between meals. (Patient taking differently: Take 237 mLs by mouth daily as needed (feeding supplement). ), Disp: 60 Can, Rfl: 3 .  RENVELA 800 MG tablet, Take 800-3,200 mg by mouth 4 (four) times daily -  with meals and at bedtime. 4 for breakfast and dinner, 2 for lunch, 1 for snacks, Disp: , Rfl: 5 .  SENSIPAR 60 MG tablet, Take 60 mg by mouth daily. , Disp: , Rfl: 6 .  HYDROcodone-acetaminophen (NORCO/VICODIN) 5-325 MG tablet, Take 1 tablet by mouth every 6 (six) hours as needed for moderate pain or severe pain. (Patient not taking: Reported on 09/29/2015), Disp: 20 tablet, Rfl: 0 .  nitroGLYCERIN (NITROSTAT) 0.4 MG SL tablet,  Place 0.4 mg under the tongue every 5 (five) minutes as needed for chest pain., Disp: , Rfl:   Filed Vitals:   09/29/15 1429  BP: 86/57  Pulse: 84  Temp: 98.9 F (37.2 C)  Resp: 14  Height: 5' 2.5" (1.588 m)  Weight: 160 lb (72.576 kg)  SpO2: 97%    Body mass index is 28.78 kg/(m^2).          Review of Systems  Denies chest pain, dyspnea on exertion, PND, orthopnea, hemoptysis. Does have dementia.     Objective:   Physical Exam BP 86/57 mmHg  Pulse 84  Temp(Src) 98.9 F (37.2 C)  Resp 14  Ht 5' 2.5" (1.588 m)  Wt 160 lb (72.576 kg)  BMI 28.78 kg/m2  SpO2 97%   Gen. Elderly female no apparent distress alert and oriented 3 Lungs no rhonchi or wheezing Cardiovascular regular rhythm no murmurs  upper chest has  tunneled catheter in the right IJ.  Left upper extremity has well-healed antecubital incision where fistula was ligated with skin staples in place. These were removed. The fistula is thrombosed. Left hand is adequately perfused.   right upper extremity was then imaged at the bedside using the SonoSite ultrasound exam. Cephalic and basilic veins are  Very small and not satisfactory for fistula  Creation in the right arm.     Assessment:      end-stage renal disease needs vascular access and right upper extremity-veins and adequate for fistula creation     Plan:      plan insertion of right forearm or upper arm AV graft on Wednesday March 29 as outpatient  And appears brachial vein in the antecubital area may be large enough to insert forearm loop graft. Potential for steal syndrome was discussed and patient understands as does the family.

## 2015-09-30 ENCOUNTER — Other Ambulatory Visit: Payer: Self-pay

## 2015-10-07 ENCOUNTER — Encounter (HOSPITAL_COMMUNITY): Payer: Self-pay | Admitting: *Deleted

## 2015-10-07 NOTE — Progress Notes (Signed)
Spoke with pt's daughter, [name] for pre-op call. Pt did not want to talk today. [name] denies that pt has cardiac history, chest pain or sob. Pre-op instructions given to [facility] Surgery[facility]le.

## 2015-10-09 ENCOUNTER — Encounter (HOSPITAL_COMMUNITY): Payer: Self-pay | Admitting: *Deleted

## 2015-10-09 ENCOUNTER — Ambulatory Visit (HOSPITAL_COMMUNITY): Payer: Medicare Other | Admitting: Certified Registered Nurse Anesthetist

## 2015-10-09 ENCOUNTER — Encounter (HOSPITAL_COMMUNITY)
Admission: RE | Disposition: A | Discharge status: Home / Self Care | Payer: Self-pay | Source: Ambulatory Visit | Attending: Vascular Surgery

## 2015-10-09 ENCOUNTER — Ambulatory Visit (HOSPITAL_COMMUNITY)
Admission: RE | Admit: 2015-10-09 | Discharge: 2015-10-09 | Disposition: A | Discharge status: Home / Self Care | Payer: Medicare Other | Source: Ambulatory Visit | Attending: Vascular Surgery | Admitting: Vascular Surgery

## 2015-10-09 DIAGNOSIS — M1990 Unspecified osteoarthritis, unspecified site: Secondary | ICD-10-CM | POA: Diagnosis not present

## 2015-10-09 DIAGNOSIS — N186 End stage renal disease: Secondary | ICD-10-CM | POA: Diagnosis not present

## 2015-10-09 DIAGNOSIS — K219 Gastro-esophageal reflux disease without esophagitis: Secondary | ICD-10-CM | POA: Diagnosis not present

## 2015-10-09 DIAGNOSIS — F419 Anxiety disorder, unspecified: Secondary | ICD-10-CM | POA: Insufficient documentation

## 2015-10-09 DIAGNOSIS — Z7982 Long term (current) use of aspirin: Secondary | ICD-10-CM | POA: Diagnosis not present

## 2015-10-09 DIAGNOSIS — M109 Gout, unspecified: Secondary | ICD-10-CM | POA: Diagnosis not present

## 2015-10-09 DIAGNOSIS — Z79899 Other long term (current) drug therapy: Secondary | ICD-10-CM | POA: Insufficient documentation

## 2015-10-09 DIAGNOSIS — J45909 Unspecified asthma, uncomplicated: Secondary | ICD-10-CM | POA: Insufficient documentation

## 2015-10-09 DIAGNOSIS — F0390 Unspecified dementia without behavioral disturbance: Secondary | ICD-10-CM | POA: Insufficient documentation

## 2015-10-09 DIAGNOSIS — Z886 Allergy status to analgesic agent status: Secondary | ICD-10-CM | POA: Diagnosis not present

## 2015-10-09 DIAGNOSIS — I120 Hypertensive chronic kidney disease with stage 5 chronic kidney disease or end stage renal disease: Secondary | ICD-10-CM | POA: Insufficient documentation

## 2015-10-09 DIAGNOSIS — E785 Hyperlipidemia, unspecified: Secondary | ICD-10-CM | POA: Diagnosis not present

## 2015-10-09 DIAGNOSIS — Z86718 Personal history of other venous thrombosis and embolism: Secondary | ICD-10-CM | POA: Insufficient documentation

## 2015-10-09 HISTORY — PX: AV FISTULA PLACEMENT: SHX1204

## 2015-10-09 LAB — POCT I-STAT 4, (NA,K, GLUC, HGB,HCT)
GLUCOSE: 78 mg/dL (ref 65–99)
HCT: 31 % — ABNORMAL LOW (ref 36.0–46.0)
Hemoglobin: 10.5 g/dL — ABNORMAL LOW (ref 12.0–15.0)
Potassium: 3.2 mmol/L — ABNORMAL LOW (ref 3.5–5.1)
Sodium: 139 mmol/L (ref 135–145)

## 2015-10-09 SURGERY — INSERTION OF ARTERIOVENOUS (AV) GORE-TEX GRAFT ARM
Anesthesia: Monitor Anesthesia Care | Site: Arm Upper | Laterality: Right

## 2015-10-09 MED ORDER — ACETAMINOPHEN 160 MG/5ML PO SOLN
325.0000 mg | ORAL | Status: DC | PRN
  Filled 2015-10-09: qty 20.3

## 2015-10-09 MED ORDER — FENTANYL CITRATE (PF) 100 MCG/2ML IJ SOLN: 25.0000 ug | INTRAMUSCULAR | Status: DC | PRN

## 2015-10-09 MED ORDER — LIDOCAINE HCL (PF) 1 % IJ SOLN
INTRAMUSCULAR | Status: AC
  Filled 2015-10-09: qty 30

## 2015-10-09 MED ORDER — OXYCODONE HCL 5 MG/5ML PO SOLN: 5.0000 mg | Freq: Once | ORAL | Status: DC | PRN

## 2015-10-09 MED ORDER — FENTANYL CITRATE (PF) 250 MCG/5ML IJ SOLN
INTRAMUSCULAR | Status: AC
  Filled 2015-10-09: qty 5

## 2015-10-09 MED ORDER — ONDANSETRON HCL 4 MG/2ML IJ SOLN
INTRAMUSCULAR | Status: DC | PRN
  Administered 2015-10-09: 4 mg via INTRAVENOUS

## 2015-10-09 MED ORDER — LIDOCAINE-EPINEPHRINE (PF) 1 %-1:200000 IJ SOLN
INTRAMUSCULAR | Status: DC | PRN
  Administered 2015-10-09: 30 mL

## 2015-10-09 MED ORDER — HYDROCODONE-ACETAMINOPHEN 5-325 MG PO TABS: 1.0000 | ORAL_TABLET | Freq: Four times a day (QID) | ORAL | Status: DC | PRN

## 2015-10-09 MED ORDER — ACETAMINOPHEN 325 MG PO TABS: 325.0000 mg | ORAL_TABLET | ORAL | Status: DC | PRN

## 2015-10-09 MED ORDER — 0.9 % SODIUM CHLORIDE (POUR BTL) OPTIME
TOPICAL | Status: DC | PRN
  Administered 2015-10-09: 1000 mL

## 2015-10-09 MED ORDER — SODIUM CHLORIDE 0.9 % IV SOLN
INTRAVENOUS | Status: DC
  Administered 2015-10-09 (×2): via INTRAVENOUS

## 2015-10-09 MED ORDER — FENTANYL CITRATE (PF) 100 MCG/2ML IJ SOLN
INTRAMUSCULAR | Status: DC | PRN
  Administered 2015-10-09: 50 ug via INTRAVENOUS

## 2015-10-09 MED ORDER — DEXTROSE 5 % IV SOLN
1.5000 g | INTRAVENOUS | Status: AC
  Administered 2015-10-09: 1.5 g via INTRAVENOUS

## 2015-10-09 MED ORDER — SODIUM CHLORIDE 0.9 % IV SOLN
INTRAVENOUS | Status: DC | PRN
  Administered 2015-10-09: 10:00:00

## 2015-10-09 MED ORDER — LIDOCAINE-EPINEPHRINE (PF) 1 %-1:200000 IJ SOLN
INTRAMUSCULAR | Status: AC
  Filled 2015-10-09: qty 30

## 2015-10-09 MED ORDER — OXYCODONE HCL 5 MG PO TABS: 5.0000 mg | ORAL_TABLET | Freq: Once | ORAL | Status: DC | PRN

## 2015-10-09 MED ORDER — PROPOFOL 500 MG/50ML IV EMUL
INTRAVENOUS | Status: DC | PRN
  Administered 2015-10-09: 75 ug/kg/min via INTRAVENOUS

## 2015-10-09 MED ORDER — DEXTROSE 5 % IV SOLN
INTRAVENOUS | Status: AC
  Filled 2015-10-09: qty 1.5

## 2015-10-09 MED ORDER — LIDOCAINE HCL 1 % IJ SOLN
INTRAMUSCULAR | Status: DC | PRN
  Administered 2015-10-09: 30 mL

## 2015-10-09 MED ORDER — CHLORHEXIDINE GLUCONATE CLOTH 2 % EX PADS: 6.0000 | MEDICATED_PAD | Freq: Once | CUTANEOUS | Status: DC

## 2015-10-09 MED ORDER — PHENYLEPHRINE HCL 10 MG/ML IJ SOLN
INTRAMUSCULAR | Status: DC | PRN
  Administered 2015-10-09: 80 ug via INTRAVENOUS

## 2015-10-09 SURGICAL SUPPLY — 25 items
ARMBAND PINK RESTRICT EXTREMIT (MISCELLANEOUS) ×3 IMPLANT
CANISTER SUCTION 2500CC (MISCELLANEOUS) ×3 IMPLANT
CLIP TI MEDIUM 6 (CLIP) ×3 IMPLANT
CLIP TI WIDE RED SMALL 6 (CLIP) ×3 IMPLANT
ELECT REM PT RETURN 9FT ADLT (ELECTROSURGICAL) ×3
ELECTRODE REM PT RTRN 9FT ADLT (ELECTROSURGICAL) ×1 IMPLANT
GLOVE BIOGEL PI IND STRL 6.5 (GLOVE) ×4 IMPLANT
GLOVE BIOGEL PI IND STRL 7.0 (GLOVE) ×3 IMPLANT
GLOVE BIOGEL PI INDICATOR 6.5 (GLOVE) ×8
GLOVE BIOGEL PI INDICATOR 7.0 (GLOVE) ×6
GOWN STRL REUS W/ TWL LRG LVL3 (GOWN DISPOSABLE) ×3 IMPLANT
GOWN STRL REUS W/TWL LRG LVL3 (GOWN DISPOSABLE) ×6
GRAFT GORETEX STND 6X20 (Vascular Products) ×3 IMPLANT
GRAFT GORETEXSTD 6X20 (Vascular Products) ×1 IMPLANT
KIT BASIN OR (CUSTOM PROCEDURE TRAY) ×3 IMPLANT
KIT ROOM TURNOVER OR (KITS) ×3 IMPLANT
LIQUID BAND (GAUZE/BANDAGES/DRESSINGS) ×3 IMPLANT
NS IRRIG 1000ML POUR BTL (IV SOLUTION) ×3 IMPLANT
PACK CV ACCESS (CUSTOM PROCEDURE TRAY) ×3 IMPLANT
PAD ARMBOARD 7.5X6 YLW CONV (MISCELLANEOUS) ×6 IMPLANT
SUT PROLENE 6 0 BV (SUTURE) ×6 IMPLANT
SUT VIC AB 3-0 SH 27 (SUTURE) ×4
SUT VIC AB 3-0 SH 27X BRD (SUTURE) ×2 IMPLANT
UNDERPAD 30X30 INCONTINENT (UNDERPADS AND DIAPERS) ×3 IMPLANT
WATER STERILE IRR 1000ML POUR (IV SOLUTION) ×3 IMPLANT

## 2015-10-09 NOTE — Anesthesia Postprocedure Evaluation (Signed)
Anesthesia Post Note  Patient: Marijo M Merfeld  Procedure(s) Performed: Procedure(s) (LRB): INSERTION OF ARTERIOVENOUS (AV) GORE-TEX GRAFT RIGHT UPPER ARM (Right)  Patient location during evaluation: PACU Anesthesia Type: MAC Level of consciousness: awake Pain management: pain level controlled Vital Signs Assessment: post-procedure vital signs reviewed and stable Respiratory status: spontaneous breathing Cardiovascular status: stable Postop Assessment: no signs of nausea or vomiting Anesthetic complications: no    Last Vitals:  Filed Vitals:   10/09/15 1145 10/09/15 1202  BP: 149/58 166/75  Pulse: 70 75  Temp:    Resp: 15 16    Last Pain: There were no vitals filed for this visit.               Winson Eichorn

## 2015-10-09 NOTE — Anesthesia Preprocedure Evaluation (Addendum)
Anesthesia Evaluation  Patient identified by MRN, date of birth, ID band Patient awake    Reviewed: Allergy & Precautions, NPO status , Patient's Chart, lab work & pertinent test results  History of Anesthesia Complications Negative for: history of anesthetic complications  Airway Mallampati: II  TM Distance: >3 FB Neck ROM: Full    Dental  (+) Dental Advisory Given, Edentulous Upper, Edentulous Lower   Pulmonary shortness of breath, asthma ,    Pulmonary exam normal breath sounds clear to auscultation       Cardiovascular hypertension, (-) angina+ DVT  (-) CAD and (-) Past MI negative cardio ROS Normal cardiovascular exam Rhythm:Regular Rate:Normal     Neuro/Psych PSYCHIATRIC DISORDERS Anxiety Dementia negative neurological ROS     GI/Hepatic Neg liver ROS, GERD  ,  Endo/Other  negative endocrine ROS  Renal/GU Dialysis and ESRFRenal diseaseK+ 3.8 today  negative genitourinary   Musculoskeletal negative musculoskeletal ROS (+) Arthritis , Osteoarthritis,    Abdominal   Peds negative pediatric ROS (+)  Hematology negative hematology ROS (+) Blood dyscrasia, anemia ,   Anesthesia Other Findings Day of surgery medications reviewed with the patient.  Reproductive/Obstetrics negative OB ROS                            Anesthesia Physical  Anesthesia Plan  ASA: III and emergent  Anesthesia Plan: MAC   Post-op Pain Management:    Induction: Intravenous  Airway Management Planned: Simple Face Mask  Additional Equipment: None  Intra-op Plan:   Post-operative Plan:   Informed Consent: I have reviewed the patients History and Physical, chart, labs and discussed the procedure including the risks, benefits and alternatives for the proposed anesthesia with the patient or authorized representative who has indicated his/her understanding and acceptance.   Dental advisory given  Plan  Discussed with: CRNA, Anesthesiologist and Surgeon  Anesthesia Plan Comments:        Anesthesia Quick Evaluation

## 2015-10-09 NOTE — Transfer of Care (Signed)
Immediate Anesthesia Transfer of Care Note  Patient: [name]  Procedure(s) Performed: Procedure(s): INSERTION OF ARTERIOVENOUS (AV) GORE-TEX GRAFT RIGHT UPPER ARM (Right)  Patient Location: PACU  Anesthesia Type:MAC  Level of Consciousness: awake, alert  and oriented  Airway & Oxygen Therapy: Patient Spontanous Breathing  Post-op Assessment: Report given to RN and Post -op Vital signs reviewed and stable  Post vital signs: Reviewed and stable  Last Vitals:  Filed Vitals:   10/09/15 0735  BP: 127/60  Pulse: 78  Temp: 36.9 C  Resp: 16    Complications: No apparent anesthesia complications

## 2015-10-09 NOTE — Interval H&P Note (Signed)
History and Physical Interval Note:  10/09/2015 9:15 AM  [name]  has presented today for surgery, with the diagnosis of End Stage Renal Disease N18.6  The various methods of treatment have been discussed with the patient and family. After consideration of risks, benefits and other options for treatment, the patient has consented to  Procedure(s): INSERTION OF ARTERIOVENOUS (AV) GORE-TEX GRAFT ARM (Right) as a surgical intervention .  The patient's history has been reviewed, patient examined, no change in status, stable for surgery.  I have reviewed the patient's chart and labs.  Questions were answered to the patient's satisfaction.     Josephina GipLawson, [name]

## 2015-10-09 NOTE — OR Nursing (Signed)
Dr. Hart RochesterLawson at bedside. Pt states she has a little numbness and tingling in the right hand.  Strong grip and 2+ radial pulse.  Denies any pain.  Positive bruit and thrill to fistula in upper right arm.  Dr. Hart RochesterLawson stated to let us know if the numbness/tingling gets worse or any pain starts.

## 2015-10-09 NOTE — OR Nursing (Signed)
Pt states numbess and tingling are starting to " go away some."

## 2015-10-09 NOTE — OR Nursing (Signed)
Glasses returned to patient and she placed both sets of dentures in her mouth.

## 2015-10-09 NOTE — H&P (View-Only) (Signed)
Subjective:     Patient ID: [name], female   DOB: 10/09/1932, 82 y.o.   MRN: [id number]  HPI  This 82-year-old female is seen in follow-up regarding an attempt at preservation of her left upper arm AV fistula which was aneurysmal and had an eschar overlying it. This was attempted on February 17 but unfortunately 2 days later the patient developed bleeding from this site because of a central vein occlusion and this required ligation of the fistula and insertion of a tunneled catheter in the right IJ. She is now here for further vascular access. She is right handed. She is not a candidate for further access in the left arm.   Past Medical History  Diagnosis Date  . Gout   . Hypertension   . Hyperlipemia   . Dementia   . Anemia   . Asthma   . CKD (chronic kidney disease) stage V requiring chronic dialysis (HCC)     T/TH/Sa  . Arthritis   . DVT (deep venous thrombosis) (HCC)     LLE DVT 06/06/14    Social History  Substance Use Topics  . Smoking status: Never Smoker   . Smokeless tobacco: Former User    Types: Snuff  . Alcohol Use: No    Family History  Problem Relation Age of Onset  . Colon cancer Neg Hx   . Diabetes Mellitus II Neg Hx     Allergies  Allergen Reactions  . Nsaids Other (See Comments)    REACTION: Avoid NSAIDS(renal failure)     Current outpatient prescriptions:  .  albuterol (PROAIR HFA) 108 (90 BASE) MCG/ACT inhaler, Inhale 2 puffs into the lungs every 6 (six) hours as needed for wheezing or shortness of breath. , Disp: , Rfl:  .  albuterol (PROVENTIL) (2.5 MG/3ML) 0.083% nebulizer solution, Take 3 mLs by nebulization daily as needed for wheezing or shortness of breath. , Disp: , Rfl: 11 .  ASPIRIN LOW DOSE 81 MG EC tablet, Take 81 mg by mouth daily. , Disp: , Rfl: 11 .  diazepam (VALIUM) 5 MG tablet, Take 0.5-1 tablets every 8 hours as needed for muscle spasm, Disp: 10 tablet, Rfl: 0 .  FLOVENT DISKUS 100 MCG/BLIST AEPB, Inhale 2 puffs into the  lungs daily as needed (for wheezing or shortness of breath). , Disp: , Rfl: 3 .  midodrine (PROAMATINE) 10 MG tablet, Take 1 tablet (10 mg total) by mouth 3 (three) times daily with meals. (Patient taking differently: Take 10 mg by mouth Every Tuesday,Thursday,and Saturday with dialysis. Tues, Thurs, and Sat), Disp: 90 tablet, Rfl: 3 .  multivitamin (RENA-VIT) TABS tablet, Take 1 tablet by mouth at bedtime. , Disp: , Rfl:  .  Nutritional Supplements (FEEDING SUPPLEMENT, NEPRO CARB STEADY,) LIQD, Take 237 mLs by mouth 2 (two) times daily between meals. (Patient taking differently: Take 237 mLs by mouth daily as needed (feeding supplement). ), Disp: 60 Can, Rfl: 3 .  RENVELA 800 MG tablet, Take 800-3,200 mg by mouth 4 (four) times daily -  with meals and at bedtime. 4 for breakfast and dinner, 2 for lunch, 1 for snacks, Disp: , Rfl: 5 .  SENSIPAR 60 MG tablet, Take 60 mg by mouth daily. , Disp: , Rfl: 6 .  HYDROcodone-acetaminophen (NORCO/VICODIN) 5-325 MG tablet, Take 1 tablet by mouth every 6 (six) hours as needed for moderate pain or severe pain. (Patient not taking: Reported on 09/29/2015), Disp: 20 tablet, Rfl: 0 .  nitroGLYCERIN (NITROSTAT) 0.4 MG SL tablet,   Place 0.4 mg under the tongue every 5 (five) minutes as needed for chest pain., Disp: , Rfl:   Filed Vitals:   09/29/15 1429  BP: 86/57  Pulse: 84  Temp: 98.9 F (37.2 C)  Resp: 14  Height: 5' 2.5" (1.588 m)  Weight: 160 lb (72.576 kg)  SpO2: 97%    Body mass index is 28.78 kg/(m^2).          Review of Systems  Denies chest pain, dyspnea on exertion, PND, orthopnea, hemoptysis. Does have dementia.     Objective:   Physical Exam BP 86/57 mmHg  Pulse 84  Temp(Src) 98.9 F (37.2 C)  Resp 14  Ht 5' 2.5" (1.588 m)  Wt 160 lb (72.576 kg)  BMI 28.78 kg/m2  SpO2 97%   Gen. Elderly female no apparent distress alert and oriented 3 Lungs no rhonchi or wheezing Cardiovascular regular rhythm no murmurs  upper chest has  tunneled catheter in the right IJ.  Left upper extremity has well-healed antecubital incision where fistula was ligated with skin staples in place. These were removed. The fistula is thrombosed. Left hand is adequately perfused.   right upper extremity was then imaged at the bedside using the SonoSite ultrasound exam. Cephalic and basilic veins are  Very small and not satisfactory for fistula  Creation in the right arm.     Assessment:      end-stage renal disease needs vascular access and right upper extremity-veins and adequate for fistula creation     Plan:      plan insertion of right forearm or upper arm AV graft on Wednesday March 29 as outpatient  And appears brachial vein in the antecubital area may be large enough to insert forearm loop graft. Potential for steal syndrome was discussed and patient understands as does the family.       

## 2015-10-09 NOTE — Op Note (Signed)
OPERATIVE REPORT  Date of Surgery: 10/09/2015  Surgeon: Josephina GipJames Azelia Reiger, MD  Assistant: Karsten RoKim Trinh PA  Pre-op Diagnosis: End Stage Renal Disease N18.6  Post-op Diagnosis: End Stage Renal Disease N18.6  Procedure: Procedure(s): INSERTION OF ARTERIOVENOUS (AV) GORE-TEX GRAFT RIGHT UPPER ARM--brachial artery to brachial vein-6 mm  Anesthesia: Mac  EBL: Minimal  Complications: None  Procedure Details: The patient was taken the operative placed in supine position at which time the right upper extremity was prepped and draped in routine sterile manner. After infiltration with 1% Xylocaine with epinephrine short longitudinal incision was made in the distal upper arm brachial artery exposed. It was a relatively small vessel about 3 mm in size but had an excellent pulse. There was a palpable pulse in the radial artery at the wrist. A second incision was made just proximal to the axilla. A high brachial vein was dissected free up to its junction with the axillary vein. It was a 5 mm vein. After infiltration with 1% Xylocaine with epinephrine a curvilinear tunnel was created on the anterior aspect of the arm and Ace or standard wall Gore-Tex graft liver through the tunnel. No heparin was given. Brachial artery was occluded proximal and distally with Vesseloops over 15 blade extended with the Potts scissors. Gore-Tex was then anastomosed end to side the brachial artery with 6-0 Prolene. Attention turned to the axilla where the high brachial vein was ligated distally with 2-0 silk tie and divided slightly spatulated and the Gore-Tex was spatulated and end end anastomosis done with 6-0 Prolene. The proximal vein would accept a 5 mm dilator. Following completion of anastomosis open excellent pulse and thrill in the graft. There was slight diminution of flow in the radial artery distally with the graft open which improved with compression of the graft. Adequate hemostasis was achieved the wounds both closed in  layers with Vicryl subcuticular fashion with Dermabond patient taken to recovery room in satisfactory condition   Josephina GipJames Molly Maselli, MD 10/09/2015 11:06 AM

## 2015-10-12 ENCOUNTER — Encounter (HOSPITAL_COMMUNITY): Payer: Self-pay | Admitting: Vascular Surgery

## 2015-10-13 ENCOUNTER — Encounter: Payer: Self-pay | Admitting: *Deleted

## 2015-10-13 ENCOUNTER — Ambulatory Visit (INDEPENDENT_AMBULATORY_CARE_PROVIDER_SITE_OTHER): Payer: Self-pay | Admitting: Vascular Surgery

## 2015-10-13 ENCOUNTER — Encounter: Payer: Self-pay | Admitting: Vascular Surgery

## 2015-10-13 VITALS — BP 80/52 | HR 96 | Temp 97.2°F | Resp 20 | Ht 64.0 in | Wt 160.0 lb

## 2015-10-13 DIAGNOSIS — N186 End stage renal disease: Secondary | ICD-10-CM

## 2015-10-13 DIAGNOSIS — Z992 Dependence on renal dialysis: Secondary | ICD-10-CM

## 2015-10-13 NOTE — Progress Notes (Signed)
Subjective:    Patient ID: [name] DouglasMattie M Wnek, female DOB: 09/14/1932, 82 y.o.   MRN: [id number]   HPI:  this 82 year old female had right upper arm AV graft inserted by me on 10/09/2015. She was in the dialysis center today and they were concerned about this. She has had some discomfort and swelling in the right upper arm. She denies any pain or numbness in the right hand but does have some tingling in the fingers. She has had no fever or chills.   ROS:    Objective:  Physical Exam: BP 80/52 mmHg  Pulse 96  Temp(Src) 97.2 F (36.2 C) (Oral)  Resp 20  Ht 5\' 4"  (1.626 m)  Wt 160 lb (72.576 kg)  BMI 27.45 kg/m2  SpO2 96%    Gen. Alert and oriented 3 Right upper extremity with well healing distal upper arm and axillary wounds. Moderate ecchymosis on the medial aspect of the right upper arm extending posteriorly. Good pulse and palpable thrill over the graft. 1+ edema in the upper arm. Minimal edema in the distal forearm. Good Doppler flow right radial artery    Assessment:      right upper arm AV graft with moderate ecchymosis and edema and upper arm but this is resolving Mild tingling in fingers with good Doppler flow    Plan:      elevate right arm and return to see me in 2 weeks for continued follow-up If patient develops pain in right hand or total numbness right hand she will be in touch with me  if this graft is not successful the only other option would be a thigh graft

## 2015-10-20 ENCOUNTER — Encounter: Payer: Self-pay | Admitting: Vascular Surgery

## 2015-10-20 ENCOUNTER — Ambulatory Visit (INDEPENDENT_AMBULATORY_CARE_PROVIDER_SITE_OTHER): Payer: Self-pay | Admitting: Vascular Surgery

## 2015-10-20 ENCOUNTER — Encounter: Payer: Self-pay | Admitting: *Deleted

## 2015-10-20 VITALS — BP 97/63 | HR 83 | Temp 99.0°F | Ht 64.0 in | Wt 166.7 lb

## 2015-10-20 DIAGNOSIS — Z992 Dependence on renal dialysis: Secondary | ICD-10-CM

## 2015-10-20 DIAGNOSIS — N186 End stage renal disease: Secondary | ICD-10-CM

## 2015-10-20 NOTE — Progress Notes (Signed)
Subjective:     Patient ID: [name], female   DOB: February 20, 1933, 82 y.o.   MRN: [id number]  HPI this 82 year old female returns for continued follow-up regarding her recent right upper arm AV graft insertion done by me on 10/09/2015. She is having less pain overlying the graft but has had increased swelling from the elbow area to the hand. She denies numbness in the right hand. She has some mild discomfort on the dorsum of the hand at the swelling but no diffuse throbbing discomfort. She is being dialyzed through a catheter in her right IJ. She is receiving vancomycin on dialysis.   Review of Systems     Objective:   Physical Exam BP 97/63 mmHg  Pulse 83  Temp(Src) 99 F (37.2 C) (Oral)  Ht 5\' 4"  (1.626 m)  Wt 166 lb 11.2 oz (75.615 kg)  BMI 28.60 kg/m2  SpO2 95%   Gen. Elderly female no apparent distress alert and oriented 3 Right upper extremity with excellent flow in  brachial-axillary AV graft with decreased inflammation surrounding graft compared to last week's exam. 1+ edema from antecubital area 2 fingers. Good sensation in right hand and good strength. Excellent Doppler flow in right radial and ulnar artery. Excellent Doppler flow in graft.     Assessment:      am encouraged by decreased inflammation surrounding graft. This does not appear infected. She could have a central vein stenosis causing the edema although the edema in the upper arm seems to be improving.     Plan:      once again I stressed 2 the patient and her family to be patient regarding this since our options are not good. The next access will be a right thigh graft. This graft has excellent flow with decreased inflammation. If she does continue to have significant distal edema will need to get a shuntogram at some point in time. I will see her back in follow-up in 3 weeks.

## 2015-11-03 ENCOUNTER — Ambulatory Visit: Payer: Medicare Other | Admitting: Vascular Surgery

## 2015-11-12 ENCOUNTER — Encounter: Payer: Self-pay | Admitting: Vascular Surgery

## 2015-11-17 ENCOUNTER — Ambulatory Visit (INDEPENDENT_AMBULATORY_CARE_PROVIDER_SITE_OTHER): Payer: Self-pay | Admitting: Vascular Surgery

## 2015-11-17 ENCOUNTER — Encounter: Payer: Self-pay | Admitting: Vascular Surgery

## 2015-11-17 VITALS — BP 99/63 | HR 81 | Temp 97.5°F | Ht 64.0 in | Wt 164.9 lb

## 2015-11-17 DIAGNOSIS — N186 End stage renal disease: Secondary | ICD-10-CM

## 2015-11-17 DIAGNOSIS — Z992 Dependence on renal dialysis: Secondary | ICD-10-CM

## 2015-11-17 NOTE — Progress Notes (Signed)
Subjective:     Patient ID: [name], female   DOB: Feb 25, 1933, 82 y.o.   MRN: [id number]  HPIthis 82 year old female with end-stage renal disease had right upper arm AV graft inserted by me on 10/09/2015. She returns today for follow-up. She has had some mild-to-moderate edema in the right upper extremity which is slowly improving. The graft has been utilize 3 or 4 times and has functioned well. She denies any pain or significant numbness in the right hand since the procedure.   Review of Systems     Objective:   Physical Exam BP 99/63 mmHg  Pulse 81  Temp(Src) 97.5 F (36.4 C) (Oral)  Ht 5\' 4"  (1.626 m)  Wt 164 lb 14.4 oz (74.798 kg)  BMI 28.29 kg/m2  SpO2 99%  Gen. Elderly female no apparent distress alert and oriented 3 Incisions in right upper arm nicely healed. 1+ edema throughout right upper extremity. 2+ radial pulse palpable with well-perfused right hand. Pulse and palpable thrill with audible bruit in the graft in proximal upper arm.     Assessment:     Nicely functioning right brachial-axillary AV Gore-Tex graft which has been successfully utilized for 3 or 4 occasions     Plan:     Tunneled catheter will be removed this week Continue using graft and we will see patient back on a when necessary basis

## 2016-08-17 ENCOUNTER — Ambulatory Visit (INDEPENDENT_AMBULATORY_CARE_PROVIDER_SITE_OTHER): Payer: Medicare Other | Admitting: Podiatry

## 2016-08-17 DIAGNOSIS — L608 Other nail disorders: Secondary | ICD-10-CM | POA: Diagnosis not present

## 2016-08-17 DIAGNOSIS — L603 Nail dystrophy: Secondary | ICD-10-CM | POA: Diagnosis not present

## 2016-08-17 DIAGNOSIS — M79609 Pain in unspecified limb: Secondary | ICD-10-CM

## 2016-08-17 DIAGNOSIS — B351 Tinea unguium: Secondary | ICD-10-CM

## 2016-08-29 NOTE — Progress Notes (Signed)
   SUBJECTIVE Patient  presents to office today complaining of elongated, thickened nails. Pain while ambulating in shoes. Patient is unable to trim their own nails. Patient is also concerned about the long painful incurvated toenail to the third digit left foot which is been going on for approximately 1 year now.  OBJECTIVE General Patient is awake, alert, and oriented x 3 and in no acute distress. Derm Skin is dry and supple bilateral. Negative open lesions or macerations. Remaining integument unremarkable. Nails are tender, long, thickened and dystrophic with subungual debris, consistent with onychomycosis, 1-5 left foot. No signs of infection noted. Vasc  DP and PT pedal pulses palpable bilaterally. Temperature gradient within normal limits.  Neuro Epicritic and protective threshold sensation diminished bilaterally.  Musculoskeletal Exam No symptomatic pedal deformities noted bilateral. Muscular strength within normal limits.  ASSESSMENT 1. Onychodystrophic nails 1-5 left foot with hyperkeratosis of nails.  2. Onychomycosis of nail due to dermatophyte left foot  PLAN OF CARE 1. Patient evaluated today.  2. Instructed to maintain good pedal hygiene and foot care.  3. Mechanical debridement of nails 1-5 to the left foot performed using a nail nipper. Filed with dremel without incident.  4. Return to clinic in 3 mos.    Felecia ShellingBrent M. Evans, DPM [facility]  Dr. Felecia ShellingBrent M. Evans, DPM    [address]                                        [place], [place] [address]                Office [phone]  Fax [phone]

## 2016-09-13 ENCOUNTER — Emergency Department (HOSPITAL_COMMUNITY)
Admission: EM | Admit: 2016-09-13 | Discharge: 2016-09-13 | Disposition: A | Discharge status: Home / Self Care | Payer: Medicare Other | Attending: Emergency Medicine | Admitting: Emergency Medicine

## 2016-09-13 ENCOUNTER — Encounter (HOSPITAL_COMMUNITY): Payer: Self-pay | Admitting: Emergency Medicine

## 2016-09-13 ENCOUNTER — Emergency Department (HOSPITAL_COMMUNITY): Payer: Medicare Other

## 2016-09-13 ENCOUNTER — Other Ambulatory Visit: Payer: Self-pay | Admitting: Radiology

## 2016-09-13 DIAGNOSIS — Z7982 Long term (current) use of aspirin: Secondary | ICD-10-CM | POA: Diagnosis not present

## 2016-09-13 DIAGNOSIS — Z79899 Other long term (current) drug therapy: Secondary | ICD-10-CM | POA: Diagnosis not present

## 2016-09-13 DIAGNOSIS — N186 End stage renal disease: Secondary | ICD-10-CM | POA: Insufficient documentation

## 2016-09-13 DIAGNOSIS — Y828 Other medical devices associated with adverse incidents: Secondary | ICD-10-CM | POA: Diagnosis not present

## 2016-09-13 DIAGNOSIS — Z992 Dependence on renal dialysis: Secondary | ICD-10-CM

## 2016-09-13 DIAGNOSIS — T8241XA Breakdown (mechanical) of vascular dialysis catheter, initial encounter: Secondary | ICD-10-CM

## 2016-09-13 DIAGNOSIS — J45909 Unspecified asthma, uncomplicated: Secondary | ICD-10-CM | POA: Insufficient documentation

## 2016-09-13 DIAGNOSIS — Z91040 Latex allergy status: Secondary | ICD-10-CM | POA: Diagnosis not present

## 2016-09-13 DIAGNOSIS — T8249XA Other complication of vascular dialysis catheter, initial encounter: Secondary | ICD-10-CM | POA: Diagnosis present

## 2016-09-13 DIAGNOSIS — I120 Hypertensive chronic kidney disease with stage 5 chronic kidney disease or end stage renal disease: Secondary | ICD-10-CM | POA: Insufficient documentation

## 2016-09-13 DIAGNOSIS — T82868A Thrombosis of vascular prosthetic devices, implants and grafts, initial encounter: Secondary | ICD-10-CM | POA: Diagnosis not present

## 2016-09-13 LAB — BASIC METABOLIC PANEL
ANION GAP: 12 (ref 5–15)
BUN: 20 mg/dL (ref 6–20)
CO2: 28 mmol/L (ref 22–32)
Calcium: 8.2 mg/dL — ABNORMAL LOW (ref 8.9–10.3)
Chloride: 99 mmol/L — ABNORMAL LOW (ref 101–111)
Creatinine, Ser: 5.7 mg/dL — ABNORMAL HIGH (ref 0.44–1.00)
GFR, EST AFRICAN AMERICAN: 7 mL/min — AB (ref >60)
GFR, EST NON AFRICAN AMERICAN: 6 mL/min — AB (ref >60)
GLUCOSE: 112 mg/dL — AB (ref 65–99)
POTASSIUM: 3.4 mmol/L — AB (ref 3.5–5.1)
Sodium: 139 mmol/L (ref 135–145)

## 2016-09-13 LAB — CBC
HEMATOCRIT: 30.6 % — AB (ref 36.0–46.0)
Hemoglobin: 9.8 g/dL — ABNORMAL LOW (ref 12.0–15.0)
MCH: 30 pg (ref 26.0–34.0)
MCHC: 32 g/dL (ref 30.0–36.0)
MCV: 93.6 fL (ref 78.0–100.0)
Platelets: 148 10*3/uL — ABNORMAL LOW (ref 150–400)
RBC: 3.27 MIL/uL — AB (ref 3.87–5.11)
RDW: 15.8 % — ABNORMAL HIGH (ref 11.5–15.5)
WBC: 7.9 10*3/uL (ref 4.0–10.5)

## 2016-09-13 NOTE — ED Provider Notes (Signed)
[facility]-EMERGENCY DEPT Provider Note   CSN: [id number] Arrival date & time: 09/13/16  4098     History   Chief Complaint Chief Complaint  Patient presents with  . Extremity Pain  . Arm Swelling    HPI [name] is a 83 y.o. female.  Patient with h/o ESRD, with Gore-tex brachial artery to brachial vein graft placed by Dr. Hart Rochester on 10/09/15 in the R upper extremity-- presents with c/o swelling and pain to the R upper arm and anterior shoulder after 2 hrs of dialysis today. Sent over concern for infiltration. Pt c/o mild pain and swelling. No fever, N/V, lightheadedness, syncope. No numbness in hand or fingers. The onset of this condition was acute. The course is constant. Aggravating factors: none. Alleviating factors: none.        Past Medical History:  Diagnosis Date  . Anemia   . Arthritis   . Asthma   . CKD (chronic kidney disease) stage V requiring chronic dialysis (HCC)    T/TH/Sa  . Dementia   . DVT (deep venous thrombosis) (HCC)    LLE DVT 06/06/14  . Gout   . Hyperlipemia   . Hypertension     Patient Active Problem List   Diagnosis Date Noted  . Colitis 08/10/2014  . HCAP (healthcare-associated pneumonia) 07/11/2014  . Oral thrush 07/08/2014  . Productive cough 07/05/2014  . Sepsis (HCC) 07/05/2014  . Sinus tachycardia 07/05/2014  . Pleural effusion on left 07/05/2014  . Anemia of renal disease 07/05/2014  . Leukocytosis 07/05/2014  . Hypertension   . Palliative care encounter 06/16/2014  . Decreased appetite 06/16/2014  . Protein-calorie malnutrition, severe (HCC) 06/14/2014  . DVT (deep venous thrombosis) (HCC) 06/09/2014  . Anxiety state 06/09/2014  . Dementia with behavioral disturbance   . Arterial hypotension   . Hypotension 06/05/2014  . SOB (shortness of breath) 06/05/2014  . UTI (lower urinary tract infection) 06/05/2014  . Diarrhea 06/05/2014  . Mechanical complication of other vascular device, implant, and graft 05/07/2013  . ESRD  on dialysis (HCC) 05/07/2013  . Loss of weight 03/13/2013  . Hyperlipidemia 05/01/2009  . ALLERGIC RHINITIS 11/21/2007  . ACUTE LARYNGITIS, WITHOUT MENTION OF OBSTRUCTIO 07/18/2007  . GERD 05/21/2007  . GOUT 11/18/2006  . ANEMIA-NOS 11/18/2006  . HYPERTENSION 11/18/2006  . Asthma 11/18/2006  . DIVERTICULOSIS, COLON 11/18/2006  . RENAL FAILURE 11/18/2006  . CARDIOMEGALY, MILD 02/08/1994    Past Surgical History:  Procedure Laterality Date  . AV FISTULA PLACEMENT    . AV FISTULA PLACEMENT Right 10/09/2015   Procedure: INSERTION OF ARTERIOVENOUS (AV) GORE-TEX GRAFT RIGHT UPPER ARM;  Surgeon: Pryor Ochoa, MD;  Location: [facility] OR;  Service: Vascular;  Laterality: Right;  . INSERTION OF DIALYSIS CATHETER Right 08/30/2015   Procedure: INSERTION OF DIALYSIS CATHETER RIGHT INTERNAL JUGULAR ;  Surgeon: Fransisco Hertz, MD;  Location: [facility] OR;  Service: Vascular;  Laterality: Right;  . LIGATION OF ARTERIOVENOUS  FISTULA Left 08/30/2015   Procedure: LIGATION OF ARTERIOVENOUS  FISTULA  LEFT ARM;  Surgeon: Fransisco Hertz, MD;  Location: [facility] OR;  Service: Vascular;  Laterality: Left;  . REVISON OF ARTERIOVENOUS FISTULA Left 08/28/2015   Procedure: REVISON OF ARTERIOVENOUS FISTULA;  Surgeon: Pryor Ochoa, MD;  Location: [facility] OR;  Service: Vascular;  Laterality: Left;  . TUBAL LIGATION      OB History    No data available       Home Medications    Prior to Admission medications  Medication Sig Start Date End Date Taking? Authorizing Provider  albuterol (PROAIR HFA) 108 (90 BASE) MCG/ACT inhaler Inhale 2 puffs into the lungs every 6 (six) hours as needed for wheezing or shortness of breath.     Historical Provider, MD  albuterol (PROVENTIL) (2.5 MG/3ML) 0.083% nebulizer solution Take 3 mLs by nebulization daily as needed for wheezing or shortness of breath.  07/07/14   Historical Provider, MD  ASPIRIN LOW DOSE 81 MG EC tablet Take 81 mg by mouth daily.  08/02/15   Historical Provider, MD  FLOVENT DISKUS  100 MCG/BLIST AEPB Inhale 2 puffs into the lungs daily as needed (for wheezing or shortness of breath).  05/19/14   Historical Provider, MD  HYDROcodone-acetaminophen (NORCO/VICODIN) 5-325 MG tablet Take 1 tablet by mouth every 6 (six) hours as needed for moderate pain or severe pain. 10/09/15   Raymond GurneyKimberly A Trinh, PA-C  midodrine (PROAMATINE) 10 MG tablet Take 1 tablet (10 mg total) by mouth 3 (three) times daily with meals. Patient taking differently: Take 10 mg by mouth Every Tuesday,Thursday,and Saturday with dialysis. Ulanda Edisonues, Thurs, and HKVSat 07/09/14   Marinda ElkAbraham Feliz Ortiz, MD  multivitamin (RENA-VIT) TABS tablet Take 1 tablet by mouth at bedtime.     Historical Provider, MD  nitroGLYCERIN (NITROSTAT) 0.4 MG SL tablet Place 0.4 mg under the tongue every 5 (five) minutes as needed for chest pain.    Historical Provider, MD  Nutritional Supplements (FEEDING SUPPLEMENT, NEPRO CARB STEADY,) LIQD Take 237 mLs by mouth 2 (two) times daily between meals. Patient taking differently: Take 237 mLs by mouth daily as needed (feeding supplement).  06/09/14   Tora KindredMarianne L York, PA-C  RENVELA 800 MG tablet Take 800-3,200 mg by mouth 4 (four) times daily -  with meals and at bedtime. 3200 mg by mouth for breakfast and dinner, 1600 mg by mouth for lunch, 800 mg by mouth for snacks 06/21/15   Historical Provider, MD  SENSIPAR 60 MG tablet Take 60 mg by mouth daily.  07/02/15   Historical Provider, MD    Family History Family History  Problem Relation Age of Onset  . Colon cancer Neg Hx   . Diabetes Mellitus II Neg Hx     Social History Social History  Substance Use Topics  . Smoking status: Never Smoker  . Smokeless tobacco: Former NeurosurgeonUser    Types: Snuff  . Alcohol use No     Allergies   Nsaids and Latex   Review of Systems Review of Systems  Unable to perform ROS: Dementia     Physical Exam Updated Vital Signs BP (!) 84/53 (BP Location: Left Arm)   Pulse 84   Temp 97.6 F (36.4 C)   Resp 16    Ht 5\' 5"  (1.651 m)   Wt 74.4 kg   SpO2 100%   BMI 27.29 kg/m   Physical Exam  Constitutional: She appears well-developed and well-nourished.  HENT:  Head: Normocephalic and atraumatic.  Eyes: Conjunctivae are normal.  Neck: Normal range of motion. Neck supple.  Cardiovascular: Regular rhythm.   Pulses:      Radial pulses are 1+ on the left side.  Pulmonary/Chest: No respiratory distress.  Musculoskeletal:  There is tenderness from the R proximal forearm to the R anterior shoulder. There is graft in place R upper arm with thrill. There is slight edema of the R upper arm and R anterior shoulder and lateral chest wall.  Neurological: She is alert.  Skin: Skin is warm and dry.  Sensation  intact in fingers of right hand.   Psychiatric: She has a normal mood and affect.  Nursing note and vitals reviewed.    ED Treatments / Results  Labs (all labs ordered are listed, but only abnormal results are displayed) Labs Reviewed  CBC - Abnormal; Notable for the following:       Result Value   RBC 3.27 (*)    Hemoglobin 9.8 (*)    HCT 30.6 (*)    RDW 15.8 (*)    Platelets 148 (*)    All other components within normal limits  BASIC METABOLIC PANEL - Abnormal; Notable for the following:    Potassium 3.4 (*)    Chloride 99 (*)    Glucose, Bld 112 (*)    Creatinine, Ser 5.70 (*)    Calcium 8.2 (*)    GFR calc non Af Amer 6 (*)    GFR calc Af Amer 7 (*)    All other components within normal limits    Radiology Dg Chest 2 View  Result Date: 09/13/2016 CLINICAL DATA:  Right arm swelling and pain during dialysis. Right upper arm and chest pain and swelling. EXAM: CHEST  2 VIEW COMPARISON:  08/30/2015 FINDINGS: Previously seen right tunneled dialysis catheter has been removed. Cardiomegaly. Lungs are clear. No effusions or edema. No acute bony abnormality. IMPRESSION: Cardiomegaly.  No active disease. Electronically Signed   By: Charlett Nose M.D.   On: 09/13/2016 11:30     Procedures Procedures (including critical care time)  Medications Ordered in ED Medications - No data to display   Initial Impression / Assessment and Plan / ED Course  I have reviewed the triage vital signs and the nursing notes.  Pertinent labs & imaging results that were available during my care of the patient were reviewed by me and considered in my medical decision making (see chart for details).     Patient seen and examined.   Vital signs reviewed and are as follows: Ht 5\' 5"  (1.651 m)   Wt 74.4 kg   SpO2 98%   BMI 27.29 kg/m   Spoke with nephrology NP who will see in ED.   10:40 AM Nephrology requests vascular surgery consult and CXR. I have also ordered labs.   11:02 AM I have paged on-call VVS provider. Marisue Humble PA is in surgery. I was given number for office to find someone who is available. I spoke with operator who states they would page Dr. Myra Gianotti to me.   11:06 AM VVS at bedside.   Dr. Myra Gianotti recommended IR consult. Spoke with Dr. Lowella Dandy.   Patient seen by Brayton El PA-C of IR. Appointment made tomorrow for outpatient procedure to attempt de-clot. Instructions given by IR.   No indication for emergent dialysis.  BP (!) 84/53 (BP Location: Left Arm)   Pulse 84   Temp 97.6 F (36.4 C)   Resp 16   Ht 5\' 5"  (1.651 m)   Wt 74.4 kg   SpO2 100%   BMI 27.29 kg/m     Final Clinical Impressions(s) / ED Diagnoses   Final diagnoses:  Hemodialysis catheter dysfunction, initial encounter [facility])   Patient with Possible clot of hemodialysis AV graft. Nephrology, vascular surgery, IR consult to. Patient to have procedure tomorrow. Instructions given by IR. Potassium is 3.4. Patient is hypotensive per her baseline. Do not think she is septic. She is not lightheaded or dizzy.    New Prescriptions Discharge Medication List as of 09/13/2016  2:14 PM  Renne Crigler, PA-C 09/13/16 1537    Gerhard Munch, MD 09/15/16 2105

## 2016-09-13 NOTE — Progress Notes (Signed)
Chief Complaint: Patient was seen in consultation today for  Chief Complaint  Patient presents with  . Extremity Pain  . Arm Swelling   at the request of Alonna Bucklerita Brown NP  Referring Physician(s): Alonna Bucklerita Brown NP Dr. Myra GianottiBrabham  Supervising Physician: Richarda OverlieHenn, Adam  [facility]  History of Present Illness: [name] is a 83 y.o. female with ESRD. She normally has HD via (R)UE AVG on T-TH-Sa. She has had prior stenosis treated at the outpt center. She was at treatment today and after about 2 hrs of her session, she developed (R)UE swelling and pain. She was sent to the ER cannulated for further eval. She has since been decannulated. VVS saw pt and feels AVG is now clotted and recommended IR to eval for possible declot. She was also seen by Nephrology, who feels pt is stable. Chart, PMHx, meds, labs reviewed. Pt family at bedside. Pt reports feeling better with less pain than upon arrival.  Past Medical History:  Diagnosis Date  . Anemia   . Arthritis   . Asthma   . CKD (chronic kidney disease) stage V requiring chronic dialysis (HCC)    T/TH/Sa  . Dementia   . DVT (deep venous thrombosis) (HCC)    LLE DVT 06/06/14  . Gout   . Hyperlipemia   . Hypertension     Past Surgical History:  Procedure Laterality Date  . AV FISTULA PLACEMENT    . AV FISTULA PLACEMENT Right 10/09/2015   Procedure: INSERTION OF ARTERIOVENOUS (AV) GORE-TEX GRAFT RIGHT UPPER ARM;  Surgeon: Pryor OchoaJames D Lawson, MD;  [facility];  Service: Vascular;  Laterality: Right;  . INSERTION OF DIALYSIS CATHETER Right 08/30/2015   Procedure: INSERTION OF DIALYSIS CATHETER RIGHT INTERNAL JUGULAR ;  Surgeon: Fransisco HertzBrian L Chen, MD;  [facility];  Service: Vascular;  Laterality: Right;  . LIGATION OF ARTERIOVENOUS  FISTULA Left 08/30/2015   Procedure: LIGATION OF ARTERIOVENOUS  FISTULA  LEFT ARM;  Surgeon: Fransisco HertzBrian L Chen, MD;  [facility];  Service: Vascular;  Laterality: Left;  . REVISON OF  ARTERIOVENOUS FISTULA Left 08/28/2015   Procedure: REVISON OF ARTERIOVENOUS FISTULA;  Surgeon: Pryor OchoaJames D Lawson, MD;  [facility];  Service: Vascular;  Laterality: Left;  . TUBAL LIGATION      Allergies: Nsaids and Latex  Medications: Prior to Admission medications   Medication Sig Start Date End Date Taking? Authorizing Provider  albuterol (PROAIR HFA) 108 (90 BASE) MCG/ACT inhaler Inhale 2 puffs into the lungs every 6 (six) hours as needed for wheezing or shortness of breath.    Yes Historical Provider, MD  albuterol (PROVENTIL) (2.5 MG/3ML) 0.083% nebulizer solution Take 3 mLs by nebulization daily as needed for wheezing or shortness of breath.  07/07/14  Yes Historical Provider, MD  ASPIRIN LOW DOSE 81 MG EC tablet Take 81 mg by mouth daily.  08/02/15  Yes Historical Provider, MD  FLOVENT DISKUS 100 MCG/BLIST AEPB Inhale 2 puffs into the lungs daily as needed (for wheezing or shortness of breath).  05/19/14  Yes Historical Provider, MD  HYDROcodone-acetaminophen (NORCO/VICODIN) 5-325 MG tablet Take 1 tablet by mouth every 6 (six) hours as needed for moderate pain or severe pain. 10/09/15  Yes Kimberly A Trinh, PA-C  midodrine (PROAMATINE) 10 MG tablet Take 1 tablet (10 mg total) by mouth 3 (three) times daily with meals. Patient taking differently: Take 10 mg by mouth Every Tuesday,Thursday,and Saturday with dialysis. Ulanda Edisonues, Thurs, and Sat 07/09/14  Yes  [name], MD  multivitamin (RENA-VIT) TABS tablet Take 1 tablet by mouth at bedtime.    Yes Historical Provider, MD  nitroGLYCERIN (NITROSTAT) 0.4 MG SL tablet Place 0.4 mg under the tongue every 5 (five) minutes as needed for chest pain.   Yes Historical Provider, MD  Nutritional Supplements (FEEDING SUPPLEMENT, NEPRO CARB STEADY,) LIQD Take 237 mLs by mouth 2 (two) times daily between meals. Patient taking differently: Take 237 mLs by mouth daily as needed (feeding supplement).  06/09/14  Yes Marianne L York, PA-C  RENVELA 800 MG  tablet Take 800-1,600 mg by mouth 4 (four) times daily -  with meals and at bedtime. 800mg  three times daily, 1600mg  with snacks 06/21/15  Yes Historical Provider, MD  SENSIPAR 60 MG tablet Take 60 mg by mouth daily.  07/02/15  Yes Historical Provider, MD     Family History  Problem Relation Age of Onset  . Colon cancer Neg Hx   . Diabetes Mellitus II Neg Hx     Social History   Social History  . Marital status: Divorced    Spouse name: N/A  . Number of children: N/A  . Years of education: N/A   Social History Main Topics  . Smoking status: Never Smoker  . Smokeless tobacco: Former Neurosurgeon    Types: Snuff  . Alcohol use No  . Drug use: No  . Sexual activity: Not Asked   Other Topics Concern  . None   Social History Narrative  . None     Review of Systems: A 12 point ROS discussed and pertinent positives are indicated in the HPI above.  All other systems are negative.  Review of Systems  Vital Signs: BP (!) 84/53 (BP Location: Left Arm)   Pulse 84   Temp 97.6 F (36.4 C)   Resp 16   Ht 5\' 5"  (1.651 m)   Wt 164 lb (74.4 kg)   SpO2 100%   BMI 27.29 kg/m   Physical Exam  Constitutional: She appears well-developed and well-nourished. No distress.  HENT:  Head: Normocephalic.  Mouth/Throat: Oropharynx is clear and moist.  Neck: Normal range of motion. No tracheal deviation present.  Cardiovascular: Normal rate, regular rhythm and normal heart sounds.   Pulmonary/Chest: Effort normal and breath sounds normal. No respiratory distress.  Musculoskeletal:  (R)UE with focal swelling and firmness in the upper arm c/w hematoma. NO pulse/thrill/bruit found over AVG Minimal edema into forearm/hand. Hand warm, good radial pulse  Neurological: She is alert.  Skin: Skin is warm and dry.    Mallampati Score:  MD Evaluation Airway: WNL Heart: WNL Abdomen: WNL Chest/ Lungs: WNL ASA  Classification: 3 Mallampati/Airway Score: Two  Imaging: Dg Chest 2 View  Result  Date: 09/13/2016 CLINICAL DATA:  Right arm swelling and pain during dialysis. Right upper arm and chest pain and swelling. EXAM: CHEST  2 VIEW COMPARISON:  08/30/2015 FINDINGS: Previously seen right tunneled dialysis catheter has been removed. Cardiomegaly. Lungs are clear. No effusions or edema. No acute bony abnormality. IMPRESSION: Cardiomegaly.  No active disease. Electronically Signed   By: Charlett Nose M.D.   On: 09/13/2016 11:30    Labs:  CBC:  Recent Labs  10/09/15 0744 09/13/16 1059  WBC  --  7.9  HGB 10.5* 9.8*  HCT 31.0* 30.6*  PLT  --  148*    COAGS: No results for input(s): INR, APTT in the last 8760 hours.  BMP:  Recent Labs  10/09/15 0744 09/13/16 1059  NA  139 139  K 3.2* 3.4*  CL  --  99*  CO2  --  28  GLUCOSE 78 112*  BUN  --  20  CALCIUM  --  8.2*  CREATININE  --  5.70*  GFRNONAA  --  6*  GFRAA  --  7*    Assessment and Plan: ESRD Thrombosed (R)UE AVG with hematoma Pt medically stable with minimal discomfort K is 3.4 as pt had partial HD session today. Pt may be discharged as declot procedure cannot be accommodated today. Have given pt and family instructions to return tomorrow for outpt attempt at declot vs HD catheter placement. D/w ER attending, who will release pt.  Thank you for this interesting consult.  I greatly enjoyed meeting Raveen M Perin and look forward to participating in their care.  A copy of this report was sent to the requesting provider on this date.  Electronically Signed: Brayton El 09/13/2016, 2:27 PM   I spent a total of 25 minutes in face to face in clinical consultation, greater than 50% of which was counseling/coordinating care for clotted (R)UE AVG

## 2016-09-13 NOTE — Consult Note (Signed)
Hospital Consult    Reason for Consult:  Swelling right arm  Referring Physician:  ED MRN #:  [id number]  History of Present Illness: This is a 83 y.o. female who previously underwent a RUA AVG by Dr. Hart Rochester on 10/09/15.  She was at HD this am and after about 2 hours, she began to c/o sudden pain in her right arm.  She states it came on suddenly.  Her dialysis was stopped.  she states she has not had any issues with this graft in the past. There are concerns about infiltration and she is sent to the ER.    She takes a daily aspirin.  She takes Midodrine.  She has inhalers for her asthma.   Past Medical History:  Diagnosis Date  . Anemia   . Arthritis   . Asthma   . CKD (chronic kidney disease) stage V requiring chronic dialysis (HCC)    T/TH/Sa  . Dementia   . DVT (deep venous thrombosis) (HCC)    LLE DVT 06/06/14  . Gout   . Hyperlipemia   . Hypertension     Past Surgical History:  Procedure Laterality Date  . AV FISTULA PLACEMENT    . AV FISTULA PLACEMENT Right 10/09/2015   Procedure: INSERTION OF ARTERIOVENOUS (AV) GORE-TEX GRAFT RIGHT UPPER ARM;  Surgeon: Pryor Ochoa, MD;  Location: [facility] OR;  Service: Vascular;  Laterality: Right;  . INSERTION OF DIALYSIS CATHETER Right 08/30/2015   Procedure: INSERTION OF DIALYSIS CATHETER RIGHT INTERNAL JUGULAR ;  Surgeon: Fransisco Hertz, MD;  Location: [facility] OR;  Service: Vascular;  Laterality: Right;  . LIGATION OF ARTERIOVENOUS  FISTULA Left 08/30/2015   Procedure: LIGATION OF ARTERIOVENOUS  FISTULA  LEFT ARM;  Surgeon: Fransisco Hertz, MD;  Location: [facility] OR;  Service: Vascular;  Laterality: Left;  . REVISON OF ARTERIOVENOUS FISTULA Left 08/28/2015   Procedure: REVISON OF ARTERIOVENOUS FISTULA;  Surgeon: Pryor Ochoa, MD;  Location: [facility] OR;  Service: Vascular;  Laterality: Left;  . TUBAL LIGATION      Allergies  Allergen Reactions  . Nsaids Other (See Comments)    REACTION: Avoid NSAIDS(renal failure)  . Latex Hives and Swelling    Prior  to Admission medications   Medication Sig Start Date End Date Taking? Authorizing Provider  albuterol (PROAIR HFA) 108 (90 BASE) MCG/ACT inhaler Inhale 2 puffs into the lungs every 6 (six) hours as needed for wheezing or shortness of breath.     Historical Provider, MD  albuterol (PROVENTIL) (2.5 MG/3ML) 0.083% nebulizer solution Take 3 mLs by nebulization daily as needed for wheezing or shortness of breath.  07/07/14   Historical Provider, MD  ASPIRIN LOW DOSE 81 MG EC tablet Take 81 mg by mouth daily.  08/02/15   Historical Provider, MD  FLOVENT DISKUS 100 MCG/BLIST AEPB Inhale 2 puffs into the lungs daily as needed (for wheezing or shortness of breath).  05/19/14   Historical Provider, MD  HYDROcodone-acetaminophen (NORCO/VICODIN) 5-325 MG tablet Take 1 tablet by mouth every 6 (six) hours as needed for moderate pain or severe pain. 10/09/15   Raymond Gurney, PA-C  midodrine (PROAMATINE) 10 MG tablet Take 1 tablet (10 mg total) by mouth 3 (three) times daily with meals. Patient taking differently: Take 10 mg by mouth Every Tuesday,Thursday,and Saturday with dialysis. [name], and [facility] 07/09/14   [name], MD  multivitamin (RENA-VIT) TABS tablet Take 1 tablet by mouth at bedtime.     Historical Provider, MD  nitroGLYCERIN (  NITROSTAT) 0.4 MG SL tablet Place 0.4 mg under the tongue every 5 (five) minutes as needed for chest pain.    Historical Provider, MD  Nutritional Supplements (FEEDING SUPPLEMENT, NEPRO CARB STEADY,) LIQD Take 237 mLs by mouth 2 (two) times daily between meals. Patient taking differently: Take 237 mLs by mouth daily as needed (feeding supplement).  06/09/14   Tora KindredMarianne L York, PA-C  RENVELA 800 MG tablet Take 800-3,200 mg by mouth 4 (four) times daily -  with meals and at bedtime. 3200 mg by mouth for breakfast and dinner, 1600 mg by mouth for lunch, 800 mg by mouth for snacks 06/21/15   Historical Provider, MD  SENSIPAR 60 MG tablet Take 60 mg by mouth daily.  07/02/15    Historical Provider, MD    Social History   Social History  . Marital status: Divorced    Spouse name: N/A  . Number of children: N/A  . Years of education: N/A   Occupational History  . Not on file.   Social History Main Topics  . Smoking status: Never Smoker  . Smokeless tobacco: Former NeurosurgeonUser    Types: Snuff  . Alcohol use No  . Drug use: No  . Sexual activity: Not on file   Other Topics Concern  . Not on file   Social History Narrative  . No narrative on file     Family History  Problem Relation Age of Onset  . Colon cancer Neg Hx   . Diabetes Mellitus II Neg Hx     ROS: [x]  Positive   [ ]  Negative   [ ]  All sytems reviewed and are negative  Cardiovascular: []  chest pain/pressure []  palpitations []  SOB lying flat []  DOE []  pain in legs while walking []  pain in legs at rest []  pain in legs at night []  non-healing ulcers [x]  hx of DVT-LLE 11/15 [x]  swelling right arm  Pulmonary: []  productive cough [x]  asthma/wheezing []  home O2  Neurologic: []  weakness in []  arms []  legs []  numbness in []  arms []  legs []  hx of CVA []  mini stroke [] difficulty speaking or slurred speech []  temporary loss of vision in one eye []  dizziness  Hematologic: []  hx of cancer []  bleeding problems []  problems with blood clotting easily [x]  anemia  Endocrine:   []  diabetes []  thyroid disease  GI []  vomiting blood []  blood in stool  GU: [x]  CKD/renal failure [x]  HD--[]  M/W/F or [x]  T/T/S []  burning with urination []  blood in urine  Psychiatric: []  anxiety []  depression  Musculoskeletal: [x]  arthritis []  joint pain [x]  hx gout  Integumentary: []  rashes []  ulcers  Constitutional: []  fever []  chills   Physical Examination  Vitals:   09/13/16 1058 09/13/16 1113  BP: 91/60 (!) 88/56  Pulse:  73  Resp:  18  Temp:      General:  WDWN in NAD Gait: Not observed HENT: WNL, normocephalic Pulmonary: normal non-labored breathing, without Rales,  rhonchi,  wheezing Cardiac: regular, without  Murmurs, rubs or gallops; without carotid bruits Abdomen:  soft, NT/ND, no masses Skin: without rashes Vascular Exam/Pulses:  Right Left  Radial 1+ (weak) Bandage from blood draw   Extremities: unable to palpate thrill or hear bruit in RUA AVG Musculoskeletal: no muscle wasting or atrophy  Neurologic: A&O X 3;  No focal weakness or paresthesias are detected; speech is fluent/normal Psychiatric:  The pt has Normal affect.   CBC    Component Value Date/Time   WBC 5.7 08/29/2015 2235  RBC 3.82 (L) 08/29/2015 2235   HGB 10.5 (L) 10/09/2015 0744   HCT 31.0 (L) 10/09/2015 0744   PLT 173 08/29/2015 2235   MCV 98.2 08/29/2015 2235   MCH 31.9 08/29/2015 2235   MCHC 32.5 08/29/2015 2235   RDW 15.6 (H) 08/29/2015 2235   LYMPHSABS 2.3 08/29/2015 2235   MONOABS 0.4 08/29/2015 2235   EOSABS 0.1 08/29/2015 2235   BASOSABS 0.1 08/29/2015 2235    BMET    Component Value Date/Time   NA 139 10/09/2015 0744   K 3.2 (L) 10/09/2015 0744   CL 98 (L) 08/29/2015 2235   CO2 26 08/29/2015 2235   GLUCOSE 78 10/09/2015 0744   BUN 52 (H) 08/29/2015 2235   CREATININE 9.37 (H) 08/29/2015 2235   CALCIUM 8.3 (L) 08/29/2015 2235   CALCIUM 9.6 04/10/2014 0917   GFRNONAA 3 (L) 08/29/2015 2235   GFRAA 4 (L) 08/29/2015 2235    COAGS: Lab Results  Component Value Date   INR 1.30 11/04/2014   INR 2.41 (H) 08/14/2014   INR 2.19 (H) 08/13/2014     Non-Invasive Vascular Imaging:   None   Statin:  No. Beta Blocker:  No. Aspirin:  Yes.   ACEI:  No. ARB:  No. CCB use:  No Other antiplatelets/anticoagulants:  No.    ASSESSMENT/PLAN: This is a 83 y.o. female with ESRD on T/T/S who presents to the ER with RUA swelling and pain   -the right arm is swollen.  I do not hear a bruit or feel a thrill.  She may need consult to IR for declot and/or TDC.  Dr. Myra Gianotti down to see pt and she is in radiology. He will be back by to see her. -pt is npo in case  she needs a procedure.  (she says she has not eaten or drank anything today).   Doreatha Massed, PA-C [facility] [phone]   I agree with the above.  I have seen and evaluated the patient.  She had a right upper arm dialysis graft placed by Dr. Hart Rochester last year.  She is undergone intervention on the venous outflow tract by Dr. Juel Burrow.  She was halfway through her dialysis session today when she began complaining of pain.  Her session was terminated.  The needles were left in and she was sent to the ER.  She has subsequently been be cannulated.  She is complaining of pain in her arm.  On examination I cannot palpate a thrill within the graft.  The area underlying the graft is very tender.  I suspect that the graft has now occluded.  I would recommend interventional radiology consultation for thrombolysis of her dialysis graft.  Durene Cal

## 2016-09-13 NOTE — Consult Note (Addendum)
[facility] Renal Consultation Note    Indication for Consultation:  Management of ESRD/hemodialysis; anemia, hypertension/volume and secondary hyperparathyroidism PCP: Dorrene German, MD  HPI: [name] is a 83 y.o. female with ESRD 2/2 HTN on hemodialysis since 2015. She has hemodialysis  T,TH,S at [facility]. PMH significant for HTN, dementia. DVT on coumadin, gout HLD, colitis, anemia of chronic disease, SHPT.   Patient was sent to ED from dialysis center after 2 hours of treatment for C/O swelling and tenderness RUA AVG extending into R. Upper axilla area and R chest. AVG was placed 10/09/2015 per Dr. Hart Rochester. Last intervention was 05/11/2016 per Dr. Juel Burrow who performed shuntogram which showed 60-70% venous anastomosis and intragraft stenosis which was treated to 10% with vaccess angioplasty. Study also revealed central stenosis with good by pass veins.  Patient is being seen in ED, son at bedside. Patient states "they put a needle in wrong last week" and it's hurt ever since. Cannot find documentation in [place] to support this. Says swelling and pain is worse today. Currently AVG is cannulated, RUA exquisitely tender above needles and into R Axilla area. Patient has no other C/Os. Denies fever, chills, N,V,D, chest pain/SOB, Cough, DOE,  abdominal pain, flank pain, headaches, dizziness falls.    Patient attend HDs at [name] regularly, does not miss treatments. She usually gets within acceptable range of EDW, HGB and BMD labs have been stable.   Past Medical History:  Diagnosis Date  . Anemia   . Arthritis   . Asthma   . CKD (chronic kidney disease) stage V requiring chronic dialysis (HCC)    T/TH/Sa  . Dementia   . DVT (deep venous thrombosis) (HCC)    LLE DVT 06/06/14  . Gout   . Hyperlipemia   . Hypertension    Past Surgical History:  Procedure Laterality Date  . AV FISTULA PLACEMENT    . AV FISTULA PLACEMENT Right 10/09/2015   Procedure: INSERTION  OF ARTERIOVENOUS (AV) GORE-TEX GRAFT RIGHT UPPER ARM;  Surgeon: Pryor Ochoa, MD;  Location: [facility] OR;  Service: Vascular;  Laterality: Right;  . INSERTION OF DIALYSIS CATHETER Right 08/30/2015   Procedure: INSERTION OF DIALYSIS CATHETER RIGHT INTERNAL JUGULAR ;  Surgeon: Fransisco Hertz, MD;  Location: [facility] OR;  Service: Vascular;  Laterality: Right;  . LIGATION OF ARTERIOVENOUS  FISTULA Left 08/30/2015   Procedure: LIGATION OF ARTERIOVENOUS  FISTULA  LEFT ARM;  Surgeon: Fransisco Hertz, MD;  Location: [facility] OR;  Service: Vascular;  Laterality: Left;  . REVISON OF ARTERIOVENOUS FISTULA Left 08/28/2015   Procedure: REVISON OF ARTERIOVENOUS FISTULA;  Surgeon: Pryor Ochoa, MD;  Location: [facility] OR;  Service: Vascular;  Laterality: Left;  . TUBAL LIGATION     Family History  Problem Relation Age of Onset  . Colon cancer Neg Hx   . Diabetes Mellitus II Neg Hx    Social History:  reports that she has never smoked. She has quit using smokeless tobacco. Her smokeless tobacco use included Snuff. She reports that she does not drink alcohol or use drugs. Allergies  Allergen Reactions  . Nsaids Other (See Comments)    REACTION: Avoid NSAIDS(renal failure)  . Latex Hives and Swelling   Prior to Admission medications   Medication Sig Start Date End Date Taking? Authorizing Provider  albuterol (PROAIR HFA) 108 (90 BASE) MCG/ACT inhaler Inhale 2 puffs into the lungs every 6 (six) hours as needed for wheezing or shortness of breath.  Historical Provider, MD  albuterol (PROVENTIL) (2.5 MG/3ML) 0.083% nebulizer solution Take 3 mLs by nebulization daily as needed for wheezing or shortness of breath.  07/07/14   Historical Provider, MD  ASPIRIN LOW DOSE 81 MG EC tablet Take 81 mg by mouth daily.  08/02/15   Historical Provider, MD  FLOVENT DISKUS 100 MCG/BLIST AEPB Inhale 2 puffs into the lungs daily as needed (for wheezing or shortness of breath).  05/19/14   Historical Provider, MD  HYDROcodone-acetaminophen  (NORCO/VICODIN) 5-325 MG tablet Take 1 tablet by mouth every 6 (six) hours as needed for moderate pain or severe pain. 10/09/15   Raymond Gurney, PA-C  midodrine (PROAMATINE) 10 MG tablet Take 1 tablet (10 mg total) by mouth 3 (three) times daily with meals. Patient taking differently: Take 10 mg by mouth Every Tuesday,Thursday,and Saturday with dialysis. [name], and [facility] 07/09/14   [name], MD  multivitamin (RENA-VIT) TABS tablet Take 1 tablet by mouth at bedtime.     Historical Provider, MD  nitroGLYCERIN (NITROSTAT) 0.4 MG SL tablet Place 0.4 mg under the tongue every 5 (five) minutes as needed for chest pain.    Historical Provider, MD  Nutritional Supplements (FEEDING SUPPLEMENT, NEPRO CARB STEADY,) LIQD Take 237 mLs by mouth 2 (two) times daily between meals. Patient taking differently: Take 237 mLs by mouth daily as needed (feeding supplement).  06/09/14   [name], PA-C  RENVELA 800 MG tablet Take 800-3,200 mg by mouth 4 (four) times daily -  with meals and at bedtime. 3200 mg by mouth for breakfast and dinner, 1600 mg by mouth for lunch, 800 mg by mouth for snacks 06/21/15   Historical Provider, MD  SENSIPAR 60 MG tablet Take 60 mg by mouth daily.  07/02/15   Historical Provider, MD   No current facility-administered medications for this encounter.    Current Outpatient Prescriptions  Medication Sig Dispense Refill  . albuterol (PROAIR HFA) 108 (90 BASE) MCG/ACT inhaler Inhale 2 puffs into the lungs every 6 (six) hours as needed for wheezing or shortness of breath.     Marland Kitchen albuterol (PROVENTIL) (2.5 MG/3ML) 0.083% nebulizer solution Take 3 mLs by nebulization daily as needed for wheezing or shortness of breath.   11  . ASPIRIN LOW DOSE 81 MG EC tablet Take 81 mg by mouth daily.   11  . FLOVENT DISKUS 100 MCG/BLIST AEPB Inhale 2 puffs into the lungs daily as needed (for wheezing or shortness of breath).   3  . HYDROcodone-acetaminophen (NORCO/VICODIN) 5-325 MG tablet  Take 1 tablet by mouth every 6 (six) hours as needed for moderate pain or severe pain. 30 tablet 0  . midodrine (PROAMATINE) 10 MG tablet Take 1 tablet (10 mg total) by mouth 3 (three) times daily with meals. (Patient taking differently: Take 10 mg by mouth Every Tuesday,Thursday,and Saturday with dialysis. Tues, Thurs, and Sat) 90 tablet 3  . multivitamin (RENA-VIT) TABS tablet Take 1 tablet by mouth at bedtime.     . nitroGLYCERIN (NITROSTAT) 0.4 MG SL tablet Place 0.4 mg under the tongue every 5 (five) minutes as needed for chest pain.    . Nutritional Supplements (FEEDING SUPPLEMENT, NEPRO CARB STEADY,) LIQD Take 237 mLs by mouth 2 (two) times daily between meals. (Patient taking differently: Take 237 mLs by mouth daily as needed (feeding supplement). ) 60 Can 3  . RENVELA 800 MG tablet Take 800-3,200 mg by mouth 4 (four) times daily -  with meals and at bedtime. 3200  mg by mouth for breakfast and dinner, 1600 mg by mouth for lunch, 800 mg by mouth for snacks  5  . SENSIPAR 60 MG tablet Take 60 mg by mouth daily.   6   No results found.  ROS: As per HPI otherwise negative.   Physical Exam: Vitals:   09/13/16 0949 09/13/16 0958  SpO2: 98%   Weight:  74.4 kg (164 lb)  Height:  5\' 5"  (1.651 m)     General: Very pleasant elderly thin female in no acute distress. Head: Normocephalic, atraumatic, sclera non-icteric, mucus membranes are moist Neck: Supple. JVD not elevated. Lungs: Clear bilaterally to auscultation without wheezes, rales, or rhonchi. Breathing is unlabored. Heart: RRR with S1 S2. 2/6 systolic M, No JVD. No R/G.  Abdomen: Soft, non-tender, non-distended with normoactive bowel sounds. No rebound/guarding. No obvious abdominal masses. M-S:  Strength and tone appear normal for age. Lower extremities: Trace pre-tib edema. No ischemic changes, no open wounds  Neuro: Alert and oriented X 3 Moves all extremities spontaneously. Psych:  Responds to questions appropriately with a  normal affect. Dialysis Access: RUA AVG cannulated at present. Appears swollen, tender to touch.   Dialysis Orders: [facility] T,Th,S 4 hours 400/800 75.5 kg  2.0 K/2.0 Ca  -Heparin 5700 Units IV TIW -Venofer 50 mg IV weekly (last HGB 11.8 09/08/2016  Fe 62 Tsat 36% (08/25/2016) Ferritin 818 (07/21/2016) -Calcitriol 1.5 mcg PO TIW (Last PTH 232 Ca 9.1 C Ca 9.3 08/25/2016) -Sensipar 60 mg PO TIW post HD -Renvela 800 mg PO TID   Assessment/Plan: 1.  Swelling/R chest wall pain: Possible clotted AVG. Ask VVS to see pt. De-cannulate AVG.  AVG is clotted, [facility]VS has seen and recommended refer to IR for thrombolysis. IR saw patient and has said ok for dc home and they will see her tomorrow as OP for attempted thrombolysis.   2.  ESRD -  TTS partial tx today for 2 hours. No labs yet. No acute needs for HD.  3.  Hypertension/volume  - BP 114/69 on adm. Takes midodrine 10 mg PO prior to HD.  4.  Anemia  - HGB 9.8. No OP ESA. Cont. Weekly venofer, follow HGB>  5.  Metabolic bone disease -  Check lab results when available. Continue binders/ VDRA/sensipar with HD.  6.  Nutrition - Renal diet when able to eat. Renal Vits/Nepro.   Rita H. Manson PasseyBrown, NP-C 09/13/2016, 10:56 AM  [facility] Beeper [phone]  Pt seen, examined, agree w assess/plan as above with additions as indicated.  Vinson Moselleob Chidi Shirer MD [facility] pager [phone]    cell [phone] 09/13/2016, 2:36 PM

## 2016-09-13 NOTE — Discharge Instructions (Signed)
Please read and follow all provided instructions.  Your diagnoses today include:  1. Hemodialysis catheter dysfunction, initial encounter [facility](HCC)    Tests performed today include:  Vital signs. See below for your results today.   Medications prescribed:   None  Take any prescribed medications only as directed.  Home care instructions:  Follow any educational materials contained in this packet.  BE VERY CAREFUL not to take multiple medicines containing Tylenol (also called acetaminophen). Doing so can lead to an overdose which can damage your liver and cause liver failure and possibly death.   Follow-up instructions: Follow-up with the interventional radiologist tomorrow as instructed for evaluation of your dialysis graft.   Return instructions:   Please return to the Emergency Department if you experience worsening symptoms.   Please return if you have any other emergent concerns.  Additional Information:  Your vital signs today were: BP (!) 86/55    Pulse 81    Temp 98.6 F (37 C) (Oral)    Resp 16    Ht 5\' 5"  (1.651 m)    Wt 74.4 kg    SpO2 94%    BMI 27.29 kg/m  If your blood pressure (BP) was elevated above 135/85 this visit, please have this repeated by your doctor within one month. --------------

## 2016-09-13 NOTE — ED Notes (Signed)
Patient transported to X-ray 

## 2016-09-13 NOTE — Progress Notes (Signed)
AV Fistula de-accessed with no complications, no bleeding, pressure dressing applied. Will be monitored by nurse

## 2016-09-13 NOTE — ED Triage Notes (Signed)
Pt in from dialysis center with c/o R arm swelling/pain during HD session, possible infiltration. Pt was halfway through (2hrs) trx and pt started c/o R upper arm/chest swelling/pain. Pt arrives with noted swelling to R chest and R upper arm. R arm fistula still accessed. Pt has dementia, a&ox3. BP 98/60 for EMS

## 2016-09-13 NOTE — ED Notes (Signed)
Vascular PA at bedside  

## 2016-09-14 ENCOUNTER — Other Ambulatory Visit: Payer: Self-pay | Admitting: Radiology

## 2016-09-14 ENCOUNTER — Encounter (HOSPITAL_COMMUNITY): Payer: Self-pay | Admitting: Interventional Radiology

## 2016-09-14 ENCOUNTER — Ambulatory Visit (HOSPITAL_COMMUNITY)
Admission: RE | Admit: 2016-09-14 | Discharge: 2016-09-14 | Disposition: A | Discharge status: Home / Self Care | Payer: Medicare Other | Source: Ambulatory Visit | Attending: Nurse Practitioner | Admitting: Nurse Practitioner

## 2016-09-14 DIAGNOSIS — Z992 Dependence on renal dialysis: Secondary | ICD-10-CM | POA: Insufficient documentation

## 2016-09-14 DIAGNOSIS — Y838 Other surgical procedures as the cause of abnormal reaction of the patient, or of later complication, without mention of misadventure at the time of the procedure: Secondary | ICD-10-CM | POA: Diagnosis not present

## 2016-09-14 DIAGNOSIS — Z7951 Long term (current) use of inhaled steroids: Secondary | ICD-10-CM | POA: Diagnosis not present

## 2016-09-14 DIAGNOSIS — T82868A Thrombosis of vascular prosthetic devices, implants and grafts, initial encounter: Secondary | ICD-10-CM | POA: Diagnosis not present

## 2016-09-14 DIAGNOSIS — Y832 Surgical operation with anastomosis, bypass or graft as the cause of abnormal reaction of the patient, or of later complication, without mention of misadventure at the time of the procedure: Secondary | ICD-10-CM | POA: Diagnosis not present

## 2016-09-14 DIAGNOSIS — I97638 Postprocedural hematoma of a circulatory system organ or structure following other circulatory system procedure: Secondary | ICD-10-CM | POA: Diagnosis not present

## 2016-09-14 DIAGNOSIS — I517 Cardiomegaly: Secondary | ICD-10-CM | POA: Diagnosis not present

## 2016-09-14 DIAGNOSIS — N186 End stage renal disease: Secondary | ICD-10-CM | POA: Diagnosis not present

## 2016-09-14 HISTORY — PX: IR GENERIC HISTORICAL: IMG1180011

## 2016-09-14 LAB — PROTIME-INR
INR: 0.98
PROTHROMBIN TIME: 13 s (ref 11.4–15.2)

## 2016-09-14 MED ORDER — ALTEPLASE 2 MG IJ SOLR
INTRAMUSCULAR | Status: AC
  Filled 2016-09-14: qty 4

## 2016-09-14 MED ORDER — HEPARIN SODIUM (PORCINE) 1000 UNIT/ML IJ SOLN
INTRAMUSCULAR | Status: AC
  Filled 2016-09-14: qty 1

## 2016-09-14 MED ORDER — HEPARIN SODIUM (PORCINE) 1000 UNIT/ML IJ SOLN
INTRAMUSCULAR | Status: AC | PRN
  Administered 2016-09-14: 4000 [IU] via INTRAVENOUS

## 2016-09-14 MED ORDER — LIDOCAINE HCL 1 % IJ SOLN
INTRAMUSCULAR | Status: AC | PRN
  Administered 2016-09-14: 10 mL

## 2016-09-14 MED ORDER — LIDOCAINE HCL (PF) 1 % IJ SOLN
INTRAMUSCULAR | Status: AC
  Filled 2016-09-14: qty 5

## 2016-09-14 MED ORDER — MIDAZOLAM HCL 2 MG/2ML IJ SOLN
INTRAMUSCULAR | Status: AC
  Filled 2016-09-14: qty 4

## 2016-09-14 MED ORDER — FENTANYL CITRATE (PF) 100 MCG/2ML IJ SOLN
INTRAMUSCULAR | Status: AC | PRN
  Administered 2016-09-14 (×3): 25 ug via INTRAVENOUS

## 2016-09-14 MED ORDER — IOPAMIDOL (ISOVUE-300) INJECTION 61%
INTRAVENOUS | Status: AC
  Administered 2016-09-14: 50 mL
  Filled 2016-09-14: qty 100

## 2016-09-14 MED ORDER — MIDAZOLAM HCL 2 MG/2ML IJ SOLN
INTRAMUSCULAR | Status: AC | PRN
  Administered 2016-09-14 (×2): 1 mg via INTRAVENOUS

## 2016-09-14 MED ORDER — LIDOCAINE HCL (PF) 1 % IJ SOLN
INTRAMUSCULAR | Status: AC
  Filled 2016-09-14: qty 30

## 2016-09-14 MED ORDER — FENTANYL CITRATE (PF) 100 MCG/2ML IJ SOLN
INTRAMUSCULAR | Status: AC
  Filled 2016-09-14: qty 2

## 2016-09-14 NOTE — Sedation Documentation (Signed)
Patient is resting comfortably. 

## 2016-09-14 NOTE — Progress Notes (Signed)
Referring Physician(s):  Dr. Myra GianottiBrabham  Supervising Physician: Simonne ComeWatts, [name]  [facility]  [facility]  Chief Complaint:  Thrombosed (R)UE AVG with hematoma  Subjective: Patient seen and assessed for thrombosed (R)UE AVG with hematoma yesterday afternoon.   She returns to IR department for declotting procedure.  She remains in her usual state of health.  No new changes to her medical history as of yesterday.   Allergies: Nsaids and Latex  Medications: Prior to Admission medications   Medication Sig Start Date End Date Taking? Authorizing Provider  albuterol (PROAIR HFA) 108 (90 BASE) MCG/ACT inhaler Inhale 2 puffs into the lungs every 6 (six) hours as needed for wheezing or shortness of breath.     Historical Provider, MD  albuterol (PROVENTIL) (2.5 MG/3ML) 0.083% nebulizer solution Take 3 mLs by nebulization daily as needed for wheezing or shortness of breath.  07/07/14   Historical Provider, MD  ASPIRIN LOW DOSE 81 MG EC tablet Take 81 mg by mouth daily.  08/02/15   Historical Provider, MD  FLOVENT DISKUS 100 MCG/BLIST AEPB Inhale 2 puffs into the lungs daily as needed (for wheezing or shortness of breath).  05/19/14   Historical Provider, MD  HYDROcodone-acetaminophen (NORCO/VICODIN) 5-325 MG tablet Take 1 tablet by mouth every 6 (six) hours as needed for moderate pain or severe pain. 10/09/15   Raymond GurneyKimberly A Trinh, PA-C  midodrine (PROAMATINE) 10 MG tablet Take 1 tablet (10 mg total) by mouth 3 (three) times daily with meals. Patient taking differently: Take 10 mg by mouth Every Tuesday,Thursday,and Saturday with dialysis. Ulanda Edisonues, Thurs, and UJWSat 07/09/14   Marinda ElkAbraham Feliz Ortiz, MD  multivitamin (RENA-VIT) TABS tablet Take 1 tablet by mouth at bedtime.     Historical Provider, MD  nitroGLYCERIN (NITROSTAT) 0.4 MG SL tablet Place 0.4 mg under the tongue every 5 (five) minutes as needed for chest pain.    Historical Provider, MD  Nutritional Supplements (FEEDING SUPPLEMENT, NEPRO CARB  STEADY,) LIQD Take 237 mLs by mouth 2 (two) times daily between meals. Patient taking differently: Take 237 mLs by mouth daily as needed (feeding supplement).  06/09/14   Tora KindredMarianne L York, PA-C  RENVELA 800 MG tablet Take 800-1,600 mg by mouth 4 (four) times daily -  with meals and at bedtime. 800mg  three times daily, 1600mg  with snacks 06/21/15   Historical Provider, MD  SENSIPAR 60 MG tablet Take 60 mg by mouth daily.  07/02/15   Historical Provider, MD    Vital Signs: BP (!) 80/52 (BP Location: Left Arm)   Pulse 80   Temp 97.8 F (36.6 C)   Resp 16   SpO2 100%   Physical Exam  Constitutional: She is oriented to person, place, and time. She appears well-developed.  Cardiovascular: Normal rate, regular rhythm and normal heart sounds.   Pulmonary/Chest: Effort normal and breath sounds normal. No respiratory distress.  Abdominal: Soft.  Neurological: She is alert and oriented to person, place, and time.  Skin: Skin is warm and dry.  Psychiatric: She has a normal mood and affect. Her behavior is normal. Judgment and thought content normal.  Nursing note and vitals reviewed.  Imaging: Dg Chest 2 View  Result Date: 09/13/2016 CLINICAL DATA:  Right arm swelling and pain during dialysis. Right upper arm and chest pain and swelling. EXAM: CHEST  2 VIEW COMPARISON:  08/30/2015 FINDINGS: Previously seen right tunneled dialysis catheter has been removed. Cardiomegaly. Lungs are clear. No effusions or edema. No acute bony abnormality. IMPRESSION: Cardiomegaly.  No active  disease. Electronically Signed   By: Charlett Nose M.D.   On: 09/13/2016 11:30    Labs:  CBC:  Recent Labs  10/09/15 0744 09/13/16 1059  WBC  --  7.9  HGB 10.5* 9.8*  HCT 31.0* 30.6*  PLT  --  148*    COAGS: No results for input(s): INR, APTT in the last 8760 hours.  BMP:  Recent Labs  10/09/15 0744 09/13/16 1059  NA 139 139  K 3.2* 3.4*  CL  --  99*  CO2  --  28  GLUCOSE 78 112*  BUN  --  20  CALCIUM  --   8.2*  CREATININE  --  5.70*  GFRNONAA  --  6*  GFRAA  --  7*    LIVER FUNCTION TESTS: No results for input(s): BILITOT, AST, ALT, ALKPHOS, PROT, ALBUMIN in the last 8760 hours.  Assessment and Plan: Patient with ESRD presents with thrombosed (R)UE AVG with hematoma. She has been NPO.  She does not take blood thinners.  She has been in her usual state of health.  Will proceed with declot attempt vs temporary HD catheter placement.  Risks and benefits discussed with the patient including, but not limited to bleeding, infection, vascular injury, pulmonary embolism, need for tunneled HD catheter placement or even death. All of the patient's questions were answered, patient is agreeable to proceed. Consent signed and in chart.  Electronically Signed: Hoyt Koch 09/14/2016, 1:27 PM   I spent a total of 15 Minutes at the the patient's bedside AND on the patient's hospital floor or unit, greater than 50% of which was counseling/coordinating care for end stage renal disease.

## 2016-09-14 NOTE — Progress Notes (Signed)
Dr Grace IsaacWatts assessed graft site, pt taken down to radiology to monitor/ pt will be dc from their.

## 2016-09-14 NOTE — Sedation Documentation (Signed)
Dsg R upper arm

## 2016-09-14 NOTE — Procedures (Signed)
Technically successful declot of right upper arm graft.   EBL: Minimal No immediate post procedural complications. Access is ready for immediate use.  Katherina RightJay Delbra Zellars, MD Pager #: [phone]

## 2016-09-14 NOTE — Progress Notes (Signed)
Right bicep graft bleeding, dressing saturated. IR was called, team arrived, pressure held and dressing changed.

## 2016-09-14 NOTE — Discharge Instructions (Signed)
Fistulogram, Care After °Refer to this sheet in the next few weeks. These instructions provide you with information on caring for yourself after your procedure. Your health care provider may also give you more specific instructions. Your treatment has been planned according to current medical practices, but problems sometimes occur. Call your health care provider if you have any problems or questions after your procedure. °What can I expect after the procedure? °After your procedure, it is typical to have the following: °· A small amount of discomfort in the area where the catheters were placed. °· A small amount of bruising around the fistula. °· Sleepiness and fatigue. °Follow these instructions at home: °· Rest at home for the day following your procedure. °· Do not drive or operate heavy machinery while taking pain medicine. °· Take medicines only as directed by your health care provider. °· Do not take baths, swim, or use a hot tub until your health care provider approves. You may shower 24 hours after the procedure or as directed by your health care provider. °· There are many different ways to close and cover an incision, including stitches, skin glue, and adhesive strips. Follow your health care provider's instructions on: °¨ Incision care. °¨ Bandage (dressing) changes and removal. °¨ Incision closure removal. °· Monitor your dialysis fistula carefully. °Contact a health care provider if: °· You have drainage, redness, swelling, or pain at your catheter site. °· You have a fever. °· You have chills. °Get help right away if: °· You feel weak. °· You have trouble balancing. °· You have trouble moving your arms or legs. °· You have problems with your speech or vision. °· You can no longer feel a vibration or buzz when you put your fingers over your dialysis fistula. °· The limb that was used for the procedure: °¨ Swells. °¨ Is painful. °¨ Is cold. °¨ Is discolored, such as blue or pale white. °This information  is not intended to replace advice given to you by your health care provider. Make sure you discuss any questions you have with your health care provider. °Document Released: 11/11/2013 Document Revised: 12/03/2015 Document Reviewed: 08/16/2013 °Elsevier Interactive Patient Education © 2017 [employer] ° °

## 2016-09-19 ENCOUNTER — Encounter (HOSPITAL_COMMUNITY): Payer: Self-pay | Admitting: *Deleted

## 2016-09-19 ENCOUNTER — Emergency Department (HOSPITAL_COMMUNITY): Payer: Medicare Other

## 2016-09-19 ENCOUNTER — Emergency Department (HOSPITAL_COMMUNITY)
Admission: EM | Admit: 2016-09-19 | Discharge: 2016-09-19 | Disposition: A | Discharge status: Home / Self Care | Payer: Medicare Other | Attending: Emergency Medicine | Admitting: Emergency Medicine

## 2016-09-19 DIAGNOSIS — Y939 Activity, unspecified: Secondary | ICD-10-CM | POA: Insufficient documentation

## 2016-09-19 DIAGNOSIS — S2002XA Contusion of left breast, initial encounter: Secondary | ICD-10-CM | POA: Insufficient documentation

## 2016-09-19 DIAGNOSIS — R238 Other skin changes: Secondary | ICD-10-CM

## 2016-09-19 DIAGNOSIS — Y999 Unspecified external cause status: Secondary | ICD-10-CM | POA: Diagnosis not present

## 2016-09-19 DIAGNOSIS — N186 End stage renal disease: Secondary | ICD-10-CM | POA: Diagnosis not present

## 2016-09-19 DIAGNOSIS — Z7982 Long term (current) use of aspirin: Secondary | ICD-10-CM | POA: Insufficient documentation

## 2016-09-19 DIAGNOSIS — I120 Hypertensive chronic kidney disease with stage 5 chronic kidney disease or end stage renal disease: Secondary | ICD-10-CM | POA: Insufficient documentation

## 2016-09-19 DIAGNOSIS — X58XXXA Exposure to other specified factors, initial encounter: Secondary | ICD-10-CM | POA: Diagnosis not present

## 2016-09-19 DIAGNOSIS — S2001XA Contusion of right breast, initial encounter: Secondary | ICD-10-CM | POA: Insufficient documentation

## 2016-09-19 DIAGNOSIS — Y929 Unspecified place or not applicable: Secondary | ICD-10-CM | POA: Insufficient documentation

## 2016-09-19 DIAGNOSIS — J45909 Unspecified asthma, uncomplicated: Secondary | ICD-10-CM | POA: Diagnosis not present

## 2016-09-19 DIAGNOSIS — Z79899 Other long term (current) drug therapy: Secondary | ICD-10-CM | POA: Diagnosis not present

## 2016-09-19 DIAGNOSIS — Z91040 Latex allergy status: Secondary | ICD-10-CM | POA: Diagnosis not present

## 2016-09-19 DIAGNOSIS — R233 Spontaneous ecchymoses: Secondary | ICD-10-CM

## 2016-09-19 NOTE — Discharge Instructions (Signed)
It is not clear, exactly what the bruising is from.  Use heat on the sore area especially below the right breast, and tell the swollen area, improves.  The discoloration, bruising, will likely take 3-4 weeks to look much better.  If she develops worsening pain, swelling, cough or trouble breathing, return to the emergency department.  For pain, use Tylenol.

## 2016-09-19 NOTE — ED Notes (Signed)
Patient currently in xray ?

## 2016-09-19 NOTE — ED Provider Notes (Signed)
[facility]-EMERGENCY DEPT Provider Note   CSN: [id number] Arrival date & time: 09/19/16  1428   By signing my name below, I, [name], attest that this documentation has been prepared under the direction and in the presence of [name] Bale, MD  Electronically Signed: Clovis Page, ED Scribe. 09/19/16. 3:46 PM.   History   Chief Complaint Chief Complaint  Patient presents with  . Bleeding/Bruising   The history is provided by the patient and a relative. No language interpreter was used.   HPI Comments:  [name] is a 83 y.o. female, with a PMHx of CKD, who presents to the Emergency Department complaining of acute onset, gradually worsening bruising to her bilateral breasts and chest onset 1 week. Pt reports mild associated pain. Daughter notes the pt is a dialysis pt and was recently infiltrated 3 weeks ago. No alleviating factors noted. Daughter denies any recent known falls and pt denies SOB or any other associated symptoms.   Past Medical History:  Diagnosis Date  . Anemia   . Arthritis   . Asthma   . CKD (chronic kidney disease) stage V requiring chronic dialysis (HCC)    T/TH/Sa  . Dementia   . DVT (deep venous thrombosis) (HCC)    LLE DVT 06/06/14  . Gout   . Hyperlipemia   . Hypertension     Patient Active Problem List   Diagnosis Date Noted  . Colitis 08/10/2014  . HCAP (healthcare-associated pneumonia) 07/11/2014  . Oral thrush 07/08/2014  . Productive cough 07/05/2014  . Sepsis (HCC) 07/05/2014  . Sinus tachycardia 07/05/2014  . Pleural effusion on left 07/05/2014  . Anemia of renal disease 07/05/2014  . Leukocytosis 07/05/2014  . Hypertension   . Palliative care encounter 06/16/2014  . Decreased appetite 06/16/2014  . Protein-calorie malnutrition, severe (HCC) 06/14/2014  . DVT (deep venous thrombosis) (HCC) 06/09/2014  . Anxiety state 06/09/2014  . Dementia with behavioral disturbance   . Arterial hypotension   . Hypotension 06/05/2014  . SOB  (shortness of breath) 06/05/2014  . UTI (lower urinary tract infection) 06/05/2014  . Diarrhea 06/05/2014  . Mechanical complication of other vascular device, implant, and graft 05/07/2013  . ESRD on dialysis (HCC) 05/07/2013  . Loss of weight 03/13/2013  . Hyperlipidemia 05/01/2009  . ALLERGIC RHINITIS 11/21/2007  . ACUTE LARYNGITIS, WITHOUT MENTION OF OBSTRUCTIO 07/18/2007  . GERD 05/21/2007  . GOUT 11/18/2006  . ANEMIA-NOS 11/18/2006  . HYPERTENSION 11/18/2006  . Asthma 11/18/2006  . DIVERTICULOSIS, COLON 11/18/2006  . RENAL FAILURE 11/18/2006  . CARDIOMEGALY, MILD 02/08/1994    Past Surgical History:  Procedure Laterality Date  . AV FISTULA PLACEMENT    . AV FISTULA PLACEMENT Right 10/09/2015   Procedure: INSERTION OF ARTERIOVENOUS (AV) GORE-TEX GRAFT RIGHT UPPER ARM;  Surgeon: Pryor Ochoa, MD;  Location: [facility];  Service: Vascular;  Laterality: Right;  . INSERTION OF DIALYSIS CATHETER Right 08/30/2015   Procedure: INSERTION OF DIALYSIS CATHETER RIGHT INTERNAL JUGULAR ;  Surgeon: Fransisco Hertz, MD;  Location: [facility] OR;  Service: Vascular;  Laterality: Right;  . IR GENERIC HISTORICAL  09/14/2016   IR US GUIDE VASC ACCESS RIGHT 09/14/2016 [name] Come, MD [facility]-INTERV RAD  . IR GENERIC HISTORICAL Right 09/14/2016   IR THROMBECTOMY AV FISTULA W/THROMBOLYSIS/PTA INC/SHUNT/IMG RIGHT 09/14/2016 [name] Come, MD [facility]INTER[facility]  . LIGATION OF ARTERIOVENOUS  FISTULA Left 08/30/2015   Procedure: LIGATION OF ARTERIOVENOUS  FISTULA  LEFT ARM;  Surgeon: Fransisco Hertz, MD;  Location: [facility] OR;  Service: Vascular;  Laterality: Left;  . REVISON OF ARTERIOVENOUS FISTULA Left 08/28/2015   Procedure: REVISON OF ARTERIOVENOUS FISTULA;  Surgeon: Pryor Ochoa, MD;  Location: [facility] OR;  Service: Vascular;  Laterality: Left;  . TUBAL LIGATION      OB History    No data available       Home Medications    Prior to Admission medications   Medication Sig Start Date End Date Taking? Authorizing Provider  albuterol  (PROAIR HFA) 108 (90 BASE) MCG/ACT inhaler Inhale 2 puffs into the lungs every 6 (six) hours as needed for wheezing or shortness of breath.     Historical Provider, MD  albuterol (PROVENTIL) (2.5 MG/3ML) 0.083% nebulizer solution Take 3 mLs by nebulization daily as needed for wheezing or shortness of breath.  07/07/14   Historical Provider, MD  ASPIRIN LOW DOSE 81 MG EC tablet Take 81 mg by mouth daily.  08/02/15   Historical Provider, MD  FLOVENT DISKUS 100 MCG/BLIST AEPB Inhale 2 puffs into the lungs daily as needed (for wheezing or shortness of breath).  05/19/14   Historical Provider, MD  HYDROcodone-acetaminophen (NORCO/VICODIN) 5-325 MG tablet Take 1 tablet by mouth every 6 (six) hours as needed for moderate pain or severe pain. 10/09/15   Raymond Gurney, PA-C  midodrine (PROAMATINE) 10 MG tablet Take 1 tablet (10 mg total) by mouth 3 (three) times daily with meals. Patient taking differently: Take 10 mg by mouth Every Tuesday,Thursday,and Saturday with dialysis. [name], and ZOX 07/09/14   [name] Elk, MD  multivitamin (RENA-VIT) TABS tablet Take 1 tablet by mouth at bedtime.     Historical Provider, MD  nitroGLYCERIN (NITROSTAT) 0.4 MG SL tablet Place 0.4 mg under the tongue every 5 (five) minutes as needed for chest pain.    Historical Provider, MD  Nutritional Supplements (FEEDING SUPPLEMENT, NEPRO CARB STEADY,) LIQD Take 237 mLs by mouth 2 (two) times daily between meals. Patient taking differently: Take 237 mLs by mouth daily as needed (feeding supplement).  06/09/14   [name] Kindred York, PA-C  RENVELA 800 MG tablet Take 800-1,600 mg by mouth 4 (four) times daily -  with meals and at bedtime. 800mg  three times daily, 1600mg  with snacks 06/21/15   Historical Provider, MD  SENSIPAR 60 MG tablet Take 60 mg by mouth daily.  07/02/15   Historical Provider, MD    Family History Family History  Problem Relation Age of Onset  . Colon cancer Neg Hx   . Diabetes Mellitus II Neg Hx      Social History Social History  Substance Use Topics  . Smoking status: Never Smoker  . Smokeless tobacco: Current User    Types: Snuff  . Alcohol use No     Allergies   Nsaids and Latex   Review of Systems Review of Systems  Respiratory: Negative for shortness of breath.   Musculoskeletal: Positive for myalgias.  Skin: Positive for color change.  All other systems reviewed and are negative.  Physical Exam Updated Vital Signs BP (!) 100/46 (BP Location: Left Arm)   Pulse 80   Temp 98.3 F (36.8 C) (Oral)   Resp 18   Ht 5\' 5"  (1.651 m)   Wt 164 lb (74.4 kg)   SpO2 96%   BMI 27.29 kg/m   Physical Exam  Constitutional: She is oriented to person, place, and time. She appears well-developed and well-nourished.  HENT:  Head: Normocephalic and atraumatic.  Eyes: Conjunctivae and EOM are normal. Pupils are  equal, round, and reactive to light.  Neck: Normal range of motion and phonation normal. Neck supple.  Cardiovascular: Normal rate and regular rhythm.   Vascular graft right upper arm, with normal thrill  Pulmonary/Chest: Effort normal and breath sounds normal. She exhibits no tenderness.  Abdominal: Soft. She exhibits no distension. There is no tenderness. There is no guarding.  Musculoskeletal: Normal range of motion. She exhibits tenderness.  Chest: bruising of lower chest wall and both breasts (R>L) Mild associated tenderness. Bruising of the right lower anterior upper arm with palpable mass consistent with hematoma.   Neurological: She is alert and oriented to person, place, and time. She exhibits normal muscle tone.  Sensation intact.   Skin: Skin is warm and dry.  Psychiatric: She has a normal mood and affect. Her behavior is normal. Judgment and thought content normal.  Nursing note and vitals reviewed.   ED Treatments / Results  DIAGNOSTIC STUDIES:  Oxygen Saturation is 100% on RA, normal by my interpretation.    COORDINATION OF CARE:  3:45 PM  Discussed treatment plan with pt at bedside and pt agreed to plan.  Labs (all labs ordered are listed, but only abnormal results are displayed) Labs Reviewed - No data to display  EKG  EKG Interpretation None       Radiology Dg Ribs Unilateral W/chest Right  Result Date: 09/19/2016 CLINICAL DATA:  Injury. EXAM: RIGHT RIBS AND CHEST - 3+ VIEW COMPARISON:  09/13/2016. FINDINGS: No acute bony or joint abnormality identified. Old posterolateral right rib fractures. No evidence of acute displaced fracture or pneumothorax. Mild bibasilar subsegmental atelectasis. Small effusion. IMPRESSION: 1.  No pneumothorax.  Mild bibasilar subsegmental atelectasis. 2.  Old right posterolateral rib fractures. Electronically Signed   By: Maisie Fus  Register   On: 09/19/2016 15:22    Procedures Procedures (including critical care time)  Medications Ordered in ED Medications - No data to display   Initial Impression / Assessment and Plan / ED Course  I have reviewed the triage vital signs and the nursing notes.  Pertinent labs & imaging results that were available during my care of the patient were reviewed by me and considered in my medical decision making (see chart for details).     Medications - No data to display  Patient Vitals for the past 24 hrs:  BP Temp Temp src Pulse Resp SpO2 Height Weight  09/19/16 1721 (!) 100/46 - - 80 18 96 % - -  09/19/16 1442 - - - - - - 5\' 5"  (1.651 m) 164 lb (74.4 kg)  09/19/16 1441 94/66 98.3 F (36.8 C) Oral 102 18 100 % - -    At discharge- reevaluation with update and discussion. After initial assessment and treatment, an updated evaluation reveals no change in clinical status, findings discussed with patient's daughter, all questions were answered. Laranda Burkemper L    Final Clinical Impressions(s) / ED Diagnoses   Final diagnoses:  Abnormal bruising    Unusual bruising pattern, following recent subcutaneous hematoma associated with infiltration of a  dialysis graft.  No known trauma to explain chest wall mass with ecchymosis of the chest wall.  No apparent fracture, or visceral involvement.  The bruising, is subacute.  The patient is hemodynamically stable.  There is no indication for further evaluation or treatment in the emergency department or hospital setting at this time.  Nursing Notes Reviewed/ Care Coordinated Applicable Imaging Reviewed Interpretation of Laboratory Data incorporated into ED treatment  The patient appears reasonably screened and/or stabilized  for discharge and I doubt any other medical condition or other [facility] requiring further screening, evaluation, or treatment in the ED at this time prior to discharge.  Plan: Home Medications-take usual medications; Home Treatments-rest, heat on sore areas 2 or 3 times a day; return here if the recommended treatment, does not improve the symptoms; Recommended follow up-return for worsening or concerning symptoms   New Prescriptions Discharge Medication List as of 09/19/2016  5:17 PM    I personally performed the services described in this documentation, which was scribed in my presence. The recorded information has been reviewed and is accurate     [name] BaleElliott Maytte Jacot, MD 09/19/16 2052

## 2016-09-19 NOTE — ED Triage Notes (Addendum)
PT brought here by daughter, who she lives with.  Daughter noticed dark bruise to R breast and lateral chest  2 weeks ago.  Today she noticed it was hard and painful to touch.  Pt has dementia, but daughter states she is never alone at the house.  Hypotensive in triage, but daughter states that is the norm.

## 2016-09-28 ENCOUNTER — Ambulatory Visit: Payer: Medicare Other | Admitting: Podiatry

## 2016-10-07 ENCOUNTER — Encounter: Payer: Self-pay | Admitting: Podiatry

## 2016-10-07 ENCOUNTER — Ambulatory Visit (INDEPENDENT_AMBULATORY_CARE_PROVIDER_SITE_OTHER): Payer: Medicare Other | Admitting: Podiatry

## 2016-10-07 DIAGNOSIS — L600 Ingrowing nail: Secondary | ICD-10-CM | POA: Diagnosis not present

## 2016-10-10 NOTE — Progress Notes (Signed)
Subjective:     Patient ID: [name], female   DOB: 1933-05-31, 83 y.o.   MRN: [id number]  HPI patient states her third toe is going into her second toe and the nail seems to bother her   Review of Systems     Objective:   Physical Exam Neurovascular status unchanged with irritated third digit with a pressing-like phenomena into the second toe with pain when palpated    Assessment:     Chronic inflammation secondary to digital structure creating pain    Plan:     H&P condition reviewed and recommended trimming padding and possible procedure in the future. Patient be seen back to recheck and tolerated procedure today well

## 2016-11-14 ENCOUNTER — Emergency Department (HOSPITAL_COMMUNITY): Payer: Medicare Other

## 2016-11-14 ENCOUNTER — Encounter (HOSPITAL_COMMUNITY): Payer: Self-pay | Admitting: Emergency Medicine

## 2016-11-14 ENCOUNTER — Emergency Department (HOSPITAL_COMMUNITY)
Admission: EM | Admit: 2016-11-14 | Discharge: 2016-11-14 | Disposition: A | Discharge status: Home / Self Care | Payer: Medicare Other | Attending: Emergency Medicine | Admitting: Emergency Medicine

## 2016-11-14 DIAGNOSIS — R05 Cough: Secondary | ICD-10-CM | POA: Insufficient documentation

## 2016-11-14 DIAGNOSIS — N186 End stage renal disease: Secondary | ICD-10-CM | POA: Diagnosis not present

## 2016-11-14 DIAGNOSIS — Z79899 Other long term (current) drug therapy: Secondary | ICD-10-CM | POA: Diagnosis not present

## 2016-11-14 DIAGNOSIS — R062 Wheezing: Secondary | ICD-10-CM

## 2016-11-14 DIAGNOSIS — R059 Cough, unspecified: Secondary | ICD-10-CM

## 2016-11-14 DIAGNOSIS — Z91040 Latex allergy status: Secondary | ICD-10-CM | POA: Diagnosis not present

## 2016-11-14 DIAGNOSIS — Z992 Dependence on renal dialysis: Secondary | ICD-10-CM | POA: Insufficient documentation

## 2016-11-14 DIAGNOSIS — I120 Hypertensive chronic kidney disease with stage 5 chronic kidney disease or end stage renal disease: Secondary | ICD-10-CM | POA: Diagnosis not present

## 2016-11-14 DIAGNOSIS — R0602 Shortness of breath: Secondary | ICD-10-CM | POA: Diagnosis present

## 2016-11-14 MED ORDER — PREDNISONE 20 MG PO TABS
60.0000 mg | ORAL_TABLET | Freq: Once | ORAL | Status: AC
  Administered 2016-11-14: 60 mg via ORAL
  Filled 2016-11-14: qty 3

## 2016-11-14 MED ORDER — PREDNISONE 20 MG PO TABS: ORAL_TABLET | ORAL | 0 refills | Status: DC

## 2016-11-14 MED ORDER — ALBUTEROL SULFATE (2.5 MG/3ML) 0.083% IN NEBU
5.0000 mg | INHALATION_SOLUTION | Freq: Once | RESPIRATORY_TRACT | Status: AC
  Administered 2016-11-14: 5 mg via RESPIRATORY_TRACT

## 2016-11-14 MED ORDER — ALBUTEROL SULFATE (2.5 MG/3ML) 0.083% IN NEBU
5.0000 mg | INHALATION_SOLUTION | Freq: Once | RESPIRATORY_TRACT | Status: DC
  Filled 2016-11-14: qty 6

## 2016-11-14 NOTE — ED Triage Notes (Signed)
Pt arrives from home via [facility] c/o SOB with audible wheezing, started with waking.  Pt's daughter reports pt has had coldlike s/s with dry cough starting Saturday.  Pt gets dialysis T, TH, Sat, no misses.  EMS reports giving total of 15 albuterol, 1 atrovent.  Resp e/u on arrival, neb tx ongoing.

## 2016-11-14 NOTE — Discharge Instructions (Signed)
Take the prescribed medication as directed.  Can use your home albuterol inhaler when needed. Follow-up with your primary care doctor for any ongoing issues. Return to the ED for new or worsening symptoms.

## 2016-11-14 NOTE — Discharge Planning (Signed)
Pt up for discharge. EDCM reviewed chart for possible CM needs.  No needs identified or communicated.  

## 2016-11-14 NOTE — ED Provider Notes (Signed)
[facility]ENCY DEPT Provider Note   CSN: [id number] Arrival date & time: 11/14/16  0725     History   Chief Complaint Chief Complaint  Patient presents with  . Shortness of Breath    HPI [name] is a 83 y.o. female.  The history is provided by the patient and medical records.  Shortness of Breath  Associated symptoms include cough and wheezing.    83 year old female with history of anemia, arthritis, asthma, hyperlipidemia, hypertension, end-stage renal disease on hemodialysis, presenting to the ED for cough and wheezing. This was present upon waking this morning. Daughter reports patient was up a lot at night coughing. She reports cough sounds wet, but she is not able to cough up any mucus, rather he gets hung in her throat. She's not had any sick contacts. No fever or chills. She denies any chest pain. She gets dialysis Tuesday, Thursday, and Saturday. She's not missed any treatment. She did receive 2 mg of albuterol and 1 mg Atrovent with EMS and reports she feels almost back to normal. States she feels immensely better than when she woke up this morning.  Patient does report history of asthma, daughter states her asthma tends to get bad this time of year secondary to her allergies and pollen.  Past Medical History:  Diagnosis Date  . Anemia   . Arthritis   . Asthma   . CKD (chronic kidney disease) stage V requiring chronic dialysis (HCC)    T/TH/Sa  . Dementia   . DVT (deep venous thrombosis) (HCC)    LLE DVT 06/06/14  . Gout   . Hyperlipemia   . Hypertension     Patient Active Problem List   Diagnosis Date Noted  . Colitis 08/10/2014  . HCAP (healthcare-associated pneumonia) 07/11/2014  . Oral thrush 07/08/2014  . Productive cough 07/05/2014  . Sepsis (HCC) 07/05/2014  . Sinus tachycardia 07/05/2014  . Pleural effusion on left 07/05/2014  . Anemia of renal disease 07/05/2014  . Leukocytosis 07/05/2014  . Hypertension   . Palliative care encounter  06/16/2014  . Decreased appetite 06/16/2014  . Protein-calorie malnutrition, severe (HCC) 06/14/2014  . DVT (deep venous thrombosis) (HCC) 06/09/2014  . Anxiety state 06/09/2014  . Dementia with behavioral disturbance   . Arterial hypotension   . Hypotension 06/05/2014  . SOB (shortness of breath) 06/05/2014  . UTI (lower urinary tract infection) 06/05/2014  . Diarrhea 06/05/2014  . Mechanical complication of other vascular device, implant, and graft 05/07/2013  . ESRD on dialysis (HCC) 05/07/2013  . Loss of weight 03/13/2013  . Hyperlipidemia 05/01/2009  . ALLERGIC RHINITIS 11/21/2007  . ACUTE LARYNGITIS, WITHOUT MENTION OF OBSTRUCTIO 07/18/2007  . GERD 05/21/2007  . GOUT 11/18/2006  . ANEMIA-NOS 11/18/2006  . HYPERTENSION 11/18/2006  . Asthma 11/18/2006  . DIVERTICULOSIS, COLON 11/18/2006  . RENAL FAILURE 11/18/2006  . CARDIOMEGALY, MILD 02/08/1994    Past Surgical History:  Procedure Laterality Date  . AV FISTULA PLACEMENT    . AV FISTULA PLACEMENT Right 10/09/2015   Procedure: INSERTION OF ARTERIOVENOUS (AV) GORE-TEX GRAFT RIGHT UPPER ARM;  Surgeon: Pryor Ochoa, MD;  Location: [facility] OR;  Service: Vascular;  Laterality: Right;  . INSERTION OF DIALYSIS CATHETER Right 08/30/2015   Procedure: INSERTION OF DIALYSIS CATHETER RIGHT INTERNAL JUGULAR ;  Surgeon: Fransisco Hertz, MD;  Location: [facility] OR;  Service: Vascular;  Laterality: Right;  . IR GENERIC HISTORICAL  09/14/2016   IR US GUIDE VASC ACCESS RIGHT 09/14/2016 [name], MD [facility]  .  IR GENERIC HISTORICAL Right 09/14/2016   IR THROMBECTOMY AV FISTULA W/THROMBOLYSIS/PTA INC/SHUNT/IMG RIGHT 09/14/2016 [name], MD [facility]  . LIGATION OF ARTERIOVENOUS  FISTULA Left 08/30/2015   Procedure: LIGATION OF ARTERIOVENOUS  FISTULA  LEFT ARM;  Surgeon: Fransisco Hertz, MD;  Location: [facility] OR;  Service: Vascular;  Laterality: Left;  . REVISON OF ARTERIOVENOUS FISTULA Left 08/28/2015   Procedure: REVISON OF ARTERIOVENOUS FISTULA;  Surgeon:  Pryor Ochoa, MD;  Location: [facility] OR;  Service: Vascular;  Laterality: Left;  . TUBAL LIGATION      OB History    No data available       Home Medications    Prior to Admission medications   Medication Sig Start Date End Date Taking? Authorizing Provider  albuterol (PROAIR HFA) 108 (90 BASE) MCG/ACT inhaler Inhale 2 puffs into the lungs every 6 (six) hours as needed for wheezing or shortness of breath.     [provider]  albuterol (PROVENTIL) (2.5 MG/3ML) 0.083% nebulizer solution Take 3 mLs by nebulization daily as needed for wheezing or shortness of breath.  07/07/14   [provider]  ASPIRIN LOW DOSE 81 MG EC tablet Take 81 mg by mouth daily.  08/02/15   [provider]  FLOVENT DISKUS 100 MCG/BLIST AEPB Inhale 2 puffs into the lungs daily as needed (for wheezing or shortness of breath).  05/19/14   [provider]  HYDROcodone-acetaminophen (NORCO/VICODIN) 5-325 MG tablet Take 1 tablet by mouth every 6 (six) hours as needed for moderate pain or severe pain. 10/09/15   Raymond Gurney, PA-C  midodrine (PROAMATINE) 10 MG tablet Take 1 tablet (10 mg total) by mouth 3 (three) times daily with meals. Patient taking differently: Take 10 mg by mouth Every Tuesday,Thursday,and Saturday with dialysis. [name], and ZOX 07/09/14   [name], MD  multivitamin (RENA-VIT) TABS tablet Take 1 tablet by mouth at bedtime.     [provider]  nitroGLYCERIN (NITROSTAT) 0.4 MG SL tablet Place 0.4 mg under the tongue every 5 (five) minutes as needed for chest pain.    [provider]  Nutritional Supplements (FEEDING SUPPLEMENT, NEPRO CARB STEADY,) LIQD Take 237 mLs by mouth 2 (two) times daily between meals. Patient taking differently: Take 237 mLs by mouth daily as needed (feeding supplement).  06/09/14   York, Marianne L, PA-C  RENVELA 800 MG tablet Take 800-1,600 mg by mouth 4 (four) times daily -  with meals and at bedtime. 800mg   three times daily, 1600mg  with snacks 06/21/15   [provider]  SENSIPAR 60 MG tablet Take 60 mg by mouth daily.  07/02/15   [provider]    Family History Family History  Problem Relation Age of Onset  . Colon cancer Neg Hx   . Diabetes Mellitus II Neg Hx     Social History Social History  Substance Use Topics  . Smoking status: Never Smoker  . Smokeless tobacco: Current User    Types: Snuff  . Alcohol use No     Allergies   Nsaids and Latex   Review of Systems Review of Systems  Respiratory: Positive for cough, shortness of breath and wheezing.   All other systems reviewed and are negative.    Physical Exam Updated Vital Signs BP 119/60 (BP Location: Right Arm)   Pulse (!) 109   Temp 98.4 F (36.9 C) (Axillary)   Resp (!) 22   Ht 5\' 3"  (1.6 m)   Wt 72.6 kg  SpO2 100%   BMI 28.34 kg/m   Physical Exam  Constitutional: She is oriented to person, place, and time. She appears well-developed and well-nourished.  Elderly, pleasant, not distressed  HENT:  Head: Normocephalic and atraumatic.  Mouth/Throat: Oropharynx is clear and moist.  Eyes: Conjunctivae and EOM are normal. Pupils are equal, round, and reactive to light.  Neck: Normal range of motion.  Cardiovascular: Normal rate, regular rhythm and normal heart sounds.   Pulmonary/Chest: Effort normal. No respiratory distress. She has wheezes.  Expiratory wheezes noted, no apparent distress, able to speak in sentences, O2 sats 99% on RA  Abdominal: Soft. Bowel sounds are normal.  Musculoskeletal: Normal range of motion.  Neurological: She is alert and oriented to person, place, and time.  Skin: Skin is warm and dry.  Psychiatric: She has a normal mood and affect.  Nursing note and vitals reviewed.    ED Treatments / Results  Labs (all labs ordered are listed, but only abnormal results are displayed) Labs Reviewed - No data to display  EKG  EKG  Interpretation  Date/Time:  Monday Nov 14 2016 07:37:05 EDT Ventricular Rate:  111 PR Interval:    QRS Duration: 107 QT Interval:  388 QTC Calculation: 516 R Axis:   -30 Text Interpretation:  Sinus or ectopic atrial tachycardia Atrial premature complex Incomplete RBBB and LAFB Low voltage, precordial leads Consider right ventricular hypertrophy Prolonged QT interval Baseline wander in lead(s) II III aVF V1 No significant change since last tracing Abnormal ekg Confirmed by Gerhard MunchLOCKWOOD, ROBERT  MD ([id number]) on 11/14/2016 7:46:43 AM       Radiology Dg Chest 2 View  Result Date: 11/14/2016 CLINICAL DATA:  Nonproductive cough and wheezing. History of dialysis dependent renal failure, asthma, hypertension. EXAM: CHEST  2 VIEW COMPARISON:  Chest x-ray of September 19, 2016 FINDINGS: The lungs are well-expanded. There is no focal infiltrate. There is no pleural effusion or pneumothorax or pneumomediastinum. The cardiac silhouette is enlarged. The pulmonary vascularity is normal. There is calcification in the wall of the aortic arch. The trachea is midline. The bony thorax exhibits no acute abnormality. IMPRESSION: Enlargement of the cardiac silhouette without pulmonary vascular congestion or pulmonary edema. No pneumonia. Mild interstitial prominence and borderline hyperinflation consistent with known reactive airway disease. Thoracic aortic atherosclerosis. Electronically Signed   By: David  SwazilandJordan M.D.   On: 11/14/2016 08:00    Procedures Procedures (including critical care time)  Medications Ordered in ED Medications  predniSONE (DELTASONE) tablet 60 mg (60 mg Oral Given 11/14/16 0831)  albuterol (PROVENTIL) (2.5 MG/3ML) 0.083% nebulizer solution 5 mg (5 mg Nebulization Given 11/14/16 [id number])     Initial Impression / Assessment and Plan / ED Course  I have reviewed the triage vital signs and the nursing notes.  Pertinent labs & imaging results that were available during my care of the patient were reviewed  by me and considered in my medical decision making (see chart for details).  83 year old female here with cough and shortness of breath. Denies chest pain.  Daughter reports history of asthma and usually has flared up this time of year secondary to her allergies and pollen. Patient received 10 mg albuterol and 1 mg Atrovent with EMS prior to arrival and states she feels significantly better. States she feels almost back to her baseline. She does have some mild expiratory wheezes but is in no acute distress. She is able to speak in full sentences. Her vital signs are stable on room air. Chest x-ray was  obtained revealing some vascular congestion but no focal pneumonia. She does have interstitial prominence and hyperinflation consistent with her asthma. She was given additional neb and dose of oral prednisone here and she feels back to baseline. Her vitals are stable on room air. She is not missed any of her dialysis treatments, and is scheduled for treatment tomorrow. She does not have any peak T waves on her EKG and I doubt a significant hyperkalemia. Remains without chest pain.  We'll hold off on further lab work at this time since she is feeling back to baseline. Will plan to discharge home with prednisone taper. We'll continue her home inhaler as needed. Follow-up with PCP.  Discussed plan with patient and daughter, they both acknowledged understanding and agreed with plan of care.  Return precautions given for new or worsening symptoms.  Case discussed with attending physician, Dr. Jeraldine Loots, who evaluated patient and agrees with assessment and plan of care.  Final Clinical Impressions(s) / ED Diagnoses   Final diagnoses:  Cough  Wheezing    New Prescriptions Discharge Medication List as of 11/14/2016  9:29 AM    START taking these medications   Details  predniSONE (DELTASONE) 20 MG tablet Take 40 mg by mouth daily for 3 days, then 20mg  by mouth daily for 3 days, then 10mg  daily for 3 days, Print          Garlon Hatchet, PA-C 11/14/16 1110    Gerhard Munch, MD 11/16/16 2045

## 2016-11-15 ENCOUNTER — Observation Stay (HOSPITAL_COMMUNITY)
Admission: EM | Admit: 2016-11-15 | Discharge: 2016-11-16 | Disposition: A | Discharge status: Home / Self Care | Payer: Medicare Other | Attending: Internal Medicine | Admitting: Internal Medicine

## 2016-11-15 ENCOUNTER — Encounter (HOSPITAL_COMMUNITY): Payer: Self-pay | Admitting: *Deleted

## 2016-11-15 ENCOUNTER — Emergency Department (HOSPITAL_COMMUNITY): Payer: Medicare Other

## 2016-11-15 DIAGNOSIS — Z992 Dependence on renal dialysis: Secondary | ICD-10-CM | POA: Insufficient documentation

## 2016-11-15 DIAGNOSIS — D631 Anemia in chronic kidney disease: Secondary | ICD-10-CM | POA: Insufficient documentation

## 2016-11-15 DIAGNOSIS — D696 Thrombocytopenia, unspecified: Secondary | ICD-10-CM | POA: Diagnosis present

## 2016-11-15 DIAGNOSIS — Z23 Encounter for immunization: Secondary | ICD-10-CM | POA: Insufficient documentation

## 2016-11-15 DIAGNOSIS — J45901 Unspecified asthma with (acute) exacerbation: Secondary | ICD-10-CM

## 2016-11-15 DIAGNOSIS — I951 Orthostatic hypotension: Secondary | ICD-10-CM | POA: Diagnosis present

## 2016-11-15 DIAGNOSIS — E43 Unspecified severe protein-calorie malnutrition: Secondary | ICD-10-CM | POA: Insufficient documentation

## 2016-11-15 DIAGNOSIS — J9601 Acute respiratory failure with hypoxia: Secondary | ICD-10-CM | POA: Insufficient documentation

## 2016-11-15 DIAGNOSIS — J441 Chronic obstructive pulmonary disease with (acute) exacerbation: Secondary | ICD-10-CM | POA: Diagnosis present

## 2016-11-15 DIAGNOSIS — M109 Gout, unspecified: Secondary | ICD-10-CM | POA: Diagnosis not present

## 2016-11-15 DIAGNOSIS — E785 Hyperlipidemia, unspecified: Secondary | ICD-10-CM | POA: Diagnosis not present

## 2016-11-15 DIAGNOSIS — J9600 Acute respiratory failure, unspecified whether with hypoxia or hypercapnia: Secondary | ICD-10-CM | POA: Diagnosis present

## 2016-11-15 DIAGNOSIS — F17290 Nicotine dependence, other tobacco product, uncomplicated: Secondary | ICD-10-CM | POA: Diagnosis not present

## 2016-11-15 DIAGNOSIS — N185 Chronic kidney disease, stage 5: Secondary | ICD-10-CM | POA: Insufficient documentation

## 2016-11-15 DIAGNOSIS — E876 Hypokalemia: Secondary | ICD-10-CM | POA: Diagnosis not present

## 2016-11-15 DIAGNOSIS — E1122 Type 2 diabetes mellitus with diabetic chronic kidney disease: Secondary | ICD-10-CM | POA: Diagnosis not present

## 2016-11-15 DIAGNOSIS — I120 Hypertensive chronic kidney disease with stage 5 chronic kidney disease or end stage renal disease: Secondary | ICD-10-CM | POA: Insufficient documentation

## 2016-11-15 DIAGNOSIS — F411 Generalized anxiety disorder: Secondary | ICD-10-CM | POA: Insufficient documentation

## 2016-11-15 DIAGNOSIS — Z79899 Other long term (current) drug therapy: Secondary | ICD-10-CM | POA: Diagnosis not present

## 2016-11-15 DIAGNOSIS — K219 Gastro-esophageal reflux disease without esophagitis: Secondary | ICD-10-CM | POA: Diagnosis not present

## 2016-11-15 DIAGNOSIS — B370 Candidal stomatitis: Secondary | ICD-10-CM | POA: Insufficient documentation

## 2016-11-15 DIAGNOSIS — E784 Other hyperlipidemia: Secondary | ICD-10-CM

## 2016-11-15 DIAGNOSIS — Z86718 Personal history of other venous thrombosis and embolism: Secondary | ICD-10-CM | POA: Insufficient documentation

## 2016-11-15 DIAGNOSIS — N186 End stage renal disease: Secondary | ICD-10-CM | POA: Diagnosis not present

## 2016-11-15 DIAGNOSIS — I517 Cardiomegaly: Secondary | ICD-10-CM | POA: Diagnosis not present

## 2016-11-15 DIAGNOSIS — F0391 Unspecified dementia with behavioral disturbance: Secondary | ICD-10-CM | POA: Diagnosis not present

## 2016-11-15 DIAGNOSIS — I700 Atherosclerosis of aorta: Secondary | ICD-10-CM | POA: Insufficient documentation

## 2016-11-15 DIAGNOSIS — Z7982 Long term (current) use of aspirin: Secondary | ICD-10-CM | POA: Diagnosis not present

## 2016-11-15 LAB — RESPIRATORY PANEL BY PCR
Adenovirus: NOT DETECTED
Bordetella pertussis: NOT DETECTED
CORONAVIRUS 229E-RVPPCR: NOT DETECTED
CORONAVIRUS HKU1-RVPPCR: NOT DETECTED
CORONAVIRUS OC43-RVPPCR: NOT DETECTED
Chlamydophila pneumoniae: NOT DETECTED
Coronavirus NL63: NOT DETECTED
Influenza A: NOT DETECTED
Influenza B: NOT DETECTED
METAPNEUMOVIRUS-RVPPCR: NOT DETECTED
MYCOPLASMA PNEUMONIAE-RVPPCR: NOT DETECTED
PARAINFLUENZA VIRUS 1-RVPPCR: NOT DETECTED
PARAINFLUENZA VIRUS 2-RVPPCR: NOT DETECTED
Parainfluenza Virus 3: DETECTED — AB
Parainfluenza Virus 4: NOT DETECTED
Respiratory Syncytial Virus: NOT DETECTED
Rhinovirus / Enterovirus: NOT DETECTED

## 2016-11-15 LAB — BASIC METABOLIC PANEL
Anion gap: 11 (ref 5–15)
BUN: 9 mg/dL (ref 6–20)
CO2: 29 mmol/L (ref 22–32)
Calcium: 7.8 mg/dL — ABNORMAL LOW (ref 8.9–10.3)
Chloride: 97 mmol/L — ABNORMAL LOW (ref 101–111)
Creatinine, Ser: 3.75 mg/dL — ABNORMAL HIGH (ref 0.44–1.00)
GFR calc Af Amer: 12 mL/min — ABNORMAL LOW (ref >60)
GFR calc non Af Amer: 10 mL/min — ABNORMAL LOW (ref >60)
Glucose, Bld: 118 mg/dL — ABNORMAL HIGH (ref 65–99)
Potassium: 2.8 mmol/L — ABNORMAL LOW (ref 3.5–5.1)
Sodium: 137 mmol/L (ref 135–145)

## 2016-11-15 LAB — I-STAT CHEM 8, ED
BUN: 11 mg/dL (ref 6–20)
Calcium, Ion: 0.9 mmol/L — ABNORMAL LOW (ref 1.15–1.40)
Chloride: 94 mmol/L — ABNORMAL LOW (ref 101–111)
Creatinine, Ser: 3.9 mg/dL — ABNORMAL HIGH (ref 0.44–1.00)
Glucose, Bld: 122 mg/dL — ABNORMAL HIGH (ref 65–99)
HCT: 41 % (ref 36.0–46.0)
Hemoglobin: 13.9 g/dL (ref 12.0–15.0)
Potassium: 2.9 mmol/L — ABNORMAL LOW (ref 3.5–5.1)
Sodium: 137 mmol/L (ref 135–145)
TCO2: 35 mmol/L (ref 0–100)

## 2016-11-15 LAB — CBC
HEMATOCRIT: 41.1 % (ref 36.0–46.0)
HEMOGLOBIN: 12.9 g/dL (ref 12.0–15.0)
MCH: 30.9 pg (ref 26.0–34.0)
MCHC: 31.4 g/dL (ref 30.0–36.0)
MCV: 98.3 fL (ref 78.0–100.0)
Platelets: 126 10*3/uL — ABNORMAL LOW (ref 150–400)
RBC: 4.18 MIL/uL (ref 3.87–5.11)
RDW: 17.2 % — AB (ref 11.5–15.5)
WBC: 5.1 10*3/uL (ref 4.0–10.5)

## 2016-11-15 LAB — I-STAT ARTERIAL BLOOD GAS, ED
Acid-Base Excess: 7 mmol/L — ABNORMAL HIGH (ref 0.0–2.0)
Bicarbonate: 30.9 mmol/L — ABNORMAL HIGH (ref 20.0–28.0)
O2 Saturation: 98 %
TCO2: 32 mmol/L (ref 0–100)
pCO2 arterial: 40.3 mmHg (ref 32.0–48.0)
pH, Arterial: 7.493 — ABNORMAL HIGH (ref 7.350–7.450)
pO2, Arterial: 100 mmHg (ref 83.0–108.0)

## 2016-11-15 LAB — I-STAT TROPONIN, ED: Troponin i, poc: 0.03 ng/mL (ref 0.00–0.08)

## 2016-11-15 LAB — MRSA PCR SCREENING: MRSA by PCR: NEGATIVE

## 2016-11-15 LAB — BRAIN NATRIURETIC PEPTIDE: B Natriuretic Peptide: 129.1 pg/mL — ABNORMAL HIGH (ref 0.0–100.0)

## 2016-11-15 MED ORDER — SEVELAMER CARBONATE 800 MG PO TABS
800.0000 mg | ORAL_TABLET | Freq: Three times a day (TID) | ORAL | Status: DC
  Administered 2016-11-16: 800 mg via ORAL
  Filled 2016-11-15 (×2): qty 1

## 2016-11-15 MED ORDER — ALBUTEROL (5 MG/ML) CONTINUOUS INHALATION SOLN
INHALATION_SOLUTION | RESPIRATORY_TRACT | Status: AC
  Administered 2016-11-15: 12:00:00
  Filled 2016-11-15: qty 20

## 2016-11-15 MED ORDER — ACETAMINOPHEN 650 MG RE SUPP: 650.0000 mg | Freq: Four times a day (QID) | RECTAL | Status: DC | PRN

## 2016-11-15 MED ORDER — HYDROCOD POLST-CPM POLST ER 10-8 MG/5ML PO SUER
5.0000 mL | Freq: Two times a day (BID) | ORAL | Status: DC | PRN
Start: 2016-11-15 — End: 2016-11-16

## 2016-11-15 MED ORDER — ALBUTEROL (5 MG/ML) CONTINUOUS INHALATION SOLN
10.0000 mg/h | INHALATION_SOLUTION | Freq: Once | RESPIRATORY_TRACT | Status: AC
  Administered 2016-11-15: 10 mg/h via RESPIRATORY_TRACT

## 2016-11-15 MED ORDER — IPRATROPIUM BROMIDE 0.02 % IN SOLN
0.5000 mg | Freq: Once | RESPIRATORY_TRACT | Status: AC
Start: 2016-11-15 — End: 2016-11-15
  Administered 2016-11-15: 0.5 mg via RESPIRATORY_TRACT
  Filled 2016-11-15: qty 2.5

## 2016-11-15 MED ORDER — MIDODRINE HCL 5 MG PO TABS: 10.0000 mg | ORAL_TABLET | ORAL | Status: DC

## 2016-11-15 MED ORDER — ASPIRIN 81 MG PO CHEW
CHEWABLE_TABLET | ORAL | Status: AC
  Filled 2016-11-15: qty 1

## 2016-11-15 MED ORDER — METHYLPREDNISOLONE SODIUM SUCC 125 MG IJ SOLR
60.0000 mg | Freq: Four times a day (QID) | INTRAMUSCULAR | Status: DC
  Administered 2016-11-15 – 2016-11-16 (×3): 60 mg via INTRAVENOUS
  Filled 2016-11-15 (×3): qty 2

## 2016-11-15 MED ORDER — RENA-VITE PO TABS
1.0000 | ORAL_TABLET | Freq: Every day | ORAL | Status: DC
  Administered 2016-11-15: 1 via ORAL
  Filled 2016-11-15: qty 1

## 2016-11-15 MED ORDER — METHYLPREDNISOLONE SODIUM SUCC 125 MG IJ SOLR
125.0000 mg | Freq: Once | INTRAMUSCULAR | Status: AC
  Administered 2016-11-15: 125 mg via INTRAVENOUS
  Filled 2016-11-15: qty 2

## 2016-11-15 MED ORDER — PNEUMOCOCCAL VAC POLYVALENT 25 MCG/0.5ML IJ INJ
0.5000 mL | INJECTION | INTRAMUSCULAR | Status: AC
  Administered 2016-11-16: 0.5 mL via INTRAMUSCULAR
  Filled 2016-11-15: qty 0.5

## 2016-11-15 MED ORDER — HEPARIN SODIUM (PORCINE) 5000 UNIT/ML IJ SOLN
5000.0000 [IU] | Freq: Three times a day (TID) | INTRAMUSCULAR | Status: DC
  Administered 2016-11-15 – 2016-11-16 (×2): 5000 [IU] via SUBCUTANEOUS
  Filled 2016-11-15 (×2): qty 1

## 2016-11-15 MED ORDER — SEVELAMER CARBONATE 800 MG PO TABS
1600.0000 mg | ORAL_TABLET | Freq: Every day | ORAL | Status: DC | PRN
  Administered 2016-11-15: 1600 mg via ORAL

## 2016-11-15 MED ORDER — ALBUTEROL (5 MG/ML) CONTINUOUS INHALATION SOLN: 10.0000 mg/h | INHALATION_SOLUTION | RESPIRATORY_TRACT | Status: DC

## 2016-11-15 MED ORDER — ASPIRIN 81 MG PO CHEW
81.0000 mg | CHEWABLE_TABLET | Freq: Every day | ORAL | Status: DC
  Administered 2016-11-15 – 2016-11-16 (×2): 81 mg via ORAL
  Filled 2016-11-15 (×2): qty 1

## 2016-11-15 MED ORDER — IPRATROPIUM-ALBUTEROL 0.5-2.5 (3) MG/3ML IN SOLN
3.0000 mL | Freq: Four times a day (QID) | RESPIRATORY_TRACT | Status: DC
  Administered 2016-11-15 – 2016-11-16 (×2): 3 mL via RESPIRATORY_TRACT
  Filled 2016-11-15 (×3): qty 3

## 2016-11-15 MED ORDER — ALBUTEROL SULFATE (2.5 MG/3ML) 0.083% IN NEBU
INHALATION_SOLUTION | RESPIRATORY_TRACT | Status: AC
  Administered 2016-11-15: 5 mg
  Filled 2016-11-15: qty 6

## 2016-11-15 MED ORDER — ACETAMINOPHEN 325 MG PO TABS: 650.0000 mg | ORAL_TABLET | Freq: Four times a day (QID) | ORAL | Status: DC | PRN

## 2016-11-15 MED ORDER — SODIUM CHLORIDE 0.9% FLUSH
3.0000 mL | Freq: Two times a day (BID) | INTRAVENOUS | Status: DC
  Administered 2016-11-15 – 2016-11-16 (×2): 3 mL via INTRAVENOUS

## 2016-11-15 MED ORDER — POTASSIUM CHLORIDE CRYS ER 20 MEQ PO TBCR
40.0000 meq | EXTENDED_RELEASE_TABLET | Freq: Once | ORAL | Status: AC
  Administered 2016-11-15: 40 meq via ORAL
  Filled 2016-11-15: qty 2

## 2016-11-15 MED ORDER — CINACALCET HCL 30 MG PO TABS
60.0000 mg | ORAL_TABLET | Freq: Every day | ORAL | Status: DC
  Administered 2016-11-16: 60 mg via ORAL
  Filled 2016-11-15: qty 2

## 2016-11-15 NOTE — ED Notes (Signed)
Pt taken off bipap by respiratory.

## 2016-11-15 NOTE — ED Triage Notes (Signed)
Per EMS- pt was finished with dialysis when she began having SOB and wheezing. Pt was given 2 duonebs en route. Pt was seen yesterday for "respiratory" issues as well. Pt able to speak in sentences. Pt on bipap now with audible wheezing

## 2016-11-15 NOTE — ED Notes (Signed)
ED Provider at bedside. 

## 2016-11-15 NOTE — ED Provider Notes (Signed)
[facility]-EMER[facility]ENCY DEPT Provider Note   CSN: [id number] Arrival date & time: 11/15/16  1120     History   Chief Complaint Chief Complaint  Patient presents with  . Shortness of Breath    HPI [name] is a 83 y.o. female.  Seen in ED yesterday for asthma/COPD exacerbation and discharged, woke up this morning to go to dialysis and did not take steroid or any breathing treatments since last night.   The history is provided by the patient and a caregiver.  Shortness of Breath  This is a chronic problem. The current episode started less than 1 hour ago. The problem has been gradually improving. Associated symptoms include cough and wheezing. Pertinent negatives include no fever, no headaches, no sore throat, no swollen glands, no ear pain, no neck pain, no sputum production, no hemoptysis, no chest pain, no vomiting, no abdominal pain, no rash and no leg pain. It is unknown what precipitated the problem. She has tried ipratropium inhalers and beta-agonist inhalers for the symptoms. The treatment provided mild relief. She has had prior hospitalizations. She has had prior ED visits. Associated medical issues include asthma and DVT. Associated medical issues do not include PE.    Past Medical History:  Diagnosis Date  . Anemia   . Arthritis   . Asthma   . CKD (chronic kidney disease) stage V requiring chronic dialysis (HCC)    T/TH/Sa  . Dementia   . DVT (deep venous thrombosis) (HCC)    LLE DVT 06/06/14  . Gout   . Hyperlipemia   . Hypertension     Patient Active Problem List   Diagnosis Date Noted  . Acute exacerbation of COPD with asthma (HCC) 11/15/2016  . CKD (chronic kidney disease) stage V requiring chronic dialysis (HCC) 11/15/2016  . Chronic orthostatic hypotension 11/15/2016  . Acute hypokalemia 11/15/2016  . HLD (hyperlipidemia) 11/15/2016  . Acute respiratory failure (HCC) 11/15/2016  . Thrombocytopenia (HCC) 11/15/2016  . Colitis 08/10/2014  . HCAP  (healthcare-associated pneumonia) 07/11/2014  . Oral thrush 07/08/2014  . Productive cough 07/05/2014  . Sepsis (HCC) 07/05/2014  . Sinus tachycardia 07/05/2014  . Pleural effusion on left 07/05/2014  . Anemia of renal disease 07/05/2014  . Leukocytosis 07/05/2014  . Hypertension   . Palliative care encounter 06/16/2014  . Decreased appetite 06/16/2014  . Protein-calorie malnutrition, severe (HCC) 06/14/2014  . DVT (deep venous thrombosis) (HCC) 06/09/2014  . Anxiety state 06/09/2014  . Dementia with behavioral disturbance   . Arterial hypotension   . Hypotension 06/05/2014  . SOB (shortness of breath) 06/05/2014  . UTI (lower urinary tract infection) 06/05/2014  . Diarrhea 06/05/2014  . Mechanical complication of other vascular device, implant, and graft 05/07/2013  . ESRD on dialysis (HCC) 05/07/2013  . Loss of weight 03/13/2013  . Hyperlipidemia 05/01/2009  . ALLERGIC RHINITIS 11/21/2007  . ACUTE LARYNGITIS, WITHOUT MENTION OF OBSTRUCTIO 07/18/2007  . GERD 05/21/2007  . GOUT 11/18/2006  . ANEMIA-NOS 11/18/2006  . HYPERTENSION 11/18/2006  . Asthma 11/18/2006  . DIVERTICULOSIS, COLON 11/18/2006  . RENAL FAILURE 11/18/2006  . CARDIOMEGALY, MILD 02/08/1994    Past Surgical History:  Procedure Laterality Date  . AV FISTULA PLACEMENT    . AV FISTULA PLACEMENT Right 10/09/2015   Procedure: INSERTION OF ARTERIOVENOUS (AV) GORE-TEX GRAFT RIGHT UPPER ARM;  Surgeon: Pryor Ochoa, MD;  Location: [facility] OR;  Service: Vascular;  Laterality: Right;  . INSERTION OF DIALYSIS CATHETER Right 08/30/2015   Procedure: INSERTION OF DIALYSIS CATHETER RIGHT  INTERNAL JUGULAR ;  Surgeon: Fransisco Hertz, MD;  Location: [facility] OR;  Service: Vascular;  Laterality: Right;  . IR GENERIC HISTORICAL  09/14/2016   IR US GUIDE VASC ACCESS RIGHT 09/14/2016 [name], MD [facility]INTER[facility]  . IR GENERIC HISTORICAL Right 09/14/2016   IR THROMBECTOMY AV FISTULA W/THROMBOLYSIS/PTA INC/SHUNT/IMG RIGHT 09/14/2016 [name], MD  [facility]  . LIGATION OF ARTERIOVENOUS  FISTULA Left 08/30/2015   Procedure: LIGATION OF ARTERIOVENOUS  FISTULA  LEFT ARM;  Surgeon: Fransisco Hertz, MD;  Location: [facility] OR;  Service: Vascular;  Laterality: Left;  . REVISON OF ARTERIOVENOUS FISTULA Left 08/28/2015   Procedure: REVISON OF ARTERIOVENOUS FISTULA;  Surgeon: Pryor Ochoa, MD;  Location: [facility] OR;  Service: Vascular;  Laterality: Left;  . TUBAL LIGATION      OB History    No data available       Home Medications    Prior to Admission medications   Medication Sig Start Date End Date Taking? Authorizing Provider  albuterol (PROAIR HFA) 108 (90 BASE) MCG/ACT inhaler Inhale 2 puffs into the lungs every 6 (six) hours as needed for wheezing or shortness of breath.    Yes [provider]  albuterol (PROVENTIL) (2.5 MG/3ML) 0.083% nebulizer solution Take 3 mLs by nebulization daily as needed for wheezing or shortness of breath.  07/07/14  Yes [provider]  ASPIRIN LOW DOSE 81 MG EC tablet Take 81 mg by mouth daily.  08/02/15  Yes [provider]  donepezil (ARICEPT) 5 MG tablet Take 5 mg by mouth daily. 11/07/16  Yes [provider]  FLOVENT DISKUS 100 MCG/BLIST AEPB Inhale 2 puffs into the lungs daily as needed (for wheezing or shortness of breath).  05/19/14  Yes [provider]  midodrine (PROAMATINE) 10 MG tablet Take 1 tablet (10 mg total) by mouth 3 (three) times daily with meals. Patient taking differently: Take 10 mg by mouth Every Tuesday,Thursday,and Saturday with dialysis.  07/09/14  Yes [name], MD  mometasone (NASONEX) 50 MCG/ACT nasal spray Place 2 sprays into the nose at bedtime. 11/07/16  Yes [provider]  multivitamin (RENA-VIT) TABS tablet Take 1 tablet by mouth at bedtime.    Yes [provider]  Nutritional Supplements (FEEDING SUPPLEMENT, NEPRO CARB STEADY,) LIQD Take 237 mLs by mouth 2 (two) times daily between meals. Patient taking  differently: Take 237 mLs by mouth Every Tuesday,Thursday,and Saturday with dialysis.  06/09/14  Yes York, Marianne L, PA-C  predniSONE (DELTASONE) 20 MG tablet Take 40 mg by mouth daily for 3 days, then 20mg  by mouth daily for 3 days, then 10mg  daily for 3 days 11/14/16  Yes Garlon Hatchet, PA-C  RENVELA 800 MG tablet Take 800-1,600 mg by mouth See admin instructions. 800 mg three times daily with meals and 1,600 mg with snacks 06/21/15  Yes [provider]  risperiDONE (RISPERDAL) 0.5 MG tablet Take 0.5 mg by mouth at bedtime. 11/07/16  Yes [provider]  SENSIPAR 60 MG tablet Take 60 mg by mouth daily.  07/02/15  Yes [provider]  HYDROcodone-acetaminophen (NORCO/VICODIN) 5-325 MG tablet Take 1 tablet by mouth every 6 (six) hours as needed for moderate pain or severe pain. Patient not taking: Reported on 11/15/2016 10/09/15   Maris Berger A, PA-C  nitroGLYCERIN (NITROSTAT) 0.4 MG SL tablet Place 0.4 mg under the tongue every 5 (five) minutes as needed for chest pain.    [provider]    Family History Family  History  Problem Relation Age of Onset  . Colon cancer Neg Hx   . Diabetes Mellitus II Neg Hx     Social History Social History  Substance Use Topics  . Smoking status: Never Smoker  . Smokeless tobacco: Current User    Types: Snuff  . Alcohol use No     Allergies   Nsaids; Latex; and Tape   Review of Systems Review of Systems  Constitutional: Negative for chills and fever.  HENT: Negative for ear pain and sore throat.   Eyes: Negative for pain and visual disturbance.  Respiratory: Positive for cough, shortness of breath and wheezing. Negative for hemoptysis and sputum production.   Cardiovascular: Negative for chest pain and palpitations.  Gastrointestinal: Negative for abdominal pain and vomiting.  Genitourinary: Negative for dysuria and hematuria.  Musculoskeletal: Negative for arthralgias, back pain and neck pain.  Skin:  Negative for color change and rash.  Neurological: Negative for seizures, syncope and headaches.  All other systems reviewed and are negative.    Physical Exam Updated Vital Signs  ED Triage Vitals  Enc Vitals Group     BP 11/15/16 1130 (!) 0/0     Pulse Rate 11/15/16 1130 (!) 106     Resp 11/15/16 1130 (!) 24     Temp 11/15/16 1336 99.3 F (37.4 C)     Temp Source 11/15/16 1336 Rectal     SpO2 11/15/16 1129 99 %     Weight 11/15/16 1400 155 lb 13.8 oz (70.7 kg)     Height 11/15/16 1400 5\' 3"  (1.6 m)     Head Circumference --      Peak Flow --      Pain Score 11/15/16 1400 0     Pain Loc --      Pain Edu? --      Excl. in GC? --     Physical Exam  Constitutional: She is oriented to person, place, and time. She appears well-developed and well-nourished. No distress.  HENT:  Head: Normocephalic and atraumatic.  Eyes: Conjunctivae are normal. Pupils are equal, round, and reactive to light.  Neck: Normal range of motion. Neck supple.  Cardiovascular: Normal rate, regular rhythm, normal heart sounds and intact distal pulses.   No murmur heard. Pulmonary/Chest: She is in respiratory distress. She has wheezes.  On Bipap  Abdominal: Soft. There is no tenderness.  Musculoskeletal: She exhibits no edema.  Neurological: She is alert and oriented to person, place, and time.  Skin: Skin is warm and dry.  Psychiatric: She has a normal mood and affect.  Nursing note and vitals reviewed.    ED Treatments / Results  Labs (all labs ordered are listed, but only abnormal results are displayed) Labs Reviewed  RESPIRATORY PANEL BY PCR - Abnormal; Notable for the following:       Result Value   Parainfluenza Virus 3 DETECTED (*)    All other components within normal limits  CBC - Abnormal; Notable for the following:    RDW 17.2 (*)    Platelets 126 (*)    All other components within normal limits  BRAIN NATRIURETIC PEPTIDE - Abnormal; Notable for the following:    B Natriuretic  Peptide 129.1 (*)    All other components within normal limits  BASIC METABOLIC PANEL - Abnormal; Notable for the following:    Potassium 2.8 (*)    Chloride 97 (*)    Glucose, Bld 118 (*)    Creatinine, Ser 3.75 (*)  Calcium 7.8 (*)    GFR calc non Af Amer 10 (*)    GFR calc Af Amer 12 (*)    All other components within normal limits  I-STAT CHEM 8, ED - Abnormal; Notable for the following:    Potassium 2.9 (*)    Chloride 94 (*)    Creatinine, Ser 3.90 (*)    Glucose, Bld 122 (*)    Calcium, Ion 0.90 (*)    All other components within normal limits  I-STAT ARTERIAL BLOOD GAS, ED - Abnormal; Notable for the following:    pH, Arterial 7.493 (*)    Bicarbonate 30.9 (*)    Acid-Base Excess 7.0 (*)    All other components within normal limits  MRSA PCR SCREENING  MAGNESIUM  BASIC METABOLIC PANEL  I-STAT TROPOININ, ED    EKG  EKG Interpretation None       Radiology Dg Chest 2 View  Result Date: 11/14/2016 CLINICAL DATA:  Nonproductive cough and wheezing. History of dialysis dependent renal failure, asthma, hypertension. EXAM: CHEST  2 VIEW COMPARISON:  Chest x-ray of September 19, 2016 FINDINGS: The lungs are well-expanded. There is no focal infiltrate. There is no pleural effusion or pneumothorax or pneumomediastinum. The cardiac silhouette is enlarged. The pulmonary vascularity is normal. There is calcification in the wall of the aortic arch. The trachea is midline. The bony thorax exhibits no acute abnormality. IMPRESSION: Enlargement of the cardiac silhouette without pulmonary vascular congestion or pulmonary edema. No pneumonia. Mild interstitial prominence and borderline hyperinflation consistent with known reactive airway disease. Thoracic aortic atherosclerosis. Electronically Signed   By: David  [place] M.D.   On: 11/14/2016 08:00   Dg Chest Port 1 View  Result Date: 11/15/2016 CLINICAL DATA:  Shortness of breath. EXAM: PORTABLE CHEST 1 VIEW COMPARISON:  Two-view chest  x-ray. FINDINGS: The heart is enlarged. Atherosclerotic calcifications are present at the aortic arch. There is no edema or effusion. No focal airspace disease is present. The visualized soft tissues and bony thorax are unremarkable. IMPRESSION: 1. Stable cardiomegaly without evidence for failure. 2. Aortic atherosclerosis. Electronically Signed   By: Marin Roberts M.D.   On: 11/15/2016 11:52    Procedures Procedures (including critical care time)  Medications Ordered in ED Medications  albuterol (PROVENTIL,VENTOLIN) solution continuous neb (10 mg/hr Nebulization Not Given 11/15/16 1141)  ipratropium-albuterol (DUONEB) 0.5-2.5 (3) MG/3ML nebulizer solution 3 mL (3 mLs Nebulization Given 11/15/16 2103)  chlorpheniramine-HYDROcodone (TUSSIONEX) 10-8 MG/5ML suspension 5 mL (not administered)  aspirin chewable tablet 81 mg (81 mg Oral Given 11/15/16 1731)  sevelamer carbonate (RENVELA) tablet 800 mg (0 mg Oral Duplicate 11/15/16 1700)  cinacalcet (SENSIPAR) tablet 60 mg (not administered)  midodrine (PROAMATINE) tablet 10 mg (not administered)  multivitamin (RENA-VIT) tablet 1 tablet (not administered)  heparin injection 5,000 Units (5,000 Units Subcutaneous Not Given 11/15/16 1600)  sodium chloride flush (NS) 0.9 % injection 3 mL (not administered)  acetaminophen (TYLENOL) tablet 650 mg (not administered)    Or  acetaminophen (TYLENOL) suppository 650 mg (not administered)  methylPREDNISolone sodium succinate (SOLU-MEDROL) 125 mg/2 mL injection 60 mg (60 mg Intravenous Given 11/15/16 1612)  pneumococcal 23 valent vaccine (PNU-IMMUNE) injection 0.5 mL (not administered)  sevelamer carbonate (RENVELA) tablet 1,600 mg (1,600 mg Oral Given 11/15/16 1731)  albuterol (PROVENTIL) (2.5 MG/3ML) 0.083% nebulizer solution (5 mg  Given 11/15/16 1125)  methylPREDNISolone sodium succinate (SOLU-MEDROL) 125 mg/2 mL injection 125 mg (125 mg Intravenous Given 11/15/16 1315)  ipratropium (ATROVENT) nebulizer solution 0.5  mg (0.5 mg  Nebulization Given 11/15/16 1140)  albuterol (PROVENTIL, VENTOLIN) (5 MG/ML) 0.5% continuous inhalation solution (  Given 11/15/16 1141)  albuterol (PROVENTIL,VENTOLIN) solution continuous neb (10 mg/hr Nebulization Given 11/15/16 1352)  potassium chloride SA (K-DUR,KLOR-CON) CR tablet 40 mEq (40 mEq Oral Given 11/15/16 1521)     Initial Impression / Assessment and Plan / ED Course  I have reviewed the triage vital signs and the nursing notes.  Pertinent labs & imaging results that were available during my care of the patient were reviewed by me and considered in my medical decision making (see chart for details).     [name] is an 83 year old female with history of COPD, end-stage renal disease on hemodialysis, hypertension, high cholesterol, dementia, DVT who presents to the ED with shortness of breath and wheezing.  Patient's vitals at time of arrival to the ED is significant for tachycardia and tachypnea but otherwise unremarkable.  Patient was at dialysis when she developed shortness of breath and wheezing.  Patient was seen here in the ED yesterday for COPD exacerbation and was given breathing treatments and discharged to home w/ rx for steroids.  Patient states that she has not had any breathing treatments today prior to or during dialysis.  Patient has not taken her steroids as well.  She denies fever, chills. Has had her "regular" cough and congestion. Upon arrival EMS placed patient on BiPAP and gave two duo nebs.  Patient is alert upon my evaluation.  Patient is tolerating BiPAP well.  Still has diffuse wheezing throughout but decent air movement.  Patient denies chest pain and completed dialysis today.  No signs of obvious volume overload on exam.  Patient likely with COPD exacerbation and to evaluate will get blood gas, chest x-ray, CBC, BMP, BNP, troponin.  Patient with EKG that shows sinus tachycardia, no interval abnormalities, no signs of ischemia rate at about 102.   Patient started on continuous albuterol and given IV solumedrol.  Patient with chest x-ray that shows no signs of pneumonia, effusion, edema, pneumothorax.  Chest x-ray overall unremarkable.  Patient with blood gas showing mild alkalosis and normal CO2.  Patient otherwise with low potassium but otherwise unremarkable labs.  Patient with undetectable troponin and doubt ACS.  Patient with mild elevation in BNP but baseline.  Patient likely with COPD exacerbation.  Patient removed from BiPAP and continues to do well.  However, patient still with diffuse wheezing after continuous albuterol.  Will admit patient to [facility] for further care.  Patient transferred to the intermediate floor in stable condition.   Final Clinical Impressions(s) / ED Diagnoses   Final diagnoses:  Acute exacerbation of COPD with asthma [facility])  Acute hypokalemia    New Prescriptions Current Discharge Medication List       Virgina Norfolk, DO 11/15/16 2158    Margarita Grizzle, MD 11/19/16 1821

## 2016-11-15 NOTE — ED Notes (Signed)
Admitting Provider at bedside. 

## 2016-11-15 NOTE — ED Notes (Signed)
IV team at bedside 

## 2016-11-15 NOTE — H&P (Signed)
History and Physical    [name] ZOX:[id number] DOB: 1933-01-07 DOA: 11/15/2016   PCP: Fleet Contras, MD   Patient coming from/Resides with: Private residence  Admission status: Observation/SDU-it may be medically necessary to stay a minimum 2 midnights to rule out impending and/or unexpected changes in physiologic status that may differ from initial evaluation performed in the ER and/or at time of admission-consider reevaluation of admission status in 24 hours.   Chief Complaint: Shortness of breath and wheezing  HPI: [name] is a 83 y.o. female with medical history significant for CKD on hemodialysis (TTS), dialysis days orthostatic hypotension, dyslipidemia, asthmatic COPD, dementia, gout, anemia and chronic kidney disease. Initially evaluated in this ER on 5/7 for similar symptoms but had stabilized and was discharged home with prescription for albuterol inhaler and prednisone. Unfortunately patient was unable to pick up these medications after discharge. She went to dialysis as usual today and had completed her session when she began having shortness of breath and audible wheezing. EMS was called. No documentation as to whether patient was hypoxemic. Was given 2 DuoNeb's en route and was placed on BiPAP. Since arrival to ER patient has had 2 continuous albuterol nebs as well as an Atrovent neb and 125 mg of IV Solu-Medrol and has been weaned off of BiPAP to nasal cannula oxygen although she still remained slightly tachypneic and tachycardic but with much decreased work of breathing. She is still wheezing on auscultation.  ED Course:  Vital Signs: BP 113/75   Pulse (!) 111   Temp 99.3 F (37.4 C) (Rectal)   Resp (!) 21   SpO2 100%  PCXR: No pneumonia or CHF Lab data: Sodium 137, potassium 2.8, chloride 97, CO2 29, glucose 118, BUN 9, creatinine 3.75, calcium 7.8, anion gap 11, BNP 129, POC troponin 0.03, white count 5100 differential not obtained, hemoglobin 12.9,  platelets 126,000 Medications and treatments: Albuterol neb 5 mg 1, albuterol continuous neb 10 mg/hr 2, Atrovent neb 0.5 mg 1, Solu-Medrol 125 mg IV 1  Review of Systems:  In addition to the HPI above,  No Fever-chills, myalgias or other constitutional symptoms No Headache, changes with Vision or hearing, new weakness, tingling, numbness in any extremity, dizziness, dysarthria or word finding difficulty, gait disturbance or imbalance, tremors or seizure activity No problems swallowing food or Liquids, indigestion/reflux, choking or coughing while eating, abdominal pain with or after eating No Chest pain, palpitations, orthopnea  No Abdominal pain, N/V, melena,hematochezia, dark tarry stools, constipation No dysuria, malodorous urine, hematuria or flank pain No new skin rashes, lesions, masses or bruises, No new joint pains, aches, swelling or redness No recent unintentional weight gain or loss No polyuria, polydypsia or polyphagia   Past Medical History:  Diagnosis Date  . Anemia   . Arthritis   . Asthma   . CKD (chronic kidney disease) stage V requiring chronic dialysis (HCC)    T/TH/Sa  . Dementia   . DVT (deep venous thrombosis) (HCC)    LLE DVT 06/06/14  . Gout   . Hyperlipemia   . Hypertension     Past Surgical History:  Procedure Laterality Date  . AV FISTULA PLACEMENT    . AV FISTULA PLACEMENT Right 10/09/2015   Procedure: INSERTION OF ARTERIOVENOUS (AV) GORE-TEX GRAFT RIGHT UPPER ARM;  Surgeon: Pryor Ochoa, MD;  Location: [facility] OR;  Service: Vascular;  Laterality: Right;  . INSERTION OF DIALYSIS CATHETER Right 08/30/2015   Procedure: INSERTION OF DIALYSIS CATHETER RIGHT INTERNAL JUGULAR ;  Surgeon: Fransisco Hertz, MD;  Location[facility] OR;  Service: Vascular;  Laterality: Right;  . IR GENERIC HISTORICAL  09/14/2016   IR US GUIDE VASC ACCESS RIGHT 09/14/2016 [name], MD [facility]  . IR GENERIC HISTORICAL Right 09/14/2016   IR THROMBECTOMY AV FISTULA W/THROMBOLYSIS/PTA  INC/SHUNT/IMG RIGHT 09/14/2016 [name], MD [facility]  . LIGATION OF ARTERIOVENOUS  FISTULA Left 08/30/2015   Procedure: LIGATION OF ARTERIOVENOUS  FISTULA  LEFT ARM;  Surgeon: Fransisco Hertz, MD;  Location: [facility] OR;  Service: Vascular;  Laterality: Left;  . REVISON OF ARTERIOVENOUS FISTULA Left 08/28/2015   Procedure: REVISON OF ARTERIOVENOUS FISTULA;  Surgeon: Pryor Ochoa, MD;  Location: [facility] OR;  Service: Vascular;  Laterality: Left;  . TUBAL LIGATION      Social History   Social History  . Marital status: Divorced    Spouse name: N/A  . Number of children: N/A  . Years of education: N/A   Occupational History  . Not on file.   Social History Main Topics  . Smoking status: Never Smoker  . Smokeless tobacco: Current User    Types: Snuff  . Alcohol use No  . Drug use: No  . Sexual activity: Not on file   Other Topics Concern  . Not on file   Social History Narrative  . No narrative on file    Mobility: Requires rolling walker and/or cane Work history: Not obtained   Allergies  Allergen Reactions  . Nsaids Other (See Comments)    REACTION: Avoid NSAIDS(renal failure)  . Latex Hives and Swelling    Family History  Problem Relation Age of Onset  . Colon cancer Neg Hx   . Diabetes Mellitus II Neg Hx      Prior to Admission medications   Medication Sig Start Date End Date Taking? Authorizing Provider  albuterol (PROAIR HFA) 108 (90 BASE) MCG/ACT inhaler Inhale 2 puffs into the lungs every 6 (six) hours as needed for wheezing or shortness of breath.     [provider]  albuterol (PROVENTIL) (2.5 MG/3ML) 0.083% nebulizer solution Take 3 mLs by nebulization daily as needed for wheezing or shortness of breath.  07/07/14   [provider]  ASPIRIN LOW DOSE 81 MG EC tablet Take 81 mg by mouth daily.  08/02/15   [provider]  FLOVENT DISKUS 100 MCG/BLIST AEPB Inhale 2 puffs into the lungs daily as needed (for wheezing or shortness of breath).   05/19/14   [provider]  HYDROcodone-acetaminophen (NORCO/VICODIN) 5-325 MG tablet Take 1 tablet by mouth every 6 (six) hours as needed for moderate pain or severe pain. 10/09/15   Raymond Gurney, PA-C  midodrine (PROAMATINE) 10 MG tablet Take 1 tablet (10 mg total) by mouth 3 (three) times daily with meals. Patient taking differently: Take 10 mg by mouth Every Tuesday,Thursday,and Saturday with dialysis. [name], and [facility] 07/09/14   [name], MD  multivitamin (RENA-VIT) TABS tablet Take 1 tablet by mouth at bedtime.     [provider]  nitroGLYCERIN (NITROSTAT) 0.4 MG SL tablet Place 0.4 mg under the tongue every 5 (five) minutes as needed for chest pain.    [provider]  Nutritional Supplements (FEEDING SUPPLEMENT, NEPRO CARB STEADY,) LIQD Take 237 mLs by mouth 2 (two) times daily between meals. Patient taking differently: Take 237 mLs by mouth daily as needed (feeding supplement).  06/09/14   York, Marianne L, PA-C  predniSONE (DELTASONE) 20 MG tablet Take 40  mg by mouth daily for 3 days, then 20mg  by mouth daily for 3 days, then 10mg  daily for 3 days 11/14/16   Garlon Hatchet, PA-C  RENVELA 800 MG tablet Take 800-1,600 mg by mouth 4 (four) times daily -  with meals and at bedtime. 800mg  three times daily, 1600mg  with snacks 06/21/15   [provider]  SENSIPAR 60 MG tablet Take 60 mg by mouth daily.  07/02/15   [provider]    Physical Exam: Vitals:   11/15/16 1142 11/15/16 1145 11/15/16 1330 11/15/16 1336  BP:  111/67 113/75   Pulse:  (!) 109 (!) 111   Resp:  (!) 29 (!) 21   Temp:    99.3 F (37.4 C)  TempSrc:    Rectal  SpO2: 100% 99% 100%       Constitutional: NAD, calm, comfortable Eyes: PERRL, lids and conjunctivae normal ENMT: Mucous membranes are moist. Posterior pharynx clear of any exudate or lesions.Normal dentition.  Neck: normal, supple, no masses, no thyromegaly Respiratory: Diffuse expiratory  wheezing Normal respiratory effort without accessory muscle use but still mildly tachypneic. 3 L oxygen Cardiovascular: Regular rate and rhythm, no murmurs / rubs / gallops. No extremity edema. 2+ pedal pulses. No carotid bruits.  Abdomen: no tenderness, no masses palpated. No hepatosplenomegaly. Bowel sounds positive.  Musculoskeletal: no clubbing / cyanosis. No joint deformity upper and lower extremities. Good ROM, no contractures. Normal muscle tone.  Skin: no rashes, lesions, ulcers. No induration Neurologic: CN 2-12 grossly intact. Sensation intact, DTR normal. Strength 5/5 x all 4 extremities.  Psychiatric: Alert and oriented x 3. Normal mood.    Labs on Admission: I have personally reviewed following labs and imaging studies  CBC:  Recent Labs Lab 11/15/16 1252 11/15/16 1306  WBC 5.1  --   HGB 12.9 13.9  HCT 41.1 41.0  MCV 98.3  --   PLT 126*  --    Basic Metabolic Panel:  Recent Labs Lab 11/15/16 1245 11/15/16 1306  NA 137 137  K 2.8* 2.9*  CL 97* 94*  CO2 29  --   GLUCOSE 118* 122*  BUN 9 11  CREATININE 3.75* 3.90*  CALCIUM 7.8*  --    GFR: Estimated Creatinine Clearance: 10.6 mL/min (A) (by C-G formula based on SCr of 3.9 mg/dL (H)). Liver Function Tests: No results for input(s): AST, ALT, ALKPHOS, BILITOT, PROT, ALBUMIN in the last 168 hours. No results for input(s): LIPASE, AMYLASE in the last 168 hours. No results for input(s): AMMONIA in the last 168 hours. Coagulation Profile: No results for input(s): INR, PROTIME in the last 168 hours. Cardiac Enzymes: No results for input(s): CKTOTAL, CKMB, CKMBINDEX, TROPONINI in the last 168 hours. BNP (last 3 results) No results for input(s): PROBNP in the last 8760 hours. HbA1C: No results for input(s): HGBA1C in the last 72 hours. CBG: No results for input(s): GLUCAP in the last 168 hours. Lipid Profile: No results for input(s): CHOL, HDL, LDLCALC, TRIG, CHOLHDL, LDLDIRECT in the last 72 hours. Thyroid  Function Tests: No results for input(s): TSH, T4TOTAL, FREET4, T3FREE, THYROIDAB in the last 72 hours. Anemia Panel: No results for input(s): VITAMINB12, FOLATE, FERRITIN, TIBC, IRON, RETICCTPCT in the last 72 hours. Urine analysis:    Component Value Date/Time   COLORURINE YELLOW 07/02/2014 1828   APPEARANCEUR CLOUDY (A) 07/02/2014 1828   LABSPEC 1.013 07/02/2014 1828   PHURINE 8.0 07/02/2014 1828   GLUCOSEU NEGATIVE 07/02/2014 1828   HGBUR MODERATE (A) 07/02/2014 1828  BILIRUBINUR NEGATIVE 07/02/2014 1828   KETONESUR NEGATIVE 07/02/2014 1828   PROTEINUR 100 (A) 07/02/2014 1828   UROBILINOGEN 1.0 07/02/2014 1828   NITRITE NEGATIVE 07/02/2014 1828   LEUKOCYTESUR MODERATE (A) 07/02/2014 1828   Sepsis Labs: @LABRCNTIP (procalcitonin:4,lacticidven:4) )No results found for this or any previous visit (from the past 240 hour(s)).   Radiological Exams on Admission: Dg Chest 2 View  Result Date: 11/14/2016 CLINICAL DATA:  Nonproductive cough and wheezing. History of dialysis dependent renal failure, asthma, hypertension. EXAM: CHEST  2 VIEW COMPARISON:  Chest x-ray of September 19, 2016 FINDINGS: The lungs are well-expanded. There is no focal infiltrate. There is no pleural effusion or pneumothorax or pneumomediastinum. The cardiac silhouette is enlarged. The pulmonary vascularity is normal. There is calcification in the wall of the aortic arch. The trachea is midline. The bony thorax exhibits no acute abnormality. IMPRESSION: Enlargement of the cardiac silhouette without pulmonary vascular congestion or pulmonary edema. No pneumonia. Mild interstitial prominence and borderline hyperinflation consistent with known reactive airway disease. Thoracic aortic atherosclerosis. Electronically Signed   By: David  SwazilandJordan M.D.   On: 11/14/2016 08:00   Dg Chest Port 1 View  Result Date: 11/15/2016 CLINICAL DATA:  Shortness of breath. EXAM: PORTABLE CHEST 1 VIEW COMPARISON:  Two-view chest x-ray. FINDINGS: The  heart is enlarged. Atherosclerotic calcifications are present at the aortic arch. There is no edema or effusion. No focal airspace disease is present. The visualized soft tissues and bony thorax are unremarkable. IMPRESSION: 1. Stable cardiomegaly without evidence for failure. 2. Aortic atherosclerosis. Electronically Signed   By: Marin Robertshristopher  Mattern M.D.   On: 11/15/2016 11:52    EKG: (Independently reviewed) sinus tachycardia with ventricular rate 109 bpm, QTC 510 ms, RR prime pattern consistent with incomplete right bundle branch block, no acute ischemic changes  Assessment/Plan Principal Problem:    Acute respiratory failure 2/2 Acute exacerbation of COPD with asthma  -Presents with acute respiratory failure with increased work of breathing and wheezing consistent with asthmatic COPD exacerbation -Chest x-ray without infiltrate or edema -Continue supportive care with oxygen, nebs and IV Solu-Medrol -Respiratory viral panel **POSITIVE for parainfluenza -Continue stepdown status-currently stable off BiPAP but have ordered prn in the event decompensates after admission -No indication for antibiotics at this juncture  Active Problems:   CKD (chronic kidney disease) stage V requiring chronic dialysis  -Usual dialysis days are TTS -Completed full session dialysis on 5/8 -If remains hospitalized on 5/10 nephrology will need to be notified regarding need for dialysis inpatient    Acute hypokalemia -Likely secondary to multiple neb treatments with albuterol -Oral replacement and check labs in a.m. -Magnesium level    Thrombocytopenia  -Current platelets greater than 100,000 but with slow trend downward since February 2016 when platelets were 220,000 -Hemoglobin remained stable -Follow trends -For now we'll utilize subcutaneous heparin for DVT prophylaxis    Chronic orthostatic hypotension -Continue midodrine and on dialysis days    HLD (hyperlipidemia) -Not all medications prior to  admission      DVT prophylaxis: Heparin Code Status: Full-confirmed by patient and daughter at bedside Family Communication: Daughter Disposition Plan: Home Consults called: None     Montrell Cessna L. ANP-BC [facility] Pager [phone]   If 7PM-7AM, please contact night-coverage [email or web address] Password [email or web address]  11/15/2016, 2:26 PM

## 2016-11-16 DIAGNOSIS — J441 Chronic obstructive pulmonary disease with (acute) exacerbation: Secondary | ICD-10-CM | POA: Diagnosis not present

## 2016-11-16 DIAGNOSIS — J45901 Unspecified asthma with (acute) exacerbation: Secondary | ICD-10-CM | POA: Diagnosis not present

## 2016-11-16 LAB — BASIC METABOLIC PANEL
Anion gap: 14 (ref 5–15)
BUN: 18 mg/dL (ref 6–20)
CO2: 28 mmol/L (ref 22–32)
Calcium: 8.5 mg/dL — ABNORMAL LOW (ref 8.9–10.3)
Chloride: 95 mmol/L — ABNORMAL LOW (ref 101–111)
Creatinine, Ser: 5.76 mg/dL — ABNORMAL HIGH (ref 0.44–1.00)
GFR calc Af Amer: 7 mL/min — ABNORMAL LOW (ref >60)
GFR calc non Af Amer: 6 mL/min — ABNORMAL LOW (ref >60)
Glucose, Bld: 131 mg/dL — ABNORMAL HIGH (ref 65–99)
Potassium: 3.2 mmol/L — ABNORMAL LOW (ref 3.5–5.1)
Sodium: 137 mmol/L (ref 135–145)

## 2016-11-16 MED ORDER — LORAZEPAM 2 MG/ML IJ SOLN
0.5000 mg | Freq: Once | INTRAMUSCULAR | Status: AC
  Administered 2016-11-16: 0.5 mg via INTRAVENOUS
  Filled 2016-11-16: qty 1

## 2016-11-16 MED ORDER — IPRATROPIUM-ALBUTEROL 0.5-2.5 (3) MG/3ML IN SOLN: 3.0000 mL | Freq: Two times a day (BID) | RESPIRATORY_TRACT | Status: DC

## 2016-11-16 MED ORDER — MIDODRINE HCL 10 MG PO TABS: 10.0000 mg | ORAL_TABLET | ORAL | 0 refills | Status: DC

## 2016-11-16 MED ORDER — ALBUTEROL SULFATE HFA 108 (90 BASE) MCG/ACT IN AERS: 2.0000 | INHALATION_SPRAY | Freq: Four times a day (QID) | RESPIRATORY_TRACT | 0 refills | Status: DC

## 2016-11-16 MED ORDER — PREDNISONE 20 MG PO TABS: ORAL_TABLET | ORAL | 0 refills | Status: DC

## 2016-11-16 MED ORDER — PREDNISONE 20 MG PO TABS: 40.0000 mg | ORAL_TABLET | Freq: Every day | ORAL | Status: DC

## 2016-11-16 MED ORDER — FLOVENT DISKUS 100 MCG/BLIST IN AEPB: 2.0000 | INHALATION_SPRAY | Freq: Every day | RESPIRATORY_TRACT | 0 refills | Status: DC

## 2016-11-16 NOTE — Discharge Summary (Signed)
DISCHARGE SUMMARY  [name]  MR#: [id number]  DOB:04/04/1933  Date of Admission: 11/15/2016 Date of Discharge: 11/16/2016  Attending Physician:MCCLUNG,JEFFREY T  Patient's WUJ:WJXBJYN, [name] Russell, MD  Consults:  none  Disposition: D/C home   Follow-up Appts: Follow-up Information    Fleet Contras, MD Follow up in 5 day(s).   Specialty:  Internal Medicine Contact information: [address] [place] [address] [phone]           Discharge Diagnoses: Acute hypoxic respiratory failure due to COPD exacerbation and parainfluenza URI ESRD on dialysis T/Th/Sat Thrombocytopenia Chronic orthostatic hypotension HLD Dementia History of DVT  Initial presentation: 83 y.o.femalewith history of [facility]Don hemodialysis (TTS), dyslipidemia, asthmatic COPD,dementia, gout, and chronic anemia who was evaluated in the ER on 5/7 for SOB but stabilized and was discharged home.She went to dialysis as usual 5/8 and began having shortness of breath and audible wheezing after her tx. EMS was called.  After treatment with nebulizers and high-dose IV Solu-Medrol the patient was able to be weaned off of BiPAP in the emergency room but continued to require high-level oxygen support with tachypnea and tachycardia.  Hospital Course: The patient was admitted to the acute units.  She was treated with high-dose IV Solu-Medrol as well as nebulizer therapies.  She was found to be suffering with a parainfluenza viral upper respiratory infection and this was felt to likely be the inciting event that led to her bronchospasm.  With active treatment the patient's bronchospasm had ceased by 11/16/2016.  She experienced no chest pain throughout the hospital stay and denied shortness of breath at the time of her discharge.  Allergies as of 11/16/2016      Reactions   Nsaids Other (See Comments)   REACTION: Avoid NSAIDS (renal failure)   Latex Hives, Swelling   Tape Other (See Comments)   SKIN IS VERY  THIN AND TEARS EASILY      Medication List    STOP taking these medications   HYDROcodone-acetaminophen 5-325 MG tablet Commonly known as:  NORCO/VICODIN     TAKE these medications   albuterol (2.5 MG/3ML) 0.083% nebulizer solution Commonly known as:  PROVENTIL Take 3 mLs by nebulization daily as needed for wheezing or shortness of breath. What changed:  Another medication with the same name was changed. Make sure you understand how and when to take each.   albuterol 108 (90 Base) MCG/ACT inhaler Commonly known as:  PROAIR HFA Inhale 2 puffs into the lungs every 6 (six) hours. What changed:  when to take this  reasons to take this   ASPIRIN LOW DOSE 81 MG EC tablet Generic drug:  aspirin Take 81 mg by mouth daily.   donepezil 5 MG tablet Commonly known as:  ARICEPT Take 5 mg by mouth daily.   feeding supplement (NEPRO CARB STEADY) Liqd Take 237 mLs by mouth 2 (two) times daily between meals. What changed:  when to take this   FLOVENT DISKUS 100 MCG/BLIST Aepb Generic drug:  Fluticasone Propionate (Inhal) Inhale 2 puffs into the lungs daily. What changed:  when to take this  reasons to take this   midodrine 10 MG tablet Commonly known as:  PROAMATINE Take 1 tablet (10 mg total) by mouth Every Tuesday,Thursday,and Saturday with dialysis. Start taking on:  11/17/2016   mometasone 50 MCG/ACT nasal spray Commonly known as:  NASONEX Place 2 sprays into the nose at bedtime.   multivitamin Tabs tablet Take 1 tablet by mouth at bedtime.   nitroGLYCERIN 0.4 MG SL  tablet Commonly known as:  NITROSTAT Place 0.4 mg under the tongue every 5 (five) minutes as needed for chest pain.   predniSONE 20 MG tablet Commonly known as:  DELTASONE Take 40 mg by mouth daily for 3 days, then 20mg  by mouth daily for 3 days, then 10mg  daily for 3 days   RENVELA 800 MG tablet Generic drug:  sevelamer carbonate Take 800-1,600 mg by mouth See admin instructions. 800 mg three times  daily with meals and 1,600 mg with snacks   risperiDONE 0.5 MG tablet Commonly known as:  RISPERDAL Take 0.5 mg by mouth at bedtime.   SENSIPAR 60 MG tablet Generic drug:  cinacalcet Take 60 mg by mouth daily.       Day of Discharge BP (!) 114/56   Pulse 99   Temp 98.9 F (37.2 C) (Oral)   Resp 20   Ht 5\' 3"  (1.6 m)   Wt 70.4 kg (155 lb 3.3 oz)   SpO2 92%   BMI 27.49 kg/m   Physical Exam: General: No acute respiratory distress at rest in bedside chair  Lungs: Clear to auscultation bilaterally without wheezes or crackles Cardiovascular: Regular rate and rhythm without murmur gallop or rub normal S1 and S2 Abdomen: Nontender, nondistended, soft, bowel sounds positive, no rebound, no ascites, no appreciable mass Extremities: No significant cyanosis, clubbing, or edema bilateral lower extremities  Basic Metabolic Panel:  Recent Labs Lab 11/15/16 1245 11/15/16 1306 11/16/16 0638  NA 137 137 137  K 2.8* 2.9* 3.2*  CL 97* 94* 95*  CO2 29  --  28  GLUCOSE 118* 122* 131*  BUN 9 11 18   CREATININE 3.75* 3.90* 5.76*  CALCIUM 7.8*  --  8.5*   CBC:  Recent Labs Lab 11/15/16 1252 11/15/16 1306  WBC 5.1  --   HGB 12.9 13.9  HCT 41.1 41.0  MCV 98.3  --   PLT 126*  --    Recent Results (from the past 240 hour(s))  Respiratory Panel by PCR     Status: Abnormal   Collection Time: 11/15/16  2:16 PM  Result Value Ref Range Status   Adenovirus NOT DETECTED NOT DETECTED Final   Coronavirus 229E NOT DETECTED NOT DETECTED Final   Coronavirus HKU1 NOT DETECTED NOT DETECTED Final   Coronavirus NL63 NOT DETECTED NOT DETECTED Final   Coronavirus OC43 NOT DETECTED NOT DETECTED Final   Metapneumovirus NOT DETECTED NOT DETECTED Final   Rhinovirus / Enterovirus NOT DETECTED NOT DETECTED Final   Influenza A NOT DETECTED NOT DETECTED Final   Influenza B NOT DETECTED NOT DETECTED Final   Parainfluenza Virus 1 NOT DETECTED NOT DETECTED Final   Parainfluenza Virus 2 NOT DETECTED  NOT DETECTED Final   Parainfluenza Virus 3 DETECTED (A) NOT DETECTED Final   Parainfluenza Virus 4 NOT DETECTED NOT DETECTED Final   Respiratory Syncytial Virus NOT DETECTED NOT DETECTED Final   Bordetella pertussis NOT DETECTED NOT DETECTED Final   Chlamydophila pneumoniae NOT DETECTED NOT DETECTED Final   Mycoplasma pneumoniae NOT DETECTED NOT DETECTED Final  MRSA PCR Screening     Status: None   Collection Time: 11/15/16  5:16 PM  Result Value Ref Range Status   MRSA by PCR NEGATIVE NEGATIVE Final    Comment:        The GeneXpert MRSA Assay (FDA approved for NASAL specimens only), is one component of a comprehensive MRSA colonization surveillance program. It is not intended to diagnose MRSA infection nor to guide or monitor treatment  for MRSA infections.      Time spent in discharge (includes decision making & examination of pt): <30 minutes  11/16/2016, 12:27 PM   Lonia BloodJeffrey T. McClung, MD [facility] Office  [phone] Pager [phone]  On-Call/Text Page:      [email or web address]      password [email or web address]

## 2016-11-16 NOTE — Progress Notes (Signed)
Pt agitated, restless and getting OOB. Noted to be standing at foot of the bed earlier tonight, assisted back to bed without difficulty. Craige CottaKirby NP notified. Telesitter in room at this time

## 2016-11-16 NOTE — Progress Notes (Signed)
PT Cancellation Note  Patient Details Name: [name] MRN: [id number] DOB: 16-Jan-1933   Cancelled Treatment:    Reason Eval/Treat Not Completed: PT screened, no needs identified, will sign off Per OT, pt functioning close to baseline and does not require skilled therapy services. Discharge from therapy.   Blake DivineShauna A Rosemary Pentecost 11/16/2016, 2:33 PM Mylo RedShauna Sheranda Seabrooks, PT, DPT [phone]

## 2016-11-16 NOTE — Care Management Note (Addendum)
Case Management Note  Patient Details  Name: [name] MRN: [id number] Date of Birth: 1933-05-27  Subjective/Objective:                    Action/Plan:  PTA from home - independent mostly with mobility from home with daughter - has caregiver from [facility] dayshift 5 days a week pt has 24hour supervision) .  Pt confused - CM spoke with daughter [name] - daughter will transport pt home via private vehicle - denied barriers to pt returning home.   Pt has primary care provider and daughter stated there are no barriers to obtaining medications as prescribed    Expected Discharge Date:  11/16/16               Expected Discharge Plan:  Home/Self Care  In-House Referral:     Discharge planning Services  CM Consult  Post Acute Care Choice:    Choice offered to:     DME Arranged:    DME Agency:     HH Arranged:    HH Agency:     Status of Service:  Completed, signed off  If discussed at [employer] Meetings, dates discussed:    Additional Comments:  Cherylann ParrClaxton, Trevyn Lumpkin S, RN 11/16/2016, 2:27 PM

## 2016-11-16 NOTE — Discharge Instructions (Signed)

## 2016-11-16 NOTE — Evaluation (Signed)
Occupational Therapy Evaluation Patient Details Name: [name] MRN: [id number] DOB: 11-Mar-1933 Today's Date: 11/16/2016    History of Present Illness This 83 y.o. female admitted with acute respiratory failure.  Dx: + parainfluenza and COPD exacerbation.  PMH includes:  ESRD, gout, h/o DVT, asthma, dementia    Clinical Impression   Patient evaluated by Occupational Therapy with no further acute OT needs identified. All education has been completed and the patient has no further questions. Daughter present during session, and reports she feels pt is at, or close to her baseline. Family provides 24 hour assist.  See below for any follow-up Occupational Therapy or equipment needs. OT is signing off. Thank you for this referral.      Follow Up Recommendations  No OT follow up;Supervision/Assistance - 24 hour    Equipment Recommendations  None recommended by OT    Recommendations for Other Services       Precautions / Restrictions Precautions Precautions: Fall      Mobility Bed Mobility               General bed mobility comments: up in chair   Transfers Overall transfer level: Needs assistance   Transfers: Sit to/from Stand;Stand Pivot Transfers Sit to Stand: Supervision Stand pivot transfers: Supervision            Balance Overall balance assessment: Needs assistance Sitting-balance support: Feet supported Sitting balance-Leahy Scale: Good     Standing balance support: No upper extremity supported Standing balance-Leahy Scale: Fair                             ADL either performed or assessed with clinical judgement   ADL Overall ADL's : At baseline                                       General ADL Comments: daughter present and reports pt at baseline      Vision         Perception     Praxis      Pertinent Vitals/Pain Pain Assessment: Faces Faces Pain Scale: No hurt     Hand Dominance Right    Extremity/Trunk Assessment Upper Extremity Assessment Upper Extremity Assessment: Overall WFL for tasks assessed   Lower Extremity Assessment Lower Extremity Assessment: Overall WFL for tasks assessed   Cervical / Trunk Assessment Cervical / Trunk Assessment: Normal   Communication Communication Communication: Other (comment) (rambling conversation )   Cognition Arousal/Alertness: Awake/alert Behavior During Therapy: WFL for tasks assessed/performed Overall Cognitive Status: History of cognitive impairments - at baseline                                     General Comments  VSS.  daughter present and reports she feels pt is at or close to her baseline     Exercises     Shoulder Instructions      Home Living Family/patient expects to be discharged to:: Private residence Living Arrangements: Children Available Help at Discharge: Family;Available 24 hours/day Type of Home: House Home Access: Stairs to enter   Entrance Stairs-Rails: Right;Left Home Layout: One level     Bathroom Shower/Tub: Chief Strategy OfficerTub/shower unit   Bathroom Toilet: Handicapped height     Home Equipment: Cane - single point;Tub bench;Walker -  2 wheels   Additional Comments: lives with dtr and grand dtr       Prior Functioning/Environment Level of Independence: Needs assistance  Gait / Transfers Assistance Needed: Pt ambulates with SPC, but doesn't always remember to use it.  Daughter denies falls  ADL's / Homemaking Assistance Needed: Daughter reports pt requires assist for bathing and dressing to ensure thoroughness and to make sure she puts clothing on correctly.  She toileting mod I.      Comments: Dauther reports pt is up and down all night         OT Problem List: Decreased strength;Impaired balance (sitting and/or standing)      OT Treatment/Interventions:      OT Goals(Current goals can be found in the care plan section) Acute Rehab OT Goals Patient Stated Goal: Daughter is  eager to go home OT Goal Formulation: All assessment and education complete, DC therapy  OT Frequency:     Barriers to D/C:            Co-evaluation              AM-PAC PT "6 Clicks" Daily Activity     Outcome Measure Help from another person eating meals?: A Little Help from another person taking care of personal grooming?: A Little Help from another person toileting, which includes using toliet, bedpan, or urinal?: A Little Help from another person bathing (including washing, rinsing, drying)?: A Little Help from another person to put on and taking off regular upper body clothing?: A Little Help from another person to put on and taking off regular lower body clothing?: A Little 6 Click Score: 18   End of Session Equipment Utilized During Treatment: Gait belt Nurse Communication: Mobility status  Activity Tolerance: Patient tolerated treatment well Patient left: in chair;with call bell/phone within reach;with family/visitor present  OT Visit Diagnosis: Muscle weakness (generalized) (M62.81)                Time: 1300-1320 OT Time Calculation (min): 20 min Charges:  OT General Charges $OT Visit: 1 Procedure OT Evaluation $OT Eval Low Complexity: 1 Procedure G-Codes:     [name], OTR/L [phone]    [name] M 11/16/2016, 1:31 PM

## 2017-01-12 ENCOUNTER — Encounter (HOSPITAL_COMMUNITY): Payer: Self-pay | Admitting: Emergency Medicine

## 2017-01-12 ENCOUNTER — Other Ambulatory Visit: Payer: Self-pay

## 2017-01-12 DIAGNOSIS — R079 Chest pain, unspecified: Secondary | ICD-10-CM | POA: Diagnosis present

## 2017-01-12 DIAGNOSIS — D649 Anemia, unspecified: Secondary | ICD-10-CM | POA: Insufficient documentation

## 2017-01-12 DIAGNOSIS — Z91040 Latex allergy status: Secondary | ICD-10-CM | POA: Insufficient documentation

## 2017-01-12 DIAGNOSIS — J45909 Unspecified asthma, uncomplicated: Secondary | ICD-10-CM | POA: Diagnosis not present

## 2017-01-12 DIAGNOSIS — Z992 Dependence on renal dialysis: Secondary | ICD-10-CM | POA: Insufficient documentation

## 2017-01-12 DIAGNOSIS — I120 Hypertensive chronic kidney disease with stage 5 chronic kidney disease or end stage renal disease: Secondary | ICD-10-CM | POA: Insufficient documentation

## 2017-01-12 DIAGNOSIS — R0789 Other chest pain: Secondary | ICD-10-CM | POA: Insufficient documentation

## 2017-01-12 DIAGNOSIS — N186 End stage renal disease: Secondary | ICD-10-CM | POA: Insufficient documentation

## 2017-01-12 DIAGNOSIS — F17200 Nicotine dependence, unspecified, uncomplicated: Secondary | ICD-10-CM | POA: Diagnosis not present

## 2017-01-12 DIAGNOSIS — Z79899 Other long term (current) drug therapy: Secondary | ICD-10-CM | POA: Diagnosis not present

## 2017-01-12 LAB — I-STAT TROPONIN, ED: TROPONIN I, POC: 0 ng/mL (ref 0.00–0.08)

## 2017-01-12 NOTE — ED Triage Notes (Signed)
Pt to ED from home c/o central, non-radiating CP that occurred about 1 hour ago as patient was getting into the bed. Pt states it happened last night for the first time and each time it lasted only about a minute. Pt denies SOB/fevers/dizziness/N/V. Pt denies pain at this time, states she told her family and they made her come in. Pt is dialysis (Tues/Thurs/Sat - last session today). Pt states it was a 10/10, she grabbed her chest and pressed on it, and it got better. Resp e/u, skin warm/dry.

## 2017-01-13 ENCOUNTER — Emergency Department (HOSPITAL_COMMUNITY): Payer: Medicare Other

## 2017-01-13 ENCOUNTER — Emergency Department (HOSPITAL_COMMUNITY)
Admission: EM | Admit: 2017-01-13 | Discharge: 2017-01-13 | Disposition: A | Discharge status: Home / Self Care | Payer: Medicare Other | Attending: Emergency Medicine | Admitting: Emergency Medicine

## 2017-01-13 ENCOUNTER — Other Ambulatory Visit: Payer: Self-pay

## 2017-01-13 DIAGNOSIS — R0789 Other chest pain: Secondary | ICD-10-CM

## 2017-01-13 LAB — BASIC METABOLIC PANEL
ANION GAP: 10 (ref 5–15)
BUN: 19 mg/dL (ref 6–20)
CALCIUM: 9 mg/dL (ref 8.9–10.3)
CO2: 31 mmol/L (ref 22–32)
Chloride: 97 mmol/L — ABNORMAL LOW (ref 101–111)
Creatinine, Ser: 4.45 mg/dL — ABNORMAL HIGH (ref 0.44–1.00)
GFR, EST AFRICAN AMERICAN: 10 mL/min — AB (ref >60)
GFR, EST NON AFRICAN AMERICAN: 8 mL/min — AB (ref >60)
GLUCOSE: 100 mg/dL — AB (ref 65–99)
Potassium: 3.1 mmol/L — ABNORMAL LOW (ref 3.5–5.1)
SODIUM: 138 mmol/L (ref 135–145)

## 2017-01-13 LAB — CBC
HCT: 37.5 % (ref 36.0–46.0)
HEMOGLOBIN: 11.4 g/dL — AB (ref 12.0–15.0)
MCH: 29.6 pg (ref 26.0–34.0)
MCHC: 30.4 g/dL (ref 30.0–36.0)
MCV: 97.4 fL (ref 78.0–100.0)
Platelets: 210 10*3/uL (ref 150–400)
RBC: 3.85 MIL/uL — ABNORMAL LOW (ref 3.87–5.11)
RDW: 17.5 % — ABNORMAL HIGH (ref 11.5–15.5)
WBC: 5.7 10*3/uL (ref 4.0–10.5)

## 2017-01-13 LAB — I-STAT TROPONIN, ED: TROPONIN I, POC: 0.01 ng/mL (ref 0.00–0.08)

## 2017-01-13 NOTE — ED Provider Notes (Signed)
[facility]-EMERGENCY DEPT Provider Note   CSN: [id number] Arrival date & time: 01/12/17  2313 By signing my name below, I, [name], attest that this documentation has been prepared under the direction and in the presence of Devoria AlbeKnapp, Ilan Kahrs, MD. Electronically Signed: Bridgette HabermannMaria Page, ED Scribe. 01/13/17. 1:54 AM.  Time seen: 01:43 AM  History   Chief Complaint Chief Complaint  Patient presents with  . Chest Pain    HPI The history is provided by the patient. No language interpreter was used.   HPI Comments: [name] is a 83 y.o. female with h/o HLD, HTN, DVT, ESRD, asthma, anemia, and dementia, who presents to the Emergency Department complaining of acute onset of sharp intermittent, central, non-radiating chest pain beginning ~2200 tonight. Pt also has associated shortness of breath. Pain is sharp in quality and only lasts about several seconds. Pt states she was getting into bed at the time of onset. Per pt, she has had a total of about 3 episodes of chest pain with SOB; she has not had an episode since arriving to the ED. Pain is reproducible with palpation to the area and feels better if she presses on it. Denies any recent change in activity. Pt is on dialysis (Tues/Thurs/Sat) and her last session was July 5. Denies h/o cardiovascular disease. Pt further denies fever, cough, diaphoresis, chills, nausea, vomiting, or any other associated symptoms. Pt is in no pain at this time. The pain did not radiate. She denies any change in her activity recently. She has never had this before.   PCP Fleet ContrasAvbuere, Edwin, MD   Past Medical History:  Diagnosis Date  . Anemia   . Arthritis   . Asthma   . CKD (chronic kidney disease) stage V requiring chronic dialysis (HCC)    T/TH/Sa  . Dementia   . DVT (deep venous thrombosis) (HCC)    LLE DVT 06/06/14  . Gout   . Hyperlipemia   . Hypertension     Patient Active Problem List   Diagnosis Date Noted  . CKD (chronic kidney disease) stage V requiring  chronic dialysis (HCC) 11/15/2016  . Chronic orthostatic hypotension 11/15/2016  . HLD (hyperlipidemia) 11/15/2016  . Thrombocytopenia (HCC) 11/15/2016  . Colitis 08/10/2014  . HCAP (healthcare-associated pneumonia) 07/11/2014  . Oral thrush 07/08/2014  . Productive cough 07/05/2014  . Sepsis (HCC) 07/05/2014  . Sinus tachycardia 07/05/2014  . Pleural effusion on left 07/05/2014  . Anemia of renal disease 07/05/2014  . Leukocytosis 07/05/2014  . Hypertension   . Palliative care encounter 06/16/2014  . Decreased appetite 06/16/2014  . Protein-calorie malnutrition, severe (HCC) 06/14/2014  . DVT (deep venous thrombosis) (HCC) 06/09/2014  . Anxiety state 06/09/2014  . Dementia with behavioral disturbance   . Arterial hypotension   . Hypotension 06/05/2014  . SOB (shortness of breath) 06/05/2014  . UTI (lower urinary tract infection) 06/05/2014  . Diarrhea 06/05/2014  . Mechanical complication of other vascular device, implant, and graft 05/07/2013  . ESRD on dialysis (HCC) 05/07/2013  . Loss of weight 03/13/2013  . Hyperlipidemia 05/01/2009  . ALLERGIC RHINITIS 11/21/2007  . ACUTE LARYNGITIS, WITHOUT MENTION OF OBSTRUCTIO 07/18/2007  . GERD 05/21/2007  . GOUT 11/18/2006  . ANEMIA-NOS 11/18/2006  . HYPERTENSION 11/18/2006  . Asthma 11/18/2006  . DIVERTICULOSIS, COLON 11/18/2006  . RENAL FAILURE 11/18/2006  . CARDIOMEGALY, MILD 02/08/1994    Past Surgical History:  Procedure Laterality Date  . AV FISTULA PLACEMENT    . AV FISTULA PLACEMENT Right 10/09/2015  Procedure: INSERTION OF ARTERIOVENOUS (AV) GORE-TEX GRAFT RIGHT UPPER ARM;  Surgeon: Pryor Ochoa, MD;  Location: [facility] OR;  Service: Vascular;  Laterality: Right;  . INSERTION OF DIALYSIS CATHETER Right 08/30/2015   Procedure: INSERTION OF DIALYSIS CATHETER RIGHT INTERNAL JUGULAR ;  Surgeon: Fransisco Hertz, MD;  Location: [facility] OR;  Service: Vascular;  Laterality: Right;  . IR GENERIC HISTORICAL  09/14/2016   IR US GUIDE VASC  ACCESS RIGHT 09/14/2016 [name], MD [facility]INTER[facility]  . IR GENERIC HISTORICAL Right 09/14/2016   IR THROMBECTOMY AV FISTULA W/THROMBOLYSIS/PTA INC/SHUNT/IMG RIGHT 09/14/2016 [name], MD [facility]  . LIGATION OF ARTERIOVENOUS  FISTULA Left 08/30/2015   Procedure: LIGATION OF ARTERIOVENOUS  FISTULA  LEFT ARM;  Surgeon: Fransisco Hertz, MD;  Location: [facility] OR;  Service: Vascular;  Laterality: Left;  . REVISON OF ARTERIOVENOUS FISTULA Left 08/28/2015   Procedure: REVISON OF ARTERIOVENOUS FISTULA;  Surgeon: Pryor Ochoa, MD;  Location: [facility] OR;  Service: Vascular;  Laterality: Left;  . TUBAL LIGATION      OB History    No data available       Home Medications    Prior to Admission medications   Medication Sig Start Date End Date Taking? Authorizing Provider  albuterol (PROAIR HFA) 108 (90 Base) MCG/ACT inhaler Inhale 2 puffs into the lungs every 6 (six) hours. 11/16/16   Lonia Blood, MD  albuterol (PROVENTIL) (2.5 MG/3ML) 0.083% nebulizer solution Take 3 mLs by nebulization daily as needed for wheezing or shortness of breath.  07/07/14   [provider]  ASPIRIN LOW DOSE 81 MG EC tablet Take 81 mg by mouth daily.  08/02/15   [provider]  donepezil (ARICEPT) 5 MG tablet Take 5 mg by mouth daily. 11/07/16   [provider]  FLOVENT DISKUS 100 MCG/BLIST AEPB Inhale 2 puffs into the lungs daily. 11/16/16   Lonia Blood, MD  midodrine (PROAMATINE) 10 MG tablet Take 1 tablet (10 mg total) by mouth Every Tuesday,Thursday,and Saturday with dialysis. 11/17/16   Lonia Blood, MD  mometasone (NASONEX) 50 MCG/ACT nasal spray Place 2 sprays into the nose at bedtime. 11/07/16   [provider]  multivitamin (RENA-VIT) TABS tablet Take 1 tablet by mouth at bedtime.     [provider]  nitroGLYCERIN (NITROSTAT) 0.4 MG SL tablet Place 0.4 mg under the tongue every 5 (five) minutes as needed for chest pain.    [provider]  Nutritional  Supplements (FEEDING SUPPLEMENT, NEPRO CARB STEADY,) LIQD Take 237 mLs by mouth 2 (two) times daily between meals. Patient taking differently: Take 237 mLs by mouth Every Tuesday,Thursday,and Saturday with dialysis.  06/09/14   York, Marianne L, PA-C  predniSONE (DELTASONE) 20 MG tablet Take 40 mg by mouth daily for 3 days, then 20mg  by mouth daily for 3 days, then 10mg  daily for 3 days 11/16/16   Lonia Blood, MD  RENVELA 800 MG tablet Take 800-1,600 mg by mouth See admin instructions. 800 mg three times daily with meals and 1,600 mg with snacks 06/21/15   [provider]  risperiDONE (RISPERDAL) 0.5 MG tablet Take 0.5 mg by mouth at bedtime. 11/07/16   [provider]  SENSIPAR 60 MG tablet Take 60 mg by mouth daily.  07/02/15   [provider]    Family History Family History  Problem Relation Age of Onset  . Colon cancer Neg Hx   . Diabetes Mellitus II Neg Hx  Social History Social History  Substance Use Topics  . Smoking status: Never Smoker  . Smokeless tobacco: Current User    Types: Snuff  . Alcohol use No  lives at home Lives with daughter   Allergies   Nsaids; Latex; and Tape   Review of Systems Review of Systems  Constitutional: Negative for chills and fever.  Respiratory: Positive for shortness of breath.   Cardiovascular: Positive for chest pain.  Gastrointestinal: Negative for nausea and vomiting.  All other systems reviewed and are negative.  Physical Exam Updated Vital Signs BP (!) 103/56 (BP Location: Left Arm)   Pulse 79   Temp 98.5 F (36.9 C) (Oral)   Resp 18   SpO2 100%   Vital signs normal    Physical Exam  Constitutional: She is oriented to person, place, and time. She appears well-developed and well-nourished.  Non-toxic appearance. She does not appear ill. No distress.  HENT:  Head: Normocephalic and atraumatic.  Right Ear: External ear normal.  Left Ear: External ear normal.  Nose: Nose normal. No  mucosal edema or rhinorrhea.  Mouth/Throat: Oropharynx is clear and moist and mucous membranes are normal. No dental abscesses or uvula swelling.  Eyes: Conjunctivae and EOM are normal. Pupils are equal, round, and reactive to light.  Neck: Normal range of motion and full passive range of motion without pain. Neck supple.  Cardiovascular: Normal rate, regular rhythm and normal heart sounds.  Exam reveals no gallop and no friction rub.   No murmur heard. Pulmonary/Chest: Effort normal and breath sounds normal. No respiratory distress. She has no wheezes. She has no rhonchi. She has no rales. She exhibits tenderness. She exhibits no crepitus.    Tender diffusely to upper chest wall that reproduces her complaints of pain.  Abdominal: Soft. Normal appearance and bowel sounds are normal. She exhibits no distension. There is no tenderness. There is no rebound and no guarding.  Musculoskeletal: Normal range of motion. She exhibits no edema or tenderness.  Moves all extremities well.   Neurological: She is alert and oriented to person, place, and time. She has normal strength. No cranial nerve deficit.  Skin: Skin is warm, dry and intact. No rash noted. No erythema. No pallor.  Psychiatric: She has a normal mood and affect. Her speech is normal and behavior is normal. Her mood appears not anxious.  Nursing note and vitals reviewed.    ED Treatments / Results  Labs (all labs ordered are listed, but only abnormal results are displayed) Results for orders placed or performed during the hospital encounter of 01/13/17  Basic metabolic panel  Result Value Ref Range   Sodium 138 135 - 145 mmol/L   Potassium 3.1 (L) 3.5 - 5.1 mmol/L   Chloride 97 (L) 101 - 111 mmol/L   CO2 31 22 - 32 mmol/L   Glucose, Bld 100 (H) 65 - 99 mg/dL   BUN 19 6 - 20 mg/dL   Creatinine, Ser 9.52 (H) 0.44 - 1.00 mg/dL   Calcium 9.0 8.9 - 84.1 mg/dL   GFR calc non Af Amer 8 (L) >60 mL/min   GFR calc Af Amer 10 (L) >60  mL/min   Anion gap 10 5 - 15  CBC  Result Value Ref Range   WBC 5.7 4.0 - 10.5 K/uL   RBC 3.85 (L) 3.87 - 5.11 MIL/uL   Hemoglobin 11.4 (L) 12.0 - 15.0 g/dL   HCT 32.4 40.1 - 02.7 %   MCV 97.4 78.0 - 100.0 fL  MCH 29.6 26.0 - 34.0 pg   MCHC 30.4 30.0 - 36.0 g/dL   RDW 40.9 (H) 81.1 - 91.4 %   Platelets 210 150 - 400 K/uL  I-stat troponin, ED  Result Value Ref Range   Troponin i, poc 0.00 0.00 - 0.08 ng/mL   Comment 3          I-stat troponin, ED  Result Value Ref Range   Troponin i, poc 0.01 0.00 - 0.08 ng/mL   Comment 3           Laboratory interpretation all normal except Hypokalemia, chronic renal disease, minimal anemia    EKG  EKG Interpretation None     ED ECG REPORT   Date: 01/13/2017  Rate: 98  Rhythm: normal sinus rhythm  QRS Axis: left  Intervals: QT prolonged  ST/T Wave abnormalities: normal  Conduction Disutrbances:left anterior fascicular block  Narrative Interpretation:   Old EKG Reviewed: none available  I have personally reviewed the EKG tracing and agree with the computerized printout as noted.   Radiology Dg Chest 2 View  Result Date: 01/13/2017 CLINICAL DATA:  Chest pain EXAM: CHEST  2 VIEW COMPARISON:  Chest radiograph 11/15/2016 FINDINGS: Mild cardiomegaly with calcific aortic atherosclerosis. Bilateral parahilar interstitial opacities are unchanged. No pleural effusion or pneumothorax. No pulmonary edema or focal airspace consolidation. IMPRESSION: Unchanged cardiomegaly and aortic atherosclerosis without overt pulmonary edema. Electronically Signed   By: Deatra Robinson M.D.   On: 01/13/2017 00:25    Procedures Procedures   Medications Ordered in ED Medications - No data to display   Initial Impression / Assessment and Plan / ED Course  I have reviewed the triage vital signs and the nursing notes.  Pertinent labs & imaging results that were available during my care of the patient were reviewed by me and considered in my medical  decision making (see chart for details).  We discussed patient's initial laboratory testing, we discussed getting delta troponin and a 2 hour troponin was ordered. She is currently pain-free so no medications were ordered, due to her dementia she cannot tell me when her episode of chest pain was although she implies it was in the car coming to the ED.  3:10 AM Pt states she has not had an episode of chest pain since arrival to ED/prior evaluation. Discussed blood work with patient which appears to be unremarkable, showing no indication of MI. Her delta troponin was negative. At this point her chest pain is atypical and not suggestive of a cardiac event.   Final Clinical Impressions(s) / ED Diagnoses   Final diagnoses:  Atypical chest pain    Plan discharge  Devoria Albe, MD, FACEP   I personally performed the services described in this documentation, which was scribed in my presence. The recorded information has been reviewed and considered.  Devoria Albe, MD, Concha Pyo, MD 01/13/17 [phone]

## 2017-01-13 NOTE — Discharge Instructions (Signed)
Keep your dialysis appointments. Recheck if you get chest pain that is constant and lasts more than 20-30 minutes or you feel worse.

## 2017-02-01 NOTE — Progress Notes (Signed)
Occupational therapy Evaluation addendum:    11/16/16 1300  OT G-codes **NOT FOR INPATIENT CLASS**  Functional Assessment Tool Used AM-PAC 6 Clicks Daily Activity  Functional Limitation Self care  Self Care Current Status (Z3086(G8987) CK  Self Care Goal Status (V7846(G8988) CK  Self Care Discharge Status [phone](G8989) CK  Jeani HawkingWendi Amiri Tritch, OTR/L [phone]

## 2017-07-11 ENCOUNTER — Encounter (HOSPITAL_COMMUNITY): Payer: Self-pay

## 2017-07-11 ENCOUNTER — Inpatient Hospital Stay (HOSPITAL_COMMUNITY)
Admission: EM | Admit: 2017-07-11 | Discharge: 2017-07-13 | DRG: 202 | Disposition: A | Discharge status: Home / Self Care | Payer: Medicare Other | Attending: Internal Medicine | Admitting: Internal Medicine

## 2017-07-11 ENCOUNTER — Other Ambulatory Visit: Payer: Self-pay

## 2017-07-11 ENCOUNTER — Emergency Department (HOSPITAL_COMMUNITY): Payer: Medicare Other

## 2017-07-11 DIAGNOSIS — E8889 Other specified metabolic disorders: Secondary | ICD-10-CM | POA: Diagnosis present

## 2017-07-11 DIAGNOSIS — J209 Acute bronchitis, unspecified: Secondary | ICD-10-CM | POA: Diagnosis not present

## 2017-07-11 DIAGNOSIS — Z91048 Other nonmedicinal substance allergy status: Secondary | ICD-10-CM

## 2017-07-11 DIAGNOSIS — I120 Hypertensive chronic kidney disease with stage 5 chronic kidney disease or end stage renal disease: Secondary | ICD-10-CM | POA: Diagnosis present

## 2017-07-11 DIAGNOSIS — F0391 Unspecified dementia with behavioral disturbance: Secondary | ICD-10-CM | POA: Diagnosis not present

## 2017-07-11 DIAGNOSIS — N186 End stage renal disease: Secondary | ICD-10-CM | POA: Diagnosis not present

## 2017-07-11 DIAGNOSIS — D631 Anemia in chronic kidney disease: Secondary | ICD-10-CM | POA: Diagnosis present

## 2017-07-11 DIAGNOSIS — E43 Unspecified severe protein-calorie malnutrition: Secondary | ICD-10-CM | POA: Diagnosis present

## 2017-07-11 DIAGNOSIS — J45901 Unspecified asthma with (acute) exacerbation: Principal | ICD-10-CM | POA: Diagnosis present

## 2017-07-11 DIAGNOSIS — I959 Hypotension, unspecified: Secondary | ICD-10-CM | POA: Diagnosis present

## 2017-07-11 DIAGNOSIS — I9589 Other hypotension: Secondary | ICD-10-CM | POA: Diagnosis present

## 2017-07-11 DIAGNOSIS — Z992 Dependence on renal dialysis: Secondary | ICD-10-CM | POA: Diagnosis not present

## 2017-07-11 DIAGNOSIS — N189 Chronic kidney disease, unspecified: Secondary | ICD-10-CM

## 2017-07-11 DIAGNOSIS — J189 Pneumonia, unspecified organism: Secondary | ICD-10-CM

## 2017-07-11 DIAGNOSIS — N2581 Secondary hyperparathyroidism of renal origin: Secondary | ICD-10-CM | POA: Diagnosis present

## 2017-07-11 DIAGNOSIS — Z6830 Body mass index (BMI) 30.0-30.9, adult: Secondary | ICD-10-CM

## 2017-07-11 DIAGNOSIS — Z886 Allergy status to analgesic agent status: Secondary | ICD-10-CM

## 2017-07-11 DIAGNOSIS — I10 Essential (primary) hypertension: Secondary | ICD-10-CM | POA: Diagnosis present

## 2017-07-11 DIAGNOSIS — Z79899 Other long term (current) drug therapy: Secondary | ICD-10-CM

## 2017-07-11 DIAGNOSIS — J45909 Unspecified asthma, uncomplicated: Secondary | ICD-10-CM | POA: Diagnosis not present

## 2017-07-11 DIAGNOSIS — Z91040 Latex allergy status: Secondary | ICD-10-CM

## 2017-07-11 DIAGNOSIS — J205 Acute bronchitis due to respiratory syncytial virus: Secondary | ICD-10-CM | POA: Diagnosis present

## 2017-07-11 DIAGNOSIS — B338 Other specified viral diseases: Secondary | ICD-10-CM | POA: Diagnosis present

## 2017-07-11 DIAGNOSIS — B974 Respiratory syncytial virus as the cause of diseases classified elsewhere: Secondary | ICD-10-CM | POA: Diagnosis present

## 2017-07-11 DIAGNOSIS — F0390 Unspecified dementia without behavioral disturbance: Secondary | ICD-10-CM | POA: Diagnosis present

## 2017-07-11 DIAGNOSIS — Z86718 Personal history of other venous thrombosis and embolism: Secondary | ICD-10-CM

## 2017-07-11 DIAGNOSIS — K219 Gastro-esophageal reflux disease without esophagitis: Secondary | ICD-10-CM | POA: Diagnosis present

## 2017-07-11 DIAGNOSIS — Z7982 Long term (current) use of aspirin: Secondary | ICD-10-CM

## 2017-07-11 DIAGNOSIS — E785 Hyperlipidemia, unspecified: Secondary | ICD-10-CM | POA: Diagnosis present

## 2017-07-11 HISTORY — DX: Anemia in other chronic diseases classified elsewhere: D63.8

## 2017-07-11 HISTORY — DX: End stage renal disease: N18.6

## 2017-07-11 HISTORY — DX: Dependence on renal dialysis: Z99.2

## 2017-07-11 LAB — CBC WITH DIFFERENTIAL/PLATELET
Basophils Absolute: 0 10*3/uL (ref 0.0–0.1)
Basophils Relative: 1 %
EOS PCT: 1 %
Eosinophils Absolute: 0 10*3/uL (ref 0.0–0.7)
HEMATOCRIT: 37.2 % (ref 36.0–46.0)
Hemoglobin: 11.8 g/dL — ABNORMAL LOW (ref 12.0–15.0)
LYMPHS ABS: 3.1 10*3/uL (ref 0.7–4.0)
LYMPHS PCT: 41 %
MCH: 30.7 pg (ref 26.0–34.0)
MCHC: 31.7 g/dL (ref 30.0–36.0)
MCV: 96.9 fL (ref 78.0–100.0)
MONOS PCT: 7 %
Monocytes Absolute: 0.5 10*3/uL (ref 0.1–1.0)
Neutro Abs: 3.9 10*3/uL (ref 1.7–7.7)
Neutrophils Relative %: 50 %
PLATELETS: 202 10*3/uL (ref 150–400)
RBC: 3.84 MIL/uL — ABNORMAL LOW (ref 3.87–5.11)
RDW: 15.6 % — ABNORMAL HIGH (ref 11.5–15.5)
WBC: 7.6 10*3/uL (ref 4.0–10.5)

## 2017-07-11 LAB — COMPREHENSIVE METABOLIC PANEL
ALBUMIN: 3.7 g/dL (ref 3.5–5.0)
ALT: 13 U/L — ABNORMAL LOW (ref 14–54)
AST: 32 U/L (ref 15–41)
Alkaline Phosphatase: 111 U/L (ref 38–126)
Anion gap: 15 (ref 5–15)
BUN: 15 mg/dL (ref 6–20)
CHLORIDE: 98 mmol/L — AB (ref 101–111)
CO2: 23 mmol/L (ref 22–32)
CREATININE: 6.12 mg/dL — AB (ref 0.44–1.00)
Calcium: 8.8 mg/dL — ABNORMAL LOW (ref 8.9–10.3)
GFR calc Af Amer: 7 mL/min — ABNORMAL LOW (ref >60)
GFR, EST NON AFRICAN AMERICAN: 6 mL/min — AB (ref >60)
GLUCOSE: 84 mg/dL (ref 65–99)
Potassium: 4 mmol/L (ref 3.5–5.1)
Sodium: 136 mmol/L (ref 135–145)
Total Bilirubin: 0.8 mg/dL (ref 0.3–1.2)
Total Protein: 7.4 g/dL (ref 6.5–8.1)

## 2017-07-11 LAB — I-STAT CG4 LACTIC ACID, ED
Lactic Acid, Venous: 4.53 mmol/L (ref 0.5–1.9)
Lactic Acid, Venous: 3.64 mmol/L (ref 0.5–1.9)

## 2017-07-11 LAB — I-STAT TROPONIN, ED
TROPONIN I, POC: 0 ng/mL (ref 0.00–0.08)
Troponin i, poc: 0.01 ng/mL (ref 0.00–0.08)

## 2017-07-11 LAB — BRAIN NATRIURETIC PEPTIDE: B NATRIURETIC PEPTIDE 5: 96.7 pg/mL (ref 0.0–100.0)

## 2017-07-11 MED ORDER — METHYLPREDNISOLONE SODIUM SUCC 125 MG IJ SOLR
125.0000 mg | Freq: Once | INTRAMUSCULAR | Status: AC
  Administered 2017-07-11: 125 mg via INTRAVENOUS
  Filled 2017-07-11: qty 2

## 2017-07-11 MED ORDER — SEVELAMER CARBONATE 800 MG PO TABS
800.0000 mg | ORAL_TABLET | Freq: Three times a day (TID) | ORAL | Status: DC
  Administered 2017-07-12 – 2017-07-13 (×4): 800 mg via ORAL
  Filled 2017-07-11 (×4): qty 1

## 2017-07-11 MED ORDER — HEPARIN SODIUM (PORCINE) 5000 UNIT/ML IJ SOLN
5000.0000 [IU] | Freq: Three times a day (TID) | INTRAMUSCULAR | Status: DC
  Administered 2017-07-11 – 2017-07-13 (×6): 5000 [IU] via SUBCUTANEOUS
  Filled 2017-07-11 (×6): qty 1

## 2017-07-11 MED ORDER — NEPRO/CARBSTEADY PO LIQD
237.0000 mL | ORAL | Status: DC
  Administered 2017-07-13: 237 mL via ORAL

## 2017-07-11 MED ORDER — IPRATROPIUM-ALBUTEROL 0.5-2.5 (3) MG/3ML IN SOLN
3.0000 mL | Freq: Once | RESPIRATORY_TRACT | Status: AC
  Administered 2017-07-11: 3 mL via RESPIRATORY_TRACT
  Filled 2017-07-11: qty 3

## 2017-07-11 MED ORDER — SEVELAMER CARBONATE 800 MG PO TABS: 800.0000 mg | ORAL_TABLET | ORAL | Status: DC

## 2017-07-11 MED ORDER — SODIUM CHLORIDE 0.9 % IV BOLUS (SEPSIS): 1000.0000 mL | Freq: Once | INTRAVENOUS | Status: DC

## 2017-07-11 MED ORDER — ALBUTEROL SULFATE (2.5 MG/3ML) 0.083% IN NEBU
5.0000 mg | INHALATION_SOLUTION | Freq: Once | RESPIRATORY_TRACT | Status: AC
  Administered 2017-07-11: 5 mg via RESPIRATORY_TRACT
  Filled 2017-07-11: qty 6

## 2017-07-11 MED ORDER — SODIUM CHLORIDE 0.9 % IV SOLN: INTRAVENOUS | Status: DC

## 2017-07-11 MED ORDER — SODIUM CHLORIDE 0.9 % IV BOLUS (SEPSIS)
1000.0000 mL | Freq: Once | INTRAVENOUS | Status: AC
  Administered 2017-07-11: 1000 mL via INTRAVENOUS

## 2017-07-11 MED ORDER — METHYLPREDNISOLONE SODIUM SUCC 125 MG IJ SOLR
60.0000 mg | Freq: Three times a day (TID) | INTRAMUSCULAR | Status: DC
  Administered 2017-07-11 – 2017-07-13 (×6): 60 mg via INTRAVENOUS
  Filled 2017-07-11 (×6): qty 2

## 2017-07-11 MED ORDER — MIDODRINE HCL 5 MG PO TABS
10.0000 mg | ORAL_TABLET | ORAL | Status: DC
  Administered 2017-07-13 (×2): 10 mg via ORAL
  Filled 2017-07-11: qty 2

## 2017-07-11 MED ORDER — ACETAMINOPHEN 325 MG PO TABS: 650.0000 mg | ORAL_TABLET | Freq: Four times a day (QID) | ORAL | Status: DC | PRN

## 2017-07-11 MED ORDER — DEXTROSE 5 % IV SOLN
1.0000 g | INTRAVENOUS | Status: DC
  Filled 2017-07-11: qty 1

## 2017-07-11 MED ORDER — GUAIFENESIN ER 600 MG PO TB12
600.0000 mg | ORAL_TABLET | Freq: Two times a day (BID) | ORAL | Status: DC
  Administered 2017-07-11 – 2017-07-13 (×4): 600 mg via ORAL
  Filled 2017-07-11 (×4): qty 1

## 2017-07-11 MED ORDER — NITROGLYCERIN 0.4 MG SL SUBL: 0.4000 mg | SUBLINGUAL_TABLET | SUBLINGUAL | Status: DC | PRN

## 2017-07-11 MED ORDER — SODIUM CHLORIDE 0.9% FLUSH
3.0000 mL | Freq: Two times a day (BID) | INTRAVENOUS | Status: DC
  Administered 2017-07-12 – 2017-07-13 (×4): 3 mL via INTRAVENOUS

## 2017-07-11 MED ORDER — DEXTROSE 5 % IV SOLN
1.0000 g | Freq: Once | INTRAVENOUS | Status: AC
  Administered 2017-07-11: 1 g via INTRAVENOUS
  Filled 2017-07-11: qty 1

## 2017-07-11 MED ORDER — FLUTICASONE PROPIONATE 50 MCG/ACT NA SUSP
2.0000 | Freq: Every day | NASAL | Status: DC
  Administered 2017-07-12 – 2017-07-13 (×2): 2 via NASAL
  Filled 2017-07-11: qty 16

## 2017-07-11 MED ORDER — ALBUTEROL SULFATE (2.5 MG/3ML) 0.083% IN NEBU: 2.5000 mg | INHALATION_SOLUTION | RESPIRATORY_TRACT | Status: DC | PRN

## 2017-07-11 MED ORDER — RISPERIDONE 0.5 MG PO TABS
0.5000 mg | ORAL_TABLET | Freq: Every day | ORAL | Status: DC
  Administered 2017-07-11 – 2017-07-12 (×2): 0.5 mg via ORAL
  Filled 2017-07-11 (×3): qty 1

## 2017-07-11 MED ORDER — CINACALCET HCL 30 MG PO TABS
60.0000 mg | ORAL_TABLET | Freq: Every day | ORAL | Status: DC
  Administered 2017-07-12 – 2017-07-13 (×2): 60 mg via ORAL
  Filled 2017-07-11 (×2): qty 2

## 2017-07-11 MED ORDER — SODIUM CHLORIDE 0.9 % IV BOLUS (SEPSIS): 250.0000 mL | Freq: Once | INTRAVENOUS | Status: DC

## 2017-07-11 MED ORDER — SEVELAMER CARBONATE 800 MG PO TABS: 1600.0000 mg | ORAL_TABLET | ORAL | Status: DC | PRN

## 2017-07-11 MED ORDER — IPRATROPIUM-ALBUTEROL 0.5-2.5 (3) MG/3ML IN SOLN
3.0000 mL | Freq: Four times a day (QID) | RESPIRATORY_TRACT | Status: DC
  Administered 2017-07-11 – 2017-07-12 (×5): 3 mL via RESPIRATORY_TRACT
  Filled 2017-07-11 (×8): qty 3

## 2017-07-11 MED ORDER — RENA-VITE PO TABS
1.0000 | ORAL_TABLET | Freq: Every day | ORAL | Status: DC
  Administered 2017-07-12: 1 via ORAL
  Filled 2017-07-11: qty 1

## 2017-07-11 MED ORDER — VANCOMYCIN HCL 10 G IV SOLR
1500.0000 mg | Freq: Once | INTRAVENOUS | Status: AC
  Administered 2017-07-11: 1500 mg via INTRAVENOUS
  Filled 2017-07-11: qty 1500

## 2017-07-11 MED ORDER — ACETAMINOPHEN 650 MG RE SUPP: 650.0000 mg | Freq: Four times a day (QID) | RECTAL | Status: DC | PRN

## 2017-07-11 MED ORDER — ASPIRIN EC 81 MG PO TBEC
81.0000 mg | DELAYED_RELEASE_TABLET | Freq: Every day | ORAL | Status: DC
  Administered 2017-07-12 – 2017-07-13 (×2): 81 mg via ORAL
  Filled 2017-07-11 (×2): qty 1

## 2017-07-11 MED ORDER — DONEPEZIL HCL 5 MG PO TABS
5.0000 mg | ORAL_TABLET | Freq: Every day | ORAL | Status: DC
  Administered 2017-07-12 – 2017-07-13 (×2): 5 mg via ORAL
  Filled 2017-07-11 (×2): qty 1

## 2017-07-11 NOTE — ED Notes (Signed)
CODE SEPSIS ACTIVATED RN MILLIE AWARE

## 2017-07-11 NOTE — Progress Notes (Addendum)
Pharmacy Antibiotic Note  [name] is a 84 y.o. female admitted on 07/11/2017 with pneumonia. CXR with new right suprahilar nodular density. WBC WNL, currently afebrile. Pharmacy has been consulted for vancomycin and cefepime dosing. Patient with ESRD on HD TTS. Last HD session off schedule for the holidays on 12/31.   Plan: Vancomycin 1500mg  IV x1 Cefepime 1g IV q24h F/u HD plans for further dosing F/u clinical status, C&S, de-escalation, levels as appropriate  Temp (24hrs), Avg:98.4 F (36.9 C), Min:98.4 F (36.9 C), Max:98.4 F (36.9 C)  Recent Labs  Lab 07/11/17 1212 07/11/17 1248  WBC 7.6  --   CREATININE 6.12*  --   LATICACIDVEN  --  4.53*    CrCl cannot be calculated (Unknown ideal weight.).    Allergies  Allergen Reactions  . Nsaids Other (See Comments)    REACTION: Avoid NSAIDS (renal failure)  . Latex Hives and Swelling  . Tape Other (See Comments)    SKIN IS VERY THIN AND TEARS EASILY    Antimicrobials this admission: Vancomycin 1/1 >>  Cefepime 1/1 >>   Dose adjustments this admission: None  Microbiology results: 1/1 respiratory panel: ordered 1/1 BCx x2: collected  Thank you for allowing pharmacy to be a part of this patient's care.  Roderic ScarceErin N. Zigmund Danieleja, PharmD PGY1 Pharmacy Resident Pager: [phone] 07/11/2017 4:04 PM

## 2017-07-11 NOTE — ED Triage Notes (Signed)
Pt presents for evaluation of SOB with congestion and cough. Pt denies CP, reports intermittent headache.

## 2017-07-11 NOTE — Progress Notes (Signed)
Pt admitted to the unit at 1845. Pt oriented to room, staff, and call bell. Call bell within reach. Visitor guidelines reviewed w/ pt and/or family.

## 2017-07-11 NOTE — ED Provider Notes (Signed)
[facility] EMERGENCY DEPARTMENT Provider Note   CSN: [id number] Arrival date & time: 07/11/17  1200     History   Chief Complaint Chief Complaint  Patient presents with  . Shortness of Breath    HPI [name] is a 84 y.o. female with history of CKD on HD TTS, hyperlipidemia, COPD, anemia, DVT presents to ED for evaluation of wheezing, shortness of breath, wet sounding cough and yesterday, gradually worsening. Headache began today, mild and gradual in onset. Daughter but states every time she takes a deep breath sounds there is bubbles behind her throat. Had full session of dialysis yesterday. Shortness of breath is worse with exertion and lying flat. No fevers, chills, chest pain, nausea, vomiting, abdominal pain or swelling, lower extremity edema, dizziness, vision changes, neck stiffness. She still produces small amount of urine, denies dysuria. No known sick contacts.  HPI  Past Medical History:  Diagnosis Date  . Anemia   . Arthritis   . Asthma   . CKD (chronic kidney disease) stage V requiring chronic dialysis (HCC)    T/TH/Sa  . Dementia   . DVT (deep venous thrombosis) (HCC)    LLE DVT 06/06/14  . Gout   . Hyperlipemia   . Hypertension     Patient Active Problem List   Diagnosis Date Noted  . CKD (chronic kidney disease) stage V requiring chronic dialysis (HCC) 11/15/2016  . Chronic orthostatic hypotension 11/15/2016  . HLD (hyperlipidemia) 11/15/2016  . Thrombocytopenia (HCC) 11/15/2016  . Colitis 08/10/2014  . HCAP (healthcare-associated pneumonia) 07/11/2014  . Oral thrush 07/08/2014  . Productive cough 07/05/2014  . Sepsis (HCC) 07/05/2014  . Sinus tachycardia 07/05/2014  . Pleural effusion on left 07/05/2014  . Anemia of renal disease 07/05/2014  . Leukocytosis 07/05/2014  . Hypertension   . Palliative care encounter 06/16/2014  . Decreased appetite 06/16/2014  . Protein-calorie malnutrition, severe (HCC) 06/14/2014  . DVT  (deep venous thrombosis) (HCC) 06/09/2014  . Anxiety state 06/09/2014  . Dementia with behavioral disturbance   . Arterial hypotension   . Hypotension 06/05/2014  . SOB (shortness of breath) 06/05/2014  . UTI (lower urinary tract infection) 06/05/2014  . Diarrhea 06/05/2014  . Mechanical complication of other vascular device, implant, and graft 05/07/2013  . ESRD on dialysis (HCC) 05/07/2013  . Loss of weight 03/13/2013  . Hyperlipidemia 05/01/2009  . ALLERGIC RHINITIS 11/21/2007  . ACUTE LARYNGITIS, WITHOUT MENTION OF OBSTRUCTIO 07/18/2007  . GERD 05/21/2007  . GOUT 11/18/2006  . ANEMIA-NOS 11/18/2006  . HYPERTENSION 11/18/2006  . Asthma 11/18/2006  . DIVERTICULOSIS, COLON 11/18/2006  . RENAL FAILURE 11/18/2006  . CARDIOMEGALY, MILD 02/08/1994    Past Surgical History:  Procedure Laterality Date  . AV FISTULA PLACEMENT    . AV FISTULA PLACEMENT Right 10/09/2015   Procedure: INSERTION OF ARTERIOVENOUS (AV) GORE-TEX GRAFT RIGHT UPPER ARM;  Surgeon: Pryor Ochoa, MD;  Location: [facility] OR;  Service: Vascular;  Laterality: Right;  . INSERTION OF DIALYSIS CATHETER Right 08/30/2015   Procedure: INSERTION OF DIALYSIS CATHETER RIGHT INTERNAL JUGULAR ;  Surgeon: Fransisco Hertz, MD;  Location: [facility] OR;  Service: Vascular;  Laterality: Right;  . IR GENERIC HISTORICAL  09/14/2016   IR US GUIDE VASC ACCESS RIGHT 09/14/2016 [name], MD [facility]INTER[facility]  . IR GENERIC HISTORICAL Right 09/14/2016   IR THROMBECTOMY AV FISTULA W/THROMBOLYSIS/PTA INC/SHUNT/IMG RIGHT 09/14/2016 [name], MD [facility]  . LIGATION OF ARTERIOVENOUS  FISTULA Left 08/30/2015   Procedure: LIGATION OF  ARTERIOVENOUS  FISTULA  LEFT ARM;  Surgeon: Fransisco HertzBrian L Chen, MD;  [facility];  Service: Vascular;  Laterality: Left;  . REVISON OF ARTERIOVENOUS FISTULA Left 08/28/2015   Procedure: REVISON OF ARTERIOVENOUS FISTULA;  Surgeon: Pryor OchoaJames D Lawson, MD;  [facility];  Service: Vascular;  Laterality: Left;  . TUBAL LIGATION      OB  History    No data available       Home Medications    Prior to Admission medications   Medication Sig Start Date End Date Taking? Authorizing Provider  albuterol (PROVENTIL) (2.5 MG/3ML) 0.083% nebulizer solution Take 3 mLs by nebulization daily as needed for wheezing or shortness of breath.  07/07/14  Yes [provider]  ASPIRIN LOW DOSE 81 MG EC tablet Take 81 mg by mouth daily.  08/02/15  Yes [provider]  donepezil (ARICEPT) 5 MG tablet Take 5 mg by mouth daily. 11/07/16  Yes [provider]  midodrine (PROAMATINE) 10 MG tablet Take 1 tablet (10 mg total) by mouth Every Tuesday,Thursday,and Saturday with dialysis. 11/17/16  Yes Lonia BloodMcClung, Jeffrey T, MD  mometasone (NASONEX) 50 MCG/ACT nasal spray Place 2 sprays into the nose at bedtime. 11/07/16  Yes [provider]  multivitamin (RENA-VIT) TABS tablet Take 1 tablet by mouth at bedtime.    Yes [provider]  Nutritional Supplements (FEEDING SUPPLEMENT, NEPRO CARB STEADY,) LIQD Take 237 mLs by mouth 2 (two) times daily between meals. Patient taking differently: Take 237 mLs by mouth Every Tuesday,Thursday,and Saturday with dialysis.  06/09/14  Yes Dellinger, Tora KindredMarianne L, PA-C  RENVELA 800 MG tablet Take 800-1,600 mg by mouth See admin instructions. 800 mg three times daily with meals and 1,600 mg with snacks 06/21/15  Yes [provider]  risperiDONE (RISPERDAL) 0.5 MG tablet Take 0.5 mg by mouth at bedtime. 11/07/16  Yes [provider]  SENSIPAR 60 MG tablet Take 60 mg by mouth daily.  07/02/15  Yes [provider]  albuterol (PROAIR HFA) 108 (90 Base) MCG/ACT inhaler Inhale 2 puffs into the lungs every 6 (six) hours. Patient not taking: Reported on 07/11/2017 11/16/16   Lonia BloodMcClung, Jeffrey T, MD  FLOVENT DISKUS 100 MCG/BLIST AEPB Inhale 2 puffs into the lungs daily. Patient not taking: Reported on 07/11/2017 11/16/16   Lonia BloodMcClung, Jeffrey T, MD  nitroGLYCERIN (NITROSTAT) 0.4 MG  SL tablet Place 0.4 mg under the tongue every 5 (five) minutes as needed for chest pain.    [provider]  predniSONE (DELTASONE) 20 MG tablet Take 40 mg by mouth daily for 3 days, then 20mg  by mouth daily for 3 days, then 10mg  daily for 3 days Patient not taking: Reported on 07/11/2017 11/16/16   Lonia BloodMcClung, Jeffrey T, MD    Family History Family History  Problem Relation Age of Onset  . Colon cancer Neg Hx   . Diabetes Mellitus II Neg Hx     Social History Social History   Tobacco Use  . Smoking status: Never Smoker  . Smokeless tobacco: Current User    Types: Snuff  Substance Use Topics  . Alcohol use: No    Alcohol/week: 0.0 oz  . Drug use: No     Allergies   Nsaids; Latex; and Tape   Review of Systems Review of Systems  Respiratory: Positive for cough, shortness of breath and wheezing.   Neurological: Positive for headaches.  All other systems reviewed and are negative.    Physical Exam Updated Vital Signs BP 105/69 (BP Location:  Left Arm)   Pulse (!) 109   Temp 98.4 F (36.9 C) (Oral)   Resp 19   Wt 61.2 kg (135 lb)   SpO2 97%   BMI 23.91 kg/m   Physical Exam  Constitutional: She is oriented to person, place, and time. She appears well-developed and well-nourished. No distress.  NAD.  HENT:  Head: Normocephalic and atraumatic.  Right Ear: External ear normal.  Left Ear: External ear normal.  Nose: Nose normal.  Adentuous. Moist mucous membranes. Oropharynx and tonsils normal.   Eyes: Conjunctivae and EOM are normal. No scleral icterus.  Neck: Normal range of motion. Neck supple.  No JVD.   Cardiovascular: Regular rhythm and normal heart sounds.  No murmur heard. HR 102-105. Intact radial and DP pulses bilaterally. Palpable thrill to RUE fistula. No lower extremity edema.   Pulmonary/Chest: Tachypnea noted. She has wheezes. She has rales. She exhibits no bony tenderness.  Diffuse rales and wheezes to all lung fields. Mild increased WOB. SpO2  >94 % on RA.   Abdominal: Soft. There is no tenderness.  Musculoskeletal: Normal range of motion. She exhibits no deformity.       Left lower leg: She exhibits no edema.  Neurological: She is alert and oriented to person, place, and time.  Skin: Skin is warm and dry. Capillary refill takes less than 2 seconds.  Psychiatric: She has a normal mood and affect. Her behavior is normal. Judgment and thought content normal.  Nursing note and vitals reviewed.    ED Treatments / Results  Labs (all labs ordered are listed, but only abnormal results are displayed) Labs Reviewed  COMPREHENSIVE METABOLIC PANEL - Abnormal; Notable for the following components:      Result Value   Chloride 98 (*)    Creatinine, Ser 6.12 (*)    Calcium 8.8 (*)    ALT 13 (*)    GFR calc non Af Amer 6 (*)    GFR calc Af Amer 7 (*)    All other components within normal limits  CBC WITH DIFFERENTIAL/PLATELET - Abnormal; Notable for the following components:   RBC 3.84 (*)    Hemoglobin 11.8 (*)    RDW 15.6 (*)    All other components within normal limits  I-STAT CG4 LACTIC ACID, ED - Abnormal; Notable for the following components:   Lactic Acid, Venous 4.53 (*)    All other components within normal limits  RESPIRATORY PANEL BY PCR  CULTURE, BLOOD (ROUTINE X 2)  CULTURE, BLOOD (ROUTINE X 2)  BRAIN NATRIURETIC PEPTIDE  URINALYSIS, ROUTINE W REFLEX MICROSCOPIC  I-STAT TROPONIN, ED  I-STAT CG4 LACTIC ACID, ED  I-STAT TROPONIN, ED    EKG  EKG Interpretation  Date/Time:  Tuesday July 11 2017 12:08:13 EST Ventricular Rate:  108 PR Interval:  146 QRS Duration: 92 QT Interval:  380 QTC Calculation: 509 R Axis:   -29 Text Interpretation:  Sinus tachycardia Low voltage QRS Incomplete right bundle branch block Abnormal ECG Artifact Poor data quality in current ECG precludes serial comparison Confirmed by Drema Pry [phone]) on 07/11/2017 4:22:25 PM       Radiology Dg Chest 2 View  Result Date:  07/11/2017 CLINICAL DATA:  Cough, short of breath for 1 week EXAM: CHEST  2 VIEW COMPARISON:  01/13/2017 FINDINGS: Moderate cardiomegaly. Normal vascularity. There is a new right suprahilar nodular density. Lungs are under aerated with bibasilar atelectasis for scar. No pneumothorax. No pleural effusion. IMPRESSION: New right suprahilar nodular density. Pulmonary nodule is not  excluded. CT may be helpful. Electronically Signed   By: Jolaine Click M.D.   On: 07/11/2017 13:33    Procedures Procedures (including critical care time)  CRITICAL CARE Performed by: Liberty Handy   Total critical care time: 35 minutes  Critical care time was exclusive of separately billable procedures and treating other patients.  Critical care was necessary to treat or prevent imminent or life-threatening deterioration.  Critical care was time spent personally by me on the following activities: development of treatment plan with patient and/or surrogate as well as nursing, discussions with consultants, evaluation of patient's response to treatment, examination of patient, obtaining history from patient or surrogate, ordering and performing treatments and interventions, ordering and review of laboratory studies, ordering and review of radiographic studies, pulse oximetry and re-evaluation of patient's condition.  Medications Ordered in ED Medications  ceFEPIme (MAXIPIME) 1 g in dextrose 5 % 50 mL IVPB (1 g Intravenous New Bag/Given 07/11/17 1701)  vancomycin (VANCOCIN) 1,500 mg in sodium chloride 0.9 % 500 mL IVPB (not administered)  sodium chloride 0.9 % bolus 1,000 mL (1,000 mLs Intravenous New Bag/Given 07/11/17 1701)    And  sodium chloride 0.9 % bolus 1,000 mL (not administered)    And  sodium chloride 0.9 % bolus 250 mL (not administered)  albuterol (PROVENTIL) (2.5 MG/3ML) 0.083% nebulizer solution 5 mg (5 mg Nebulization Given 07/11/17 1209)  ipratropium-albuterol (DUONEB) 0.5-2.5 (3) MG/3ML nebulizer solution  3 mL (3 mLs Nebulization Given 07/11/17 1556)  methylPREDNISolone sodium succinate (SOLU-MEDROL) 125 mg/2 mL injection 125 mg (125 mg Intravenous Given 07/11/17 1611)     Initial Impression / Assessment and Plan / ED Course  I have reviewed the triage vital signs and the nursing notes.  Pertinent labs & imaging results that were available during my care of the patient were reviewed by me and considered in my medical decision making (see chart for details).  Clinical Course as of Jul 11 1708  Tue Jul 11, 2017  1517 Lactic Acid, Venous: (!!) 4.53 [CG]  1518 Creatinine: (!) 6.12 [CG]  1518 Pulse Rate: (!) 102 [CG]  1518 BP: (!) 80/53 [CG]  1557 IMPRESSION: New right suprahilar nodular density. Pulmonary nodule is not excluded. CT may be helpful. DG Chest 2 View [CG]    Clinical Course User Index [CG] Liberty Handy, PA-C   84 year old female presents with shortness of breath and cough since yesterday. Had full dialysis session yesterday.  On exam she is non toxic, only slight increased in WOB. Mild tachycardia, tachypnea. No fever. Initial hypotension has improved. Diffuse rales and wheezing in all lung fields. No lower extremity edema or abdominal distention. No JVD. No orthopnea. Given initial hypotension, tachycardia and elevated lactic acid code sepsis initiated. Delay in IV access, patient is a difficult stick.  Final Clinical Impressions(s) / ED Diagnoses   Chest x-ray shows right new nodular density. Given clinical exam, symptoms suspicion is highest for infiltrate. She does have history of DVT but she has no pleuritic chest pain and has symptoms more consistent with infectious process. We'll consult inpatient team for admission for SIRS with likely respiratory etiology. Lactic acid > 4, we'll initiate 4cc/kg IVF and antibiotics for health acquired pneumonia. Pt has HD TTS, next session on 1/3.  Given IVF boluses in ED may require nephrology for earlier HD. Pt shared with supervising  physician who agrees with tx plan.   Final diagnoses:  None    ED Discharge Orders    None  Liberty Handy, PA-C 07/11/17 1742    Liberty Handy, PA-C 07/12/17 1738    Nira Conn, MD 07/15/17 [phone]

## 2017-07-11 NOTE — ED Notes (Signed)
Admitting MD at bedside. Awre that one iv and no other veins found. States to maintain current line.

## 2017-07-11 NOTE — ED Notes (Signed)
Dr. Waymon AmatoHongalgi at bedside and states to hold next saline of 1250cc total.

## 2017-07-11 NOTE — H&P (Signed)
History and Physical    [name] OZH:[id number] DOB: 1933/02/21 DOA: 07/11/2017  PCP: Nolene Ebbs, MD   I have briefly reviewed patients previous medical reports in [facility].  Patient coming from: Home  Chief Complaint: Cough, difficulty breathing and wheezing.  HPI: [name] is a 84 year old female with PMH of asthma, ESRD on TTS HD, anemia of chronic kidney disease, dementia with behavioral abnormalities, LLE DVT November 2015, hypotension, HLD, gout, lives at home with her daughter and granddaughter, ambulates mostly independently even though she has a cane at home, presented to the ED with above complaints. Patient is a poor historian due to her dementia and also not fully cooperative with history because she doesn't really want to be admitted but finally agreeable to stay overnight. History obtained from patient's granddaughter at bedside. Patient was in her usual state of health until she completed HD on day prior to admission (holiday schedule). After dialysis patient was noted to be lethargic, unable to open her eyes, more confused than her baseline, rattling cough, dyspnea, wheezing and went to bed early which is also unusual for her. No fever, chills or chest pain. She apparently stays cold all the time. No sick contacts at home. No recent long distance travel reported. She woke up around midnight with cough, her granddaughter gave her some Robitussin and then she went right back to sleep. She woke up this morning with no relief in her symptoms and hence was brought to the emergency department.  ED Course: Noted to be nontoxic, slight increased work of breathing, mild tachycardia and tachypnea without fever, transient hypotension that improved after IV fluid bolus, normal WBC, hemoglobin 11.8, creatinine 6.12, elevated lactate and chest x-ray showing new right suprahilar nodular density. Thus far patient received 1.5 L of IV fluids, her dose of IV vancomycin and  cefepime for suspected healthcare associated pneumonia, Solu-Medrol 125 MG. Patient feels better with improvement in her dyspnea and cough.  Review of Systems:  All other systems reviewed and apart from HPI, are negative.  Past Medical History:  Diagnosis Date  . Anemia   . Arthritis   . Asthma   . CKD (chronic kidney disease) stage V requiring chronic dialysis ([place])    T/TH/Sa  . Dementia   . DVT (deep venous thrombosis) (HCC)    LLE DVT 06/06/14  . Gout   . Hyperlipemia   . Hypertension     Past Surgical History:  Procedure Laterality Date  . AV FISTULA PLACEMENT    . AV FISTULA PLACEMENT Right 10/09/2015   Procedure: INSERTION OF ARTERIOVENOUS (AV) GORE-TEX GRAFT RIGHT UPPER ARM;  Surgeon: Mal Misty, MD;  Location: [place];  Service: Vascular;  Laterality: Right;  . INSERTION OF DIALYSIS CATHETER Right 08/30/2015   Procedure: INSERTION OF DIALYSIS CATHETER RIGHT INTERNAL JUGULAR ;  Surgeon: Conrad Foosland, MD;  Location: [place];  Service: Vascular;  Laterality: Right;  . IR GENERIC HISTORICAL  09/14/2016   IR US GUIDE VASC ACCESS RIGHT 09/14/2016 Sandi Mariscal, MD [facility]  . IR GENERIC HISTORICAL Right 09/14/2016   IR THROMBECTOMY AV FISTULA W/THROMBOLYSIS/PTA INC/SHUNT/IMG RIGHT 09/14/2016 Sandi Mariscal, MD [facility]  . LIGATION OF ARTERIOVENOUS  FISTULA Left 08/30/2015   Procedure: LIGATION OF ARTERIOVENOUS  FISTULA  LEFT ARM;  Surgeon: Conrad Kenvir, MD;  Location: [place];  Service: Vascular;  Laterality: Left;  . REVISON OF ARTERIOVENOUS FISTULA Left 08/28/2015   Procedure: REVISON OF ARTERIOVENOUS FISTULA;  Surgeon: Nelda Severe  Kellie Simmering, MD;  Location: [place];  Service: Vascular;  Laterality: Left;  . TUBAL LIGATION      Social History  reports that  has never smoked. Her smokeless tobacco use includes snuff. She reports that she does not drink alcohol or use drugs.  Allergies  Allergen Reactions  . Nsaids Other (See Comments)    REACTION: Avoid NSAIDS (renal failure)  . Latex  Hives and Swelling  . Tape Other (See Comments)    SKIN IS VERY THIN AND TEARS EASILY    Family History  Problem Relation Age of Onset  . Colon cancer Neg Hx   . Diabetes Mellitus II Neg Hx   Unable to obtain this history secondary to dementia and lack of patient cooperation.   Prior to Admission medications   Medication Sig Start Date End Date Taking? Authorizing Provider  albuterol (PROVENTIL) (2.5 MG/3ML) 0.083% nebulizer solution Take 3 mLs by nebulization daily as needed for wheezing or shortness of breath.  07/07/14  Yes [provider]  ASPIRIN LOW DOSE 81 MG EC tablet Take 81 mg by mouth daily.  08/02/15  Yes [provider]  donepezil (ARICEPT) 5 MG tablet Take 5 mg by mouth daily. 11/07/16  Yes [provider]  midodrine (PROAMATINE) 10 MG tablet Take 1 tablet (10 mg total) by mouth Every Tuesday,Thursday,and Saturday with dialysis. 11/17/16  Yes Cherene Altes, MD  mometasone (NASONEX) 50 MCG/ACT nasal spray Place 2 sprays into the nose at bedtime. 11/07/16  Yes [provider]  multivitamin (RENA-VIT) TABS tablet Take 1 tablet by mouth at bedtime.    Yes [provider]  Nutritional Supplements (FEEDING SUPPLEMENT, NEPRO CARB STEADY,) LIQD Take 237 mLs by mouth 2 (two) times daily between meals. Patient taking differently: Take 237 mLs by mouth Every Tuesday,Thursday,and Saturday with dialysis.  06/09/14  Yes Dellinger, Bobby Rumpf, PA-C  RENVELA 800 MG tablet Take 800-1,600 mg by mouth See admin instructions. 800 mg three times daily with meals and 1,600 mg with snacks 06/21/15  Yes [provider]  risperiDONE (RISPERDAL) 0.5 MG tablet Take 0.5 mg by mouth at bedtime. 11/07/16  Yes [provider]  SENSIPAR 60 MG tablet Take 60 mg by mouth daily.  07/02/15  Yes [provider]  albuterol (PROAIR HFA) 108 (90 Base) MCG/ACT inhaler Inhale 2 puffs into the lungs every 6 (six) hours. Patient not taking: Reported  on 07/11/2017 11/16/16   Cherene Altes, MD  FLOVENT DISKUS 100 MCG/BLIST AEPB Inhale 2 puffs into the lungs daily. Patient not taking: Reported on 07/11/2017 11/16/16   Cherene Altes, MD  nitroGLYCERIN (NITROSTAT) 0.4 MG SL tablet Place 0.4 mg under the tongue every 5 (five) minutes as needed for chest pain.    [provider]  predniSONE (DELTASONE) 20 MG tablet Take 40 mg by mouth daily for 3 days, then 43m by mouth daily for 3 days, then 186mdaily for 3 days Patient not taking: Reported on 07/11/2017 11/16/16   McCherene AltesMD    Physical Exam: Vitals:   07/11/17 1705 07/11/17 1707 07/11/17 1730 07/11/17 1800  BP: 105/69  120/79 138/78  Pulse: (!) 109   91  Resp: 19  19 (!) 30  Temp:      TempSrc:      SpO2: 97%   100%  Weight:  61.2 kg (135 lb)        Constitutional: Elderly female, small built and thinly nourished, lying propped up in bed in  no obvious distress at this time. Eyes: PERTLA, lids and conjunctivae normal. Bilateral arcus senilis. Bilateral intraocular lens +. ENMT: Mucous membranes are moist. Posterior pharynx clear of any exudate or lesions. Edentulous.  Neck: supple, no masses, no thyromegaly Respiratory: Reduced breath sounds bilaterally. Harsh breath sounds with scattered bilateral few medium pitched expiratory rhonchi but no crackles. No stridor. No active accessory muscles. Mild increased work of breathing. Cardiovascular: S1 & S2 heard, regular rate and rhythm, no murmurs / rubs / gallops. Trace bilateral ankle edema. 2+ pedal pulses. No carotid bruits.  Abdomen: No distension, no tenderness, no masses palpated. No hepatosplenomegaly. Bowel sounds normal.  Musculoskeletal: no clubbing / cyanosis. No joint deformity upper and lower extremities. Good ROM, no contractures. Normal muscle tone.  Skin: no rashes, lesions, ulcers. No induration Neurologic: CN 2-12 grossly intact. Sensation intact, DTR normal. Strength 5/5 in all 4 limbs.  Psychiatric:  Impaired judgment and insight. Alert and oriented x 2. Mood is irritable.  Labs on Admission: I have personally reviewed following labs and imaging studies  CBC: Recent Labs  Lab 07/11/17 1212  WBC 7.6  NEUTROABS 3.9  HGB 11.8*  HCT 37.2  MCV 96.9  PLT 110   Basic Metabolic Panel: Recent Labs  Lab 07/11/17 1212  NA 136  K 4.0  CL 98*  CO2 23  GLUCOSE 84  BUN 15  CREATININE 6.12*  CALCIUM 8.8*   Liver Function Tests: Recent Labs  Lab 07/11/17 1212  AST 32  ALT 13*  ALKPHOS 111  BILITOT 0.8  PROT 7.4  ALBUMIN 3.7      Radiological Exams on Admission: Dg Chest 2 View  Result Date: 07/11/2017 CLINICAL DATA:  Cough, short of breath for 1 week EXAM: CHEST  2 VIEW COMPARISON:  01/13/2017 FINDINGS: Moderate cardiomegaly. Normal vascularity. There is a new right suprahilar nodular density. Lungs are under aerated with bibasilar atelectasis for scar. No pneumothorax. No pleural effusion. IMPRESSION: New right suprahilar nodular density. Pulmonary nodule is not excluded. CT may be helpful. Electronically Signed   By: Marybelle Killings M.D.   On: 07/11/2017 13:33    EKG: Independently reviewed. Poor EKG with tremor artifacts. Sinus tachycardia at 108 bpm,? LAD, incomplete RBBB (not new),? Mild ST depression/T-wave inversion in inferior leads.  Assessment/Plan Principal Problem:   Asthma, chronic, unspecified asthma severity, with acute exacerbation Active Problems:   Essential hypertension   GERD   ESRD on dialysis (HCC)   Hypotension   Dementia with behavioral disturbance   Protein-calorie malnutrition, severe (HCC)   Sepsis ([place])   Anemia of renal disease   HCAP (healthcare-associated pneumonia)   Acute bronchitis with asthma     1. Acute Asthma exacerbation: Likely precipitated by acute viral bronchitis versus healthcare associated pneumonia. Pulmonary edema and PE felt less likely. Continue IV vancomycin and cefepime initiated in ED. Bronchodilator nebulizations.  IV Solu-Medrol 60 mg every 8 hourly. Monitor closely. 2. Possible healthcare associated pneumonia: Chest x-ray shows new right suprahilar nodular density. No symptoms to suggest aspiration. Index of suspicion however is low due to lack of productive cough, fever, leukocytosis. Continue IV cefepime and vancomycin. Follow repeat chest x-ray in a.m. to see if pneumonia declares itself. 3. Acute viral bronchitis: Seems more likely. Droplet isolation. Follow RSV panel sent from ED. Supportive treatment and rest as above. 4. Sepsis Vs SIRS: Patient met SIRS physiology in ED. She has already received 1.5 L IV fluids, will not continue weight-based IV fluid resuscitation due to concern for volume overload  in this dialysis patient. Gentle and brief IV fluids, antibiotics as above. 5. Elevated lactate: Likely secondary to respiratory distress and transient hypotension with poor tissue perfusion. Brief IV fluids. Follow lactate in a.m. 6. Acute on Chronic hypotension: Patient takes midodrine at home, continue. Was transiently hypotensive (85/53) in ED which has since resolved. Monitor closely. 7. Dementia with behavioral abnormalities: Continue risperidone and Aricept. At risk for acute confusion due to acute illness, steroids, hospitalization. Delirium precautions. 8. ESRD on TTS HD: Does not appear volume overloaded. BNP 96.7. Underwent full cycle of HD off schedule (holiday schedule) on day prior to admission. Consult nephrology in a.m. for dialysis needs. 9. Anemia of chronic kidney disease: Stable.   DVT prophylaxis: Subcutaneous heparin  Code Status: Full, confirmed with granddaughter at bedside.  Family Communication: Discussed in detail with patient's granddaughter at bedside. Updated care and answered questions.  Disposition Plan: DC home when medically improved.  Consults called: Call nephrology in a.m.  Admission status: Observation, telemetry.   Severity of Illness: The appropriate patient status  for this patient is OBSERVATION. Observation status is judged to be reasonable and necessary in order to provide the required intensity of service to ensure the patient's safety. The patient's presenting symptoms, physical exam findings, and initial radiographic and laboratory data in the context of their medical condition is felt to place them at decreased risk for further clinical deterioration. Furthermore, it is anticipated that the patient will be medically stable for discharge from the hospital within 2 midnights of admission. The following factors support the patient status of observation.   " The patient's presenting symptoms include cough, wheezing, difficulty breathing. " The physical exam findings include transient hypotension, mild tachycardia, respiratory system with reduced breath sounds and wheezing. " The initial radiographic and laboratory data are hemoglobin 11, creatinine 6.12, lactate 4.53 > 3.64, POC troponins 2 negative, chest x-ray shows new right suprahilar nodular density    Vernell Leep MD [facility] Pager [phone]  If 7PM-7AM, please contact night-coverage [email or web address] Password [facility]  07/11/2017, 6:27 PM

## 2017-07-12 ENCOUNTER — Observation Stay (HOSPITAL_COMMUNITY): Payer: Medicare Other

## 2017-07-12 DIAGNOSIS — F0391 Unspecified dementia with behavioral disturbance: Secondary | ICD-10-CM | POA: Diagnosis present

## 2017-07-12 DIAGNOSIS — B338 Other specified viral diseases: Secondary | ICD-10-CM | POA: Diagnosis present

## 2017-07-12 DIAGNOSIS — J205 Acute bronchitis due to respiratory syncytial virus: Secondary | ICD-10-CM | POA: Diagnosis present

## 2017-07-12 DIAGNOSIS — Z91048 Other nonmedicinal substance allergy status: Secondary | ICD-10-CM | POA: Diagnosis not present

## 2017-07-12 DIAGNOSIS — J45901 Unspecified asthma with (acute) exacerbation: Secondary | ICD-10-CM | POA: Diagnosis present

## 2017-07-12 DIAGNOSIS — E43 Unspecified severe protein-calorie malnutrition: Secondary | ICD-10-CM | POA: Diagnosis present

## 2017-07-12 DIAGNOSIS — E8889 Other specified metabolic disorders: Secondary | ICD-10-CM | POA: Diagnosis present

## 2017-07-12 DIAGNOSIS — Z79899 Other long term (current) drug therapy: Secondary | ICD-10-CM | POA: Diagnosis not present

## 2017-07-12 DIAGNOSIS — Z7982 Long term (current) use of aspirin: Secondary | ICD-10-CM | POA: Diagnosis not present

## 2017-07-12 DIAGNOSIS — B974 Respiratory syncytial virus as the cause of diseases classified elsewhere: Secondary | ICD-10-CM

## 2017-07-12 DIAGNOSIS — N2581 Secondary hyperparathyroidism of renal origin: Secondary | ICD-10-CM | POA: Diagnosis present

## 2017-07-12 DIAGNOSIS — I9589 Other hypotension: Secondary | ICD-10-CM | POA: Diagnosis present

## 2017-07-12 DIAGNOSIS — Z6830 Body mass index (BMI) 30.0-30.9, adult: Secondary | ICD-10-CM | POA: Diagnosis not present

## 2017-07-12 DIAGNOSIS — K219 Gastro-esophageal reflux disease without esophagitis: Secondary | ICD-10-CM | POA: Diagnosis present

## 2017-07-12 DIAGNOSIS — E785 Hyperlipidemia, unspecified: Secondary | ICD-10-CM | POA: Diagnosis present

## 2017-07-12 DIAGNOSIS — D631 Anemia in chronic kidney disease: Secondary | ICD-10-CM | POA: Diagnosis present

## 2017-07-12 DIAGNOSIS — I120 Hypertensive chronic kidney disease with stage 5 chronic kidney disease or end stage renal disease: Secondary | ICD-10-CM | POA: Diagnosis present

## 2017-07-12 DIAGNOSIS — Z91040 Latex allergy status: Secondary | ICD-10-CM | POA: Diagnosis not present

## 2017-07-12 DIAGNOSIS — Z86718 Personal history of other venous thrombosis and embolism: Secondary | ICD-10-CM | POA: Diagnosis not present

## 2017-07-12 DIAGNOSIS — Z992 Dependence on renal dialysis: Secondary | ICD-10-CM | POA: Diagnosis not present

## 2017-07-12 DIAGNOSIS — N186 End stage renal disease: Secondary | ICD-10-CM | POA: Diagnosis present

## 2017-07-12 DIAGNOSIS — Z886 Allergy status to analgesic agent status: Secondary | ICD-10-CM | POA: Diagnosis not present

## 2017-07-12 LAB — RESPIRATORY PANEL BY PCR
ADENOVIRUS-RVPPCR: NOT DETECTED
BORDETELLA PERTUSSIS-RVPCR: NOT DETECTED
CHLAMYDOPHILA PNEUMONIAE-RVPPCR: NOT DETECTED
CORONAVIRUS 229E-RVPPCR: NOT DETECTED
CORONAVIRUS HKU1-RVPPCR: NOT DETECTED
CORONAVIRUS NL63-RVPPCR: NOT DETECTED
Coronavirus OC43: NOT DETECTED
Influenza A: NOT DETECTED
Influenza B: NOT DETECTED
Metapneumovirus: NOT DETECTED
Mycoplasma pneumoniae: NOT DETECTED
Parainfluenza Virus 1: NOT DETECTED
Parainfluenza Virus 2: NOT DETECTED
Parainfluenza Virus 3: NOT DETECTED
Parainfluenza Virus 4: NOT DETECTED
Respiratory Syncytial Virus: DETECTED — AB
Rhinovirus / Enterovirus: NOT DETECTED

## 2017-07-12 LAB — LACTIC ACID, PLASMA: LACTIC ACID, VENOUS: 2.5 mmol/L — AB (ref 0.5–1.9)

## 2017-07-12 LAB — MRSA PCR SCREENING: MRSA by PCR: NEGATIVE

## 2017-07-12 NOTE — Progress Notes (Signed)
PROGRESS NOTE    [name]  FAO:[id number] DOB: [birth year] DOA: 07/11/2017 PCP: Fleet Contras, MD   Outpatient Specialists:     Brief Narrative:  [name] is a 84 year old female with PMH of asthma, ESRD on TTS HD, anemia of chronic kidney disease, dementia with behavioral abnormalities, LLE DVT November 2015, hypotension, HLD, gout, lives at home with her daughter and granddaughter, ambulates mostly independently even though she has a cane at home, presented to the ED with above complaints. Patient is a poor historian due to her dementia and also not fully cooperative with history because she doesn't really want to be admitted but finally agreeable to stay overnight. History obtained from patient's granddaughter at bedside. Patient was in her usual state of health until she completed HD on day prior to admission (holiday schedule). After dialysis patient was noted to be lethargic, unable to open her eyes, more confused than her baseline, rattling cough, dyspnea, wheezing and went to bed early which is also unusual for her. No fever, chills or chest pain. She apparently stays cold all the time. No sick contacts at home. No recent long distance travel reported. She woke up around midnight with cough, her granddaughter gave her some Robitussin and then she went right back to sleep. She woke up this morning with no relief in her symptoms and hence was brought to the emergency department.     Assessment & Plan:   Principal Problem:   Asthma, chronic, unspecified asthma severity, with acute exacerbation Active Problems:   Essential hypertension   GERD   ESRD on dialysis (HCC)   Hypotension   Dementia with behavioral disturbance   Protein-calorie malnutrition, severe (HCC)   Sepsis (HCC)   Anemia of renal disease   HCAP (healthcare-associated pneumonia)   Acute bronchitis with asthma   Acute Asthma exacerbation due to RSV -steroids IV -nebs -d/c IV abx -supportive  treatment -home O2 eval  Sinus tachy -due to breathing issues  Elevated lactate: Likely secondary to respiratory distress and transient hypotension with poor tissue perfusion. -repeat lactic acid now  Acute on Chronic hypotension: Patient takes midodrine at home, continue. Was transiently hypotensive (85/53) in ED which has since resolved  Dementia with behavioral abnormalities: Continue risperidone and Aricept.  -at baseline per family  ESRD on TTS HD:  -nephrology consulted  Anemia of chronic kidney disease: Stable.      DVT prophylaxis:  SQ Heparin  Code Status: Full Code   Family Communication: At bedside  Disposition Plan:     Consultants:    Subjective: In chair-- no current complaints, talking with family  Objective: Vitals:   07/11/17 2215 07/12/17 0117 07/12/17 0535 07/12/17 0939  BP: (!) 108/56  118/61   Pulse: 100  (!) 102   Resp: 16  16   Temp: 98 F (36.7 C)  97.8 F (36.6 C)   TempSrc: Oral  Oral   SpO2: 98% 98% 97% 96%  Weight:       No intake or output data in the 24 hours ending 07/12/17 1258 Filed Weights   07/11/17 1707 07/11/17 1853  Weight: 61.2 kg (135 lb) 70.5 kg (155 lb 6.8 oz)    Examination:  General exam: no increase work of breathing Respiratory system: wheezing Cardiovascular system: sinus tachy to 120s. Gastrointestinal system: Abdomen is nondistended, soft and nontender. No organomegaly or masses felt. Normal bowel sounds heard. Central nervous system: Alert -- pleasantly confused Extremities: Symmetric 5 x 5 power.  Data Reviewed: I have personally reviewed following labs and imaging studies  CBC: Recent Labs  Lab 07/11/17 1212  WBC 7.6  NEUTROABS 3.9  HGB 11.8*  HCT 37.2  MCV 96.9  PLT 202   Basic Metabolic Panel: Recent Labs  Lab 07/11/17 1212  NA 136  K 4.0  CL 98*  CO2 23  GLUCOSE 84  BUN 15  CREATININE 6.12*  CALCIUM 8.8*   GFR: CrCl cannot be calculated (Unknown ideal  weight.). Liver Function Tests: Recent Labs  Lab 07/11/17 1212  AST 32  ALT 13*  ALKPHOS 111  BILITOT 0.8  PROT 7.4  ALBUMIN 3.7   No results for input(s): LIPASE, AMYLASE in the last 168 hours. No results for input(s): AMMONIA in the last 168 hours. Coagulation Profile: No results for input(s): INR, PROTIME in the last 168 hours. Cardiac Enzymes: No results for input(s): CKTOTAL, CKMB, CKMBINDEX, TROPONINI in the last 168 hours. BNP (last 3 results) No results for input(s): PROBNP in the last 8760 hours. HbA1C: No results for input(s): HGBA1C in the last 72 hours. CBG: No results for input(s): GLUCAP in the last 168 hours. Lipid Profile: No results for input(s): CHOL, HDL, LDLCALC, TRIG, CHOLHDL, LDLDIRECT in the last 72 hours. Thyroid Function Tests: No results for input(s): TSH, T4TOTAL, FREET4, T3FREE, THYROIDAB in the last 72 hours. Anemia Panel: No results for input(s): VITAMINB12, FOLATE, FERRITIN, TIBC, IRON, RETICCTPCT in the last 72 hours. Urine analysis:    Component Value Date/Time   COLORURINE YELLOW 07/02/2014 1828   APPEARANCEUR CLOUDY (A) 07/02/2014 1828   LABSPEC 1.013 07/02/2014 1828   PHURINE 8.0 07/02/2014 1828   GLUCOSEU NEGATIVE 07/02/2014 1828   HGBUR MODERATE (A) 07/02/2014 1828   BILIRUBINUR NEGATIVE 07/02/2014 1828   KETONESUR NEGATIVE 07/02/2014 1828   PROTEINUR 100 (A) 07/02/2014 1828   UROBILINOGEN 1.0 07/02/2014 1828   NITRITE NEGATIVE 07/02/2014 1828   LEUKOCYTESUR MODERATE (A) 07/02/2014 1828     ) Recent Results (from the past 240 hour(s))  Respiratory Panel by PCR     Status: Abnormal   Collection Time: 07/11/17  4:06 PM  Result Value Ref Range Status   Adenovirus NOT DETECTED NOT DETECTED Final   Coronavirus 229E NOT DETECTED NOT DETECTED Final   Coronavirus HKU1 NOT DETECTED NOT DETECTED Final   Coronavirus NL63 NOT DETECTED NOT DETECTED Final   Coronavirus OC43 NOT DETECTED NOT DETECTED Final   Metapneumovirus NOT DETECTED  NOT DETECTED Final   Rhinovirus / Enterovirus NOT DETECTED NOT DETECTED Final   Influenza A NOT DETECTED NOT DETECTED Final   Influenza B NOT DETECTED NOT DETECTED Final   Parainfluenza Virus 1 NOT DETECTED NOT DETECTED Final   Parainfluenza Virus 2 NOT DETECTED NOT DETECTED Final   Parainfluenza Virus 3 NOT DETECTED NOT DETECTED Final   Parainfluenza Virus 4 NOT DETECTED NOT DETECTED Final   Respiratory Syncytial Virus DETECTED (A) NOT DETECTED Final    Comment: CRITICAL RESULT CALLED TO, READ BACK BY AND VERIFIED WITH: Laurence Spates RN 9:55 07/12/17 ([email or web address])    Bordetella pertussis NOT DETECTED NOT DETECTED Final   Chlamydophila pneumoniae NOT DETECTED NOT DETECTED Final   Mycoplasma pneumoniae NOT DETECTED NOT DETECTED Final  MRSA PCR Screening     Status: None   Collection Time: 07/11/17 11:26 PM  Result Value Ref Range Status   MRSA by PCR NEGATIVE NEGATIVE Final    Comment:        The GeneXpert MRSA Assay (FDA approved for NASAL specimens only),  is one component of a comprehensive MRSA colonization surveillance program. It is not intended to diagnose MRSA infection nor to guide or monitor treatment for MRSA infections.       Anti-infectives (From admission, onward)   Start     Dose/Rate Route Frequency Ordered Stop   07/12/17 1700  ceFEPIme (MAXIPIME) 1 g in dextrose 5 % 50 mL IVPB  Status:  Discontinued     1 g 100 mL/hr over 30 Minutes Intravenous Every 24 hours 07/11/17 1904 07/12/17 1255   07/11/17 1630  vancomycin (VANCOCIN) 1,500 mg in sodium chloride 0.9 % 500 mL IVPB     1,500 mg 250 mL/hr over 120 Minutes Intravenous  Once 07/11/17 1611 07/11/17 1942   07/11/17 1600  ceFEPIme (MAXIPIME) 1 g in dextrose 5 % 50 mL IVPB     1 g 100 mL/hr over 30 Minutes Intravenous  Once 07/11/17 1557 07/11/17 1734       Radiology Studies: X-ray Chest Pa And Lateral  Result Date: 07/12/2017 CLINICAL DATA:  Pneumonia EXAM: CHEST  2 VIEW COMPARISON:  07/11/2017 FINDINGS:  Cardiac shadow remains enlarged. Lungs are well aerated bilaterally. The previously seen nodular density is not as well per projected on the current exam although a lead is noted in this region. Small effusions are noted bilaterally. IMPRESSION: Previously seen nodule not as well appreciated although a telemetry lead is noted in the expected region. Again CT may be helpful for further evaluation as clinically indicated. Small bilateral pleural effusions. Electronically Signed   By: Alcide CleverMark  Lukens M.D.   On: 07/12/2017 07:22   Dg Chest 2 View  Result Date: 07/11/2017 CLINICAL DATA:  Cough, short of breath for 1 week EXAM: CHEST  2 VIEW COMPARISON:  01/13/2017 FINDINGS: Moderate cardiomegaly. Normal vascularity. There is a new right suprahilar nodular density. Lungs are under aerated with bibasilar atelectasis for scar. No pneumothorax. No pleural effusion. IMPRESSION: New right suprahilar nodular density. Pulmonary nodule is not excluded. CT may be helpful. Electronically Signed   By: Jolaine ClickArthur  Hoss M.D.   On: 07/11/2017 13:33        Scheduled Meds: . aspirin EC  81 mg Oral Daily  . cinacalcet  60 mg Oral Daily  . donepezil  5 mg Oral Daily  . [START ON 07/13/2017] feeding supplement (NEPRO CARB STEADY)  237 mL Oral Q T,Th,Sa-HD  . fluticasone  2 spray Each Nare Daily  . guaiFENesin  600 mg Oral BID  . heparin  5,000 Units Subcutaneous Q8H  . ipratropium-albuterol  3 mL Nebulization Q6H  . methylPREDNISolone (SOLU-MEDROL) injection  60 mg Intravenous Q8H  . [START ON 07/13/2017] midodrine  10 mg Oral Q T,Th,Sa-HD  . multivitamin  1 tablet Oral QHS  . risperiDONE  0.5 mg Oral QHS  . sevelamer carbonate  800 mg Oral TID WC  . sodium chloride flush  3 mL Intravenous Q12H   Continuous Infusions:   LOS: 0 days    Time spent: 35 min    Joseph ArtJessica U Carrine Kroboth, DO [facility] Pager [phone]  If 7PM-7AM, please contact night-coverage [email or web address] Password [email or web address] 07/12/2017, 12:58 PM

## 2017-07-12 NOTE — Progress Notes (Signed)
CRITICAL VALUE ALERT  Critical Value:  Lactic Acid 2.5  Date & Time Notied:  1545 07/12/2017  Provider Notified: Benjamine MolaVann, MD  Orders Received/Actions taken:

## 2017-07-12 NOTE — Evaluation (Signed)
Physical Therapy Evaluation Patient Details Name: [name] MRN: [id number] DOB: [birth year] Today's Date: 07/12/2017   History of Present Illness  [name] is a 84 year old female with PMH of asthma, ESRD on TTS HD, anemia of chronic kidney disease, dementia with behavioral abnormalities, LLE DVT November 2015, hypotension, HLD, gout, lives at home with her daughter and granddaughter, ambulates mostly independently even though she has a cane at home, presented to the ED with cough, difficulty breathing.  Clinical Impression   Pt admitted with above diagnosis. Pt currently with functional limitations due to the deficits listed below (see PT Problem List). Unable to work on progressive amb as pt declined mask to was outside of room; Still, dc home with familiar caregivers, routine, and environment tends to be the most therapeutic situation for pts with dementia;  Pt will benefit from skilled PT to increase their independence and safety with mobility to allow discharge to the venue listed below.       Follow Up Recommendations Home health PT;Supervision/Assistance - 24 hour    Equipment Recommendations  None recommended by PT    Recommendations for Other Services       Precautions / Restrictions Precautions Precautions: Fall Precaution Comments: Droplet Restrictions Weight Bearing Restrictions: No      Mobility  Bed Mobility               General bed mobility comments: EOB upon entry  Transfers Overall transfer level: Needs assistance Equipment used: None Transfers: Sit to/from Stand Sit to Stand: Min guard         General transfer comment: Minguard for safety; declined offered handheld assist  Ambulation/Gait Ambulation/Gait assistance: Min guard Ambulation Distance (Feet): 20 Feet Assistive device: None Gait Pattern/deviations: Step-through pattern     General Gait Details: Tending to reach out for UE support on furniture with in-room walking;  declined handheld assist  Stairs            Wheelchair Mobility    Modified Rankin (Stroke Patients Only)       Balance Overall balance assessment: Needs assistance           Standing balance-Leahy Scale: Poor(reaches out for UE support)                               Pertinent Vitals/Pain Pain Assessment: Faces Faces Pain Scale: Hurts a little bit Pain Location: Rubbing L shoulder with slight grimace after walking in room Pain Descriptors / Indicators: Grimacing Pain Intervention(s): Monitored during session    Home Living Family/patient expects to be discharged to:: Private residence Living Arrangements: Children Available Help at Discharge: Family;Available 24 hours/day Type of Home: House Home Access: Stairs to enter Entrance Stairs-Rails: Doctor, general practice of Steps: 2 Home Layout: One level Home Equipment: Cane - single point;Tub bench;Walker - 2 wheels Additional Comments: lives with dtr and grand dtr     Prior Function Level of Independence: Needs assistance   Gait / Transfers Assistance Needed: Pt ambulates with SPC, but doesn't always remember to use it.  Daughter denies falls   ADL's / Homemaking Assistance Needed: Has bath aide daily  Comments: Some of the above info gleaned from previous admission May of last year     Hand Dominance   Dominant Hand: Right    Extremity/Trunk Assessment   Upper Extremity Assessment Upper Extremity Assessment: Overall WFL for tasks assessed    Lower Extremity Assessment Lower Extremity Assessment:  Generalized weakness       Communication   Communication: Other (comment)(rambling conversation )  Cognition Arousal/Alertness: Awake/alert Behavior During Therapy: WFL for tasks assessed/performed Overall Cognitive Status: History of cognitive impairments - at baseline                                 General Comments: Family member present, not forthcoming with  information when asked questions about PLOF and home setup      General Comments General comments (skin integrity, edema, etc.): Declined hallway amb -- refusing mask for going out of room    Exercises     Assessment/Plan    PT Assessment Patient needs continued PT services  PT Problem List Decreased strength;Decreased activity tolerance;Decreased balance;Decreased cognition;Decreased knowledge of use of DME;Decreased safety awareness       PT Treatment Interventions DME instruction;Gait training;Stair training;Functional mobility training;Therapeutic activities;Therapeutic exercise;Balance training;Cognitive remediation;Neuromuscular re-education;Patient/family education    PT Goals (Current goals can be found in the Care Plan section)  Acute Rehab PT Goals Patient Stated Goal: agreeable to OOB to chair PT Goal Formulation: Patient unable to participate in goal setting Time For Goal Achievement: 07/26/17 Potential to Achieve Goals: Good    Frequency Min 3X/week   Barriers to discharge        Co-evaluation               AM-PAC PT "6 Clicks" Daily Activity  Outcome Measure Difficulty turning over in bed (including adjusting bedclothes, sheets and blankets)?: None Difficulty moving from lying on back to sitting on the side of the bed? : None Difficulty sitting down on and standing up from a chair with arms (e.g., wheelchair, bedside commode, etc,.)?: A Little Help needed moving to and from a bed to chair (including a wheelchair)?: A Little Help needed walking in hospital room?: A Little Help needed climbing 3-5 steps with a railing? : A Little 6 Click Score: 20    End of Session   Activity Tolerance: Patient tolerated treatment well Patient left: in chair;with call bell/phone within reach;with chair alarm set;with family/visitor present Nurse Communication: Mobility status PT Visit Diagnosis: Unsteadiness on feet (R26.81)    Time: [id number] PT Time Calculation  (min) (ACUTE ONLY): 20 min   Charges:   PT Evaluation $PT Eval Low Complexity: 1 Low     PT G Codes:        Van ClinesHolly Jerzie Bieri, PT  Acute Rehabilitation Services Pager [phone] Office [phone]   Levi AlandHolly H Pernella Ackerley 07/12/2017, 10:21 AM

## 2017-07-12 NOTE — Consult Note (Signed)
[facility] Renal Consultation Note    Indication for Consultation:  Management of ESRD/hemodialysis, anemia, hypertension/volume, and secondary hyperparathyroidism. PCP:  HPI: [name] is a 84 y.o. female with ESRD, hypotension (on midodrine), dementia, and asthma who was admitted with dyspnea.   Pt is poor historian due to her dementia. Per notes, came to ED yesterday with increased cough. Apparently, had been feeling lethargic and coughing for the past several days. In ED, found to be tachycardic and hypoxic. Labs with K 4, WBC 7.6, Hgb 11.8, lactic acid 4.5, trop 0.01. CXR showed possible R HCAP.  At this time, she looks comfortable. No CP or dyspnea, still coughing a bit. Denies N/V or diarrhea.  From renal standpoint, she dialyzes TTS at [facility] (last HD Monday per holiday schedule). Her next HD is due tomorrow. No issues with her dialysis AVG.  Past Medical History:  Diagnosis Date  . Anemia, chronic disease    /notes 07/11/2017  . Arthritis   . Asthma   . Dementia   . DVT (deep venous thrombosis) (HCC)    LLE DVT 06/06/14  . ESRD (end stage renal disease) on dialysis (HCC)    "TTS; [facility]" (07/11/2017)  . Gout   . Hyperlipemia   . Hypertension    Past Surgical History:  Procedure Laterality Date  . AV FISTULA PLACEMENT  ~ 2010  . AV FISTULA PLACEMENT Right 10/09/2015   Procedure: INSERTION OF ARTERIOVENOUS (AV) GORE-TEX GRAFT RIGHT UPPER ARM;  Surgeon: Pryor Ochoa, MD;  Location[facility] OR;  Service: Vascular;  Laterality: Right;  . CATARACT EXTRACTION W/ INTRAOCULAR LENS  IMPLANT, BILATERAL Bilateral   . INSERTION OF DIALYSIS CATHETER Right 08/30/2015   Procedure: INSERTION OF DIALYSIS CATHETER RIGHT INTERNAL JUGULAR ;  Surgeon: Fransisco Hertz, MD;  Location: [facility] OR;  Service: Vascular;  Laterality: Right;  . IR GENERIC HISTORICAL  09/14/2016   IR US GUIDE VASC ACCESS RIGHT 09/14/2016 [name], MD [facility]-INTER[facility] RAD  . IR GENERIC HISTORICAL Right 09/14/2016   IR  THROMBECTOMY AV FISTULA W/THROMBOLYSIS/PTA INC/SHUNT/IMG RIGHT 09/14/2016 [name], MD [facility]INTER[facility]  . LIGATION OF ARTERIOVENOUS  FISTULA Left 08/30/2015   Procedure: LIGATION OF ARTERIOVENOUS  FISTULA  LEFT ARM;  Surgeon: Fransisco Hertz, MD;  Location: [facility] OR;  Service: Vascular;  Laterality: Left;  . REVISON OF ARTERIOVENOUS FISTULA Left 08/28/2015   Procedure: REVISON OF ARTERIOVENOUS FISTULA;  Surgeon: Pryor Ochoa, MD;  Location: [facility] OR;  Service: Vascular;  Laterality: Left;  . TUBAL LIGATION     Family History  Problem Relation Age of Onset  . Colon cancer Neg Hx   . Diabetes Mellitus II Neg Hx    Social History:  reports that  has never smoked. Her smokeless tobacco use includes snuff. She reports that she does not drink alcohol or use drugs.  ROS: As per HPI otherwise negative.  Physical Exam: Vitals:   07/11/17 2215 07/12/17 0117 07/12/17 0535 07/12/17 0939  BP: (!) 108/56  118/61   Pulse: 100  (!) 102   Resp: 16  16   Temp: 98 F (36.7 C)  97.8 F (36.6 C)   TempSrc: Oral  Oral   SpO2: 98% 98% 97% 96%  Weight:         General: Elderly female, in no acute distress. Head: Normocephalic, atraumatic, sclera non-icteric, mucus membranes are moist. Neck: Supple without lymphadenopathy/masses. JVD not elevated. Lungs: Intermittent wheezing throughout, no rales. Heart: RRR, 3/6 systolic murmur noted. Abdomen: Soft,  non-tender, non-distended with normoactive bowel sounds. No rebound/guarding. No obvious abdominal masses. Musculoskeletal:  Strength and tone appear normal for age. Lower extremities: No edema or ischemic changes, no open wounds. Neuro: Alert and oriented X 3. Moves all extremities spontaneously. Psych:  Responds to questions appropriately with a normal affect. Dialysis Access: AVG + bruit  Allergies  Allergen Reactions  . Nsaids Other (See Comments)    REACTION: Avoid NSAIDS (renal failure)  . Latex Hives and Swelling  . Tape Other (See Comments)    SKIN  IS VERY THIN AND TEARS EASILY   Prior to Admission medications   Medication Sig Start Date End Date Taking? Authorizing Provider  albuterol (PROVENTIL) (2.5 MG/3ML) 0.083% nebulizer solution Take 3 mLs by nebulization daily as needed for wheezing or shortness of breath.  07/07/14  Yes [provider]  ASPIRIN LOW DOSE 81 MG EC tablet Take 81 mg by mouth daily.  08/02/15  Yes [provider]  donepezil (ARICEPT) 5 MG tablet Take 5 mg by mouth daily. 11/07/16  Yes [provider]  midodrine (PROAMATINE) 10 MG tablet Take 1 tablet (10 mg total) by mouth Every Tuesday,Thursday,and Saturday with dialysis. 11/17/16  Yes Lonia BloodMcClung, Jeffrey T, MD  mometasone (NASONEX) 50 MCG/ACT nasal spray Place 2 sprays into the nose at bedtime. 11/07/16  Yes [provider]  multivitamin (RENA-VIT) TABS tablet Take 1 tablet by mouth at bedtime.    Yes [provider]  Nutritional Supplements (FEEDING SUPPLEMENT, NEPRO CARB STEADY,) LIQD Take 237 mLs by mouth 2 (two) times daily between meals. Patient taking differently: Take 237 mLs by mouth Every Tuesday,Thursday,and Saturday with dialysis.  06/09/14  Yes Dellinger, Tora KindredMarianne L, PA-C  RENVELA 800 MG tablet Take 800-1,600 mg by mouth See admin instructions. 800 mg three times daily with meals and 1,600 mg with snacks 06/21/15  Yes [provider]  risperiDONE (RISPERDAL) 0.5 MG tablet Take 0.5 mg by mouth at bedtime. 11/07/16  Yes [provider]  SENSIPAR 60 MG tablet Take 60 mg by mouth daily.  07/02/15  Yes [provider]  albuterol (PROAIR HFA) 108 (90 Base) MCG/ACT inhaler Inhale 2 puffs into the lungs every 6 (six) hours. Patient not taking: Reported on 07/11/2017 11/16/16   Lonia BloodMcClung, Jeffrey T, MD  FLOVENT DISKUS 100 MCG/BLIST AEPB Inhale 2 puffs into the lungs daily. Patient not taking: Reported on 07/11/2017 11/16/16   Lonia BloodMcClung, Jeffrey T, MD  nitroGLYCERIN (NITROSTAT) 0.4 MG SL tablet Place 0.4 mg under  the tongue every 5 (five) minutes as needed for chest pain.    [provider]  predniSONE (DELTASONE) 20 MG tablet Take 40 mg by mouth daily for 3 days, then 20mg  by mouth daily for 3 days, then 10mg  daily for 3 days Patient not taking: Reported on 07/11/2017 11/16/16   Lonia BloodMcClung, Jeffrey T, MD   Current Facility-Administered Medications  Medication Dose Route Frequency Provider Last Rate Last Dose  . acetaminophen (TYLENOL) tablet 650 mg  650 mg Oral Q6H PRN Hongalgi, Maximino GreenlandAnand D, MD       Or  . acetaminophen (TYLENOL) suppository 650 mg  650 mg Rectal Q6H PRN Hongalgi, Anand D, MD      . albuterol (PROVENTIL) (2.5 MG/3ML) 0.083% nebulizer solution 2.5 mg  2.5 mg Nebulization Q2H PRN Hongalgi, Anand D, MD      . aspirin EC tablet 81 mg  81 mg Oral Daily Elease EtienneHongalgi, Anand D, MD   81 mg at 07/12/17 0948  . ceFEPIme (MAXIPIME) 1  g in dextrose 5 % 50 mL IVPB  1 g Intravenous Q24H Hongalgi, Anand D, MD      . cinacalcet (SENSIPAR) tablet 60 mg  60 mg Oral Daily Elease Etienne, MD   60 mg at 07/12/17 0948  . donepezil (ARICEPT) tablet 5 mg  5 mg Oral Daily Elease Etienne, MD   5 mg at 07/12/17 0948  . [START ON 07/13/2017] feeding supplement (NEPRO CARB STEADY) liquid 237 mL  237 mL Oral Q T,Th,Sa-HD Hongalgi, Anand D, MD      . fluticasone (FLONASE) 50 MCG/ACT nasal spray 2 spray  2 spray Each Nare Daily Elease Etienne, MD   2 spray at 07/12/17 0948  . guaiFENesin (MUCINEX) 12 hr tablet 600 mg  600 mg Oral BID Marcellus Scott D, MD   600 mg at 07/12/17 0948  . heparin injection 5,000 Units  5,000 Units Subcutaneous Q8H Elease Etienne, MD   5,000 Units at 07/12/17 0520  . ipratropium-albuterol (DUONEB) 0.5-2.5 (3) MG/3ML nebulizer solution 3 mL  3 mL Nebulization Q6H Hongalgi, Maximino Greenland, MD   3 mL at 07/12/17 0939  . methylPREDNISolone sodium succinate (SOLU-MEDROL) 125 mg/2 mL injection 60 mg  60 mg Intravenous Q8H Hongalgi, Maximino Greenland, MD   60 mg at 07/12/17 0520  . [START ON 07/13/2017] midodrine  (PROAMATINE) tablet 10 mg  10 mg Oral Q T,Th,Sa-HD Hongalgi, Anand D, MD      . multivitamin (RENA-VIT) tablet 1 tablet  1 tablet Oral QHS Hongalgi, Anand D, MD      . nitroGLYCERIN (NITROSTAT) SL tablet 0.4 mg  0.4 mg Sublingual Q5 min PRN Hongalgi, Anand D, MD      . risperiDONE (RISPERDAL) tablet 0.5 mg  0.5 mg Oral QHS Elease Etienne, MD   0.5 mg at 07/11/17 2117  . sevelamer carbonate (RENVELA) tablet 800 mg  800 mg Oral TID WC Hongalgi, Maximino Greenland, MD   800 mg at 07/12/17 0948   And  . sevelamer carbonate (RENVELA) tablet 1,600 mg  1,600 mg Oral PRN Marcellus Scott D, MD      . sodium chloride flush (NS) 0.9 % injection 3 mL  3 mL Intravenous Q12H Elease Etienne, MD   3 mL at 07/12/17 0949   Labs: Basic Metabolic Panel: Recent Labs  Lab 07/11/17 1212  NA 136  K 4.0  CL 98*  CO2 23  GLUCOSE 84  BUN 15  CREATININE 6.12*  CALCIUM 8.8*   Liver Function Tests: Recent Labs  Lab 07/11/17 1212  AST 32  ALT 13*  ALKPHOS 111  BILITOT 0.8  PROT 7.4  ALBUMIN 3.7   CBC: Recent Labs  Lab 07/11/17 1212  WBC 7.6  NEUTROABS 3.9  HGB 11.8*  HCT 37.2  MCV 96.9  PLT 202   Studies/Results: X-ray Chest Pa And Lateral  Result Date: 07/12/2017 CLINICAL DATA:  Pneumonia EXAM: CHEST  2 VIEW COMPARISON:  07/11/2017 FINDINGS: Cardiac shadow remains enlarged. Lungs are well aerated bilaterally. The previously seen nodular density is not as well per projected on the current exam although a lead is noted in this region. Small effusions are noted bilaterally. IMPRESSION: Previously seen nodule not as well appreciated although a telemetry lead is noted in the expected region. Again CT may be helpful for further evaluation as clinically indicated. Small bilateral pleural effusions. Electronically Signed   By: Alcide Clever M.D.   On: 07/12/2017 07:22   Dg Chest 2 View  Result Date:  07/11/2017 CLINICAL DATA:  Cough, short of breath for 1 week EXAM: CHEST  2 VIEW COMPARISON:  01/13/2017  FINDINGS: Moderate cardiomegaly. Normal vascularity. There is a new right suprahilar nodular density. Lungs are under aerated with bibasilar atelectasis for scar. No pneumothorax. No pleural effusion. IMPRESSION: New right suprahilar nodular density. Pulmonary nodule is not excluded. CT may be helpful. Electronically Signed   By: Jolaine Click M.D.   On: 07/11/2017 13:33   Dialysis Orders:  TTS at [facility] 4hr, 3K/2.5Ca, 400/800, EDW 72.5kg, AVG, heparin 5700 bolus - Calcitriol 1.46mcg PO q HD, no ESA  Assessment/Plan: 1.  Asthma exacerbation/?HCAP: On abx, nebs, and steroids, per primary. 2.  ESRD: Will continue TTS schedule, next 07/13/16. 3.  HyPOtension/volume: Continue midodrine pre-HD. Under EDW, low UF goal. 4.  Anemia: Hgb ok. No ESA. 5.  Metabolic bone disease: Ca fine, continue home meds. 6.  Dementia: Per primary.  Ozzie Hoyle, PA-C 07/12/2017, 10:57 AM  [facility] Pager: [phone]  I have seen and examined this patient and agree with plan and assessment in the above note with renal recommendations/intervention highlighted.  Still wheezing but states that she feels better.  HD for tomorrow to keep her on her outpatient schedule.  Julien Nordmann Pamela Maddy,MD 07/12/2017 4:48 PM

## 2017-07-13 LAB — CBC
HEMATOCRIT: 31.7 % — AB (ref 36.0–46.0)
HEMOGLOBIN: 10.4 g/dL — AB (ref 12.0–15.0)
MCH: 30.8 pg (ref 26.0–34.0)
MCHC: 32.8 g/dL (ref 30.0–36.0)
MCV: 93.8 fL (ref 78.0–100.0)
PLATELETS: 166 10*3/uL (ref 150–400)
RBC: 3.38 MIL/uL — AB (ref 3.87–5.11)
RDW: 15.6 % — ABNORMAL HIGH (ref 11.5–15.5)
WBC: 13.5 10*3/uL — AB (ref 4.0–10.5)

## 2017-07-13 LAB — HEPATITIS B SURFACE ANTIGEN: HEP B S AG: NEGATIVE

## 2017-07-13 LAB — RENAL FUNCTION PANEL
Albumin: 3.3 g/dL — ABNORMAL LOW (ref 3.5–5.0)
Anion gap: 14 (ref 5–15)
BUN: 43 mg/dL — ABNORMAL HIGH (ref 6–20)
CHLORIDE: 97 mmol/L — AB (ref 101–111)
CO2: 21 mmol/L — ABNORMAL LOW (ref 22–32)
CREATININE: 9.13 mg/dL — AB (ref 0.44–1.00)
Calcium: 8.7 mg/dL — ABNORMAL LOW (ref 8.9–10.3)
GFR, EST AFRICAN AMERICAN: 4 mL/min — AB (ref >60)
GFR, EST NON AFRICAN AMERICAN: 4 mL/min — AB (ref >60)
Glucose, Bld: 137 mg/dL — ABNORMAL HIGH (ref 65–99)
POTASSIUM: 4.2 mmol/L (ref 3.5–5.1)
Phosphorus: 2.4 mg/dL — ABNORMAL LOW (ref 2.5–4.6)
Sodium: 132 mmol/L — ABNORMAL LOW (ref 135–145)

## 2017-07-13 MED ORDER — RISPERIDONE 1 MG PO TABS
1.0000 mg | ORAL_TABLET | Freq: Two times a day (BID) | ORAL | Status: DC
  Filled 2017-07-13 (×2): qty 1

## 2017-07-13 MED ORDER — HEPARIN SODIUM (PORCINE) 1000 UNIT/ML DIALYSIS: 1000.0000 [IU] | INTRAMUSCULAR | Status: DC | PRN

## 2017-07-13 MED ORDER — LIDOCAINE-PRILOCAINE 2.5-2.5 % EX CREA: 1.0000 "application " | TOPICAL_CREAM | CUTANEOUS | Status: DC | PRN

## 2017-07-13 MED ORDER — ALBUTEROL SULFATE (2.5 MG/3ML) 0.083% IN NEBU: 3.0000 mL | INHALATION_SOLUTION | Freq: Every day | RESPIRATORY_TRACT | 0 refills | Status: DC | PRN

## 2017-07-13 MED ORDER — PREDNISONE 20 MG PO TABS: 40.0000 mg | ORAL_TABLET | Freq: Every day | ORAL | 0 refills | Status: DC

## 2017-07-13 MED ORDER — HEPARIN SODIUM (PORCINE) 1000 UNIT/ML DIALYSIS: 20.0000 [IU]/kg | INTRAMUSCULAR | Status: DC | PRN

## 2017-07-13 MED ORDER — PENTAFLUOROPROP-TETRAFLUOROETH EX AERO: 1.0000 "application " | INHALATION_SPRAY | CUTANEOUS | Status: DC | PRN

## 2017-07-13 MED ORDER — IPRATROPIUM-ALBUTEROL 0.5-2.5 (3) MG/3ML IN SOLN: 3.0000 mL | Freq: Four times a day (QID) | RESPIRATORY_TRACT | 0 refills | Status: DC

## 2017-07-13 MED ORDER — RISPERIDONE 1 MG PO TABS: 1.0000 mg | ORAL_TABLET | Freq: Two times a day (BID) | ORAL | Status: DC

## 2017-07-13 MED ORDER — GUAIFENESIN ER 600 MG PO TB12: 600.0000 mg | ORAL_TABLET | Freq: Two times a day (BID) | ORAL | 0 refills | Status: DC

## 2017-07-13 MED ORDER — PREDNISONE 20 MG PO TABS: 40.0000 mg | ORAL_TABLET | Freq: Every day | ORAL | Status: DC

## 2017-07-13 MED ORDER — MIDODRINE HCL 5 MG PO TABS
ORAL_TABLET | ORAL | Status: AC
  Filled 2017-07-13: qty 2

## 2017-07-13 MED ORDER — SODIUM CHLORIDE 0.9 % IV SOLN: 100.0000 mL | INTRAVENOUS | Status: DC | PRN

## 2017-07-13 MED ORDER — LIDOCAINE HCL (PF) 1 % IJ SOLN: 5.0000 mL | INTRAMUSCULAR | Status: DC | PRN

## 2017-07-13 MED ORDER — ALTEPLASE 2 MG IJ SOLR: 2.0000 mg | Freq: Once | INTRAMUSCULAR | Status: DC | PRN

## 2017-07-13 MED ORDER — METHYLPREDNISOLONE SODIUM SUCC 40 MG IJ SOLR: 40.0000 mg | Freq: Three times a day (TID) | INTRAMUSCULAR | Status: DC

## 2017-07-13 NOTE — Progress Notes (Signed)
Patient ambulated hall while oxygen saturation was checked.  Sating at 96% while walking.

## 2017-07-13 NOTE — Progress Notes (Signed)
PT Cancellation Note  Patient Details Name: [name] MRN: [id number] DOB: 16-Feb-1933   Cancelled Treatment:    Reason Eval/Treat Not Completed: Patient at procedure or test/unavailable.  Patient in HD.  Will return for PT session at later date.  Thank you.   [name] 07/13/2017, 10:54 AM Durenda HurtSusan H. Renaldo Fiddleravis, PT, [facility] Acute Rehab Services Pager [phone]

## 2017-07-13 NOTE — Procedures (Signed)
Confused Tol HD No instability. Goal 1500cc BFR 350 via RUE AV access. BP 123/70 Lauris PoagAlvin C Desyre Calma

## 2017-07-13 NOTE — Progress Notes (Signed)
Patient did not sleep all night.  Hercdaughter requested for something to help her sleep.  RN asked daughter if patient takes anything for sleep and she responded yes.  RN asked for the name of the medication and she said "I don't remember";  "it start with R".   MD was text paged.  Because patient constantly gets out of bed, daughter ws upset and left.  Shortly after she left asked to seat in the chair.  While in the chair, she took a short nap.  Will continue to monitor.

## 2017-07-13 NOTE — Discharge Summary (Signed)
Physician Discharge Summary  [name] ZOX:[id number] DOB: 1933-05-15 DOA: 07/11/2017  PCP: Fleet Contras, MD  Admit date: 07/11/2017 Discharge date: 07/13/2017   Recommendations for Outpatient Follow-Up:   1. Would recommend end of life discussion with family 2. Continue HD as able   Discharge Diagnosis:   Principal Problem:   Asthma, chronic, unspecified asthma severity, with acute exacerbation Active Problems:   Essential hypertension   GERD   ESRD on dialysis (HCC)   Hypotension   Dementia with behavioral disturbance   Protein-calorie malnutrition, severe (HCC)   Anemia of renal disease   HCAP (healthcare-associated pneumonia)   Acute bronchitis with asthma   RSV (respiratory syncytial virus infection)   Discharge disposition:  Home. With family  Discharge Condition: Improved.  Diet recommendation: renal diet  Wound care: None.   History of Present Illness:   ARLOA PRAK is a 84 year old female with PMH of asthma, ESRD on TTS HD, anemia of chronic kidney disease, dementia with behavioral abnormalities, LLE DVT November 2015, hypotension, HLD, gout, lives at home with her daughter and granddaughter, ambulates mostly independently even though she has a cane at home, presented to the ED with above complaints. Patient is a poor historian due to her dementia and also not fully cooperative with history because she doesn't really want to be admitted but finally agreeable to stay overnight. History obtained from patient's granddaughter at bedside. Patient was in her usual state of health until she completed HD on day prior to admission (holiday schedule). After dialysis patient was noted to be lethargic, unable to open her eyes, more confused than her baseline, rattling cough, dyspnea, wheezing and went to bed early which is also unusual for her. No fever, chills or chest pain. She apparently stays cold all the time. No sick contacts at home. No recent long distance  travel reported. She woke up around midnight with cough, her granddaughter gave her some Robitussin and then she went right back to sleep. She woke up this morning with no relief in her symptoms and hence was brought to the emergency department.     Hospital Course by Problem:   Acute Asthma exacerbation due to RSV -steroids IV to PO -nebs -d/c IV abx -supportive treatment Does not need home o2  Sinus tachy -due to breathing issues -resolved  Elevated lactate: Likely secondary to respiratory distress and transient hypotension with poor tissue perfusion.  -improved  Acute on Chronic hypotension: Patient takes midodrine at home, continue. Was transiently hypotensive (85/53) in ED which has since resolved  Dementia with behavioral abnormalities:Continue risperidone and Aricept.  -much more agitated in the hospital-- suspect will recover better at home  ESRD on TTS HD: -nephrology consulted  Anemia of chronic kidney disease:Stable.      Medical Consultants:    renal   Discharge Exam:      Gen:  Confused and agitated  The results of significant diagnostics from this hospitalization (including imaging, microbiology, ancillary and laboratory) are listed below for reference.     Procedures and Diagnostic Studies:   X-ray Chest Pa And Lateral  Result Date: 07/12/2017 CLINICAL DATA:  Pneumonia EXAM: CHEST  2 VIEW COMPARISON:  07/11/2017 FINDINGS: Cardiac shadow remains enlarged. Lungs are well aerated bilaterally. The previously seen nodular density is not as well per projected on the current exam although a lead is noted in this region. Small effusions are noted bilaterally. IMPRESSION: Previously seen nodule not as well appreciated although a telemetry lead is noted in the  expected region. Again CT may be helpful for further evaluation as clinically indicated. Small bilateral pleural effusions. Electronically Signed   By: Alcide Clever M.D.   On: 07/12/2017 07:22    Dg Chest 2 View  Result Date: 07/11/2017 CLINICAL DATA:  Cough, short of breath for 1 week EXAM: CHEST  2 VIEW COMPARISON:  01/13/2017 FINDINGS: Moderate cardiomegaly. Normal vascularity. There is a new right suprahilar nodular density. Lungs are under aerated with bibasilar atelectasis for scar. No pneumothorax. No pleural effusion. IMPRESSION: New right suprahilar nodular density. Pulmonary nodule is not excluded. CT may be helpful. Electronically Signed   By: Jolaine Click M.D.   On: 07/11/2017 13:33     Labs:   Basic Metabolic Panel: Recent Labs  Lab 07/11/17 1212 07/13/17 0730  NA 136 132*  K 4.0 4.2  CL 98* 97*  CO2 23 21*  GLUCOSE 84 137*  BUN 15 43*  CREATININE 6.12* 9.13*  CALCIUM 8.8* 8.7*  PHOS  --  2.4*   GFR Estimated Creatinine Clearance: 4.1 mL/min (A) (by C-G formula based on SCr of 9.13 mg/dL (H)). Liver Function Tests: Recent Labs  Lab 07/11/17 1212 07/13/17 0730  AST 32  --   ALT 13*  --   ALKPHOS 111  --   BILITOT 0.8  --   PROT 7.4  --   ALBUMIN 3.7 3.3*   No results for input(s): LIPASE, AMYLASE in the last 168 hours. No results for input(s): AMMONIA in the last 168 hours. Coagulation profile No results for input(s): INR, PROTIME in the last 168 hours.  CBC: Recent Labs  Lab 07/11/17 1212 07/13/17 0730  WBC 7.6 13.5*  NEUTROABS 3.9  --   HGB 11.8* 10.4*  HCT 37.2 31.7*  MCV 96.9 93.8  PLT 202 166   Cardiac Enzymes: No results for input(s): CKTOTAL, CKMB, CKMBINDEX, TROPONINI in the last 168 hours. BNP: Invalid input(s): POCBNP CBG: No results for input(s): GLUCAP in the last 168 hours. D-Dimer No results for input(s): DDIMER in the last 72 hours. Hgb A1c No results for input(s): HGBA1C in the last 72 hours. Lipid Profile No results for input(s): CHOL, HDL, LDLCALC, TRIG, CHOLHDL, LDLDIRECT in the last 72 hours. Thyroid function studies No results for input(s): TSH, T4TOTAL, T3FREE, THYROIDAB in the last 72 hours.  Invalid  input(s): FREET3 Anemia work up No results for input(s): VITAMINB12, FOLATE, FERRITIN, TIBC, IRON, RETICCTPCT in the last 72 hours. Microbiology Recent Results (from the past 240 hour(s))  Respiratory Panel by PCR     Status: Abnormal   Collection Time: 07/11/17  4:06 PM  Result Value Ref Range Status   Adenovirus NOT DETECTED NOT DETECTED Final   Coronavirus 229E NOT DETECTED NOT DETECTED Final   Coronavirus HKU1 NOT DETECTED NOT DETECTED Final   Coronavirus NL63 NOT DETECTED NOT DETECTED Final   Coronavirus OC43 NOT DETECTED NOT DETECTED Final   Metapneumovirus NOT DETECTED NOT DETECTED Final   Rhinovirus / Enterovirus NOT DETECTED NOT DETECTED Final   Influenza A NOT DETECTED NOT DETECTED Final   Influenza B NOT DETECTED NOT DETECTED Final   Parainfluenza Virus 1 NOT DETECTED NOT DETECTED Final   Parainfluenza Virus 2 NOT DETECTED NOT DETECTED Final   Parainfluenza Virus 3 NOT DETECTED NOT DETECTED Final   Parainfluenza Virus 4 NOT DETECTED NOT DETECTED Final   Respiratory Syncytial Virus DETECTED (A) NOT DETECTED Final    Comment: CRITICAL RESULT CALLED TO, READ BACK BY AND VERIFIED WITH: Laurence Spates RN 9:55  07/12/17 ([email or web address])    Bordetella pertussis NOT DETECTED NOT DETECTED Final   Chlamydophila pneumoniae NOT DETECTED NOT DETECTED Final   Mycoplasma pneumoniae NOT DETECTED NOT DETECTED Final  Blood Culture (routine x 2)     Status: None (Preliminary result)   Collection Time: 07/11/17  4:58 PM  Result Value Ref Range Status   Specimen Description BLOOD LEFT HAND  Final   Special Requests IN PEDIATRIC BOTTLE Blood Culture adequate volume  Final   Culture NO GROWTH 2 DAYS  Final   Report Status PENDING  Incomplete  Blood Culture (routine x 2)     Status: None (Preliminary result)   Collection Time: 07/11/17  5:00 PM  Result Value Ref Range Status   Specimen Description BLOOD LEFT WRIST  Final   Special Requests IN PEDIATRIC BOTTLE Blood Culture adequate volume  Final    Culture NO GROWTH 2 DAYS  Final   Report Status PENDING  Incomplete  MRSA PCR Screening     Status: None   Collection Time: 07/11/17 11:26 PM  Result Value Ref Range Status   MRSA by PCR NEGATIVE NEGATIVE Final    Comment:        The GeneXpert MRSA Assay (FDA approved for NASAL specimens only), is one component of a comprehensive MRSA colonization surveillance program. It is not intended to diagnose MRSA infection nor to guide or monitor treatment for MRSA infections.      Discharge Instructions:   Discharge Instructions    Increase activity slowly   Complete by:  As directed      Allergies as of 07/13/2017      Reactions   Nsaids Other (See Comments)   REACTION: Avoid NSAIDS (renal failure)   Latex Hives, Swelling   Tape Other (See Comments)   SKIN IS VERY THIN AND TEARS EASILY      Medication List    STOP taking these medications   FLOVENT DISKUS 100 MCG/BLIST Aepb Generic drug:  Fluticasone Propionate (Inhal)     TAKE these medications   albuterol 108 (90 Base) MCG/ACT inhaler Commonly known as:  PROAIR HFA Inhale 2 puffs into the lungs every 6 (six) hours.   albuterol (2.5 MG/3ML) 0.083% nebulizer solution Commonly known as:  PROVENTIL Take 3 mLs by nebulization daily as needed for wheezing or shortness of breath.   ASPIRIN LOW DOSE 81 MG EC tablet Generic drug:  aspirin Take 81 mg by mouth daily.   donepezil 5 MG tablet Commonly known as:  ARICEPT Take 5 mg by mouth daily.   feeding supplement (NEPRO CARB STEADY) Liqd Take 237 mLs by mouth 2 (two) times daily between meals. What changed:  when to take this   guaiFENesin 600 MG 12 hr tablet Commonly known as:  MUCINEX Take 1 tablet (600 mg total) by mouth 2 (two) times daily.   ipratropium-albuterol 0.5-2.5 (3) MG/3ML Soln Commonly known as:  DUONEB Take 3 mLs by nebulization every 6 (six) hours.   midodrine 10 MG tablet Commonly known as:  PROAMATINE Take 1 tablet (10 mg total) by mouth  Every Tuesday,Thursday,and Saturday with dialysis.   mometasone 50 MCG/ACT nasal spray Commonly known as:  NASONEX Place 2 sprays into the nose at bedtime.   multivitamin Tabs tablet Take 1 tablet by mouth at bedtime.   nitroGLYCERIN 0.4 MG SL tablet Commonly known as:  NITROSTAT Place 0.4 mg under the tongue every 5 (five) minutes as needed for chest pain.   predniSONE 20 MG tablet Commonly known as:  DELTASONE Take 2 tablets (40 mg total) by mouth daily with breakfast. Start taking on:  07/14/2017 What changed:    how much to take  how to take this  when to take this  additional instructions   RENVELA 800 MG tablet Generic drug:  sevelamer carbonate Take 800-1,600 mg by mouth See admin instructions. 800 mg three times daily with meals and 1,600 mg with snacks   risperiDONE 0.5 MG tablet Commonly known as:  RISPERDAL Take 0.5 mg by mouth at bedtime.   SENSIPAR 60 MG tablet Generic drug:  cinacalcet Take 60 mg by mouth daily.            Durable Medical Equipment  (From admission, onward)        Start     Ordered   07/13/17 1442  For home use only DME Nebulizer/meds  Once    Question:  Patient needs a nebulizer to treat with the following condition  Answer:  RSV (acute bronchiolitis due to respiratory syncytial virus)   07/13/17 1441     Follow-up Information    Fleet Contras, MD Follow up in 1 week(s).   Specialty:  Internal Medicine Contact information: [address] [place] [address] [phone]        [facility] - Dme Follow up.   Why:  nebulizer machine arranged-[facility] to f/u Contact information: [address] [place] [address] [phone]            Time coordinating discharge: 35 min  Signed:  Joseph Art   [facility] 07/13/2017, 5:52 PM

## 2017-07-13 NOTE — Progress Notes (Signed)
[name] to be D/C'd Home per MD order.  Discussed with the patient and all questions fully answered.  VSS, Skin clean, dry and intact without evidence of skin break down, no evidence of skin tears noted. IV catheter discontinued intact. Site without signs and symptoms of complications. Dressing and pressure applied.  An After Visit Summary was printed and given to the patient and daughter [name]. Patient received prescription.  D/c education completed with patient/family including follow up instructions, medication list, d/c activities limitations if indicated, with other d/c instructions as indicated by MD - patient able to verbalize understanding, all questions fully answered.   Patient instructed to return to ED, call 911, or call MD for any changes in condition.   Patient escorted via WC, and D/C home via private auto.  Caren HazyKamila A Lakelyn Straus 07/13/2017 4:11 PM

## 2017-07-16 LAB — CULTURE, BLOOD (ROUTINE X 2)
CULTURE: NO GROWTH
Culture: NO GROWTH
SPECIAL REQUESTS: ADEQUATE
SPECIAL REQUESTS: ADEQUATE

## 2017-09-29 ENCOUNTER — Other Ambulatory Visit: Payer: Self-pay | Admitting: Gerontology

## 2017-09-29 ENCOUNTER — Ambulatory Visit
Admission: RE | Admit: 2017-09-29 | Discharge: 2017-09-29 | Disposition: A | Discharge status: Home / Self Care | Payer: Medicare (Managed Care) | Source: Ambulatory Visit | Attending: Gerontology | Admitting: Gerontology

## 2017-09-29 DIAGNOSIS — J449 Chronic obstructive pulmonary disease, unspecified: Secondary | ICD-10-CM

## 2017-10-23 ENCOUNTER — Emergency Department (HOSPITAL_COMMUNITY)
Admission: EM | Admit: 2017-10-23 | Discharge: 2017-10-23 | Disposition: A | Discharge status: Home / Self Care | Payer: Medicare (Managed Care) | Attending: Emergency Medicine | Admitting: Emergency Medicine

## 2017-10-23 ENCOUNTER — Encounter (HOSPITAL_COMMUNITY): Payer: Self-pay

## 2017-10-23 DIAGNOSIS — Z91040 Latex allergy status: Secondary | ICD-10-CM | POA: Diagnosis not present

## 2017-10-23 DIAGNOSIS — I120 Hypertensive chronic kidney disease with stage 5 chronic kidney disease or end stage renal disease: Secondary | ICD-10-CM | POA: Insufficient documentation

## 2017-10-23 DIAGNOSIS — Z992 Dependence on renal dialysis: Secondary | ICD-10-CM | POA: Diagnosis not present

## 2017-10-23 DIAGNOSIS — G8929 Other chronic pain: Secondary | ICD-10-CM | POA: Diagnosis not present

## 2017-10-23 DIAGNOSIS — Z79899 Other long term (current) drug therapy: Secondary | ICD-10-CM | POA: Insufficient documentation

## 2017-10-23 DIAGNOSIS — N186 End stage renal disease: Secondary | ICD-10-CM | POA: Diagnosis not present

## 2017-10-23 DIAGNOSIS — M545 Low back pain: Secondary | ICD-10-CM | POA: Diagnosis present

## 2017-10-23 DIAGNOSIS — J45909 Unspecified asthma, uncomplicated: Secondary | ICD-10-CM | POA: Diagnosis not present

## 2017-10-23 DIAGNOSIS — F0390 Unspecified dementia without behavioral disturbance: Secondary | ICD-10-CM | POA: Diagnosis not present

## 2017-10-23 DIAGNOSIS — Z7982 Long term (current) use of aspirin: Secondary | ICD-10-CM | POA: Insufficient documentation

## 2017-10-23 DIAGNOSIS — F419 Anxiety disorder, unspecified: Secondary | ICD-10-CM | POA: Diagnosis not present

## 2017-10-23 DIAGNOSIS — F17220 Nicotine dependence, chewing tobacco, uncomplicated: Secondary | ICD-10-CM | POA: Insufficient documentation

## 2017-10-23 NOTE — ED Provider Notes (Signed)
[facility]-EMERGENCY DEPT Provider Note   CSN: [id number] Arrival date & time: 10/23/17  2100     History   Chief Complaint Chief Complaint  Patient presents with  . Anxiety  . Back Pain    HPI CORIN FORMISANO is a 84 y.o. female.  HPI  A LEVEL 5 CAVEAT PERTAINS DUE TO DEMENTIA Patient with history of dementia, hypertension, end-stage renal disease on dialysis, chronic low back pain presents with complaint of hyperventilating and becoming anxious due to pain in her back.  She was at home with her granddaughter he did not know what to do to help her so EMS was called.  Patient is calm and comfortable on my evaluation.  She denies any anxiety feelings or complaints of pain.  Her daughter is here with her tonight and states she has had no weakness of her legs, no change in her incontinence no urinary retention.  She had a fall several days ago during the night.  Her back pain began before the fall.  She does not take blood thinners. There are no other associated systemic symptoms, there are no other alleviating or modifying factors.  Pt currently has no complaints  Past Medical History:  Diagnosis Date  . Anemia, chronic disease    /notes 07/11/2017  . Arthritis   . Asthma   . Dementia   . DVT (deep venous thrombosis) (HCC)    LLE DVT 06/06/14  . ESRD (end stage renal disease) on dialysis (HCC)    "TTS; [facility]" (07/11/2017)  . Gout   . Hyperlipemia   . Hypertension     Patient Active Problem List   Diagnosis Date Noted  . RSV (respiratory syncytial virus infection) 07/12/2017  . Acute bronchitis with asthma 07/11/2017  . Chronic orthostatic hypotension 11/15/2016  . HLD (hyperlipidemia) 11/15/2016  . Thrombocytopenia (HCC) 11/15/2016  . Colitis 08/10/2014  . HCAP (healthcare-associated pneumonia) 07/11/2014  . Oral thrush 07/08/2014  . Productive cough 07/05/2014  . Sepsis (HCC) 07/05/2014  . Sinus tachycardia 07/05/2014  . Pleural effusion on  left 07/05/2014  . Anemia of renal disease 07/05/2014  . Leukocytosis 07/05/2014  . Palliative care encounter 06/16/2014  . Decreased appetite 06/16/2014  . Protein-calorie malnutrition, severe (HCC) 06/14/2014  . DVT (deep venous thrombosis) (HCC) 06/09/2014  . Anxiety state 06/09/2014  . Dementia with behavioral disturbance   . Arterial hypotension   . Hypotension 06/05/2014  . SOB (shortness of breath) 06/05/2014  . UTI (lower urinary tract infection) 06/05/2014  . Diarrhea 06/05/2014  . Mechanical complication of other vascular device, implant, and graft 05/07/2013  . ESRD on dialysis (HCC) 05/07/2013  . Loss of weight 03/13/2013  . Hyperlipidemia 05/01/2009  . ALLERGIC RHINITIS 11/21/2007  . ACUTE LARYNGITIS, WITHOUT MENTION OF OBSTRUCTIO 07/18/2007  . GERD 05/21/2007  . GOUT 11/18/2006  . Essential hypertension 11/18/2006  . Asthma, chronic, unspecified asthma severity, with acute exacerbation 11/18/2006  . DIVERTICULOSIS, COLON 11/18/2006  . CARDIOMEGALY, MILD 02/08/1994    Past Surgical History:  Procedure Laterality Date  . AV FISTULA PLACEMENT  ~ 2010  . AV FISTULA PLACEMENT Right 10/09/2015   Procedure: INSERTION OF ARTERIOVENOUS (AV) GORE-TEX GRAFT RIGHT UPPER ARM;  Surgeon: Pryor Ochoa, MD;  Location: [facility] OR;  Service: Vascular;  Laterality: Right;  . CATARACT EXTRACTION W/ INTRAOCULAR LENS  IMPLANT, BILATERAL Bilateral   . INSERTION OF DIALYSIS CATHETER Right 08/30/2015   Procedure: INSERTION OF DIALYSIS CATHETER RIGHT INTERNAL JUGULAR ;  Surgeon: Ottie Glazier  Imogene Burn, MD;  Location: [facility] OR;  Service: Vascular;  Laterality: Right;  . IR GENERIC HISTORICAL  09/14/2016   IR US GUIDE VASC ACCESS RIGHT 09/14/2016 [name], MD [facility]  . IR GENERIC HISTORICAL Right 09/14/2016   IR THROMBECTOMY AV FISTULA W/THROMBOLYSIS/PTA INC/SHUNT/IMG RIGHT 09/14/2016 [name], MD [facility]  . LIGATION OF ARTERIOVENOUS  FISTULA Left 08/30/2015   Procedure: LIGATION OF ARTERIOVENOUS   FISTULA  LEFT ARM;  Surgeon: Fransisco Hertz, MD;  Location: [facility] OR;  Service: Vascular;  Laterality: Left;  . REVISON OF ARTERIOVENOUS FISTULA Left 08/28/2015   Procedure: REVISON OF ARTERIOVENOUS FISTULA;  Surgeon: Pryor Ochoa, MD;  Location: [facility] OR;  Service: Vascular;  Laterality: Left;  . TUBAL LIGATION       OB History   None      Home Medications    Prior to Admission medications   Medication Sig Start Date End Date Taking? Authorizing Provider  albuterol (PROAIR HFA) 108 (90 Base) MCG/ACT inhaler Inhale 2 puffs into the lungs every 6 (six) hours. Patient not taking: Reported on 07/11/2017 11/16/16   Lonia Blood, MD  albuterol (PROVENTIL) (2.5 MG/3ML) 0.083% nebulizer solution Take 3 mLs by nebulization daily as needed for wheezing or shortness of breath. 07/13/17   Marlin Canary U, DO  ASPIRIN LOW DOSE 81 MG EC tablet Take 81 mg by mouth daily.  08/02/15   [provider]  donepezil (ARICEPT) 5 MG tablet Take 5 mg by mouth daily. 11/07/16   [provider]  guaiFENesin (MUCINEX) 600 MG 12 hr tablet Take 1 tablet (600 mg total) by mouth 2 (two) times daily. 07/13/17   Joseph Art, DO  ipratropium-albuterol (DUONEB) 0.5-2.5 (3) MG/3ML SOLN Take 3 mLs by nebulization every 6 (six) hours. 07/13/17   Joseph Art, DO  midodrine (PROAMATINE) 10 MG tablet Take 1 tablet (10 mg total) by mouth Every Tuesday,Thursday,and Saturday with dialysis. 11/17/16   Lonia Blood, MD  mometasone (NASONEX) 50 MCG/ACT nasal spray Place 2 sprays into the nose at bedtime. 11/07/16   [provider]  multivitamin (RENA-VIT) TABS tablet Take 1 tablet by mouth at bedtime.     [provider]  nitroGLYCERIN (NITROSTAT) 0.4 MG SL tablet Place 0.4 mg under the tongue every 5 (five) minutes as needed for chest pain.    [provider]  Nutritional Supplements (FEEDING SUPPLEMENT, NEPRO CARB STEADY,) LIQD Take 237 mLs by mouth 2 (two) times daily between  meals. Patient taking differently: Take 237 mLs by mouth Every Tuesday,Thursday,and Saturday with dialysis.  06/09/14   Dellinger, Tora Kindred, PA-C  predniSONE (DELTASONE) 20 MG tablet Take 2 tablets (40 mg total) by mouth daily with breakfast. 07/14/17   Joseph Art, DO  RENVELA 800 MG tablet Take 800-1,600 mg by mouth See admin instructions. 800 mg three times daily with meals and 1,600 mg with snacks 06/21/15   [provider]  risperiDONE (RISPERDAL) 0.5 MG tablet Take 0.5 mg by mouth at bedtime. 11/07/16   [provider]  SENSIPAR 60 MG tablet Take 60 mg by mouth daily.  07/02/15   [provider]    Family History Family History  Problem Relation Age of Onset  . Colon cancer Neg Hx   . Diabetes Mellitus II Neg Hx     Social History Social History   Tobacco Use  . Smoking status: Never Smoker  . Smokeless tobacco: Current User    Types: Snuff  Substance Use  Topics  . Alcohol use: No    Alcohol/week: 0.0 oz  . Drug use: No     Allergies   Nsaids; Latex; and Tape   Review of Systems Review of Systems  UNABLE TO OBTAIN ROS DUE TO LEVEL 5 CAVEAT   Physical Exam Updated Vital Signs BP 101/64 (BP Location: Left Arm)   Pulse 78   Temp 97.6 F (36.4 C) (Oral)   Resp 14   SpO2 94%  Vitals reviewed Physical Exam  Physical Examination: General appearance - alert, well appearing, and in no distress Mental status - alert, oriented to person, place, and time Eyes - pupils equal and reactive, extraocular eye movements intact with pain, mild periorbital bruising- daughter states has been present several days after fall, no bony point tenderness Mouth - mucous membranes moist, pharynx normal without lesions Neck - no midline tenderness to palpation Chest - clear to auscultation, no wheezes, rales or rhonchi, symmetric air entry Heart - normal rate, regular rhythm, normal S1, S2, no murmurs, rubs, clicks or gallops Abdomen - soft, nontender,  nondistended, no masses or organomegaly Back exam - full range of motion, no midline or paraspinal tenderness to palpation of c/t/l spine, no CVA Tenderness- checked multiple times and no tenderness to palpation Neurological - alert, orientedx 2, normal speech, strength 5/5 in extremities Extremities - peripheral pulses normal, no pedal edema, no clubbing or cyanosis Skin - normal coloration and turgor, no rashes Psych- calm, pleasant, answering questions   ED Treatments / Results  Labs (all labs ordered are listed, but only abnormal results are displayed) Labs Reviewed - No data to display  EKG None  Radiology No results found.  Procedures Procedures (including critical care time)  Medications Ordered in ED Medications - No data to display   Initial Impression / Assessment and Plan / ED Course  I have reviewed the triage vital signs and the nursing notes.  Pertinent labs & imaging results that were available during my care of the patient were reviewed by me and considered in my medical decision making (see chart for details).     Patient presents to the ED due to an anxiety attack.  She was complaining of chronic low back pain and began to hyperventilate.  Daughter states that she was not given any anxiety medication or pain medication at home.  Daughter states her anxiety medication may be expired but she will contact [facility] to have a refill.  On my evaluation patient is calm pleasant and smiling.  She does not have any pain.  I examined her back multiple times and she has no midline tenderness of her cervical thoracic or lumbar spine.  She moves comfortably in the bed without any grimacing or signs of pain.  Discussed with daughter that x-ray would not be indicated tonight as I have low suspicion for fracture.  Offered pain medication but daughter states that she has medication at home that she can take.  Daughter is agreeable with plan for discharge.  Discharged with strict return  precautions.  Pt agreeable with plan.  Final Clinical Impressions(s) / ED Diagnoses   Final diagnoses:  Anxiety  Chronic low back pain, unspecified back pain laterality, with sciatica presence unspecified    ED Discharge Orders    None       Phillis HaggisMabe, Martha L, MD 10/23/17 2333

## 2017-10-23 NOTE — ED Triage Notes (Signed)
Pt id due for dialysis in the morning

## 2017-10-23 NOTE — Discharge Instructions (Signed)
Return to the ED with any concerns including chest pain, difficulty breathing, weakness of legs, not able to urinate, decreased level of alertness/lethargy, or any other alarming symptoms

## 2017-10-23 NOTE — ED Triage Notes (Signed)
Pt complains of chronic back pain that caused her to have anxiety and hyperventilate Pt was taken off her meds for anxiety

## 2017-11-22 ENCOUNTER — Emergency Department (HOSPITAL_COMMUNITY): Payer: Medicare (Managed Care)

## 2017-11-22 ENCOUNTER — Encounter (HOSPITAL_COMMUNITY): Payer: Self-pay

## 2017-11-22 ENCOUNTER — Inpatient Hospital Stay (HOSPITAL_COMMUNITY)
Admission: EM | Admit: 2017-11-22 | Discharge: 2017-11-23 | DRG: 682 | Disposition: A | Discharge status: Home / Self Care | Payer: Medicare (Managed Care) | Attending: Student in an Organized Health Care Education/Training Program | Admitting: Student in an Organized Health Care Education/Training Program

## 2017-11-22 DIAGNOSIS — Z66 Do not resuscitate: Secondary | ICD-10-CM | POA: Diagnosis present

## 2017-11-22 DIAGNOSIS — I959 Hypotension, unspecified: Secondary | ICD-10-CM | POA: Diagnosis present

## 2017-11-22 DIAGNOSIS — Z86718 Personal history of other venous thrombosis and embolism: Secondary | ICD-10-CM

## 2017-11-22 DIAGNOSIS — M109 Gout, unspecified: Secondary | ICD-10-CM | POA: Diagnosis not present

## 2017-11-22 DIAGNOSIS — N186 End stage renal disease: Secondary | ICD-10-CM | POA: Diagnosis present

## 2017-11-22 DIAGNOSIS — Z91048 Other nonmedicinal substance allergy status: Secondary | ICD-10-CM

## 2017-11-22 DIAGNOSIS — Z79899 Other long term (current) drug therapy: Secondary | ICD-10-CM | POA: Diagnosis not present

## 2017-11-22 DIAGNOSIS — I120 Hypertensive chronic kidney disease with stage 5 chronic kidney disease or end stage renal disease: Principal | ICD-10-CM | POA: Diagnosis present

## 2017-11-22 DIAGNOSIS — Z7982 Long term (current) use of aspirin: Secondary | ICD-10-CM | POA: Diagnosis not present

## 2017-11-22 DIAGNOSIS — Z992 Dependence on renal dialysis: Secondary | ICD-10-CM | POA: Diagnosis not present

## 2017-11-22 DIAGNOSIS — F0391 Unspecified dementia with behavioral disturbance: Secondary | ICD-10-CM | POA: Diagnosis present

## 2017-11-22 DIAGNOSIS — E162 Hypoglycemia, unspecified: Secondary | ICD-10-CM | POA: Diagnosis present

## 2017-11-22 DIAGNOSIS — Z515 Encounter for palliative care: Secondary | ICD-10-CM | POA: Diagnosis present

## 2017-11-22 DIAGNOSIS — J45909 Unspecified asthma, uncomplicated: Secondary | ICD-10-CM | POA: Diagnosis not present

## 2017-11-22 DIAGNOSIS — F17220 Nicotine dependence, chewing tobacco, uncomplicated: Secondary | ICD-10-CM | POA: Diagnosis not present

## 2017-11-22 DIAGNOSIS — Z886 Allergy status to analgesic agent status: Secondary | ICD-10-CM | POA: Diagnosis not present

## 2017-11-22 DIAGNOSIS — R4182 Altered mental status, unspecified: Secondary | ICD-10-CM | POA: Diagnosis present

## 2017-11-22 DIAGNOSIS — F419 Anxiety disorder, unspecified: Secondary | ICD-10-CM | POA: Diagnosis present

## 2017-11-22 DIAGNOSIS — Z7189 Other specified counseling: Secondary | ICD-10-CM | POA: Diagnosis not present

## 2017-11-22 DIAGNOSIS — Z91040 Latex allergy status: Secondary | ICD-10-CM

## 2017-11-22 DIAGNOSIS — F0390 Unspecified dementia without behavioral disturbance: Secondary | ICD-10-CM | POA: Diagnosis not present

## 2017-11-22 DIAGNOSIS — R680 Hypothermia, not associated with low environmental temperature: Secondary | ICD-10-CM | POA: Diagnosis not present

## 2017-11-22 DIAGNOSIS — G9340 Encephalopathy, unspecified: Secondary | ICD-10-CM | POA: Diagnosis not present

## 2017-11-22 DIAGNOSIS — Z888 Allergy status to other drugs, medicaments and biological substances status: Secondary | ICD-10-CM

## 2017-11-22 DIAGNOSIS — G9341 Metabolic encephalopathy: Secondary | ICD-10-CM | POA: Diagnosis present

## 2017-11-22 DIAGNOSIS — E785 Hyperlipidemia, unspecified: Secondary | ICD-10-CM | POA: Diagnosis not present

## 2017-11-22 LAB — COMPREHENSIVE METABOLIC PANEL
ALBUMIN: 3.1 g/dL — AB (ref 3.5–5.0)
ALT: 16 U/L (ref 14–54)
AST: 27 U/L (ref 15–41)
Alkaline Phosphatase: 51 U/L (ref 38–126)
Anion gap: 15 (ref 5–15)
BILIRUBIN TOTAL: 0.8 mg/dL (ref 0.3–1.2)
BUN: 27 mg/dL — ABNORMAL HIGH (ref 6–20)
CALCIUM: 12.7 mg/dL — AB (ref 8.9–10.3)
CO2: 26 mmol/L (ref 22–32)
Chloride: 99 mmol/L — ABNORMAL LOW (ref 101–111)
Creatinine, Ser: 9.56 mg/dL — ABNORMAL HIGH (ref 0.44–1.00)
GFR calc Af Amer: 4 mL/min — ABNORMAL LOW (ref >60)
GFR, EST NON AFRICAN AMERICAN: 3 mL/min — AB (ref >60)
GLUCOSE: 295 mg/dL — AB (ref 65–99)
POTASSIUM: 4.4 mmol/L (ref 3.5–5.1)
Sodium: 140 mmol/L (ref 135–145)
TOTAL PROTEIN: 5.8 g/dL — AB (ref 6.5–8.1)

## 2017-11-22 LAB — URINALYSIS, ROUTINE W REFLEX MICROSCOPIC
Bilirubin Urine: NEGATIVE
Glucose, UA: NEGATIVE mg/dL
Ketones, ur: NEGATIVE mg/dL
NITRITE: NEGATIVE
PROTEIN: 30 mg/dL — AB
SPECIFIC GRAVITY, URINE: 1.01 (ref 1.005–1.030)
pH: 8 (ref 5.0–8.0)

## 2017-11-22 LAB — URINALYSIS, MICROSCOPIC (REFLEX)

## 2017-11-22 LAB — CBC
HEMATOCRIT: 33.8 % — AB (ref 36.0–46.0)
HEMOGLOBIN: 10.5 g/dL — AB (ref 12.0–15.0)
MCH: 30.6 pg (ref 26.0–34.0)
MCHC: 31.1 g/dL (ref 30.0–36.0)
MCV: 98.5 fL (ref 78.0–100.0)
Platelets: 145 10*3/uL — ABNORMAL LOW (ref 150–400)
RBC: 3.43 MIL/uL — AB (ref 3.87–5.11)
RDW: 16.7 % — ABNORMAL HIGH (ref 11.5–15.5)
WBC: 3.9 10*3/uL — AB (ref 4.0–10.5)

## 2017-11-22 LAB — PROTIME-INR
INR: 0.97
PROTHROMBIN TIME: 12.8 s (ref 11.4–15.2)

## 2017-11-22 LAB — CBG MONITORING, ED: GLUCOSE-CAPILLARY: 108 mg/dL — AB (ref 65–99)

## 2017-11-22 MED ORDER — SODIUM CHLORIDE 0.9 % IV BOLUS
250.0000 mL | Freq: Once | INTRAVENOUS | Status: AC
  Administered 2017-11-22: 250 mL via INTRAVENOUS

## 2017-11-22 MED ORDER — PIPERACILLIN-TAZOBACTAM 3.375 G IVPB 30 MIN
3.3750 g | Freq: Once | INTRAVENOUS | Status: AC
  Administered 2017-11-22: 3.375 g via INTRAVENOUS
  Filled 2017-11-22: qty 50

## 2017-11-22 MED ORDER — ACETAMINOPHEN 650 MG RE SUPP: 650.0000 mg | Freq: Four times a day (QID) | RECTAL | Status: DC | PRN

## 2017-11-22 MED ORDER — IOHEXOL 300 MG/ML  SOLN
80.0000 mL | Freq: Once | INTRAMUSCULAR | Status: AC | PRN
  Administered 2017-11-22: 80 mL via INTRAVENOUS

## 2017-11-22 MED ORDER — ACETAMINOPHEN 325 MG PO TABS: 650.0000 mg | ORAL_TABLET | Freq: Four times a day (QID) | ORAL | Status: DC | PRN

## 2017-11-22 MED ORDER — SODIUM CHLORIDE 0.9 % IV SOLN
1.0000 g | INTRAVENOUS | Status: DC
  Administered 2017-11-22: 1 g via INTRAVENOUS
  Filled 2017-11-22: qty 10

## 2017-11-22 MED ORDER — VANCOMYCIN HCL 10 G IV SOLR
1500.0000 mg | Freq: Once | INTRAVENOUS | Status: AC
  Administered 2017-11-22: 1500 mg via INTRAVENOUS
  Filled 2017-11-22: qty 1500

## 2017-11-22 MED ORDER — SODIUM CHLORIDE 0.9 % IV BOLUS
500.0000 mL | Freq: Once | INTRAVENOUS | Status: AC
  Administered 2017-11-22: 500 mL via INTRAVENOUS

## 2017-11-22 NOTE — Progress Notes (Signed)
Pharmacy Antibiotic Note  [name] is a 84 y.o. female admitted on 11/22/2017 with sepsis.  Pharmacy has been consulted for vancomycin dosing.  WBC 3.9, AMS, hypotensive. Has had recent weight loss. On HD - last session was on Saturday; missed Tuesday session. Received 1 dose zosyn.  Plan: Vancomycin 1500 mg IV once   Follow up if continuing zosyn  Follow up with HD schedule for future doses Monitor clinical pic, cx results  Height: 5' (152.4 cm) Weight: 138 lb 0.1 oz (62.6 kg) IBW/kg (Calculated) : 45.5  Temp (24hrs), Avg:95.9 F (35.5 C), Min:95.9 F (35.5 C), Max:95.9 F (35.5 C)  Recent Labs  Lab 11/22/17 1220  WBC 3.9*  CREATININE 9.56*    Estimated Creatinine Clearance: 3.7 mL/min (A) (by C-G formula based on SCr of 9.56 mg/dL (H)).    Allergies  Allergen Reactions  . Nsaids Other (See Comments)    REACTION: Avoid NSAIDS (renal failure)  . Latex Hives and Swelling  . Tape Other (See Comments)    SKIN IS VERY THIN AND TEARS EASILY    Antimicrobials this admission: Vancomycin 5/15 >>  Zosyn 5/15 x1  Dose adjustments this admission: N/A  Microbiology results: 5/15 BCx: sent 5/15 UCx: sent   Thank you for allowing pharmacy to be a part of this patient's care.  Girard Cooter, PharmD Clinical Pharmacist  Pager: [phone] Phone: [phone] 11/22/2017 3:41 PM

## 2017-11-22 NOTE — ED Notes (Signed)
Per Maralyn Sago, RN EDP aware of low BP and palliative consult is in the works

## 2017-11-22 NOTE — H&P (Signed)
Date: 11/22/2017               Patient Name:  [name] MRN: [id number]  DOB: Dec 02, 1932 Age / Sex: 84 y.o., female   PCP: Fleet Contras, MD         Medical Service: Internal Medicine Teaching Service         Attending Physician: Dr. Oswaldo Done, Marquita Palms, *    First Contact: Dr. Renaldo Reel Pager: [phone]  Second Contact: Dr. Antony Contras Pager: [phone]       After Hours (After 5p/  First Contact Pager: [phone]  weekends / holidays): Second Contact Pager: [phone]   Chief Complaint: Altered Mental Status   History of Present Illness: [name] is an 84 yo F with a past medical history of dementia, ESRD on TThSa, DVT, HLD, gout, hypotension, asthma who presented to the ED with altered mental status. Pt's family assisted with history.   Family reports recent decline in pt's mental status for the last week or longer. At her prior baseline, she was ambulatory and conversational and living with her daughter. She has had progressively more falls recently--first fall about 1.5 months ago. Son reports these are fairly controlled as it appears pt becomes fatigues and goes to sit down onto the floor at these times.  She has also had behavioral issues with agitation in the past which have also worsened over the last week. This has led to difficulty with HD sessions with pt pulling on lines for HD, last received HD on Sat (5/11), missed yesterday. Daughter also notes decreased PO intake for the last week with little intake of fluids or solids.   In the ED, T 95.9, HR 82, BP 107/58, 100% on RA. Labs notable for WBC 3.9, Hgb 10.5, Cr 9.56, Plt 145. U/A with large leukocytes but negative nitrites. CT head without acute process, CT Abd/Pelvis also without acute findings. She received 500 cc bolus for hypotension, and empiric abx with Vanc/Zosyn. She was admitted for further management.    Meds:  Current Meds  Medication Sig  . acetaminophen (TYLENOL) 500 MG tablet Take 1,000 mg by mouth every 12  (twelve) hours as needed for mild pain or headache.  . albuterol (PROVENTIL) (2.5 MG/3ML) 0.083% nebulizer solution Take 3 mLs by nebulization daily as needed for wheezing or shortness of breath.  . ASPIRIN LOW DOSE 81 MG EC tablet Take 81 mg by mouth daily.   Marland Kitchen donepezil (ARICEPT) 10 MG tablet Take 10 mg by mouth at bedtime.   . Melatonin 5 MG TABS Take 5 mg by mouth at bedtime.  . Menthol, Topical Analgesic, (BIOFREEZE EX) Apply 1 application topically as needed (back pain).  . midodrine (PROAMATINE) 10 MG tablet Take 1 tablet (10 mg total) by mouth Every Tuesday,Thursday,and Saturday with dialysis.  Marland Kitchen mirtazapine (REMERON) 15 MG tablet Take 7.5 mg by mouth at bedtime.  . mometasone (NASONEX) 50 MCG/ACT nasal spray Place 2 sprays into the nose as needed (congestion).   . multivitamin (RENA-VIT) TABS tablet Take 1 tablet by mouth at bedtime.   . Nutritional Supplements (FEEDING SUPPLEMENT, NEPRO CARB STEADY,) LIQD Take 237 mLs by mouth 2 (two) times daily between meals. (Patient taking differently: Take 237 mLs by mouth Every Tuesday,Thursday,and Saturday with dialysis. )  . RENVELA 800 MG tablet Take 800-1,600 mg by mouth See admin instructions. 800 mg three times daily with meals and 1,600 mg with snacks  . risperiDONE (RISPERDAL) 0.5 MG tablet Take 0.5 mg by mouth  at bedtime.  . risperiDONE (RISPERDAL) 1 MG tablet Take 1 mg by mouth at bedtime as needed.  . SENSIPAR 60 MG tablet Take 120 mg by mouth daily.      Allergies: Allergies as of 11/22/2017 - Review Complete 11/22/2017  Allergen Reaction Noted  . Nsaids Other (See Comments) 04/29/2008  . Latex Hives and Swelling 10/06/2015  . Tape Other (See Comments) 11/15/2016   Past Medical History:  Diagnosis Date  . Anemia, chronic disease    /notes 07/11/2017  . Arthritis   . Asthma   . Dementia   . DVT (deep venous thrombosis) (HCC)    LLE DVT 06/06/14  . ESRD (end stage renal disease) on dialysis (HCC)    "TTS; [facility]"  (07/11/2017)  . Gout   . Hyperlipemia   . Hypertension     Family History:  Family History  Problem Relation Age of Onset  . Colon cancer Neg Hx   . Diabetes Mellitus II Neg Hx      Social History:  Social History   Tobacco Use  . Smoking status: Never Smoker  . Smokeless tobacco: Current User    Types: Snuff  Substance Use Topics  . Alcohol use: No    Alcohol/week: 0.0 oz  . Drug use: No     Review of Systems: Unable to obtain due to patient's mental status.   Physical Exam: Blood pressure (!) 107/58, pulse 82, temperature (!) 95.9 F (35.5 C), temperature source Rectal, resp. rate 17, height 5' (1.524 m), weight 138 lb 0.1 oz (62.6 kg), SpO2 100 %. General: Elderly woman resting in bed comfortably, no acute distress Head: Normocephalic, mild facial ecchymosis (resolving) Eyes: Normal conjuctiva, unable to fc for extraocular movements  ENT: Moist mucus membranes  CV: RRR Resp: Clear anterior breath sounds, normal work of breathing, no distress  Abd: Soft, +BS, no apparent tenderness to palpation  Extr: Trace LE edema, RUE AVF with palpable thrill, safety mittens in place   Neuro: Alert, disoriented, mumbles periodically, not following commands  Skin: Warm, dry    CXR: personally reviewed my interpretation is low lung volumes,, no focal consolidation, slight blunting of costophrenic angle on L.   Assessment & Plan by Problem:  Hypotension, Hypothermia Pt presenting with hypothermia, along with decreased white count which is concerning for infection/sepsis. She does have a history of hypotension--on midodrine with HD. No obvious infection sources based on limited history and work-up thus far, urine and blood cultures pending. Fluid resuscitation difficult given her ESRD, has been covered with initial broad spectrum abx.  --Monitor vital signs, temp  --Narrow abx to Ceftriaxone  --F/u blood cultures, urine culture --Warming blanket    Altered Mental Status, H/o  Dementia Pt with altered mental status and recent functional decline in the setting of dementia. Her mental status has made HD difficult as pt has been attempting to pull out lines. On conversations with ED provider and IM team, the daughter has indicated at this point, that she does not wish to pursue further HD, but would like abx and other supportive care at this point. Attempted to introduce comfort care given decision with HD. Palliative care consulted and ongoing discussions will be helpful in establishing a more concrete plan.  --Palliative in am, f/u recommendations  --NPO, speech eval    ESRD on TThSa HD Due to confusion, pt has missed recent HD. As above, current decision is to discontinue further HD.   Dispo: Admit patient to Inpatient with expected length  of stay greater than 2 midnights.  Signed: Ginger Carne, MD 11/22/2017, 7:34 PM  Pager: [phone]

## 2017-11-22 NOTE — ED Notes (Signed)
Patient transported to CT 

## 2017-11-22 NOTE — ED Provider Notes (Addendum)
[facility] EMERGENCY DEPARTMENT Provider Note   CSN: [id number] Arrival date & time: 11/22/17  1149     History   Chief Complaint Chief Complaint  Patient presents with  . Altered Mental Status    HPI [name] is a 84 y.o. female. Level 5 caveat secondary to dementia HPI  84 year old female history of end-stage renal disease on dialysis Tuesday, Thursday, Saturday, history of dementia who has been living at home with her daughter.  She has been ambulatory until several days ago she stopped eating and has had increased weakness and poor balance.  She has had multiple falls at home and has been unable to stand on her own. [facility]. Past Medical History:  Diagnosis Date  . Anemia, chronic disease    /notes 07/11/2017  . Arthritis   . Asthma   . Dementia   . DVT (deep venous thrombosis) (HCC)    LLE DVT 06/06/14  . ESRD (end stage renal disease) on dialysis (HCC)    "TTS; [facility]" (07/11/2017)  . Gout   . Hyperlipemia   . Hypertension     Patient Active Problem List   Diagnosis Date Noted  . RSV (respiratory syncytial virus infection) 07/12/2017  . Acute bronchitis with asthma 07/11/2017  . Chronic orthostatic hypotension 11/15/2016  . HLD (hyperlipidemia) 11/15/2016  . Thrombocytopenia (HCC) 11/15/2016  . Colitis 08/10/2014  . HCAP (healthcare-associated pneumonia) 07/11/2014  . Oral thrush 07/08/2014  . Productive cough 07/05/2014  . Sepsis (HCC) 07/05/2014  . Sinus tachycardia 07/05/2014  . Pleural effusion on left 07/05/2014  . Anemia of renal disease 07/05/2014  . Leukocytosis 07/05/2014  . Palliative care encounter 06/16/2014  . Decreased appetite 06/16/2014  . Protein-calorie malnutrition, severe (HCC) 06/14/2014  . DVT (deep venous thrombosis) (HCC) 06/09/2014  . Anxiety state 06/09/2014  . Dementia with behavioral disturbance   . Arterial hypotension   . Hypotension 06/05/2014  . SOB (shortness of  breath) 06/05/2014  . UTI (lower urinary tract infection) 06/05/2014  . Diarrhea 06/05/2014  . Mechanical complication of other vascular device, implant, and graft 05/07/2013  . ESRD on dialysis (HCC) 05/07/2013  . Loss of weight 03/13/2013  . Hyperlipidemia 05/01/2009  . ALLERGIC RHINITIS 11/21/2007  . ACUTE LARYNGITIS, WITHOUT MENTION OF OBSTRUCTIO 07/18/2007  . GERD 05/21/2007  . GOUT 11/18/2006  . Essential hypertension 11/18/2006  . Asthma, chronic, unspecified asthma severity, with acute exacerbation 11/18/2006  . DIVERTICULOSIS, COLON 11/18/2006  . CARDIOMEGALY, MILD 02/08/1994    Past Surgical History:  Procedure Laterality Date  . AV FISTULA PLACEMENT  ~ 2010  . AV FISTULA PLACEMENT Right 10/09/2015   Procedure: INSERTION OF ARTERIOVENOUS (AV) GORE-TEX GRAFT RIGHT UPPER ARM;  Surgeon: Pryor Ochoa, MD;  Location: [facility] OR;  Service: Vascular;  Laterality: Right;  . CATARACT EXTRACTION W/ INTRAOCULAR LENS  IMPLANT, BILATERAL Bilateral   . INSERTION OF DIALYSIS CATHETER Right 08/30/2015   Procedure: INSERTION OF DIALYSIS CATHETER RIGHT INTERNAL JUGULAR ;  Surgeon: Fransisco Hertz, MD;  Location: [facility] OR;  Service: Vascular;  Laterality: Right;  . IR GENERIC HISTORICAL  09/14/2016   IR US GUIDE VASC ACCESS RIGHT 09/14/2016 [name], MD [facility]  . IR GENERIC HISTORICAL Right 09/14/2016   IR THROMBECTOMY AV FISTULA W/THROMBOLYSIS/PTA INC/SHUNT/IMG RIGHT 09/14/2016 [name], MD [facility]  . LIGATION OF ARTERIOVENOUS  FISTULA Left 08/30/2015   Procedure: LIGATION OF ARTERIOVENOUS  FISTULA  LEFT ARM;  Surgeon: Ottie Glazier  Imogene Burn, MD;  Location: [facility] OR;  Service: Vascular;  Laterality: Left;  . REVISON OF ARTERIOVENOUS FISTULA Left 08/28/2015   Procedure: REVISON OF ARTERIOVENOUS FISTULA;  Surgeon: Pryor Ochoa, MD;  Location: [facility] OR;  Service: Vascular;  Laterality: Left;  . TUBAL LIGATION       OB History   None      Home Medications    Prior to Admission medications   Medication  Sig Start Date End Date Taking? Authorizing Provider  albuterol (PROAIR HFA) 108 (90 Base) MCG/ACT inhaler Inhale 2 puffs into the lungs every 6 (six) hours. Patient not taking: Reported on 07/11/2017 11/16/16   Lonia Blood, MD  albuterol (PROVENTIL) (2.5 MG/3ML) 0.083% nebulizer solution Take 3 mLs by nebulization daily as needed for wheezing or shortness of breath. 07/13/17   Marlin Canary U, DO  ASPIRIN LOW DOSE 81 MG EC tablet Take 81 mg by mouth daily.  08/02/15   [provider]  donepezil (ARICEPT) 5 MG tablet Take 5 mg by mouth daily. 11/07/16   [provider]  guaiFENesin (MUCINEX) 600 MG 12 hr tablet Take 1 tablet (600 mg total) by mouth 2 (two) times daily. 07/13/17   Joseph Art, DO  ipratropium-albuterol (DUONEB) 0.5-2.5 (3) MG/3ML SOLN Take 3 mLs by nebulization every 6 (six) hours. 07/13/17   Joseph Art, DO  midodrine (PROAMATINE) 10 MG tablet Take 1 tablet (10 mg total) by mouth Every Tuesday,Thursday,and Saturday with dialysis. 11/17/16   Lonia Blood, MD  mometasone (NASONEX) 50 MCG/ACT nasal spray Place 2 sprays into the nose at bedtime. 11/07/16   [provider]  multivitamin (RENA-VIT) TABS tablet Take 1 tablet by mouth at bedtime.     [provider]  nitroGLYCERIN (NITROSTAT) 0.4 MG SL tablet Place 0.4 mg under the tongue every 5 (five) minutes as needed for chest pain.    [provider]  Nutritional Supplements (FEEDING SUPPLEMENT, NEPRO CARB STEADY,) LIQD Take 237 mLs by mouth 2 (two) times daily between meals. Patient taking differently: Take 237 mLs by mouth Every Tuesday,Thursday,and Saturday with dialysis.  06/09/14   Dellinger, Tora Kindred, PA-C  predniSONE (DELTASONE) 20 MG tablet Take 2 tablets (40 mg total) by mouth daily with breakfast. 07/14/17   Joseph Art, DO  RENVELA 800 MG tablet Take 800-1,600 mg by mouth See admin instructions. 800 mg three times daily with meals and 1,600 mg with snacks 06/21/15    [provider]  risperiDONE (RISPERDAL) 0.5 MG tablet Take 0.5 mg by mouth at bedtime. 11/07/16   [provider]  SENSIPAR 60 MG tablet Take 60 mg by mouth daily.  07/02/15   [provider]    Family History Family History  Problem Relation Age of Onset  . Colon cancer Neg Hx   . Diabetes Mellitus II Neg Hx     Social History Social History   Tobacco Use  . Smoking status: Never Smoker  . Smokeless tobacco: Current User    Types: Snuff  Substance Use Topics  . Alcohol use: No    Alcohol/week: 0.0 oz  . Drug use: No     Allergies   Nsaids; Latex; and Tape   Review of Systems Review of Systems  All other systems reviewed and are negative.    Physical Exam Updated Vital Signs BP (!) 104/59 (BP Location: Left Arm)   Pulse 80   Resp 17   SpO2 100%   Physical Exam  Constitutional: She appears  well-developed and well-nourished. She appears distressed.  HENT:  Head: Normocephalic and atraumatic.  Right Ear: External ear normal.  Left Ear: External ear normal.  Mucous membranes are dry Contusion noted on left cheek  Eyes:  Conjunctival are pale  Neck: Normal range of motion. Neck supple.  Cardiovascular: Normal rate.  Contusion noted on chest wall  Pulmonary/Chest: Effort normal and breath sounds normal.  Abdominal: Soft.  Vision appears to have diffuse pain with palpation  Musculoskeletal: Normal range of motion.  Dialysis site right upper arm  Skin: Capillary refill takes less than 2 seconds.  Skin appears dry  Nursing note and vitals reviewed.    ED Treatments / Results  Labs (all labs ordered are listed, but only abnormal results are displayed) Labs Reviewed  CBC - Abnormal; Notable for the following components:      Result Value   WBC 3.9 (*)    RBC 3.43 (*)    Hemoglobin 10.5 (*)    HCT 33.8 (*)    RDW 16.7 (*)    Platelets 145 (*)    All other components within normal limits  CBG MONITORING, ED - Abnormal;  Notable for the following components:   Glucose-Capillary 108 (*)    All other components within normal limits  COMPREHENSIVE METABOLIC PANEL    EKG EKG Interpretation  Date/Time:  Wednesday Nov 22 2017 16:43:27 EDT Ventricular Rate:  80 PR Interval:  186 QRS Duration: 96 QT Interval:  452 QTC Calculation: 521 R Axis:   -48 Text Interpretation:  Sinus rhythm with marked sinus arrhythmia Pulmonary disease pattern Incomplete right bundle branch block Left anterior fascicular block Cannot rule out Inferior infarct (masked by fascicular block?) , age undetermined ST & T wave abnormality, consider anterolateral ischemia Abnormal ECG Confirmed by Margarita Grizzle [phone]) on 11/22/2017 4:50:28 PM   Radiology Dg Chest Port 1 View  Result Date: 11/22/2017 CLINICAL DATA:  Cough, weakness EXAM: PORTABLE CHEST 1 VIEW COMPARISON:  Chest x-Kalep Full of 09/29/2016 FINDINGS: There is bibasilar linear atelectasis or scarring present. No pneumonia or pleural effusion is seen. Mediastinal and hilar contours are unremarkable and mild cardiomegaly is stable. There are degenerative changes noted in the right shoulder. A vascular graft overlies the right axilla. IMPRESSION: Stable mild cardiomegaly. Stable mild basilar atelectasis or scarring. Electronically Signed   By: Dwyane Dee M.D.   On: 11/22/2017 13:45    Procedures Procedures (including critical care time)  Medications Ordered in ED Medications - No data to display   Initial Impression / Assessment and Plan / ED Course  I have reviewed the triage vital signs and the nursing notes.  Pertinent labs & imaging results that were available during my care of the patient were reviewed by me and considered in my medical decision making (see chart for details).     84 year old female end-stage renal disease with dementia presents today with several days of increasing confusion.  History is obtained by her daughter who holds the power of attorney.  She states  that the patient was too confused to have dialysis and she had to hold her down the last time.  This confusion has worsened and she has become weaker over the past several days.  The daughter reports several falls due to her increased weakness.  Patient unable to answer questions.  She has a contusion to her left face and her chest. Patient care discussed with [name] from [facility]. I also discussed with daughter again.  Daughter again confirms that she wishes  the IV antibiotics, but no intubation, compressions, or ongoing dialysis.   Large leukocytes noted on urinalysis and culture pending.  Patient covered with antibiotics for possible infection as source, but do not planned fluid bolusing as patient would rapidly become volume overloaded.  She is being gently hydrated here in the ED. I discussed palliative care with the patient daughter and she is agreeable.  Plan admission to internal medicine service and will consult palliative care 1-altered mental status- ?secondary to infection with underlying baseline dementia.   2-?infection-  Hypothermia, borderline hypotension,leukopenia. cxr no acute infiltrate, Some abdominal ttp- ct aabdomen reveals  No acute abnormalities-  Lactate not obtained as do not plan volume resuscitation, pressors, or intubation  3-esrd- patient has been on dialysis but did not dialyze yesterday.  Daughter voices to stop dialysis 4- hyperglycemia -new onset dm- no history noted last d/c summary 07/11/17 no diabetic meds noted  CRITICAL CARE Performed by: Margarita Grizzle Total critical care time: 45 minutes Critical care time was exclusive of separately billable procedures and treating other patients. Critical care was necessary to treat or prevent imminent or life-threatening deterioration. Critical care was time spent personally by me on the following activities: development of treatment plan with patient and/or surrogate as well as nursing, discussions with consultants,  evaluation of patient's response to treatment, examination of patient, obtaining history from patient or surrogate, ordering and performing treatments and interventions, ordering and review of laboratory studies, ordering and review of radiographic studies, pulse oximetry and re-evaluation of patient's condition.   Plan palliative care consult.  Discussed with Dr. Linna Darner and they will see in consultation tomorrow Final Clinical Impressions(s) / ED Diagnoses   Final diagnoses:  Altered mental status, unspecified altered mental status type  ESRD (end stage renal disease) (HCC)  Dementia without behavioral disturbance, unspecified dementia type    ED Discharge Orders    None       Margarita Grizzle, MD 11/22/17 1840    Margarita Grizzle, MD 11/22/17 [phone]

## 2017-11-22 NOTE — ED Triage Notes (Signed)
Pt presents with 3 day h/o altered mental status.  Pt coming from home, family reports symptoms worse this morning, unable to stand on her own (having multiple falls); CBG: 42, [facility] gave approximately 12.5g of D10 IV solution.  Unsure of baseline-no family present.  Initial BP: 83/53; pt has HD, unsure when last treatment was.

## 2017-11-23 ENCOUNTER — Other Ambulatory Visit: Payer: Self-pay

## 2017-11-23 DIAGNOSIS — Z992 Dependence on renal dialysis: Secondary | ICD-10-CM

## 2017-11-23 DIAGNOSIS — Z91048 Other nonmedicinal substance allergy status: Secondary | ICD-10-CM

## 2017-11-23 DIAGNOSIS — Z9181 History of falling: Secondary | ICD-10-CM

## 2017-11-23 DIAGNOSIS — R680 Hypothermia, not associated with low environmental temperature: Secondary | ICD-10-CM

## 2017-11-23 DIAGNOSIS — Z886 Allergy status to analgesic agent status: Secondary | ICD-10-CM

## 2017-11-23 DIAGNOSIS — F17220 Nicotine dependence, chewing tobacco, uncomplicated: Secondary | ICD-10-CM

## 2017-11-23 DIAGNOSIS — E785 Hyperlipidemia, unspecified: Secondary | ICD-10-CM

## 2017-11-23 DIAGNOSIS — Z7189 Other specified counseling: Secondary | ICD-10-CM

## 2017-11-23 DIAGNOSIS — J45909 Unspecified asthma, uncomplicated: Secondary | ICD-10-CM

## 2017-11-23 DIAGNOSIS — Z86718 Personal history of other venous thrombosis and embolism: Secondary | ICD-10-CM

## 2017-11-23 DIAGNOSIS — Z515 Encounter for palliative care: Secondary | ICD-10-CM

## 2017-11-23 DIAGNOSIS — G9340 Encephalopathy, unspecified: Secondary | ICD-10-CM

## 2017-11-23 DIAGNOSIS — M109 Gout, unspecified: Secondary | ICD-10-CM

## 2017-11-23 DIAGNOSIS — N186 End stage renal disease: Secondary | ICD-10-CM

## 2017-11-23 DIAGNOSIS — E162 Hypoglycemia, unspecified: Secondary | ICD-10-CM

## 2017-11-23 DIAGNOSIS — I959 Hypotension, unspecified: Secondary | ICD-10-CM

## 2017-11-23 DIAGNOSIS — F0390 Unspecified dementia without behavioral disturbance: Secondary | ICD-10-CM

## 2017-11-23 DIAGNOSIS — Z91040 Latex allergy status: Secondary | ICD-10-CM

## 2017-11-23 DIAGNOSIS — R4182 Altered mental status, unspecified: Secondary | ICD-10-CM

## 2017-11-23 LAB — CBG MONITORING, ED
GLUCOSE-CAPILLARY: 107 mg/dL — AB (ref 65–99)
GLUCOSE-CAPILLARY: 112 mg/dL — AB (ref 65–99)
GLUCOSE-CAPILLARY: 133 mg/dL — AB (ref 65–99)
GLUCOSE-CAPILLARY: 166 mg/dL — AB (ref 65–99)
GLUCOSE-CAPILLARY: 44 mg/dL — AB (ref 65–99)
Glucose-Capillary: 108 mg/dL — ABNORMAL HIGH (ref 65–99)
Glucose-Capillary: 54 mg/dL — ABNORMAL LOW (ref 65–99)
Glucose-Capillary: 57 mg/dL — ABNORMAL LOW (ref 65–99)
Glucose-Capillary: 57 mg/dL — ABNORMAL LOW (ref 65–99)
Glucose-Capillary: 74 mg/dL (ref 65–99)
Glucose-Capillary: 92 mg/dL (ref 65–99)
Glucose-Capillary: <10 mg/dL — CL (ref 65–99)

## 2017-11-23 LAB — COMPREHENSIVE METABOLIC PANEL
ALBUMIN: 2.5 g/dL — AB (ref 3.5–5.0)
ALT: 15 U/L (ref 14–54)
AST: 25 U/L (ref 15–41)
Alkaline Phosphatase: 44 U/L (ref 38–126)
Anion gap: 16 — ABNORMAL HIGH (ref 5–15)
BUN: 29 mg/dL — AB (ref 6–20)
CO2: 22 mmol/L (ref 22–32)
Calcium: 12.1 mg/dL — ABNORMAL HIGH (ref 8.9–10.3)
Chloride: 105 mmol/L (ref 101–111)
Creatinine, Ser: 10.74 mg/dL — ABNORMAL HIGH (ref 0.44–1.00)
GFR calc Af Amer: 3 mL/min — ABNORMAL LOW (ref >60)
GFR, EST NON AFRICAN AMERICAN: 3 mL/min — AB (ref >60)
GLUCOSE: 71 mg/dL (ref 65–99)
POTASSIUM: 5 mmol/L (ref 3.5–5.1)
Sodium: 143 mmol/L (ref 135–145)
TOTAL PROTEIN: 4.7 g/dL — AB (ref 6.5–8.1)
Total Bilirubin: 1 mg/dL (ref 0.3–1.2)

## 2017-11-23 LAB — URINE CULTURE

## 2017-11-23 MED ORDER — DEXTROSE 50 % IV SOLN: 1.0000 | Freq: Once | INTRAVENOUS | Status: DC

## 2017-11-23 MED ORDER — DEXTROSE 50 % IV SOLN
INTRAVENOUS | Status: AC
  Administered 2017-11-23: 50 mL
  Filled 2017-11-23: qty 50

## 2017-11-23 MED ORDER — SODIUM CHLORIDE 0.9 % IV BOLUS: 1000.0000 mL | Freq: Once | INTRAVENOUS | Status: DC

## 2017-11-23 MED ORDER — DEXTROSE 10 % IV SOLN
INTRAVENOUS | Status: DC
  Administered 2017-11-23: 05:00:00 via INTRAVENOUS

## 2017-11-23 MED ORDER — DEXTROSE 50 % IV SOLN
INTRAVENOUS | Status: AC
  Filled 2017-11-23: qty 50

## 2017-11-23 MED ORDER — LORAZEPAM 2 MG/ML PO CONC: 0.5000 mg | ORAL | 0 refills | Status: AC | PRN

## 2017-11-23 MED ORDER — DEXTROSE 50 % IV SOLN
INTRAVENOUS | Status: AC
  Administered 2017-11-23: 07:00:00
  Filled 2017-11-23: qty 50

## 2017-11-23 NOTE — Progress Notes (Signed)
Spoke with patient's daughter. Daughter says her mother "can't take dialysis anymore". Daughter wishes to stop hemodialysis and pursue full comfort care. She is being discharged today with [facility] and hosp[facility]. Emotional support to daughter.   Alonna Buckler NP-C [facility] [phone] (Pager)

## 2017-11-23 NOTE — Progress Notes (Signed)
CSW spoke with daughter at bedside to confirm plans for transportation of pt. CSW offered support to daughter as daughter was very tearful. CSW has arranged for transport bach to residence at this time. There are no further CSW needs. CSW will sign off.    Claude Manges. Jonathin Heinicke, MSW, LCSW-A Emergency Department Clinical Social Worker [phone]

## 2017-11-23 NOTE — Progress Notes (Addendum)
11:26am- CSW received call Shawna Orleans with Palliative informing CSW that pt is going to be discharged back home with [facility] to render remaining care services. At this time CSW was informed that Shawna Orleans has spoken with Dewayne Hatch and both are agreeable to plan. At this time there are no further CSW needs. CSW will sign off.    CSW received call from Ann with [facility] [phone] informing CSW that pt is set up with this services. Ann expressed that if pt is discharged home then [facility] would be able to provide the services for pt at home. CSW will continue to follow for nay further needs.   Claude Manges Arlette Schaad, MSW, LCSW-A Emergency Department Clinical Social Worker [phone]

## 2017-11-23 NOTE — ED Notes (Signed)
Per Admitting MD, will continue to monitor CBG closely with D50 as necessary.

## 2017-11-23 NOTE — ED Notes (Signed)
Waiting on PTAR at this time. Pt resting comfortably, family at bedside.

## 2017-11-23 NOTE — Progress Notes (Signed)
Palliative Medicine RN Note: Rec'd a call from [facility] [phone]) regarding d/c plan and clarification on if patient will be admitted. I spoke with the IMTS; patient will be d/c asap, as [facility] is ready to go. We discussed Ativan for comfort, and IMTS will provide this rx at d/c; [facility] will address other symptoms at home.  I spoke with Reece Packer [facility] and Montel Clock (SW in ED) to update.  Margret Chance Alek Poncedeleon, RN, [name], [facility] Palliative Medicine Team 11/23/2017 11:26 AM Office [phone]

## 2017-11-23 NOTE — Discharge Instructions (Addendum)
It was an honor to take care of you, [name]. We are sending you home to continue keeping you comfortable at home with help from [facility].

## 2017-11-23 NOTE — Progress Notes (Signed)
Paged to evaluated pt due to low blood pressures with 80s systolics and appearing more somnolent. On evaluation, pt laying down and appeared to be having difficulty managing oral secretions. Head of bed was elevated and oropharynx suctioned with improvement in respiratory status. Minimally awakened to some stimulation, subsequent cbg showed hypoglycemia, D50 ordered.   Pt appears to be further declining given worsening blood pressures despite fluid (both small volume boluses and IV abx administration) and empiric antibiotics. Daughter (noted as HCPOA per ED documentation and conversations) attempted to be contacted for updates and further discussion, unable to reach, voice message left and will continue to attempt to contact. Son at bedside and updated, though not primary decision maker and was unaware of details of prior conversations.

## 2017-11-23 NOTE — ED Notes (Signed)
Checked patient blood sugar it was 57 notified RN Autrey of blood sugar patient is resting with call bell in reach and family at bedside

## 2017-11-23 NOTE — Consult Note (Signed)
Consultation Note Date: 11/23/2017   Patient Name: [name]  DOB: 1933-04-29  MRN: [id number]  Age / Sex: 84 y.o., female  PCP: Fleet Contras, MD Referring Physician: Tyson Alias, *  Reason for Consultation: Establishing goals of care  HPI/Patient Profile: 84 y.o. female  with past medical history of dementia, ESRD on HD since the past 4 years admitted on 11/22/2017 with increased weakness, poor appetite.   Clinical Assessment and Goals of Care: 84 year old woman with a past medical history significant for dementia, end-stage renal disease on dialysis, gout, hypotension, asthma, dyslipidemia, history of deep vein thrombosis, lives at home with her daughter, admitted with subacute decline in functional status and mental status, poor oral intake, increasing fatigue, inability to tolerate dialysis sessions.  Patient has been admitted to the hospital teaching service with hypotension, hypothermia, altered mental status with underlying dementia, end-stage renal disease,possible source of infection/sepsis. At the time of admission, patient's daughter had discussions with admitting service as well as emergency department staff about the burden of continuing with dialysis and also expressed interest in discussing with palliative care about goals of care discussions.  Patient is a weak appearing elderly lady resting in bed. She has mittens on both her hands. Daughter [name] is present at the bedside. I introduced myself and palliative care as follows: Palliative medicine is specialized medical care for people living with serious illness. It focuses on providing relief from the symptoms and stress of a serious illness. The goal is to improve quality of life for both the patient and the family.  We reviewed the patient's life history. We reviewed the patient's pertinent medical conditions. Patient has  been declining for the past few weeks. We discussed about comfort care pathway - hospice philosophy of care. Goals wishes and values elicited. See additional discussions/recommendations below.    NEXT OF KIN  daughter [name] is primary caregiver, main decision maker for the patient.  There are no formal completed HCPOA paperwork, how ever, patient has been living with daughter [name] since the past 15 years.  Patient has 11 children.   Additional Discussions/SUMMARY OF RECOMMENDATIONS    1. Re discussed DNR DNI and no further dialysis with daughter.  2. Discussed also about comfort measures, introduced hospice philosophy of care, discussed about residential hospice. Patient's daughter is accepting of hospice services, how ever, wants to continue to care for the patient at home.  3. [facility]. Called [facility], left voicemail with her PCP [name], NP informing her of the patient's daughter's decisions to take patient home with comfort measures.  4. Prognosis less than 2 weeks, discussed frankly and compassionately with daughter [name] at bedside, she is tearful but understands. "She don't need no dialysis anymore, she been through enough. She wouldn't want none of this." End of life signs and symptoms as it relates to end stage renal disease, cessation of dialysis, possible underlying infection, worsening dementia all discussed with daughter. All of her questions answered to the best of  my ability.   Thank you for the consult. Care manager consult has also been requested for assisting with comfort care/hospice arrangements through the [facility].    Code Status/Advance Care Planning:  DNR    Symptom Management:    as above   Palliative Prophylaxis:   Delirium Protocol  Additional Recommendations (Limitations, Scope, Preferences):  Full Comfort Care  Psycho-social/Spiritual:   Desire for further Chaplaincy support:yes  Additional  Recommendations: Education on Hospice  Prognosis:   < 2 weeks  Discharge Planning: Home with Hospice      Primary Diagnoses: Present on Admission: . Altered mental status . Hypotension . Dementia with behavioral disturbance   I have reviewed the medical record, interviewed the patient and family, and examined the patient. The following aspects are pertinent.  Past Medical History:  Diagnosis Date  . Anemia, chronic disease    /notes 07/11/2017  . Arthritis   . Asthma   . Dementia   . DVT (deep venous thrombosis) (HCC)    LLE DVT 06/06/14  . ESRD (end stage renal disease) on dialysis (HCC)    "TTS; [facility]" (07/11/2017)  . Gout   . Hyperlipemia   . Hypertension    Social History   Socioeconomic History  . Marital status: Divorced    Spouse name: Not on file  . Number of children: Not on file  . Years of education: Not on file  . Highest education level: Not on file  Occupational History  . Not on file  Social Needs  . Financial resource strain: Not on file  . Food insecurity:    Worry: Not on file    Inability: Not on file  . Transportation needs:    Medical: Not on file    Non-medical: Not on file  Tobacco Use  . Smoking status: Never Smoker  . Smokeless tobacco: Current User    Types: Snuff  Substance and Sexual Activity  . Alcohol use: No    Alcohol/week: 0.0 oz  . Drug use: No  . Sexual activity: Not on file  Lifestyle  . Physical activity:    Days per week: Not on file    Minutes per session: Not on file  . Stress: Not on file  Relationships  . Social connections:    Talks on phone: Not on file    Gets together: Not on file    Attends religious service: Not on file    Active member of club or organization: Not on file    Attends meetings of clubs or organizations: Not on file    Relationship status: Not on file  Other Topics Concern  . Not on file  Social History Narrative  . Not on file   Family History  Problem Relation Age of Onset    . Colon cancer Neg Hx   . Diabetes Mellitus II Neg Hx    Scheduled Meds: . dextrose  1 ampule Intravenous Once  . dextrose  1 ampule Intravenous Once   Continuous Infusions: . cefTRIAXone (ROCEPHIN)  IV Stopped (11/22/17 2352)  . sodium chloride     PRN Meds:.acetaminophen **OR** acetaminophen Medications Prior to Admission:  Prior to Admission medications   Medication Sig Start Date End Date Taking? Authorizing Provider  acetaminophen (TYLENOL) 500 MG tablet Take 1,000 mg by mouth every 12 (twelve) hours as needed for mild pain or headache.   Yes [provider]  albuterol (PROVENTIL) (2.5 MG/3ML) 0.083% nebulizer solution Take 3 mLs by nebulization daily as needed  for wheezing or shortness of breath. 07/13/17  Yes Vann, Jessica U, DO  ASPIRIN LOW DOSE 81 MG EC tablet Take 81 mg by mouth daily.  08/02/15  Yes [provider]  donepezil (ARICEPT) 10 MG tablet Take 10 mg by mouth at bedtime.  11/07/16  Yes [provider]  Melatonin 5 MG TABS Take 5 mg by mouth at bedtime.   Yes [provider]  Menthol, Topical Analgesic, (BIOFREEZE EX) Apply 1 application topically as needed (back pain).   Yes [provider]  midodrine (PROAMATINE) 10 MG tablet Take 1 tablet (10 mg total) by mouth Every Tuesday,Thursday,and Saturday with dialysis. 11/17/16  Yes Lonia Blood, MD  mirtazapine (REMERON) 15 MG tablet Take 7.5 mg by mouth at bedtime.   Yes [provider]  mometasone (NASONEX) 50 MCG/ACT nasal spray Place 2 sprays into the nose as needed (congestion).  11/07/16  Yes [provider]  multivitamin (RENA-VIT) TABS tablet Take 1 tablet by mouth at bedtime.    Yes [provider]  Nutritional Supplements (FEEDING SUPPLEMENT, NEPRO CARB STEADY,) LIQD Take 237 mLs by mouth 2 (two) times daily between meals. Patient taking differently: Take 237 mLs by mouth Every Tuesday,Thursday,and Saturday with dialysis.  06/09/14  Yes  Dellinger, Tora Kindred, PA-C  RENVELA 800 MG tablet Take 800-1,600 mg by mouth See admin instructions. 800 mg three times daily with meals and 1,600 mg with snacks 06/21/15  Yes [provider]  risperiDONE (RISPERDAL) 0.5 MG tablet Take 0.5 mg by mouth at bedtime. 11/07/16  Yes [provider]  risperiDONE (RISPERDAL) 1 MG tablet Take 1 mg by mouth at bedtime as needed. 08/27/17  Yes [provider]  SENSIPAR 60 MG tablet Take 120 mg by mouth daily.  07/02/15  Yes [provider]  albuterol (PROAIR HFA) 108 (90 Base) MCG/ACT inhaler Inhale 2 puffs into the lungs every 6 (six) hours. Patient not taking: Reported on 07/11/2017 11/16/16   Lonia Blood, MD  guaiFENesin (MUCINEX) 600 MG 12 hr tablet Take 1 tablet (600 mg total) by mouth 2 (two) times daily. Patient not taking: Reported on 11/22/2017 07/13/17   Joseph Art, DO  ipratropium-albuterol (DUONEB) 0.5-2.5 (3) MG/3ML SOLN Take 3 mLs by nebulization every 6 (six) hours. Patient not taking: Reported on 11/22/2017 07/13/17   Joseph Art, DO  nitroGLYCERIN (NITROSTAT) 0.4 MG SL tablet Place 0.4 mg under the tongue every 5 (five) minutes as needed for chest pain.    [provider]   Allergies  Allergen Reactions  . Nsaids Other (See Comments)    REACTION: Avoid NSAIDS (renal failure)  . Latex Hives and Swelling  . Tape Other (See Comments)    SKIN IS VERY THIN AND TEARS EASILY   Review of Systems Confused Mumbles incoherently  Physical Exam Weak elderly lady Wearing mittens both hands Shallow clear breath sounds S1 S2 Abdomen soft Has trace edema Doesn't follow commands Does not appear to be in distress currently.   Vital Signs: BP (!) 97/50   Pulse 84   Temp (!) 95.9 F (35.5 C) (Rectal)   Resp 17   Ht 5' (1.524 m)   Wt 62.6 kg (138 lb 0.1 oz)   SpO2 100%   BMI 26.95 kg/m  Pain Scale: FLACC   Pain Score: Asleep   SpO2: SpO2: 100 % O2 Device:SpO2: 100 % O2 Flow Rate: .    IO: Intake/output summary:   Intake/Output Summary (Last 24 hours) at 11/23/2017  1610 Last data filed at 11/22/2017 2352 Gross per 24 hour  Intake 2050 ml  Output -  Net 2050 ml    LBM:   Baseline Weight: Weight: 62.6 kg (138 lb 0.1 oz) Most recent weight: Weight: 62.6 kg (138 lb 0.1 oz)     Palliative Assessment/Data:   PPS 20%  Time In:  7.30 Time Out:  8.40 Time Total:  70 min  Greater than 50%  of this time was spent counseling and coordinating care related to the above assessment and plan.  Signed by: Rosalin Hawking, MD  [phone]  Please contact Palliative Medicine Team phone at [phone] for questions and concerns.  For individual provider: See Loretha Stapler

## 2017-11-23 NOTE — Progress Notes (Signed)
   Subjective:  [name] was seen this morning lying in bed. She had soft mittens on. She mumbled her response when asked questions. Also discussed with Palliative Medicine team at bedside, who have spoken with the family, who have chosen to pursue comfort measures. Family would like for the patient to be at home, [facility] to assist with home hospice services.  Objective:  Vital signs in last 24 hours: Vitals:   11/23/17 0400 11/23/17 0430 11/23/17 0500 11/23/17 0530  BP: 98/82 (!) 87/50 (!) 84/47 (!) 80/49  Pulse:  76 80 82  Resp: Temp:      TempSrc:      SpO2:  100% 100% 100%  Weight:      Height:       GEN: Elderly female resting in bed, intermittent mumbling CV: NR & RR RESP: No increased work of breathing, anterior lung fields clear EXT: No LE edema, safety mittens in place, RUE AVF NEURO: Alert, intermittently mumbling.  Assessment/Plan:  Principal Problem:   Altered mental status Active Problems:   ESRD on dialysis (HCC)   Hypotension   Dementia with behavioral disturbance  Altered Mental Status, H/o Dementia Hypotension, Hypothermia, Hypoglycemia Initial presentation concerning for infection/sepsis. No obvious source was found. She did require several doses of D50 in order to maintain her CBG. After further discussion with Palliative Medicine team and family, family has decided to pursue comfort measures, discontinue HD, and would like patient to be at home. [facility] will assist with home hospice services. - Appreciate assistance from Palliative and SW with this transition - D/c fluids, telemetry, and CBG checks - D/c antibiotics - Ativan PRN for agitation/anxiety - D/c today to home with assistance from [facility]  ESRD, dependent on HD Due to confusion, pt has missed recent HD. As above, current decision is to discontinue further HD.  Dispo: Anticipated discharge today.   Scherrie Gerlach, MD 11/23/2017, 6:13 AM Pager: Demetrius Charity  [phone]

## 2017-11-23 NOTE — Discharge Summary (Signed)
Name: [name] MRN: [id number] DOB: 1933-03-18 84 y.o. PCP: Fleet Contras, MD  Date of Admission: 11/22/2017 11:49 AM Date of Discharge: 11/23/2017 Attending Physician: Tyson Alias, *  Discharge Diagnosis: 1. Altered mental status 2. ESRD  Principal Problem:   Altered mental status Active Problems:   ESRD on dialysis (HCC)   Hypotension   Dementia without behavioral disturbance   Goals of care, counseling/discussion   Discharge Medications: Allergies as of 11/23/2017      Reactions   Nsaids Other (See Comments)   REACTION: Avoid NSAIDS (renal failure)   Latex Hives, Swelling   Tape Other (See Comments)   SKIN IS VERY THIN AND TEARS EASILY      Medication List    STOP taking these medications   acetaminophen 500 MG tablet Commonly known as:  TYLENOL   albuterol (2.5 MG/3ML) 0.083% nebulizer solution Commonly known as:  PROVENTIL   albuterol 108 (90 Base) MCG/ACT inhaler Commonly known as:  PROAIR HFA   ASPIRIN LOW DOSE 81 MG EC tablet Generic drug:  aspirin   BIOFREEZE EX   donepezil 10 MG tablet Commonly known as:  ARICEPT   feeding supplement (NEPRO CARB STEADY) Liqd   guaiFENesin 600 MG 12 hr tablet Commonly known as:  MUCINEX   ipratropium-albuterol 0.5-2.5 (3) MG/3ML Soln Commonly known as:  DUONEB   Melatonin 5 MG Tabs   midodrine 10 MG tablet Commonly known as:  PROAMATINE   mirtazapine 15 MG tablet Commonly known as:  REMERON   mometasone 50 MCG/ACT nasal spray Commonly known as:  NASONEX   multivitamin Tabs tablet   nitroGLYCERIN 0.4 MG SL tablet Commonly known as:  NITROSTAT   RENVELA 800 MG tablet Generic drug:  sevelamer carbonate   risperiDONE 0.5 MG tablet Commonly known as:  RISPERDAL   risperiDONE 1 MG tablet Commonly known as:  RISPERDAL   SENSIPAR 60 MG tablet Generic drug:  cinacalcet     TAKE these medications   LORazepam 2 MG/ML concentrated solution Commonly known as:  ATIVAN Take 0.3  mLs (0.6 mg total) by mouth every 4 (four) hours as needed for anxiety (agitation).      Disposition and follow-up:   [name] was discharged from [facility] in Fair condition.  At the hospital follow up visit please address:  1.  Goals of care - After discussion with family and patient's PCP, decision has been made to proceed with full comfort care. Patient prefers to be at home with assistance from [facility]. Please assist with keeping her comfortable as needed. Ativan PRN is available for agitation/anxiety.  2.  Labs / imaging needed at time of follow-up: None  3.  Pending labs/ test needing follow-up: None  Follow-up Appointments:   Hospital Course by problem list: Principal Problem:   Altered mental status Active Problems:   ESRD on dialysis (HCC)   Hypotension   Dementia without behavioral disturbance   Goals of care, counseling/discussion   1. Altered mental status Hypotension, Hypothermia ESRD, dependent on HD Patient admitted for altered mental status and recent functional decline in the setting of dementia. This has made it difficult for her to complete HD, as she attempts to pull out her lines. She initially received IV fluids and IV antibiotics for possible infection, however through conversations with Palliative Medicine team, patient's daughter, and patient's primary doctors, the decision was made to withhold further hemodialysis and to pursue full comfort care. Patient's wishes are to be at home,  which has been set up with assistance from [facility]. Patient was discharged with Ativan PRN for agitation/anxiety.  Discharge Vitals:   BP (!) 95/56   Pulse 83   Temp (!) 95.9 F (35.5 C) (Rectal)   Resp 15   Ht 5' (1.524 m)   Wt 138 lb 0.1 oz (62.6 kg)   SpO2 100%   BMI 26.95 kg/m   Pertinent Labs, Studies, and Procedures:  CBC Latest Ref Rng & Units 11/22/2017 07/13/2017 07/11/2017  WBC 4.0 - 10.5 K/uL 3.9(L) 13.5(H) 7.6  Hemoglobin 12.0 - 15.0  g/dL 10.5(L) 10.4(L) 11.8(L)  Hematocrit 36.0 - 46.0 % 33.8(L) 31.7(L) 37.2  Platelets 150 - 400 K/uL 145(L) 166 202   CMP Latest Ref Rng & Units 11/23/2017 11/22/2017 07/13/2017  Glucose 65 - 99 mg/dL 71 478(G) 956(O)  BUN 6 - 20 mg/dL 13(Y) 86(V) 78(I)  Creatinine 0.44 - 1.00 mg/dL 69.62(X) 5.28(U) 1.32(G)  Sodium 135 - 145 mmol/L 143 140 132(L)  Potassium 3.5 - 5.1 mmol/L 5.0 4.4 4.2  Chloride 101 - 111 mmol/L 105 99(L) 97(L)  CO2 22 - 32 mmol/L 22 26 21(L)  Calcium 8.9 - 10.3 mg/dL 12.1(H) 12.7(H) 8.7(L)  Total Protein 6.5 - 8.1 g/dL 4.7(L) 5.8(L) -  Total Bilirubin 0.3 - 1.2 mg/dL 1.0 0.8 -  Alkaline Phos 38 - 126 U/L 44 51 -  AST 15 - 41 U/L 25 27 -  ALT 14 - 54 U/L 15 16 -   Discharge Instructions: Discharge Instructions    Discharge instructions   Complete by:  As directed    It was an honor to take care of you, Ms. Parham. We are sending you home to continue keeping you comfortable at home with help from [facility].     Signed: Scherrie Gerlach, MD 11/23/2017, 11:25 AM   Pager: Demetrius Charity [phone]

## 2017-11-27 LAB — CULTURE, BLOOD (ROUTINE X 2)
CULTURE: NO GROWTH
Culture: NO GROWTH
Special Requests: ADEQUATE
Special Requests: ADEQUATE

## 2017-12-09 DEATH — deceased
